# Patient Record
Sex: Male | Born: 1965
Health system: Southern US, Community
[De-identification: ages and names within clinical notes are randomized; demographics above are authoritative.]

## PROBLEM LIST (undated history)

## (undated) DIAGNOSIS — C8190 Hodgkin lymphoma, unspecified, unspecified site: Secondary | ICD-10-CM

## (undated) DIAGNOSIS — I2510 Atherosclerotic heart disease of native coronary artery without angina pectoris: Secondary | ICD-10-CM

## (undated) DIAGNOSIS — E785 Hyperlipidemia, unspecified: Secondary | ICD-10-CM

## (undated) DIAGNOSIS — I493 Ventricular premature depolarization: Secondary | ICD-10-CM

## (undated) DIAGNOSIS — H3530 Unspecified macular degeneration: Secondary | ICD-10-CM

## (undated) HISTORY — DX: Unspecified macular degeneration: H35.30

## (undated) HISTORY — DX: Ventricular premature depolarization: I49.3

## (undated) HISTORY — DX: Hyperlipidemia, unspecified: E78.5

## (undated) HISTORY — DX: Hodgkin lymphoma, unspecified, unspecified site: C81.90

## (undated) HISTORY — DX: Atherosclerotic heart disease of native coronary artery without angina pectoris: I25.10

---

## 1995-07-13 DIAGNOSIS — Z8571 Personal history of Hodgkin lymphoma: Secondary | ICD-10-CM | POA: Insufficient documentation

## 1995-07-13 DIAGNOSIS — C8190 Hodgkin lymphoma, unspecified, unspecified site: Secondary | ICD-10-CM

## 1995-07-13 HISTORY — DX: Hodgkin lymphoma, unspecified, unspecified site: C81.90

## 2005-01-06 ENCOUNTER — Ambulatory Visit: Payer: Self-pay | Admitting: Hematology & Oncology

## 2009-08-12 HISTORY — PX: CARDIAC CATHETERIZATION: SHX172

## 2012-09-06 ENCOUNTER — Encounter (INDEPENDENT_AMBULATORY_CARE_PROVIDER_SITE_OTHER): Payer: BC Managed Care – PPO | Admitting: Ophthalmology

## 2012-09-06 DIAGNOSIS — H32 Chorioretinal disorders in diseases classified elsewhere: Secondary | ICD-10-CM

## 2012-09-06 DIAGNOSIS — H43819 Vitreous degeneration, unspecified eye: Secondary | ICD-10-CM

## 2012-09-06 DIAGNOSIS — H353290 Exudative age-related macular degeneration, unspecified eye, stage unspecified: Secondary | ICD-10-CM

## 2012-09-06 DIAGNOSIS — H2510 Age-related nuclear cataract, unspecified eye: Secondary | ICD-10-CM

## 2012-09-06 DIAGNOSIS — H3530 Unspecified macular degeneration: Secondary | ICD-10-CM

## 2012-09-13 ENCOUNTER — Encounter (INDEPENDENT_AMBULATORY_CARE_PROVIDER_SITE_OTHER): Payer: BC Managed Care – PPO | Admitting: Ophthalmology

## 2012-09-13 DIAGNOSIS — H35059 Retinal neovascularization, unspecified, unspecified eye: Secondary | ICD-10-CM

## 2012-09-13 DIAGNOSIS — B399 Histoplasmosis, unspecified: Secondary | ICD-10-CM

## 2012-09-13 DIAGNOSIS — H3530 Unspecified macular degeneration: Secondary | ICD-10-CM

## 2012-10-02 ENCOUNTER — Encounter (INDEPENDENT_AMBULATORY_CARE_PROVIDER_SITE_OTHER): Payer: BC Managed Care – PPO | Admitting: Ophthalmology

## 2012-10-02 DIAGNOSIS — H353290 Exudative age-related macular degeneration, unspecified eye, stage unspecified: Secondary | ICD-10-CM

## 2012-10-02 DIAGNOSIS — H3530 Unspecified macular degeneration: Secondary | ICD-10-CM

## 2012-10-02 DIAGNOSIS — B399 Histoplasmosis, unspecified: Secondary | ICD-10-CM

## 2012-10-02 DIAGNOSIS — H43819 Vitreous degeneration, unspecified eye: Secondary | ICD-10-CM

## 2012-10-31 ENCOUNTER — Encounter (INDEPENDENT_AMBULATORY_CARE_PROVIDER_SITE_OTHER): Payer: BC Managed Care – PPO | Admitting: Ophthalmology

## 2012-10-31 DIAGNOSIS — H353290 Exudative age-related macular degeneration, unspecified eye, stage unspecified: Secondary | ICD-10-CM

## 2012-10-31 DIAGNOSIS — B399 Histoplasmosis, unspecified: Secondary | ICD-10-CM

## 2012-10-31 DIAGNOSIS — H43819 Vitreous degeneration, unspecified eye: Secondary | ICD-10-CM

## 2012-10-31 DIAGNOSIS — H3530 Unspecified macular degeneration: Secondary | ICD-10-CM

## 2012-11-28 ENCOUNTER — Encounter (INDEPENDENT_AMBULATORY_CARE_PROVIDER_SITE_OTHER): Payer: BC Managed Care – PPO | Admitting: Ophthalmology

## 2012-11-28 DIAGNOSIS — H43819 Vitreous degeneration, unspecified eye: Secondary | ICD-10-CM

## 2012-11-28 DIAGNOSIS — H3530 Unspecified macular degeneration: Secondary | ICD-10-CM

## 2012-11-28 DIAGNOSIS — B399 Histoplasmosis, unspecified: Secondary | ICD-10-CM

## 2012-11-28 DIAGNOSIS — H353290 Exudative age-related macular degeneration, unspecified eye, stage unspecified: Secondary | ICD-10-CM

## 2012-12-19 ENCOUNTER — Encounter (INDEPENDENT_AMBULATORY_CARE_PROVIDER_SITE_OTHER): Payer: BC Managed Care – PPO | Admitting: Ophthalmology

## 2012-12-19 DIAGNOSIS — H353290 Exudative age-related macular degeneration, unspecified eye, stage unspecified: Secondary | ICD-10-CM

## 2012-12-19 DIAGNOSIS — H32 Chorioretinal disorders in diseases classified elsewhere: Secondary | ICD-10-CM

## 2012-12-19 DIAGNOSIS — H35039 Hypertensive retinopathy, unspecified eye: Secondary | ICD-10-CM

## 2012-12-19 DIAGNOSIS — H3530 Unspecified macular degeneration: Secondary | ICD-10-CM

## 2012-12-19 DIAGNOSIS — I10 Essential (primary) hypertension: Secondary | ICD-10-CM

## 2013-01-11 ENCOUNTER — Encounter (INDEPENDENT_AMBULATORY_CARE_PROVIDER_SITE_OTHER): Payer: BC Managed Care – PPO | Admitting: Ophthalmology

## 2013-01-11 DIAGNOSIS — H3530 Unspecified macular degeneration: Secondary | ICD-10-CM

## 2013-01-11 DIAGNOSIS — H353290 Exudative age-related macular degeneration, unspecified eye, stage unspecified: Secondary | ICD-10-CM

## 2013-01-11 DIAGNOSIS — B399 Histoplasmosis, unspecified: Secondary | ICD-10-CM

## 2013-01-11 DIAGNOSIS — H43819 Vitreous degeneration, unspecified eye: Secondary | ICD-10-CM

## 2013-01-31 ENCOUNTER — Encounter (INDEPENDENT_AMBULATORY_CARE_PROVIDER_SITE_OTHER): Payer: BC Managed Care – PPO | Admitting: Ophthalmology

## 2013-01-31 DIAGNOSIS — H32 Chorioretinal disorders in diseases classified elsewhere: Secondary | ICD-10-CM

## 2013-01-31 DIAGNOSIS — H353290 Exudative age-related macular degeneration, unspecified eye, stage unspecified: Secondary | ICD-10-CM

## 2013-01-31 DIAGNOSIS — H43819 Vitreous degeneration, unspecified eye: Secondary | ICD-10-CM

## 2013-01-31 DIAGNOSIS — H3530 Unspecified macular degeneration: Secondary | ICD-10-CM

## 2013-02-28 ENCOUNTER — Encounter (INDEPENDENT_AMBULATORY_CARE_PROVIDER_SITE_OTHER): Payer: BC Managed Care – PPO | Admitting: Ophthalmology

## 2013-03-05 ENCOUNTER — Encounter (INDEPENDENT_AMBULATORY_CARE_PROVIDER_SITE_OTHER): Payer: BC Managed Care – PPO | Admitting: Ophthalmology

## 2013-03-05 DIAGNOSIS — H35039 Hypertensive retinopathy, unspecified eye: Secondary | ICD-10-CM

## 2013-03-05 DIAGNOSIS — H2510 Age-related nuclear cataract, unspecified eye: Secondary | ICD-10-CM

## 2013-03-05 DIAGNOSIS — H353290 Exudative age-related macular degeneration, unspecified eye, stage unspecified: Secondary | ICD-10-CM

## 2013-03-05 DIAGNOSIS — H3530 Unspecified macular degeneration: Secondary | ICD-10-CM

## 2013-03-05 DIAGNOSIS — H43819 Vitreous degeneration, unspecified eye: Secondary | ICD-10-CM

## 2013-03-05 DIAGNOSIS — I10 Essential (primary) hypertension: Secondary | ICD-10-CM

## 2013-04-02 ENCOUNTER — Encounter (INDEPENDENT_AMBULATORY_CARE_PROVIDER_SITE_OTHER): Payer: BC Managed Care – PPO | Admitting: Ophthalmology

## 2013-04-02 DIAGNOSIS — H43819 Vitreous degeneration, unspecified eye: Secondary | ICD-10-CM

## 2013-04-02 DIAGNOSIS — H3530 Unspecified macular degeneration: Secondary | ICD-10-CM

## 2013-04-02 DIAGNOSIS — B399 Histoplasmosis, unspecified: Secondary | ICD-10-CM

## 2013-04-02 DIAGNOSIS — I10 Essential (primary) hypertension: Secondary | ICD-10-CM

## 2013-04-02 DIAGNOSIS — H35039 Hypertensive retinopathy, unspecified eye: Secondary | ICD-10-CM

## 2013-04-02 DIAGNOSIS — H353290 Exudative age-related macular degeneration, unspecified eye, stage unspecified: Secondary | ICD-10-CM

## 2013-04-30 ENCOUNTER — Encounter (INDEPENDENT_AMBULATORY_CARE_PROVIDER_SITE_OTHER): Payer: BC Managed Care – PPO | Admitting: Ophthalmology

## 2013-04-30 DIAGNOSIS — I10 Essential (primary) hypertension: Secondary | ICD-10-CM

## 2013-04-30 DIAGNOSIS — H43819 Vitreous degeneration, unspecified eye: Secondary | ICD-10-CM

## 2013-04-30 DIAGNOSIS — H353290 Exudative age-related macular degeneration, unspecified eye, stage unspecified: Secondary | ICD-10-CM

## 2013-04-30 DIAGNOSIS — H2510 Age-related nuclear cataract, unspecified eye: Secondary | ICD-10-CM

## 2013-04-30 DIAGNOSIS — H35039 Hypertensive retinopathy, unspecified eye: Secondary | ICD-10-CM

## 2013-04-30 DIAGNOSIS — H3530 Unspecified macular degeneration: Secondary | ICD-10-CM

## 2013-04-30 DIAGNOSIS — B399 Histoplasmosis, unspecified: Secondary | ICD-10-CM

## 2013-05-28 ENCOUNTER — Encounter (INDEPENDENT_AMBULATORY_CARE_PROVIDER_SITE_OTHER): Payer: BC Managed Care – PPO | Admitting: Ophthalmology

## 2013-05-28 DIAGNOSIS — B399 Histoplasmosis, unspecified: Secondary | ICD-10-CM

## 2013-05-28 DIAGNOSIS — I10 Essential (primary) hypertension: Secondary | ICD-10-CM

## 2013-05-28 DIAGNOSIS — H3530 Unspecified macular degeneration: Secondary | ICD-10-CM

## 2013-05-28 DIAGNOSIS — H35039 Hypertensive retinopathy, unspecified eye: Secondary | ICD-10-CM

## 2013-05-28 DIAGNOSIS — H43819 Vitreous degeneration, unspecified eye: Secondary | ICD-10-CM

## 2013-05-28 DIAGNOSIS — H353290 Exudative age-related macular degeneration, unspecified eye, stage unspecified: Secondary | ICD-10-CM

## 2013-06-22 ENCOUNTER — Encounter (INDEPENDENT_AMBULATORY_CARE_PROVIDER_SITE_OTHER): Payer: BC Managed Care – PPO | Admitting: Ophthalmology

## 2013-06-22 DIAGNOSIS — B399 Histoplasmosis, unspecified: Secondary | ICD-10-CM

## 2013-06-22 DIAGNOSIS — H43819 Vitreous degeneration, unspecified eye: Secondary | ICD-10-CM

## 2013-06-22 DIAGNOSIS — H35039 Hypertensive retinopathy, unspecified eye: Secondary | ICD-10-CM

## 2013-06-22 DIAGNOSIS — H3530 Unspecified macular degeneration: Secondary | ICD-10-CM

## 2013-06-22 DIAGNOSIS — H353290 Exudative age-related macular degeneration, unspecified eye, stage unspecified: Secondary | ICD-10-CM

## 2013-06-22 DIAGNOSIS — I10 Essential (primary) hypertension: Secondary | ICD-10-CM

## 2013-07-20 ENCOUNTER — Encounter (INDEPENDENT_AMBULATORY_CARE_PROVIDER_SITE_OTHER): Payer: BC Managed Care – PPO | Admitting: Ophthalmology

## 2013-07-20 DIAGNOSIS — H3530 Unspecified macular degeneration: Secondary | ICD-10-CM

## 2013-07-20 DIAGNOSIS — H2510 Age-related nuclear cataract, unspecified eye: Secondary | ICD-10-CM

## 2013-07-20 DIAGNOSIS — B399 Histoplasmosis, unspecified: Secondary | ICD-10-CM

## 2013-07-20 DIAGNOSIS — H353190 Nonexudative age-related macular degeneration, unspecified eye, stage unspecified: Secondary | ICD-10-CM

## 2013-07-20 DIAGNOSIS — H43819 Vitreous degeneration, unspecified eye: Secondary | ICD-10-CM

## 2013-07-20 DIAGNOSIS — H32 Chorioretinal disorders in diseases classified elsewhere: Secondary | ICD-10-CM

## 2013-08-17 ENCOUNTER — Encounter (INDEPENDENT_AMBULATORY_CARE_PROVIDER_SITE_OTHER): Payer: BC Managed Care – PPO | Admitting: Ophthalmology

## 2013-08-17 DIAGNOSIS — H3530 Unspecified macular degeneration: Secondary | ICD-10-CM

## 2013-08-17 DIAGNOSIS — H2510 Age-related nuclear cataract, unspecified eye: Secondary | ICD-10-CM

## 2013-08-17 DIAGNOSIS — I10 Essential (primary) hypertension: Secondary | ICD-10-CM

## 2013-08-17 DIAGNOSIS — H43819 Vitreous degeneration, unspecified eye: Secondary | ICD-10-CM

## 2013-08-17 DIAGNOSIS — H353290 Exudative age-related macular degeneration, unspecified eye, stage unspecified: Secondary | ICD-10-CM

## 2013-08-17 DIAGNOSIS — H35039 Hypertensive retinopathy, unspecified eye: Secondary | ICD-10-CM

## 2013-09-21 ENCOUNTER — Encounter (INDEPENDENT_AMBULATORY_CARE_PROVIDER_SITE_OTHER): Payer: BC Managed Care – PPO | Admitting: Ophthalmology

## 2013-09-21 DIAGNOSIS — H32 Chorioretinal disorders in diseases classified elsewhere: Secondary | ICD-10-CM

## 2013-09-21 DIAGNOSIS — B399 Histoplasmosis, unspecified: Secondary | ICD-10-CM

## 2013-09-21 DIAGNOSIS — H3530 Unspecified macular degeneration: Secondary | ICD-10-CM

## 2013-09-21 DIAGNOSIS — H43819 Vitreous degeneration, unspecified eye: Secondary | ICD-10-CM

## 2013-09-21 DIAGNOSIS — H353290 Exudative age-related macular degeneration, unspecified eye, stage unspecified: Secondary | ICD-10-CM

## 2013-09-21 DIAGNOSIS — H2510 Age-related nuclear cataract, unspecified eye: Secondary | ICD-10-CM

## 2013-10-26 ENCOUNTER — Encounter (INDEPENDENT_AMBULATORY_CARE_PROVIDER_SITE_OTHER): Payer: BC Managed Care – PPO | Admitting: Ophthalmology

## 2013-10-26 DIAGNOSIS — H3530 Unspecified macular degeneration: Secondary | ICD-10-CM

## 2013-10-26 DIAGNOSIS — B399 Histoplasmosis, unspecified: Secondary | ICD-10-CM

## 2013-10-26 DIAGNOSIS — H43819 Vitreous degeneration, unspecified eye: Secondary | ICD-10-CM

## 2013-10-26 DIAGNOSIS — H32 Chorioretinal disorders in diseases classified elsewhere: Secondary | ICD-10-CM

## 2013-10-26 DIAGNOSIS — H353290 Exudative age-related macular degeneration, unspecified eye, stage unspecified: Secondary | ICD-10-CM

## 2013-10-26 DIAGNOSIS — H2510 Age-related nuclear cataract, unspecified eye: Secondary | ICD-10-CM

## 2013-11-29 ENCOUNTER — Encounter (INDEPENDENT_AMBULATORY_CARE_PROVIDER_SITE_OTHER): Payer: BC Managed Care – PPO | Admitting: Ophthalmology

## 2013-11-29 DIAGNOSIS — H32 Chorioretinal disorders in diseases classified elsewhere: Secondary | ICD-10-CM

## 2013-11-29 DIAGNOSIS — H35039 Hypertensive retinopathy, unspecified eye: Secondary | ICD-10-CM

## 2013-11-29 DIAGNOSIS — H43819 Vitreous degeneration, unspecified eye: Secondary | ICD-10-CM

## 2013-11-29 DIAGNOSIS — B399 Histoplasmosis, unspecified: Secondary | ICD-10-CM

## 2013-11-29 DIAGNOSIS — H353290 Exudative age-related macular degeneration, unspecified eye, stage unspecified: Secondary | ICD-10-CM

## 2013-11-29 DIAGNOSIS — I10 Essential (primary) hypertension: Secondary | ICD-10-CM

## 2013-11-29 DIAGNOSIS — H3530 Unspecified macular degeneration: Secondary | ICD-10-CM

## 2014-01-04 ENCOUNTER — Encounter (INDEPENDENT_AMBULATORY_CARE_PROVIDER_SITE_OTHER): Payer: BC Managed Care – PPO | Admitting: Ophthalmology

## 2014-01-04 DIAGNOSIS — H43819 Vitreous degeneration, unspecified eye: Secondary | ICD-10-CM

## 2014-01-04 DIAGNOSIS — H32 Chorioretinal disorders in diseases classified elsewhere: Secondary | ICD-10-CM

## 2014-01-04 DIAGNOSIS — H353290 Exudative age-related macular degeneration, unspecified eye, stage unspecified: Secondary | ICD-10-CM

## 2014-01-04 DIAGNOSIS — H3530 Unspecified macular degeneration: Secondary | ICD-10-CM

## 2014-01-04 DIAGNOSIS — B399 Histoplasmosis, unspecified: Secondary | ICD-10-CM

## 2014-02-01 ENCOUNTER — Encounter (INDEPENDENT_AMBULATORY_CARE_PROVIDER_SITE_OTHER): Payer: BC Managed Care – PPO | Admitting: Ophthalmology

## 2014-02-01 DIAGNOSIS — H353290 Exudative age-related macular degeneration, unspecified eye, stage unspecified: Secondary | ICD-10-CM

## 2014-02-01 DIAGNOSIS — B399 Histoplasmosis, unspecified: Secondary | ICD-10-CM

## 2014-02-01 DIAGNOSIS — H32 Chorioretinal disorders in diseases classified elsewhere: Secondary | ICD-10-CM

## 2014-02-01 DIAGNOSIS — H43819 Vitreous degeneration, unspecified eye: Secondary | ICD-10-CM

## 2014-02-01 DIAGNOSIS — H3530 Unspecified macular degeneration: Secondary | ICD-10-CM

## 2014-02-09 HISTORY — PX: CORONARY ARTERY BYPASS GRAFT: SHX141

## 2014-02-26 ENCOUNTER — Encounter (INDEPENDENT_AMBULATORY_CARE_PROVIDER_SITE_OTHER): Payer: BC Managed Care – PPO | Admitting: Ophthalmology

## 2014-02-26 DIAGNOSIS — H353290 Exudative age-related macular degeneration, unspecified eye, stage unspecified: Secondary | ICD-10-CM

## 2014-02-26 DIAGNOSIS — H32 Chorioretinal disorders in diseases classified elsewhere: Secondary | ICD-10-CM

## 2014-02-26 DIAGNOSIS — H35039 Hypertensive retinopathy, unspecified eye: Secondary | ICD-10-CM

## 2014-02-26 DIAGNOSIS — H3530 Unspecified macular degeneration: Secondary | ICD-10-CM

## 2014-02-26 DIAGNOSIS — I10 Essential (primary) hypertension: Secondary | ICD-10-CM

## 2014-02-26 DIAGNOSIS — B399 Histoplasmosis, unspecified: Secondary | ICD-10-CM

## 2014-02-28 ENCOUNTER — Encounter (INDEPENDENT_AMBULATORY_CARE_PROVIDER_SITE_OTHER): Payer: BC Managed Care – PPO | Admitting: Ophthalmology

## 2014-04-02 ENCOUNTER — Encounter (INDEPENDENT_AMBULATORY_CARE_PROVIDER_SITE_OTHER): Payer: BC Managed Care – PPO | Admitting: Ophthalmology

## 2014-04-02 DIAGNOSIS — I10 Essential (primary) hypertension: Secondary | ICD-10-CM

## 2014-04-02 DIAGNOSIS — H3530 Unspecified macular degeneration: Secondary | ICD-10-CM

## 2014-04-02 DIAGNOSIS — H35039 Hypertensive retinopathy, unspecified eye: Secondary | ICD-10-CM

## 2014-04-02 DIAGNOSIS — B399 Histoplasmosis, unspecified: Secondary | ICD-10-CM

## 2014-04-02 DIAGNOSIS — H32 Chorioretinal disorders in diseases classified elsewhere: Secondary | ICD-10-CM

## 2014-04-02 DIAGNOSIS — H43819 Vitreous degeneration, unspecified eye: Secondary | ICD-10-CM

## 2014-04-02 DIAGNOSIS — H353290 Exudative age-related macular degeneration, unspecified eye, stage unspecified: Secondary | ICD-10-CM

## 2014-05-07 ENCOUNTER — Encounter (INDEPENDENT_AMBULATORY_CARE_PROVIDER_SITE_OTHER): Payer: BC Managed Care – PPO | Admitting: Ophthalmology

## 2014-05-07 DIAGNOSIS — H3532 Exudative age-related macular degeneration: Secondary | ICD-10-CM

## 2014-05-07 DIAGNOSIS — H35033 Hypertensive retinopathy, bilateral: Secondary | ICD-10-CM

## 2014-05-07 DIAGNOSIS — H3531 Nonexudative age-related macular degeneration: Secondary | ICD-10-CM

## 2014-05-07 DIAGNOSIS — B399 Histoplasmosis, unspecified: Secondary | ICD-10-CM

## 2014-05-07 DIAGNOSIS — H43813 Vitreous degeneration, bilateral: Secondary | ICD-10-CM

## 2014-05-07 DIAGNOSIS — H32 Chorioretinal disorders in diseases classified elsewhere: Secondary | ICD-10-CM

## 2014-05-08 ENCOUNTER — Encounter (INDEPENDENT_AMBULATORY_CARE_PROVIDER_SITE_OTHER): Payer: BC Managed Care – PPO | Admitting: Ophthalmology

## 2014-05-23 ENCOUNTER — Telehealth: Payer: Self-pay | Admitting: Cardiovascular Disease

## 2014-05-23 NOTE — Telephone Encounter (Signed)
Received records from [facility] for appointment with Dr Gwenlyn Found on 05/24/14,  Records given to [facility] (medical records) for Dr Kennon Holter schedule on 05/24/14.  lp

## 2014-05-24 ENCOUNTER — Ambulatory Visit (INDEPENDENT_AMBULATORY_CARE_PROVIDER_SITE_OTHER): Payer: BC Managed Care – PPO | Admitting: Cardiovascular Disease

## 2014-05-24 ENCOUNTER — Encounter: Payer: Self-pay | Admitting: Cardiovascular Disease

## 2014-05-24 VITALS — BP 102/78 | HR 75 | Ht 68.0 in | Wt 165.0 lb

## 2014-05-24 DIAGNOSIS — E782 Mixed hyperlipidemia: Secondary | ICD-10-CM | POA: Insufficient documentation

## 2014-05-24 DIAGNOSIS — I25810 Atherosclerosis of coronary artery bypass graft(s) without angina pectoris: Secondary | ICD-10-CM | POA: Insufficient documentation

## 2014-05-24 DIAGNOSIS — I25700 Atherosclerosis of coronary artery bypass graft(s), unspecified, with unstable angina pectoris: Secondary | ICD-10-CM

## 2014-05-24 DIAGNOSIS — Z8249 Family history of ischemic heart disease and other diseases of the circulatory system: Secondary | ICD-10-CM

## 2014-05-24 DIAGNOSIS — E785 Hyperlipidemia, unspecified: Secondary | ICD-10-CM

## 2014-05-24 DIAGNOSIS — I2510 Atherosclerotic heart disease of native coronary artery without angina pectoris: Secondary | ICD-10-CM

## 2014-05-24 MED ORDER — CARVEDILOL 6.25 MG PO TABS: 6.2500 mg | ORAL_TABLET | Freq: Two times a day (BID) | ORAL | Status: DC

## 2014-05-24 NOTE — Progress Notes (Signed)
05/24/2014 BURK HOCTOR   Aug 19, 1965  [id number]  Primary Physician Enid Skeens., MD Primary Cardiologist: Lorretta Harp MD Renae Gloss   HPI:  Mr. Vanderveer is a 48 year old thin appearing divorced Caucasian male father of 2 children who is accompanied by his friend Murlean Hark.he works as an Sales promotion account executive. His primary care physician is Dr. Cecille Amsterdam. His cardiac risk factor profile is remarkable for hyperlipidemia and family history the father who had bypass surgery at age 82 and a brother who had bypass surgery as well. He suffered an anterior wall myocardial infarction 08/28/09 with a Xience DES stent placed in his proximal LAD. Because of recurrent symptoms he underwent catheterization in August and ultimately coronary bypass grafting 02/27/14 with a LIMA to his LAD, a vein to ramus branch, obtuse marginal branch and the RCA. His ejection fraction was 35%. He was reported nicely though did not participate in cardiac rehabilitation.   Current Outpatient Prescriptions  Medication Sig Dispense Refill  . aspirin 81 MG tablet Take 81 mg by mouth daily.    Marland Kitchen BESIVANCE 0.6 % SUSP as needed.   1  . carvedilol (COREG) 6.25 MG tablet Take 1 tablet (6.25 mg total) by mouth 2 (two) times daily. 60 tablet 6  . CRESTOR 20 MG tablet Take 10 mg by mouth daily.  0  . zolpidem (AMBIEN) 10 MG tablet Take 10 mg by mouth at bedtime as needed.   0   No current facility-administered medications for this visit.    No Known Allergies  History   Social History  . Marital Status: Single    Spouse Name: N/A    Number of Children: N/A  . Years of Education: N/A   Occupational History  . Not on file.   Social History Main Topics  . Smoking status: Never Smoker   . Smokeless tobacco: Not on file  . Alcohol Use: Not on file  . Drug Use: Not on file  . Sexual Activity: Not on file   Other Topics Concern  . Not on file   Social History Narrative  . No narrative  on file     Review of Systems: General: negative for chills, fever, night sweats or weight changes.  Cardiovascular: negative for chest pain, dyspnea on exertion, edema, orthopnea, palpitations, paroxysmal nocturnal dyspnea or shortness of breath Dermatological: negative for rash Respiratory: negative for cough or wheezing Urologic: negative for hematuria Abdominal: negative for nausea, vomiting, diarrhea, bright red blood per rectum, melena, or hematemesis Neurologic: negative for visual changes, syncope, or dizziness All other systems reviewed and are otherwise negative except as noted above.    Blood pressure 102/78, pulse 75, height 5\' 8"  (1.727 m), weight 165 lb (74.844 kg).  General appearance: alert and no distress Neck: no adenopathy, no carotid bruit, no JVD, supple, symmetrical, trachea midline and thyroid not enlarged, symmetric, no tenderness/mass/nodules Lungs: clear to auscultation bilaterally Heart: regular rate and rhythm, S1, S2 normal, no murmur, click, rub or gallop Extremities: extremities normal, atraumatic, no cyanosis or edema and 2+ pedal pulses  EKG sinus rhythm at 75 with septal Q waves. I personally reviewed the EKG  ASSESSMENT AND PLAN:   CAD (coronary artery disease) of artery bypass graft History of CAD status post acute anterior wall myocardial infarction 08/28/09 status post stenting with a drug-eluting stent (Xience) . His EF was 35% by 2-D echo. with otherwise no significant CAD. He recently underwent cardiac catheterization in August and was found to have  surgical anatomy and underwent coronary artery bypass grafting with 4 grafts, with a LIMA to the LAD, vein graft to a ramus branch, circumflex marginal and RCA. He did not participate with cardiac rehabilitation but has recovered well. I'm going to increase his carvedilol from 3.125 mg twice a day to 6.25 mg twice a day and get a 2-D echocardiogram for LV function.  Hyperlipidemia On Crestor 20 mg with  a recent LDL of 51. Continue current medication      Lorretta Harp MD [facility], [facility] 05/24/2014 3:40 PM

## 2014-05-24 NOTE — Assessment & Plan Note (Signed)
On Crestor 20 mg with a recent LDL of 51. Continue current medication

## 2014-05-24 NOTE — Patient Instructions (Addendum)
Please INCREASE Coreg to 6.25mg  twice daily.  Dr. Gwenlyn Found has ordered a  2D Echocardiogram    Echocardiogram. Echocardiography is a painless test that uses sound waves to create images of your heart. It provides your doctor with information about the size and shape of your heart and how well your heart's chambers and valves are working. This procedure takes approximately one hour. There are no restrictions for this procedure.   Your physician wants you to follow-up in 6 months with Dr. Gwenlyn Found.. You will receive a reminder letter in the mail 2 months in advance. If you do not receive a letter, please call our office to schedule the follow-up appointment.  Dr. Gwenlyn Found has referred you to Estelle Grumbles, a dietician. Her office will contact you for an appointment.

## 2014-05-24 NOTE — Assessment & Plan Note (Signed)
History of CAD status post acute anterior wall myocardial infarction 08/28/09 status post stenting with a drug-eluting stent (Xience) . His EF was 35% by 2-D echo. with otherwise no significant CAD. He recently underwent cardiac catheterization in August and was found to have surgical anatomy and underwent coronary artery bypass grafting with 4 grafts, with a LIMA to the LAD, vein graft to a ramus branch, circumflex marginal and RCA. He did not participate with cardiac rehabilitation but has recovered well. I'm going to increase his carvedilol from 3.125 mg twice a day to 6.25 mg twice a day and get a 2-D echocardiogram for LV function.

## 2014-05-29 ENCOUNTER — Ambulatory Visit (HOSPITAL_COMMUNITY)
Admission: RE | Admit: 2014-05-29 | Discharge: 2014-05-29 | Disposition: A | Discharge status: Home / Self Care | Payer: BC Managed Care – PPO | Source: Ambulatory Visit | Attending: Cardiovascular Disease | Admitting: Cardiovascular Disease

## 2014-05-29 DIAGNOSIS — I2510 Atherosclerotic heart disease of native coronary artery without angina pectoris: Secondary | ICD-10-CM | POA: Insufficient documentation

## 2014-05-29 DIAGNOSIS — I059 Rheumatic mitral valve disease, unspecified: Secondary | ICD-10-CM

## 2014-05-29 DIAGNOSIS — I25700 Atherosclerosis of coronary artery bypass graft(s), unspecified, with unstable angina pectoris: Secondary | ICD-10-CM

## 2014-05-29 NOTE — Progress Notes (Signed)
2D Echo Performed 05/29/2014    Marygrace Drought, RCS

## 2014-06-04 ENCOUNTER — Telehealth: Payer: Self-pay | Admitting: *Deleted

## 2014-06-04 DIAGNOSIS — I519 Heart disease, unspecified: Secondary | ICD-10-CM

## 2014-06-04 NOTE — Telephone Encounter (Signed)
Spoke to pt about recent 2D echo. Told him results and that the plan of care is to repeat the Echo in 3 months. At last office visit pts medication was increased as well. Order was placed for 2D Echo in 3 months. Pt voiced understanding of results and plan of care.

## 2014-06-11 ENCOUNTER — Encounter (INDEPENDENT_AMBULATORY_CARE_PROVIDER_SITE_OTHER): Payer: BC Managed Care – PPO | Admitting: Ophthalmology

## 2014-06-11 ENCOUNTER — Telehealth (HOSPITAL_COMMUNITY): Payer: Self-pay | Admitting: *Deleted

## 2014-06-11 DIAGNOSIS — H3531 Nonexudative age-related macular degeneration: Secondary | ICD-10-CM

## 2014-06-11 DIAGNOSIS — H35033 Hypertensive retinopathy, bilateral: Secondary | ICD-10-CM

## 2014-06-11 DIAGNOSIS — I10 Essential (primary) hypertension: Secondary | ICD-10-CM

## 2014-06-11 DIAGNOSIS — H43813 Vitreous degeneration, bilateral: Secondary | ICD-10-CM

## 2014-06-11 DIAGNOSIS — H3532 Exudative age-related macular degeneration: Secondary | ICD-10-CM

## 2014-06-11 NOTE — Telephone Encounter (Signed)
Pt would like his echo results faxed to him Please call

## 2014-07-03 NOTE — Telephone Encounter (Signed)
Notified her that the results would be mailed to pt.

## 2014-07-03 NOTE — Telephone Encounter (Signed)
Please fax echo results to [phone]

## 2014-07-23 ENCOUNTER — Encounter (INDEPENDENT_AMBULATORY_CARE_PROVIDER_SITE_OTHER): Payer: Medicare HMO | Admitting: Ophthalmology

## 2014-07-23 DIAGNOSIS — H43813 Vitreous degeneration, bilateral: Secondary | ICD-10-CM

## 2014-07-23 DIAGNOSIS — B399 Histoplasmosis, unspecified: Secondary | ICD-10-CM

## 2014-07-23 DIAGNOSIS — H3532 Exudative age-related macular degeneration: Secondary | ICD-10-CM

## 2014-07-23 DIAGNOSIS — H3531 Nonexudative age-related macular degeneration: Secondary | ICD-10-CM

## 2014-07-23 DIAGNOSIS — H2513 Age-related nuclear cataract, bilateral: Secondary | ICD-10-CM

## 2014-07-23 DIAGNOSIS — H32 Chorioretinal disorders in diseases classified elsewhere: Secondary | ICD-10-CM

## 2014-08-27 ENCOUNTER — Ambulatory Visit (HOSPITAL_COMMUNITY)
Admission: RE | Admit: 2014-08-27 | Discharge: 2014-08-27 | Disposition: A | Discharge status: Home / Self Care | Payer: 59 | Source: Ambulatory Visit | Attending: Internal Medicine | Admitting: Internal Medicine

## 2014-08-27 ENCOUNTER — Encounter (INDEPENDENT_AMBULATORY_CARE_PROVIDER_SITE_OTHER): Payer: Medicare HMO | Admitting: Ophthalmology

## 2014-08-27 DIAGNOSIS — I519 Heart disease, unspecified: Secondary | ICD-10-CM | POA: Diagnosis present

## 2014-08-27 DIAGNOSIS — E785 Hyperlipidemia, unspecified: Secondary | ICD-10-CM | POA: Insufficient documentation

## 2014-08-27 DIAGNOSIS — Z8249 Family history of ischemic heart disease and other diseases of the circulatory system: Secondary | ICD-10-CM | POA: Insufficient documentation

## 2014-08-27 DIAGNOSIS — H3532 Exudative age-related macular degeneration: Secondary | ICD-10-CM

## 2014-08-27 DIAGNOSIS — I10 Essential (primary) hypertension: Secondary | ICD-10-CM

## 2014-08-27 DIAGNOSIS — H43813 Vitreous degeneration, bilateral: Secondary | ICD-10-CM

## 2014-08-27 DIAGNOSIS — H2512 Age-related nuclear cataract, left eye: Secondary | ICD-10-CM

## 2014-08-27 DIAGNOSIS — I25700 Atherosclerosis of coronary artery bypass graft(s), unspecified, with unstable angina pectoris: Secondary | ICD-10-CM

## 2014-08-27 DIAGNOSIS — H3531 Nonexudative age-related macular degeneration: Secondary | ICD-10-CM

## 2014-08-27 DIAGNOSIS — H35033 Hypertensive retinopathy, bilateral: Secondary | ICD-10-CM

## 2014-08-27 NOTE — Progress Notes (Signed)
2D Limited Echocardiogram Complete for evaluation of Left Ventricular Dysfunction.  08/27/2014   Orene Abbasi [place], [place]

## 2014-09-11 ENCOUNTER — Ambulatory Visit (INDEPENDENT_AMBULATORY_CARE_PROVIDER_SITE_OTHER): Payer: 59 | Admitting: Cardiovascular Disease

## 2014-09-11 ENCOUNTER — Encounter: Payer: Self-pay | Admitting: Cardiovascular Disease

## 2014-09-11 VITALS — BP 90/72 | HR 62 | Ht 68.0 in | Wt 168.3 lb

## 2014-09-11 DIAGNOSIS — E785 Hyperlipidemia, unspecified: Secondary | ICD-10-CM

## 2014-09-11 DIAGNOSIS — Z79899 Other long term (current) drug therapy: Secondary | ICD-10-CM

## 2014-09-11 DIAGNOSIS — I493 Ventricular premature depolarization: Secondary | ICD-10-CM

## 2014-09-11 DIAGNOSIS — I25700 Atherosclerosis of coronary artery bypass graft(s), unspecified, with unstable angina pectoris: Secondary | ICD-10-CM

## 2014-09-11 MED ORDER — NITROGLYCERIN 0.4 MG SL SUBL: 0.4000 mg | SUBLINGUAL_TABLET | SUBLINGUAL | Status: DC | PRN

## 2014-09-11 NOTE — Assessment & Plan Note (Signed)
History of coronary artery disease status post and into wall myocardial infarction 08/28/09 she with a drug-eluting stent to the proximal LAD. Because of recurrent symptoms he underwent recatheterization in August of last year and ultimately coronary artery bypass grafting 02/27/14 the LIMA to his LAD, vein to ramus branch, obtuse marginal branch and the RCA. His ejection fraction that time was 35%. Follow-up 2-D echo performed in our office 08/27/14 revealed improvement in his ejection fraction up to 50-55% with hypokinesis of the anterolateral wall. He denies chest pain or shortness of breath.

## 2014-09-11 NOTE — Assessment & Plan Note (Signed)
PVCs were noted on his Toprol a left carotid gram and rhythm strip in a trigeminal pattern. He is asymptomatic from these and is already on a low-dose beta blocker

## 2014-09-11 NOTE — Assessment & Plan Note (Signed)
History of hyperlipidemia on Crestor 20 mg a day. We will recheck a lipid and liver profile 

## 2014-09-11 NOTE — Progress Notes (Signed)
09/11/2014 Shane [name]   1965/11/29  [id number]  Primary Physician Shane [name]., MD Primary Cardiologist: Shane Harp MD Shane [name]   HPI:  Shane [name] is a 49 year old thin appearing divorced Caucasian male father of 2 children who is accompanied by his friend Shane [name]. He works as an Sales promotion account executive. His primary care physician is Shane [name]. His cardiac risk factor profile is remarkable for hyperlipidemia and family history the father who had bypass surgery at age 56 and a brother who had bypass surgery as well. He suffered an anterior wall myocardial infarction 08/28/09 with a Xience DES stent placed in his proximal LAD. Because of recurrent symptoms he underwent catheterization in August and ultimately coronary bypass grafting 02/27/14 with a LIMA to his LAD, a vein to ramus branch, obtuse marginal branch and the RCA. His ejection fraction was 35%. He recuperated nicely although he did not participate in cardiac rehabilitation. A 2-D echocardiogram performed 08/27/14 revealed an improvement in his ejection fraction up to 50-55% with anterolateral wall motion abnormality. He is completely asymptomatic. He was noted to have PVCs on selective coronary gram today.   Current Outpatient Prescriptions  Medication Sig Dispense Refill  . aspirin 81 MG tablet Take 81 mg by mouth daily.    Marland Kitchen BESIVANCE 0.6 % SUSP as needed.   1  . carvedilol (COREG) 6.25 MG tablet Take 1 tablet (6.25 mg total) by mouth 2 (two) times daily. 60 tablet 6  . CRESTOR 20 MG tablet Take 10 mg by mouth daily.  0  . ibuprofen (ADVIL,MOTRIN) 200 MG tablet Take 200 mg by mouth every 6 (six) hours as needed.    . zolpidem (AMBIEN) 10 MG tablet Take 10 mg by mouth at bedtime as needed.   0  . nitroGLYCERIN (NITROSTAT) 0.4 MG SL tablet Place 1 tablet (0.4 mg total) under the tongue every 5 (five) minutes as needed for chest pain. 15 tablet 6   No current facility-administered medications  for this visit.    No Known Allergies  History   Social History  . Marital Status: Single    Spouse Name: N/A  . Number of Children: N/A  . Years of Education: N/A   Occupational History  . Not on file.   Social History Main Topics  . Smoking status: Never Smoker   . Smokeless tobacco: Not on file  . Alcohol Use: Not on file  . Drug Use: Not on file  . Sexual Activity: Not on file   Other Topics Concern  . Not on file   Social History Narrative     Review of Systems: General: negative for chills, fever, night sweats or weight changes.  Cardiovascular: negative for chest pain, dyspnea on exertion, edema, orthopnea, palpitations, paroxysmal nocturnal dyspnea or shortness of breath Dermatological: negative for rash Respiratory: negative for cough or wheezing Urologic: negative for hematuria Abdominal: negative for nausea, vomiting, diarrhea, bright red blood per rectum, melena, or hematemesis Neurologic: negative for visual changes, syncope, or dizziness All other systems reviewed and are otherwise negative except as noted above.    Blood pressure 90/72, pulse 62, height 5\' 8"  (1.727 m), weight 168 lb 4.8 oz (76.34 kg).  General appearance: alert and no distress Neck: no adenopathy, no carotid bruit, no JVD, supple, symmetrical, trachea midline and thyroid not enlarged, symmetric, no tenderness/mass/nodules Lungs: clear to auscultation bilaterally Heart: regular rate and rhythm, S1, S2 normal, no murmur, click, rub or gallop Extremities: extremities normal, atraumatic, no  cyanosis or edema  EKG normal sinus rhythm at 62 with septal Q waves and trigeminal PVCs. I personally reviewed this EKG  ASSESSMENT AND PLAN:   Hyperlipidemia History of hyperlipidemia on Crestor 20 mg a day. We will recheck a lipid and liver profile   CAD (coronary artery disease) of artery bypass graft History of coronary artery disease status post and into wall myocardial infarction 08/28/09  she with a drug-eluting stent to the proximal LAD. Because of recurrent symptoms he underwent recatheterization in August of last year and ultimately coronary artery bypass grafting 02/27/14 the LIMA to his LAD, vein to ramus branch, obtuse marginal branch and the RCA. His ejection fraction that time was 35%. Follow-up 2-D echo performed in our office 08/27/14 revealed improvement in his ejection fraction up to 50-55% with hypokinesis of the anterolateral wall. He denies chest pain or shortness of breath.   PVC's (premature ventricular contractions) PVCs were noted on his Toprol a left carotid gram and rhythm strip in a trigeminal pattern. He is asymptomatic from these and is already on a low-dose beta blocker         Shane Harp MD [place], [facility] 09/11/2014 11:23 AM

## 2014-09-11 NOTE — Patient Instructions (Signed)
Your physician wants you to follow-up in 1 year with Dr. Gwenlyn Found. You will receive a reminder letter in the mail 2 months in advance. If you do not receive a letter, please call our office to schedule the follow-up appointment.  Dr. Gwenlyn Found has ordered for you to have lab work done in the next few days and you must be FASTING.

## 2014-10-01 ENCOUNTER — Encounter (INDEPENDENT_AMBULATORY_CARE_PROVIDER_SITE_OTHER): Payer: Medicare HMO | Admitting: Ophthalmology

## 2014-10-01 DIAGNOSIS — H3532 Exudative age-related macular degeneration: Secondary | ICD-10-CM

## 2014-10-01 DIAGNOSIS — I10 Essential (primary) hypertension: Secondary | ICD-10-CM

## 2014-10-01 DIAGNOSIS — H43813 Vitreous degeneration, bilateral: Secondary | ICD-10-CM

## 2014-10-01 DIAGNOSIS — H35033 Hypertensive retinopathy, bilateral: Secondary | ICD-10-CM

## 2014-10-01 DIAGNOSIS — H3531 Nonexudative age-related macular degeneration: Secondary | ICD-10-CM | POA: Diagnosis not present

## 2014-11-05 ENCOUNTER — Encounter (INDEPENDENT_AMBULATORY_CARE_PROVIDER_SITE_OTHER): Payer: Medicare HMO | Admitting: Ophthalmology

## 2014-11-05 DIAGNOSIS — I10 Essential (primary) hypertension: Secondary | ICD-10-CM | POA: Diagnosis not present

## 2014-11-05 DIAGNOSIS — H3532 Exudative age-related macular degeneration: Secondary | ICD-10-CM

## 2014-11-05 DIAGNOSIS — H43813 Vitreous degeneration, bilateral: Secondary | ICD-10-CM | POA: Diagnosis not present

## 2014-11-05 DIAGNOSIS — H3531 Nonexudative age-related macular degeneration: Secondary | ICD-10-CM | POA: Diagnosis not present

## 2014-11-05 DIAGNOSIS — H35033 Hypertensive retinopathy, bilateral: Secondary | ICD-10-CM | POA: Diagnosis not present

## 2014-12-17 ENCOUNTER — Encounter (INDEPENDENT_AMBULATORY_CARE_PROVIDER_SITE_OTHER): Payer: Medicare HMO | Admitting: Ophthalmology

## 2014-12-18 ENCOUNTER — Telehealth: Payer: Self-pay | Admitting: Cardiovascular Disease

## 2014-12-18 ENCOUNTER — Encounter (INDEPENDENT_AMBULATORY_CARE_PROVIDER_SITE_OTHER): Payer: Medicare HMO | Admitting: Ophthalmology

## 2014-12-18 DIAGNOSIS — I10 Essential (primary) hypertension: Secondary | ICD-10-CM

## 2014-12-18 DIAGNOSIS — H35033 Hypertensive retinopathy, bilateral: Secondary | ICD-10-CM

## 2014-12-18 DIAGNOSIS — H3532 Exudative age-related macular degeneration: Secondary | ICD-10-CM

## 2014-12-18 DIAGNOSIS — H43813 Vitreous degeneration, bilateral: Secondary | ICD-10-CM | POA: Diagnosis not present

## 2014-12-18 DIAGNOSIS — H2512 Age-related nuclear cataract, left eye: Secondary | ICD-10-CM

## 2014-12-18 DIAGNOSIS — H3531 Nonexudative age-related macular degeneration: Secondary | ICD-10-CM

## 2014-12-18 MED ORDER — ROSUVASTATIN CALCIUM 20 MG PO TABS: 10.0000 mg | ORAL_TABLET | Freq: Every day | ORAL | Status: DC

## 2014-12-18 NOTE — Telephone Encounter (Signed)
Rx(s) sent to pharmacy electronically. Patient notified. 

## 2014-12-18 NOTE — Telephone Encounter (Signed)
°  1. Which medications need to be refilled? Cestor 2. Which pharmacy is medication to be sent to?[facility] 3. Do they need a 30 day or 90 day supply? 30 and refills  4. Would they like a call back once the medication has been sent to the pharmacy? yes

## 2015-01-28 ENCOUNTER — Encounter (INDEPENDENT_AMBULATORY_CARE_PROVIDER_SITE_OTHER): Payer: 59 | Admitting: Ophthalmology

## 2015-01-28 DIAGNOSIS — H35033 Hypertensive retinopathy, bilateral: Secondary | ICD-10-CM | POA: Diagnosis not present

## 2015-01-28 DIAGNOSIS — H43813 Vitreous degeneration, bilateral: Secondary | ICD-10-CM

## 2015-01-28 DIAGNOSIS — H3532 Exudative age-related macular degeneration: Secondary | ICD-10-CM | POA: Diagnosis not present

## 2015-01-28 DIAGNOSIS — H3531 Nonexudative age-related macular degeneration: Secondary | ICD-10-CM | POA: Diagnosis not present

## 2015-01-28 DIAGNOSIS — I10 Essential (primary) hypertension: Secondary | ICD-10-CM | POA: Diagnosis not present

## 2015-03-11 ENCOUNTER — Encounter (INDEPENDENT_AMBULATORY_CARE_PROVIDER_SITE_OTHER): Payer: 59 | Admitting: Ophthalmology

## 2015-03-11 DIAGNOSIS — H35033 Hypertensive retinopathy, bilateral: Secondary | ICD-10-CM | POA: Diagnosis not present

## 2015-03-11 DIAGNOSIS — H3532 Exudative age-related macular degeneration: Secondary | ICD-10-CM

## 2015-03-11 DIAGNOSIS — I10 Essential (primary) hypertension: Secondary | ICD-10-CM | POA: Diagnosis not present

## 2015-03-11 DIAGNOSIS — H3531 Nonexudative age-related macular degeneration: Secondary | ICD-10-CM | POA: Diagnosis not present

## 2015-03-11 DIAGNOSIS — H43813 Vitreous degeneration, bilateral: Secondary | ICD-10-CM | POA: Diagnosis not present

## 2015-04-22 ENCOUNTER — Encounter (INDEPENDENT_AMBULATORY_CARE_PROVIDER_SITE_OTHER): Payer: 59 | Admitting: Ophthalmology

## 2015-04-22 DIAGNOSIS — I10 Essential (primary) hypertension: Secondary | ICD-10-CM

## 2015-04-22 DIAGNOSIS — H35033 Hypertensive retinopathy, bilateral: Secondary | ICD-10-CM | POA: Diagnosis not present

## 2015-04-22 DIAGNOSIS — H353221 Exudative age-related macular degeneration, left eye, with active choroidal neovascularization: Secondary | ICD-10-CM

## 2015-04-22 DIAGNOSIS — H353111 Nonexudative age-related macular degeneration, right eye, early dry stage: Secondary | ICD-10-CM | POA: Diagnosis not present

## 2015-04-22 DIAGNOSIS — H43813 Vitreous degeneration, bilateral: Secondary | ICD-10-CM

## 2015-04-22 DIAGNOSIS — B399 Histoplasmosis, unspecified: Secondary | ICD-10-CM

## 2015-06-10 ENCOUNTER — Encounter (INDEPENDENT_AMBULATORY_CARE_PROVIDER_SITE_OTHER): Payer: 59 | Admitting: Ophthalmology

## 2015-06-11 ENCOUNTER — Encounter (INDEPENDENT_AMBULATORY_CARE_PROVIDER_SITE_OTHER): Payer: 59 | Admitting: Ophthalmology

## 2015-06-11 DIAGNOSIS — H353221 Exudative age-related macular degeneration, left eye, with active choroidal neovascularization: Secondary | ICD-10-CM

## 2015-06-11 DIAGNOSIS — H43813 Vitreous degeneration, bilateral: Secondary | ICD-10-CM | POA: Diagnosis not present

## 2015-06-11 DIAGNOSIS — I10 Essential (primary) hypertension: Secondary | ICD-10-CM | POA: Diagnosis not present

## 2015-06-11 DIAGNOSIS — H353111 Nonexudative age-related macular degeneration, right eye, early dry stage: Secondary | ICD-10-CM | POA: Diagnosis not present

## 2015-06-11 DIAGNOSIS — H35033 Hypertensive retinopathy, bilateral: Secondary | ICD-10-CM

## 2015-08-06 ENCOUNTER — Encounter (INDEPENDENT_AMBULATORY_CARE_PROVIDER_SITE_OTHER): Payer: 59 | Admitting: Ophthalmology

## 2015-08-06 DIAGNOSIS — I10 Essential (primary) hypertension: Secondary | ICD-10-CM

## 2015-08-06 DIAGNOSIS — H353111 Nonexudative age-related macular degeneration, right eye, early dry stage: Secondary | ICD-10-CM | POA: Diagnosis not present

## 2015-08-06 DIAGNOSIS — H35033 Hypertensive retinopathy, bilateral: Secondary | ICD-10-CM | POA: Diagnosis not present

## 2015-08-06 DIAGNOSIS — H353221 Exudative age-related macular degeneration, left eye, with active choroidal neovascularization: Secondary | ICD-10-CM

## 2015-08-06 DIAGNOSIS — H43813 Vitreous degeneration, bilateral: Secondary | ICD-10-CM | POA: Diagnosis not present

## 2015-09-10 ENCOUNTER — Encounter: Payer: Self-pay | Admitting: Gastroenterology

## 2015-10-08 ENCOUNTER — Encounter (INDEPENDENT_AMBULATORY_CARE_PROVIDER_SITE_OTHER): Payer: 59 | Admitting: Ophthalmology

## 2015-10-08 DIAGNOSIS — H35033 Hypertensive retinopathy, bilateral: Secondary | ICD-10-CM

## 2015-10-08 DIAGNOSIS — H353111 Nonexudative age-related macular degeneration, right eye, early dry stage: Secondary | ICD-10-CM

## 2015-10-08 DIAGNOSIS — H43813 Vitreous degeneration, bilateral: Secondary | ICD-10-CM | POA: Diagnosis not present

## 2015-10-08 DIAGNOSIS — H353221 Exudative age-related macular degeneration, left eye, with active choroidal neovascularization: Secondary | ICD-10-CM | POA: Diagnosis not present

## 2015-10-08 DIAGNOSIS — H32 Chorioretinal disorders in diseases classified elsewhere: Secondary | ICD-10-CM

## 2015-10-08 DIAGNOSIS — I10 Essential (primary) hypertension: Secondary | ICD-10-CM

## 2015-10-08 DIAGNOSIS — B399 Histoplasmosis, unspecified: Secondary | ICD-10-CM | POA: Diagnosis not present

## 2015-10-23 ENCOUNTER — Ambulatory Visit (AMBULATORY_SURGERY_CENTER): Payer: Self-pay

## 2015-10-23 VITALS — Ht 67.5 in | Wt 170.6 lb

## 2015-10-23 DIAGNOSIS — Z1211 Encounter for screening for malignant neoplasm of colon: Secondary | ICD-10-CM

## 2015-10-23 MED ORDER — SUPREP BOWEL PREP KIT 17.5-3.13-1.6 GM/177ML PO SOLN: 1.0000 | Freq: Once | ORAL | Status: DC

## 2015-10-23 NOTE — Progress Notes (Signed)
No allergies to eggs or soy No past problems with anesthesia No home oxygen No diet meds  Has email and internet; registered emmi

## 2015-10-30 ENCOUNTER — Other Ambulatory Visit: Payer: Self-pay | Admitting: Cardiovascular Disease

## 2015-10-30 NOTE — Telephone Encounter (Signed)
Rx refill sent to pharmacy. 

## 2015-11-05 ENCOUNTER — Ambulatory Visit (AMBULATORY_SURGERY_CENTER): Payer: 59 | Admitting: Gastroenterology

## 2015-11-05 ENCOUNTER — Encounter: Payer: Self-pay | Admitting: Gastroenterology

## 2015-11-05 VITALS — BP 112/79 | HR 60 | Temp 98.0°F | Resp 12 | Ht 67.0 in | Wt 170.0 lb

## 2015-11-05 DIAGNOSIS — Z1211 Encounter for screening for malignant neoplasm of colon: Secondary | ICD-10-CM | POA: Diagnosis present

## 2015-11-05 MED ORDER — SODIUM CHLORIDE 0.9 % IV SOLN: 500.0000 mL | INTRAVENOUS | Status: DC

## 2015-11-05 NOTE — Progress Notes (Signed)
To pacu vss patent aw report to rn 

## 2015-11-05 NOTE — Op Note (Signed)
[place] Patient Name: Shane [name] Procedure Date: 11/05/2015 1:28 PM MRN: [id number] Endoscopist: Remo Lipps P. Havery Moros , MD Age: 50 Date of Birth: April 03, 1966 Gender: Male Procedure:                Colonoscopy Indications:              Screening for colorectal malignant neoplasm, first                            time exam Medicines:                Monitored Anesthesia Care Procedure:                Pre-Anesthesia Assessment:                           - Prior to the procedure, a History and Physical                            was performed, and patient medications and                            allergies were reviewed. The patient's tolerance of                            previous anesthesia was also reviewed. The risks                            and benefits of the procedure and the sedation                            options and risks were discussed with the patient.                            All questions were answered, and informed consent                            was obtained. Prior Anticoagulants: The patient has                            taken aspirin, last dose was 1 day prior to                            procedure. ASA Grade Assessment: II - A patient                            with mild systemic disease. After reviewing the                            risks and benefits, the patient was deemed in                            satisfactory condition to undergo the procedure.  After obtaining informed consent, the colonoscope                            was passed under direct vision. Throughout the                            procedure, the patient's blood pressure, pulse, and                            oxygen saturations were monitored continuously. The                            Model CF-HQ190L [id number]) scope was introduced                            through the anus and advanced to the the cecum,   identified by appendiceal orifice and ileocecal                            valve. The colonoscopy was performed without                            difficulty. The patient tolerated the procedure                            well. The quality of the bowel preparation was                            good. The ileocecal valve, appendiceal orifice, and                            rectum were photographed. Scope In: 1:46:37 PM Scope Out: 2:00:32 PM Scope Withdrawal Time: 0 hours 11 minutes 35 seconds  Total Procedure Duration: 0 hours 13 minutes 55 seconds  Findings:                 The perianal exam findings include non-thrombosed                            external hemorrhoids.                           Non-bleeding internal hemorrhoids were found during                            retroflexion. The hemorrhoids were large.                           The exam was otherwise without abnormality. No                            inflammatory changes, polyps, or mass lesions noted. Complications:            No immediate complications. Estimated blood loss:  None. Estimated Blood Loss:     Estimated blood loss: none. Impression:               - Non-thrombosed external hemorrhoids found on                            perianal exam.                           - Non-bleeding internal hemorrhoids.                           - The examination was otherwise normal.                           - No specimens collected. Recommendation:           - Patient has a contact number available for                            emergencies. The signs and symptoms of potential                            delayed complications were discussed with the                            patient. Return to normal activities tomorrow.                            Written discharge instructions were provided to the                            patient.                           - Resume previous diet.                            - Continue present medications.                           - Repeat colonoscopy in 10 years for screening                            purposes.                           - Daily fiber supplement for treatment of                            hemorrhoids                           - Consider banding therapy in the clinic as needed                            if hemorrhoid symptoms persist [name] P. Havery Moros, MD 11/05/2015 [id number] PM This report has been signed electronically.

## 2015-11-05 NOTE — Patient Instructions (Signed)
Hemorrhoids seen today. Handouts given on hemorrhoids. Repeat colonoscopy in 10 years.  Use daily fiber supplement for treatment of hemorrhoids.   Continue current medications. Call us with any questions or concerns. Thank you!!   YOU HAD AN ENDOSCOPIC PROCEDURE TODAY AT [facility]:   Refer to the procedure report that was given to you for any specific questions about what was found during the examination.  If the procedure report does not answer your questions, please call your gastroenterologist to clarify.  If you requested that your care partner not be given the details of your procedure findings, then the procedure report has been included in a sealed envelope for you to review at your convenience later.  YOU SHOULD EXPECT: Some feelings of bloating in the abdomen. Passage of more gas than usual.  Walking can help get rid of the air that was put into your GI tract during the procedure and reduce the bloating. If you had a lower endoscopy (such as a colonoscopy or flexible sigmoidoscopy) you may notice spotting of blood in your stool or on the toilet paper. If you underwent a bowel prep for your procedure, you may not have a normal bowel movement for a few days.  Please Note:  You might notice some irritation and congestion in your nose or some drainage.  This is from the oxygen used during your procedure.  There is no need for concern and it should clear up in a day or so.  SYMPTOMS TO REPORT IMMEDIATELY:   Following lower endoscopy (colonoscopy or flexible sigmoidoscopy):  Excessive amounts of blood in the stool  Significant tenderness or worsening of abdominal pains  Swelling of the abdomen that is new, acute  Fever of 100F or higher   For urgent or emergent issues, a gastroenterologist can be reached at any hour by calling [phone].   DIET: Your first meal following the procedure should be a small meal and then it is ok to progress to your normal diet. Heavy  or fried foods are harder to digest and may make you feel nauseous or bloated.  Likewise, meals heavy in dairy and vegetables can increase bloating.  Drink plenty of fluids but you should avoid alcoholic beverages for 24 hours.  ACTIVITY:  You should plan to take it easy for the rest of today and you should NOT DRIVE or use heavy machinery until tomorrow (because of the sedation medicines used during the test).    FOLLOW UP: Our staff will call the number listed on your records the next business day following your procedure to check on you and address any questions or concerns that you may have regarding the information given to you following your procedure. If we do not reach you, we will leave a message.  However, if you are feeling well and you are not experiencing any problems, there is no need to return our call.  We will assume that you have returned to your regular daily activities without incident.  If any biopsies were taken you will be contacted by phone or by letter within the next 1-3 weeks.  Please call us at [phone] if you have not heard about the biopsies in 3 weeks.    SIGNATURES/CONFIDENTIALITY: You and/or your care partner have signed paperwork which will be entered into your electronic medical record.  These signatures attest to the fact that that the information above on your After Visit Summary has been reviewed and is understood.  Full responsibility of  the confidentiality of this discharge information lies with you and/or your care-partner. 

## 2015-11-06 ENCOUNTER — Telehealth: Payer: Self-pay

## 2015-11-06 NOTE — Telephone Encounter (Signed)
  Follow up Call-  Call back number 11/05/2015  Post procedure Call Back phone  # [phone]  Permission to leave phone message Yes     Patient questions:  Do you have a fever, pain , or abdominal swelling? No. Pain Score  0 *  Have you tolerated food without any problems? Yes.    Have you been able to return to your normal activities? Yes.    Do you have any questions about your discharge instructions: Diet   No. Medications  No. Follow up visit  No.  Do you have questions or concerns about your Care? No.  Actions: * If pain score is 4 or above: No action needed, pain <4.

## 2015-11-07 ENCOUNTER — Encounter: Payer: 59 | Admitting: Gastroenterology

## 2015-11-12 ENCOUNTER — Encounter: Payer: Self-pay | Admitting: Hematology & Oncology

## 2015-12-03 ENCOUNTER — Other Ambulatory Visit: Payer: Self-pay | Admitting: Cardiovascular Disease

## 2015-12-17 ENCOUNTER — Encounter (INDEPENDENT_AMBULATORY_CARE_PROVIDER_SITE_OTHER): Payer: 59 | Admitting: Ophthalmology

## 2015-12-17 ENCOUNTER — Encounter: Payer: Self-pay | Admitting: Cardiovascular Disease

## 2015-12-17 ENCOUNTER — Ambulatory Visit (INDEPENDENT_AMBULATORY_CARE_PROVIDER_SITE_OTHER): Payer: 59 | Admitting: Cardiovascular Disease

## 2015-12-17 VITALS — BP 102/74 | HR 62 | Ht 67.0 in | Wt 166.0 lb

## 2015-12-17 DIAGNOSIS — I25700 Atherosclerosis of coronary artery bypass graft(s), unspecified, with unstable angina pectoris: Secondary | ICD-10-CM | POA: Diagnosis not present

## 2015-12-17 DIAGNOSIS — H43813 Vitreous degeneration, bilateral: Secondary | ICD-10-CM | POA: Diagnosis not present

## 2015-12-17 DIAGNOSIS — Z0000 Encounter for general adult medical examination without abnormal findings: Secondary | ICD-10-CM | POA: Diagnosis not present

## 2015-12-17 DIAGNOSIS — H353221 Exudative age-related macular degeneration, left eye, with active choroidal neovascularization: Secondary | ICD-10-CM | POA: Diagnosis not present

## 2015-12-17 DIAGNOSIS — E785 Hyperlipidemia, unspecified: Secondary | ICD-10-CM

## 2015-12-17 DIAGNOSIS — Z79899 Other long term (current) drug therapy: Secondary | ICD-10-CM

## 2015-12-17 DIAGNOSIS — H353111 Nonexudative age-related macular degeneration, right eye, early dry stage: Secondary | ICD-10-CM

## 2015-12-17 NOTE — Assessment & Plan Note (Addendum)
History of CAD status post anterior wall myocardial infarction 2/17/11with drug-eluting stent placed in his proximal LAD. Because of recurrent symptoms, he was recatheterized in August and also underwent coronary artery bypass grafting 02/27/14 with a LIMA to his LAD, vein graft to ramus branch, obtuse marginal branch and RCA. EF was 35% initially which improved up to the 50-55% by 2-D echo 08/27/14 with an anterolateral wall motion abnormality. He is currently asymptomatic.

## 2015-12-17 NOTE — Patient Instructions (Signed)
Medication Instructions:  Your physician recommends that you continue on your current medications as directed. Please refer to the Current Medication list given to you today.   Labwork: Your physician recommends that you return for lab work AT [facility].   Testing/Procedures: none  Follow-Up: Your physician wants you to follow-up in: [place]. You will receive a reminder letter in the mail two months in advance. If you don't receive a letter, please call our office to schedule the follow-up appointment.   Any Other Special Instructions Will Be Listed Below (If Applicable).     If you need a refill on your cardiac medications before your next appointment, please call your pharmacy.

## 2015-12-17 NOTE — Assessment & Plan Note (Signed)
History of hyperlipidemia on statin therapy followed by his PCP. We will check a lipid and liver profile

## 2015-12-17 NOTE — Progress Notes (Signed)
12/17/2015 Shane [name]   [birth year]  [id number]  Primary Physician Enid Skeens., MD Primary Cardiologist: Lorretta Harp MD Renae Gloss   HPI:  [name] is a 50 year old thin appearing divorced Caucasian male father of 2 children who is accompanied by his friend Murlean Hark  He didn't care and are anticipating getting married in November of this year.Marland Kitchen He works as an Sales promotion account executive. His primary care physician is Dr. Cecille Amsterdam. His cardiac risk factor profile is remarkable for hyperlipidemia and family history the father who had bypass surgery at age 3 and a brother who had bypass surgery as well. He suffered an anterior wall myocardial infarction 08/28/09 with a Xience DES stent placed in his proximal LAD. Because of recurrent symptoms he underwent catheterization in August and ultimately coronary bypass grafting 02/27/14 with a LIMA to his LAD, a vein to ramus branch, obtuse marginal branch and the RCA. His ejection fraction was 35%. He recuperated nicely although he did not participate in cardiac rehabilitation. A 2-D echocardiogram performed 08/27/14 revealed an improvement in his ejection fraction up to 50-55% with anterolateral wall motion abnormality. He is completely asymptomatic. Current Outpatient Prescriptions  Medication Sig Dispense Refill  . aspirin 81 MG tablet Take 81 mg by mouth daily.    Marland Kitchen BESIVANCE 0.6 % SUSP as needed.   1  . carvedilol (COREG) 6.25 MG tablet Take 1 tablet (6.25 mg total) by mouth 2 (two) times daily. 60 tablet 6  . ibuprofen (ADVIL,MOTRIN) 200 MG tablet Take 200 mg by mouth every 6 (six) hours as needed.    . metFORMIN (GLUCOPHAGE) 500 MG tablet Take 500 mg by mouth daily with breakfast.    . nitroGLYCERIN (NITROSTAT) 0.4 MG SL tablet Place 1 tablet (0.4 mg total) under the tongue every 5 (five) minutes as needed for chest pain. 15 tablet 6  . NONFORMULARY OR COMPOUNDED ITEM Shots for macular degeneration left eye    .  rosuvastatin (CRESTOR) 20 MG tablet TAKE ONE-HALF TABLET BY MOUTH ONCE DAILY 15 tablet 0  . zolpidem (AMBIEN) 10 MG tablet Take 10 mg by mouth at bedtime as needed.   0   No current facility-administered medications for this visit.    No Known Allergies  Social History   Social History  . Marital Status: Single    Spouse Name: N/A  . Number of Children: N/A  . Years of Education: N/A   Occupational History  . Not on file.   Social History Main Topics  . Smoking status: Never Smoker   . Smokeless tobacco: Former Systems developer     Comment: a long time ago  . Alcohol Use: 7.2 oz/week    12 Cans of beer per week  . Drug Use: No  . Sexual Activity: Not on file   Other Topics Concern  . Not on file   Social History Narrative     Review of Systems: General: negative for chills, fever, night sweats or weight changes.  Cardiovascular: negative for chest pain, dyspnea on exertion, edema, orthopnea, palpitations, paroxysmal nocturnal dyspnea or shortness of breath Dermatological: negative for rash Respiratory: negative for cough or wheezing Urologic: negative for hematuria Abdominal: negative for nausea, vomiting, diarrhea, bright red blood per rectum, melena, or hematemesis Neurologic: negative for visual changes, syncope, or dizziness All other systems reviewed and are otherwise negative except as noted above.    Blood pressure 102/74, pulse 62, height 5\' 7"  (1.702 m), weight 166 lb (75.297 kg).  General  appearance: alert and no distress Neck: no adenopathy, no carotid bruit, no JVD, supple, symmetrical, trachea midline and thyroid not enlarged, symmetric, no tenderness/mass/nodules Lungs: clear to auscultation bilaterally Heart: regular rate and rhythm, S1, S2 normal, no murmur, click, rub or gallop Extremities: extremities normal, atraumatic, no cyanosis or edema  EKG normal sinus rhythm at 62with septal Q waves and left anterior fascicular block. I personally reviewed this  EKG  ASSESSMENT AND PLAN:   CAD (coronary artery disease) of artery bypass graft History of CAD status post anterior wall myocardial infarction 2/17/11with drug-eluting stent placed in his proximal LAD. Because of recurrent symptoms, he was recatheterized in August and also underwent coronary artery bypass grafting 02/27/14 with a LIMA to his LAD, vein graft to ramus branch, obtuse marginal branch and RCA. EF was 35% initially which improved up to the 50-55% by 2-D echo 08/27/14 with an anterolateral wall motion abnormality. He is currently asymptomatic.  Hyperlipidemia History of hyperlipidemia on statin therapy followed by his PCP. We will check a lipid and liver profile      Lorretta Harp MD [facility], [facility] 12/17/2015 11:33 AM

## 2015-12-24 ENCOUNTER — Ambulatory Visit: Payer: 59

## 2015-12-24 ENCOUNTER — Other Ambulatory Visit (HOSPITAL_BASED_OUTPATIENT_CLINIC_OR_DEPARTMENT_OTHER): Payer: 59

## 2015-12-24 ENCOUNTER — Other Ambulatory Visit: Payer: Self-pay | Admitting: Lab

## 2015-12-24 ENCOUNTER — Ambulatory Visit (HOSPITAL_BASED_OUTPATIENT_CLINIC_OR_DEPARTMENT_OTHER): Payer: 59 | Admitting: Hematology & Oncology

## 2015-12-24 ENCOUNTER — Encounter: Payer: Self-pay | Admitting: Hematology & Oncology

## 2015-12-24 VITALS — BP 126/86 | HR 59 | Temp 97.8°F | Resp 16 | Ht 67.0 in | Wt 162.0 lb

## 2015-12-24 DIAGNOSIS — C8102 Nodular lymphocyte predominant Hodgkin lymphoma, intrathoracic lymph nodes: Secondary | ICD-10-CM

## 2015-12-24 DIAGNOSIS — Z8572 Personal history of non-Hodgkin lymphomas: Secondary | ICD-10-CM | POA: Diagnosis not present

## 2015-12-24 DIAGNOSIS — Z9221 Personal history of antineoplastic chemotherapy: Secondary | ICD-10-CM | POA: Diagnosis not present

## 2015-12-24 DIAGNOSIS — Z125 Encounter for screening for malignant neoplasm of prostate: Secondary | ICD-10-CM

## 2015-12-24 DIAGNOSIS — I2510 Atherosclerotic heart disease of native coronary artery without angina pectoris: Secondary | ICD-10-CM | POA: Diagnosis not present

## 2015-12-24 DIAGNOSIS — E785 Hyperlipidemia, unspecified: Secondary | ICD-10-CM

## 2015-12-24 LAB — CBC WITH DIFFERENTIAL (CANCER CENTER ONLY)
BASO#: 0 10*3/uL (ref 0.0–0.2)
BASO%: 0.6 % (ref 0.0–2.0)
EOS%: 2 % (ref 0.0–7.0)
Eosinophils Absolute: 0.1 10*3/uL (ref 0.0–0.5)
HEMATOCRIT: 44.7 % (ref 38.7–49.9)
HEMOGLOBIN: 15.7 g/dL (ref 13.0–17.1)
LYMPH#: 1.3 10*3/uL (ref 0.9–3.3)
LYMPH%: 26.3 % (ref 14.0–48.0)
MCH: 32.6 pg (ref 28.0–33.4)
MCHC: 35.1 g/dL (ref 32.0–35.9)
MCV: 93 fL (ref 82–98)
MONO#: 0.5 10*3/uL (ref 0.1–0.9)
MONO%: 9.7 % (ref 0.0–13.0)
NEUT%: 61.4 % (ref 40.0–80.0)
NEUTROS ABS: 3.1 10*3/uL (ref 1.5–6.5)
Platelets: 263 10*3/uL (ref 145–400)
RBC: 4.82 10*6/uL (ref 4.20–5.70)
RDW: 12.9 % (ref 11.1–15.7)
WBC: 5.1 10*3/uL (ref 4.0–10.0)

## 2015-12-24 LAB — COMPREHENSIVE METABOLIC PANEL
ALBUMIN: 4.5 g/dL (ref 3.5–5.0)
ALK PHOS: 45 U/L (ref 40–150)
ALT: 34 U/L (ref 0–55)
ANION GAP: 8 meq/L (ref 3–11)
AST: 25 U/L (ref 5–34)
BILIRUBIN TOTAL: 0.99 mg/dL (ref 0.20–1.20)
BUN: 14.6 mg/dL (ref 7.0–26.0)
CALCIUM: 9.8 mg/dL (ref 8.4–10.4)
CO2: 28 mEq/L (ref 22–29)
Chloride: 105 mEq/L (ref 98–109)
Creatinine: 1.1 mg/dL (ref 0.7–1.3)
EGFR: 80 mL/min/{1.73_m2} — AB (ref >90)
GLUCOSE: 95 mg/dL (ref 70–140)
POTASSIUM: 4.6 meq/L (ref 3.5–5.1)
SODIUM: 141 meq/L (ref 136–145)
TOTAL PROTEIN: 7.6 g/dL (ref 6.4–8.3)

## 2015-12-24 LAB — LACTATE DEHYDROGENASE: LDH: 193 U/L (ref 125–245)

## 2015-12-24 LAB — CHCC SATELLITE - SMEAR

## 2015-12-24 NOTE — Progress Notes (Signed)
Referral MD  Reason for Referral: H/O Stage IIB Hodgkin's Disease  Chief Complaint  Patient presents with  . Other    New Patient  : I just need a follow-up.  HPI: Shane [name] is a nice 50 year old white male. I saw him probably close to 20 years ago. He presented with bulky stage IIb Hodgkin's disease. He had a mediastinal mass. He was treated with ABVD followed by radiation. He has been in remission and cured.  Physical problem is cardiac disease.  He has already had coronary artery stent placed. He has had CABG. He had 3 vessels bypassed. He has a strong family history of coronary artery disease. Both his brothers had bypass surgery the same year he did in 2015.  He also has macular degeneration of the left eye. He gets Avastin shots.  He is now engaged. He will be married in November down the [place].  He is working full-time. He really has had no issues from a standpoint of Hodgkin's disease.  He does have some urinary hesitancy. He's not had a PSA checked.  I think he has had a colonoscopy already.  He has had no fever. He has had no rashes. He has had no nausea or vomiting. He has had no cough or shortness of breath.  He still works out quite a bit.  Overall, his performance status is ECOG 0.   Past Medical History  Diagnosis Date  . Hodgkin disease ([place]) 1997  . CAD (coronary artery disease)     stent 08/2009 and CABG 2015 (both at [facility])  . Hyperlipidemia   . PVC's (premature ventricular contractions)   . Macular degeneration     left eye  . Diabetes mellitus ([place])     diet controlled  :  Past Surgical History  Procedure Laterality Date  . Coronary artery bypass graft  02/2014  . Cardiac catheterization  08/2009    PCI -LAD 2.88mmx18mm Xience  :   Current outpatient prescriptions:  .  aspirin 81 MG tablet, Take 81 mg by mouth daily., Disp: , Rfl:  .  BESIVANCE 0.6 % SUSP, as needed. , Disp: , Rfl: 1 .  carvedilol (COREG) 6.25 MG tablet,  Take 1 tablet (6.25 mg total) by mouth 2 (two) times daily., Disp: 60 tablet, Rfl: 6 .  ibuprofen (ADVIL,MOTRIN) 200 MG tablet, Take 200 mg by mouth every 6 (six) hours as needed., Disp: , Rfl:  .  metFORMIN (GLUCOPHAGE) 500 MG tablet, Take 500 mg by mouth daily with breakfast., Disp: , Rfl:  .  nitroGLYCERIN (NITROSTAT) 0.4 MG SL tablet, Place 1 tablet (0.4 mg total) under the tongue every 5 (five) minutes as needed for chest pain., Disp: 15 tablet, Rfl: 6 .  NONFORMULARY OR COMPOUNDED ITEM, Shots for macular degeneration left eye, Disp: , Rfl:  .  rosuvastatin (CRESTOR) 20 MG tablet, TAKE ONE-HALF TABLET BY MOUTH ONCE DAILY, Disp: 15 tablet, Rfl: 0 .  zolpidem (AMBIEN) 10 MG tablet, Take 10 mg by mouth at bedtime as needed. , Disp: , Rfl: 0:  :  No Known Allergies:  Family History  Problem Relation Age of Onset  . Stroke Mother   . Heart disease Father   . Heart Problems Brother     heart valve replaced at 70years old  . Heart Problems Brother     MI at age 64; CABG at age 12  . Colon cancer Neg Hx   :  Social History   Social History  .  Marital Status: Single    Spouse Name: N/A  . Number of Children: N/A  . Years of Education: N/A   Occupational History  . Not on file.   Social History Main Topics  . Smoking status: Never Smoker   . Smokeless tobacco: Former Systems developer     Comment: a long time ago  . Alcohol Use: 7.2 oz/week    12 Cans of beer per week  . Drug Use: No  . Sexual Activity: Not on file   Other Topics Concern  . Not on file   Social History Narrative  :  Pertinent items are noted in HPI.  Exam: @[employer] @ Well-developed well-nourished white male in no obvious distress. Vital signs show temperature of 97.8. Pulse 59. Blood pressure 126/86. Weight is 162 pounds.  Head and neck exam shows no ocular or oral lesions. He has no palpable cervical or supraclavicular lymph nodes. Lungs are clear bilaterally. Cardiac exam regular rate and rhythm with no murmurs,  rubs or bruits. Abdomen is soft. He has good bowel sounds. There is no fluid wave. There is no palpable liver or spleen tip. Back exam shows no tenderness over the spine, ribs or hips. Extremities shows no clubbing, cyanosis or edema. Neurological exam shows no focal neurological deficits.  Recent Labs  12/24/15 1050  WBC 5.1  HGB 15.7  HCT 44.7  PLT 263    Recent Labs  12/24/15 1051  NA 141  K 4.6  CO2 28  GLUCOSE 95  BUN 14.6  CREATININE 1.1  CALCIUM 9.8    Blood smear review:  none  Pathology: none    Assessment and Plan:  Shane [name] is a nice 50 year old white male with a past history of Hodgkin's disease. He was treated with chemotherapy and radiation therapy.  I think that the radiation therapy certainly had an impact on his coronary artery disease. Obviously, there is significant familial risk factors given his strong family history of coronary artery disease. However, I think radiation therapy probably "sped this" along.  I am not sure if there is any other risk factors for him. His blood counts look okay. I did look as blood on the microscope. I did not see anything that looked suspicious. Given that we used ABVD chemotherapy, I don't think he will run into problems with myelodysplasia.  I suppose he could be at risk for another kind of cancer. He did have a PSA done earlier this year. This looked okay.  He is at risk for thyroid dysfunction. Again, his TSH was checked and everything looked okay.  I just don't think that we really have to do anything else for him. He does not need any scans. He does not eat a PET scan . I spent about an hour with he and his fiance. I'm glad that he is getting married. I'm sure that this will make his life a lot better.  We will be were having to see him back if any other issues come up.

## 2015-12-25 ENCOUNTER — Telehealth: Payer: Self-pay | Admitting: Nurse Practitioner

## 2015-12-25 LAB — PSA: Prostate Specific Ag, Serum: 1.1 ng/mL (ref 0.0–4.0)

## 2015-12-25 NOTE — Telephone Encounter (Addendum)
Pt verbalized understanding ----- Message from Volanda Napoleon, MD sent at 12/25/2015  8:10 AM EDT ----- Call - PSA is normal!!!  pete

## 2016-01-11 ENCOUNTER — Other Ambulatory Visit: Payer: Self-pay | Admitting: Cardiovascular Disease

## 2016-02-25 LAB — LIPID PANEL
CHOL/HDL RATIO: 2.5 ratio (ref <=5.0)
Cholesterol: 158 mg/dL (ref 125–200)
HDL: 64 mg/dL (ref >=40)
LDL CALC: 60 mg/dL (ref <130)
Triglycerides: 168 mg/dL — ABNORMAL HIGH (ref <150)
VLDL: 34 mg/dL — AB (ref <30)

## 2016-02-25 LAB — HEPATIC FUNCTION PANEL
ALK PHOS: 46 U/L (ref 40–115)
ALT: 33 U/L (ref 9–46)
AST: 24 U/L (ref 10–35)
Albumin: 4.6 g/dL (ref 3.6–5.1)
BILIRUBIN DIRECT: 0.2 mg/dL (ref <=0.2)
BILIRUBIN INDIRECT: 0.6 mg/dL (ref 0.2–1.2)
BILIRUBIN TOTAL: 0.8 mg/dL (ref 0.2–1.2)
Total Protein: 7.1 g/dL (ref 6.1–8.1)

## 2016-03-03 ENCOUNTER — Encounter (INDEPENDENT_AMBULATORY_CARE_PROVIDER_SITE_OTHER): Payer: 59 | Admitting: Ophthalmology

## 2016-03-03 DIAGNOSIS — H43813 Vitreous degeneration, bilateral: Secondary | ICD-10-CM

## 2016-03-03 DIAGNOSIS — H353111 Nonexudative age-related macular degeneration, right eye, early dry stage: Secondary | ICD-10-CM | POA: Diagnosis not present

## 2016-03-03 DIAGNOSIS — H35033 Hypertensive retinopathy, bilateral: Secondary | ICD-10-CM | POA: Diagnosis not present

## 2016-03-03 DIAGNOSIS — I10 Essential (primary) hypertension: Secondary | ICD-10-CM | POA: Diagnosis not present

## 2016-03-03 DIAGNOSIS — H353221 Exudative age-related macular degeneration, left eye, with active choroidal neovascularization: Secondary | ICD-10-CM | POA: Diagnosis not present

## 2016-04-29 ENCOUNTER — Encounter (INDEPENDENT_AMBULATORY_CARE_PROVIDER_SITE_OTHER): Payer: 59 | Admitting: Ophthalmology

## 2016-04-29 DIAGNOSIS — H35033 Hypertensive retinopathy, bilateral: Secondary | ICD-10-CM

## 2016-04-29 DIAGNOSIS — B399 Histoplasmosis, unspecified: Secondary | ICD-10-CM | POA: Diagnosis not present

## 2016-04-29 DIAGNOSIS — I10 Essential (primary) hypertension: Secondary | ICD-10-CM | POA: Diagnosis not present

## 2016-04-29 DIAGNOSIS — H43813 Vitreous degeneration, bilateral: Secondary | ICD-10-CM | POA: Diagnosis not present

## 2016-04-29 DIAGNOSIS — H2512 Age-related nuclear cataract, left eye: Secondary | ICD-10-CM

## 2016-04-29 DIAGNOSIS — H353111 Nonexudative age-related macular degeneration, right eye, early dry stage: Secondary | ICD-10-CM

## 2016-04-29 DIAGNOSIS — H32 Chorioretinal disorders in diseases classified elsewhere: Secondary | ICD-10-CM

## 2016-04-29 DIAGNOSIS — H353221 Exudative age-related macular degeneration, left eye, with active choroidal neovascularization: Secondary | ICD-10-CM

## 2016-05-23 ENCOUNTER — Other Ambulatory Visit: Payer: Self-pay | Admitting: Cardiovascular Disease

## 2016-06-09 ENCOUNTER — Encounter: Payer: Self-pay | Admitting: Cardiovascular Disease

## 2016-06-09 ENCOUNTER — Ambulatory Visit (INDEPENDENT_AMBULATORY_CARE_PROVIDER_SITE_OTHER): Payer: 59 | Admitting: Cardiovascular Disease

## 2016-06-09 VITALS — BP 114/68 | HR 82 | Ht 67.0 in | Wt 166.0 lb

## 2016-06-09 DIAGNOSIS — I493 Ventricular premature depolarization: Secondary | ICD-10-CM | POA: Diagnosis not present

## 2016-06-09 NOTE — Assessment & Plan Note (Signed)
History of PVCs with 12-lead EKG today revealing ventricular trigeminy.

## 2016-06-09 NOTE — Patient Instructions (Signed)
Medication Instructions: Your physician recommends that you continue on your current medications as directed. Please refer to the Current Medication list given to you today.   Testing/Procedures: Your physician has requested that you have an echocardiogram. Echocardiography is a painless test that uses sound waves to create images of your heart. It provides your doctor with information about the size and shape of your heart and how well your heart's chambers and valves are working. This procedure takes approximately one hour. There are no restrictions for this procedure.  Your physician has requested that you have en exercise stress myoview. For further information please visit [email or web address]. Please follow instruction sheet, as given.   Follow-Up: Your physician wants you to follow-up in: 6 months with Dr. Gwenlyn Found unless abnormal test results. You will receive a reminder letter in the mail two months in advance. If you don't receive a letter, please call our office to schedule the follow-up appointment.  If you need a refill on your cardiac medications before your next appointment, please call your pharmacy.

## 2016-06-09 NOTE — Progress Notes (Signed)
06/09/2016 Shane [name]   Apr 13, 1966  [id number]  Primary Physician Enid Skeens., MD Primary Cardiologist: Lorretta Harp MD Renae Gloss  HPI:  Shane [name] is a 50 year old thin appearing divorced Caucasian male father of 2 children who is accompanied by [name]   I last saw him in the office 12/17/15  He works as an Sales promotion account executive. His primary care physician is Dr. Cecille Amsterdam. He recently was married earlier this month and he and [name] honeymoon in [place]. His cardiac risk factor profile is remarkable for hyperlipidemia and family history the father who had bypass surgery at age 10 and a brother who had bypass surgery as well. He suffered an anterior wall myocardial infarction 08/28/09 with a Xience DES stent placed in his proximal LAD. Because of recurrent symptoms he underwent catheterization in August and ultimately coronary bypass grafting 02/27/14 with a LIMA to his LAD, a vein to ramus branch, obtuse marginal branch and the RCA. His ejection fraction was 35%. He recuperated nicely although he did not participate in cardiac rehabilitation. A 2-D echocardiogram performed 08/27/14 revealed an improvement in his ejection fraction up to 50-55% with anterolateral wall motion abnormality. While honeymooning in [place] he noticed prolonged recovery from exercise activity as well as "just not feeling right". He denies chest pain.   Current Outpatient Prescriptions  Medication Sig Dispense Refill  . aspirin 81 MG tablet Take 81 mg by mouth daily.    Marland Kitchen BESIVANCE 0.6 % SUSP as needed.   1  . carvedilol (COREG) 6.25 MG tablet Take 1 tablet (6.25 mg total) by mouth 2 (two) times daily. 60 tablet 6  . ibuprofen (ADVIL,MOTRIN) 200 MG tablet Take 200 mg by mouth every 6 (six) hours as needed.    . metFORMIN (GLUCOPHAGE) 500 MG tablet Take 500 mg by mouth daily with breakfast.    . NITROSTAT 0.4 MG SL tablet DISSOLVE ONE TABLET UNDER THE TONGUE EVERY 5  MINUTES AS NEEDED FOR CHEST PAIN.  DO NOT EXCEED A TOTAL OF 3 DOSES IN 15 MINUTES 25 tablet 6  . NONFORMULARY OR COMPOUNDED ITEM Shots for macular degeneration left eye    . rosuvastatin (CRESTOR) 20 MG tablet TAKE ONE-HALF TABLET BY MOUTH ONCE DAILY 15 tablet 11  . zolpidem (AMBIEN) 10 MG tablet Take 10 mg by mouth at bedtime as needed.   0   No current facility-administered medications for this visit.     No Known Allergies  Social History   Social History  . Marital status: Single    Spouse name: N/A  . Number of children: N/A  . Years of education: N/A   Occupational History  . Not on file.   Social History Main Topics  . Smoking status: Never Smoker  . Smokeless tobacco: Former Systems developer     Comment: a long time ago  . Alcohol use 7.2 oz/week    12 Cans of beer per week  . Drug use: No  . Sexual activity: Not on file   Other Topics Concern  . Not on file   Social History Narrative  . No narrative on file     Review of Systems: General: negative for chills, fever, night sweats or weight changes.  Cardiovascular: negative for chest pain, dyspnea on exertion, edema, orthopnea, palpitations, paroxysmal nocturnal dyspnea or shortness of breath Dermatological: negative for rash Respiratory: negative for cough or wheezing Urologic: negative for hematuria Abdominal: negative for nausea, vomiting, diarrhea, bright  red blood per rectum, melena, or hematemesis Neurologic: negative for visual changes, syncope, or dizziness All other systems reviewed and are otherwise negative except as noted above.    Blood pressure 114/68, pulse 82, height 5\' 7"  (1.702 m), weight 166 lb (75.3 kg).  General appearance: alert and no distress Neck: no adenopathy, no carotid bruit, no JVD, supple, symmetrical, trachea midline and thyroid not enlarged, symmetric, no tenderness/mass/nodules Lungs: clear to auscultation bilaterally Heart: regular rate and rhythm, S1, S2 normal, no murmur, click,  rub or gallop Extremities: extremities normal, atraumatic, no cyanosis or edema  EKG sinus rhythm at 82 with septal Q waves and ventricular trigeminy. I personally reviewed this EKG  ASSESSMENT AND PLAN:   CAD (coronary artery disease) of artery bypass graft History of CAD status post anterior wall microinfarction 08/28/09 she was in XI[facility] DES stenting of his proximal LAD. Because of recurrent symptoms he underwent catheterization in August 2015 ultimately requiring coronary artery bypass grafting 02/27/14 with a LIMA to his LAD, vein to ramus branch, obtuse marginal branch and to the RCA. This EF initially was 35% which improved to 50-55% by subsequent to the echo 08/27/14. He recently noticed symptoms of prolonged recovery after exercise" just not feeling right". He denies chest pain. I am going to get a 2-D echocardiogram and exercise Myoview stress test to further evaluate and rule out an ischemic etiology.  PVC's (premature ventricular contractions) History of PVCs with 12-lead EKG today revealing ventricular trigeminy.      Lorretta Harp MD FACP,FACC,FAHA, [facility] 06/09/2016 9:05 AM

## 2016-06-09 NOTE — Assessment & Plan Note (Signed)
History of CAD status post anterior wall microinfarction 08/28/09 she was in XIENCE DES stenting of his proximal LAD. Because of recurrent symptoms he underwent catheterization in August 2015 ultimately requiring coronary artery bypass grafting 02/27/14 with a LIMA to his LAD, vein to ramus branch, obtuse marginal branch and to the RCA. This EF initially was 35% which improved to 50-55% by subsequent to the echo 08/27/14. He recently noticed symptoms of prolonged recovery after exercise" just not feeling right". He denies chest pain. I am going to get a 2-D echocardiogram and exercise Myoview stress test to further evaluate and rule out an ischemic etiology.

## 2016-06-16 ENCOUNTER — Telehealth (HOSPITAL_COMMUNITY): Payer: Self-pay

## 2016-06-16 NOTE — Telephone Encounter (Signed)
Encounter complete. 

## 2016-06-18 ENCOUNTER — Telehealth (HOSPITAL_COMMUNITY): Payer: Self-pay

## 2016-06-18 ENCOUNTER — Ambulatory Visit (HOSPITAL_COMMUNITY)
Admission: RE | Admit: 2016-06-18 | Discharge: 2016-06-18 | Disposition: A | Discharge status: Home / Self Care | Payer: 59 | Source: Ambulatory Visit | Attending: Cardiology | Admitting: Cardiology

## 2016-06-18 DIAGNOSIS — I493 Ventricular premature depolarization: Secondary | ICD-10-CM | POA: Insufficient documentation

## 2016-06-18 DIAGNOSIS — R9439 Abnormal result of other cardiovascular function study: Secondary | ICD-10-CM | POA: Diagnosis not present

## 2016-06-18 LAB — MYOCARDIAL PERFUSION IMAGING
CHL CUP NUCLEAR SDS: 0
CHL CUP NUCLEAR SRS: 10
CHL CUP NUCLEAR SSS: 10
CSEPEW: 13.4 METS
CSEPPHR: 155 {beats}/min
Exercise duration (min): 11 min
Exercise duration (sec): 31 s
LVDIAVOL: 125 mL (ref 62–150)
LVSYSVOL: 64 mL
MPHR: 169 {beats}/min
Percent HR: 91 %
RPE: 16
Rest HR: 62 {beats}/min
TID: 0.94

## 2016-06-18 MED ORDER — TECHNETIUM TC 99M TETROFOSMIN IV KIT
10.9000 | PACK | Freq: Once | INTRAVENOUS | Status: AC | PRN
  Administered 2016-06-18: 10.9 via INTRAVENOUS
  Filled 2016-06-18: qty 11

## 2016-06-18 MED ORDER — TECHNETIUM TC 99M TETROFOSMIN IV KIT
30.1000 | PACK | Freq: Once | INTRAVENOUS | Status: AC | PRN
  Administered 2016-06-18: 30.1 via INTRAVENOUS
  Filled 2016-06-18: qty 31

## 2016-06-18 NOTE — Telephone Encounter (Signed)
Encounter complete. 

## 2016-07-01 ENCOUNTER — Encounter (INDEPENDENT_AMBULATORY_CARE_PROVIDER_SITE_OTHER): Payer: 59 | Admitting: Ophthalmology

## 2016-07-01 DIAGNOSIS — B399 Histoplasmosis, unspecified: Secondary | ICD-10-CM

## 2016-07-01 DIAGNOSIS — H35033 Hypertensive retinopathy, bilateral: Secondary | ICD-10-CM

## 2016-07-01 DIAGNOSIS — H43813 Vitreous degeneration, bilateral: Secondary | ICD-10-CM

## 2016-07-01 DIAGNOSIS — I10 Essential (primary) hypertension: Secondary | ICD-10-CM

## 2016-07-01 DIAGNOSIS — H353111 Nonexudative age-related macular degeneration, right eye, early dry stage: Secondary | ICD-10-CM | POA: Diagnosis not present

## 2016-07-01 DIAGNOSIS — H32 Chorioretinal disorders in diseases classified elsewhere: Secondary | ICD-10-CM | POA: Diagnosis not present

## 2016-07-01 DIAGNOSIS — H353221 Exudative age-related macular degeneration, left eye, with active choroidal neovascularization: Secondary | ICD-10-CM

## 2016-07-02 ENCOUNTER — Other Ambulatory Visit: Payer: Self-pay

## 2016-07-02 ENCOUNTER — Ambulatory Visit (HOSPITAL_COMMUNITY): Payer: 59 | Attending: Cardiology

## 2016-07-02 DIAGNOSIS — I081 Rheumatic disorders of both mitral and tricuspid valves: Secondary | ICD-10-CM | POA: Diagnosis not present

## 2016-07-02 DIAGNOSIS — I493 Ventricular premature depolarization: Secondary | ICD-10-CM | POA: Insufficient documentation

## 2016-08-23 DIAGNOSIS — H0015 Chalazion left lower eyelid: Secondary | ICD-10-CM | POA: Diagnosis not present

## 2016-08-26 ENCOUNTER — Encounter (INDEPENDENT_AMBULATORY_CARE_PROVIDER_SITE_OTHER): Payer: 59 | Admitting: Ophthalmology

## 2016-09-09 ENCOUNTER — Encounter (INDEPENDENT_AMBULATORY_CARE_PROVIDER_SITE_OTHER): Payer: BLUE CROSS/BLUE SHIELD | Admitting: Ophthalmology

## 2016-09-09 DIAGNOSIS — H35033 Hypertensive retinopathy, bilateral: Secondary | ICD-10-CM | POA: Diagnosis not present

## 2016-09-09 DIAGNOSIS — H43813 Vitreous degeneration, bilateral: Secondary | ICD-10-CM | POA: Diagnosis not present

## 2016-09-09 DIAGNOSIS — I10 Essential (primary) hypertension: Secondary | ICD-10-CM | POA: Diagnosis not present

## 2016-09-09 DIAGNOSIS — H353221 Exudative age-related macular degeneration, left eye, with active choroidal neovascularization: Secondary | ICD-10-CM

## 2016-09-09 DIAGNOSIS — H353111 Nonexudative age-related macular degeneration, right eye, early dry stage: Secondary | ICD-10-CM

## 2016-11-18 ENCOUNTER — Encounter (INDEPENDENT_AMBULATORY_CARE_PROVIDER_SITE_OTHER): Payer: BLUE CROSS/BLUE SHIELD | Admitting: Ophthalmology

## 2016-11-18 DIAGNOSIS — H353111 Nonexudative age-related macular degeneration, right eye, early dry stage: Secondary | ICD-10-CM

## 2016-11-18 DIAGNOSIS — H35033 Hypertensive retinopathy, bilateral: Secondary | ICD-10-CM | POA: Diagnosis not present

## 2016-11-18 DIAGNOSIS — I10 Essential (primary) hypertension: Secondary | ICD-10-CM

## 2016-11-18 DIAGNOSIS — H43813 Vitreous degeneration, bilateral: Secondary | ICD-10-CM | POA: Diagnosis not present

## 2016-11-18 DIAGNOSIS — H353221 Exudative age-related macular degeneration, left eye, with active choroidal neovascularization: Secondary | ICD-10-CM

## 2016-11-24 ENCOUNTER — Telehealth: Payer: Self-pay | Admitting: Cardiovascular Disease

## 2016-11-24 NOTE — Telephone Encounter (Signed)
New message    Angelica from [facility] is calling to follow up on refill request they sent over.    *STAT* If patient is at the pharmacy, call can be transferred to refill team.   1. Which medications need to be refilled? (please list name of each medication and dose if known) carvedilol 6.25 mg  2. Which pharmacy/location (including street and city if local pharmacy) is medication to be sent to? [facility]  3. Do they need a 30 day or 90 day supply? 30 day

## 2016-11-24 NOTE — Telephone Encounter (Signed)
Spoke with [place], needed Rx for Carvedilol 6.25 mg #180 with 2 refills called

## 2017-01-18 ENCOUNTER — Ambulatory Visit (INDEPENDENT_AMBULATORY_CARE_PROVIDER_SITE_OTHER): Payer: BLUE CROSS/BLUE SHIELD | Admitting: Cardiovascular Disease

## 2017-01-18 ENCOUNTER — Encounter: Payer: Self-pay | Admitting: Cardiovascular Disease

## 2017-01-18 VITALS — BP 134/70 | HR 68 | Ht 67.5 in | Wt 163.0 lb

## 2017-01-18 DIAGNOSIS — I25700 Atherosclerosis of coronary artery bypass graft(s), unspecified, with unstable angina pectoris: Secondary | ICD-10-CM | POA: Diagnosis not present

## 2017-01-18 DIAGNOSIS — E785 Hyperlipidemia, unspecified: Secondary | ICD-10-CM

## 2017-01-18 MED ORDER — NITROGLYCERIN 0.4 MG SL SUBL: 0.4000 mg | SUBLINGUAL_TABLET | SUBLINGUAL | 6 refills | Status: DC | PRN

## 2017-01-18 NOTE — Addendum Note (Signed)
Addended by: Therisa Doyne on: 01/18/2017 03:38 PM   Modules accepted: Orders

## 2017-01-18 NOTE — Assessment & Plan Note (Addendum)
History of CAD status post anterior wall myocardial infarction 08/28/09 with a Xience drug-eluting stent is proximal LAD. Because of recurrent symptoms and with cardiac catheterization in August of line requiring coronary artery bypass grafting 02/27/14 with a LIMA to his LAD, vein to ramus branch, obtuse marginal branch and RCA. His EF at that time was 35%. Recent echo revealed his EF to be in the 50th 55% without segmental wall motion and modalities and a Myoview stress test performed 06/18/16 was nonischemic. He denies chest pain or shortness of breath.

## 2017-01-18 NOTE — Patient Instructions (Signed)

## 2017-01-18 NOTE — Progress Notes (Signed)
01/18/2017 Shane [name]   08/06/1965  [id number]  Primary Physician Wendie Agreste Marshall Cork., MD Primary Cardiologist: Lorretta Harp MD Renae Gloss  HPI:  Shane [name] is a 51 year old thin appearing divorced Caucasian male father of 2 children who is accompanied by [name]  I last saw him in the office 06/09/16  He works as an Sales promotion account executive. His primary care physician is Dr. Cecille Amsterdam. He recently was married earlier this month and he and [name] honeymoon in [place]. His cardiac risk factor profile is remarkable for hyperlipidemia and family history the father who had bypass surgery at age 66 and a brother who had bypass surgery as well. He suffered an anterior wall myocardial infarction 08/28/09 with a Xience DES stent placed in his proximal LAD. Because of recurrent symptoms he underwent catheterization in August and ultimately coronary bypass grafting 02/27/14 with a LIMA to his LAD, a vein to ramus branch, obtuse marginal branch and the RCA. His ejection fraction was 35%. He recuperated nicely although he did not participate in cardiac rehabilitation. A 2-D echocardiogram performed 08/27/14 revealed an improvement in his ejection fraction up to 50-55% with anterolateral wall motion abnormality. While honeymooning in [place] he noticed prolonged recovery from exercise activity as well as "just not feeling right". He denies chest pain. I performed a Myoview stress test on him after that on 06/28/16 which was low risk and nonischemic and 2-D echo that revealed preserved LV function with EF of 50-55%. Those symptoms have since subsided.   Current Outpatient Prescriptions  Medication Sig Dispense Refill  . aspirin 81 MG tablet Take 81 mg by mouth daily.    Marland Kitchen BESIVANCE 0.6 % SUSP as needed.   1  . carvedilol (COREG) 6.25 MG tablet Take 1 tablet (6.25 mg total) by mouth 2 (two) times daily. 60 tablet 6  . ibuprofen (ADVIL,MOTRIN) 200 MG tablet Take 200 mg  by mouth every 6 (six) hours as needed.    . metFORMIN (GLUCOPHAGE) 500 MG tablet Take 500 mg by mouth daily with breakfast.    . NITROSTAT 0.4 MG SL tablet DISSOLVE ONE TABLET UNDER THE TONGUE EVERY 5 MINUTES AS NEEDED FOR CHEST PAIN.  DO NOT EXCEED A TOTAL OF 3 DOSES IN 15 MINUTES 25 tablet 6  . NONFORMULARY OR COMPOUNDED ITEM Shots for macular degeneration left eye    . rosuvastatin (CRESTOR) 20 MG tablet TAKE ONE-HALF TABLET BY MOUTH ONCE DAILY 15 tablet 11  . zolpidem (AMBIEN) 10 MG tablet Take 10 mg by mouth at bedtime as needed.   0   No current facility-administered medications for this visit.     No Known Allergies  Social History   Social History  . Marital status: Single    Spouse name: N/A  . Number of children: N/A  . Years of education: N/A   Occupational History  . Not on file.   Social History Main Topics  . Smoking status: Never Smoker  . Smokeless tobacco: Former Systems developer     Comment: a long time ago  . Alcohol use 7.2 oz/week    12 Cans of beer per week  . Drug use: No  . Sexual activity: Not on file   Other Topics Concern  . Not on file   Social History Narrative  . No narrative on file     Review of Systems: General: negative for chills, fever, night sweats or weight changes.  Cardiovascular: negative for chest  pain, dyspnea on exertion, edema, orthopnea, palpitations, paroxysmal nocturnal dyspnea or shortness of breath Dermatological: negative for rash Respiratory: negative for cough or wheezing Urologic: negative for hematuria Abdominal: negative for nausea, vomiting, diarrhea, bright red blood per rectum, melena, or hematemesis Neurologic: negative for visual changes, syncope, or dizziness All other systems reviewed and are otherwise negative except as noted above.    Blood pressure 134/70, pulse 68, height 5' 7.5" (1.715 m), weight 163 lb (73.9 kg).  General appearance: alert and no distress Neck: no adenopathy, no carotid bruit, no JVD,  supple, symmetrical, trachea midline and thyroid not enlarged, symmetric, no tenderness/mass/nodules Lungs: clear to auscultation bilaterally Heart: regular rate and rhythm, S1, S2 normal, no murmur, click, rub or gallop Extremities: extremities normal, atraumatic, no cyanosis or edema  EKG sinus rhythm at 68 with septal Q waves, left anterior fascicular block and occasional PVCs. I personally reviewed this EKG.  ASSESSMENT AND PLAN:   CAD (coronary artery disease) of artery bypass graft History of CAD status post anterior wall myocardial infarction 08/28/09 with a Xience drug-eluting stent is proximal LAD. Because of recurrent symptoms and with cardiac catheterization in August of line requiring coronary artery bypass grafting 02/27/14 with a LIMA to his LAD, vein to ramus branch, obtuse marginal branch and RCA. His EF at that time was 35%. Recent echo revealed his EF to be in the 50th 55% without segmental wall motion and modalities and a Myoview stress test performed 06/18/16 was nonischemic. He denies chest pain or shortness of breath.  Hyperlipidemia History of hyperlipidemia on statin therapy followed by his PCP. We will check a fasting lipid and liver profile.      Lorretta Harp MD FACP,FACC,FAHA, [facility] 01/18/2017 3:33 PM

## 2017-01-18 NOTE — Assessment & Plan Note (Signed)
History of hyperlipidemia on statin therapy followed by his PCP. We will check a fasting lipid and liver profile.

## 2017-02-04 ENCOUNTER — Encounter (INDEPENDENT_AMBULATORY_CARE_PROVIDER_SITE_OTHER): Payer: BLUE CROSS/BLUE SHIELD | Admitting: Ophthalmology

## 2017-02-04 DIAGNOSIS — H35033 Hypertensive retinopathy, bilateral: Secondary | ICD-10-CM | POA: Diagnosis not present

## 2017-02-04 DIAGNOSIS — I10 Essential (primary) hypertension: Secondary | ICD-10-CM | POA: Diagnosis not present

## 2017-02-04 DIAGNOSIS — H43813 Vitreous degeneration, bilateral: Secondary | ICD-10-CM | POA: Diagnosis not present

## 2017-02-04 DIAGNOSIS — H353221 Exudative age-related macular degeneration, left eye, with active choroidal neovascularization: Secondary | ICD-10-CM | POA: Diagnosis not present

## 2017-04-29 ENCOUNTER — Encounter (INDEPENDENT_AMBULATORY_CARE_PROVIDER_SITE_OTHER): Payer: BLUE CROSS/BLUE SHIELD | Admitting: Ophthalmology

## 2017-04-29 DIAGNOSIS — H2513 Age-related nuclear cataract, bilateral: Secondary | ICD-10-CM | POA: Diagnosis not present

## 2017-04-29 DIAGNOSIS — H32 Chorioretinal disorders in diseases classified elsewhere: Secondary | ICD-10-CM

## 2017-04-29 DIAGNOSIS — H43813 Vitreous degeneration, bilateral: Secondary | ICD-10-CM

## 2017-04-29 DIAGNOSIS — B399 Histoplasmosis, unspecified: Secondary | ICD-10-CM | POA: Diagnosis not present

## 2017-04-29 DIAGNOSIS — H353111 Nonexudative age-related macular degeneration, right eye, early dry stage: Secondary | ICD-10-CM

## 2017-04-29 DIAGNOSIS — H353221 Exudative age-related macular degeneration, left eye, with active choroidal neovascularization: Secondary | ICD-10-CM

## 2017-07-22 ENCOUNTER — Encounter (INDEPENDENT_AMBULATORY_CARE_PROVIDER_SITE_OTHER): Payer: BLUE CROSS/BLUE SHIELD | Admitting: Ophthalmology

## 2017-07-22 DIAGNOSIS — I10 Essential (primary) hypertension: Secondary | ICD-10-CM

## 2017-07-22 DIAGNOSIS — H43813 Vitreous degeneration, bilateral: Secondary | ICD-10-CM | POA: Diagnosis not present

## 2017-07-22 DIAGNOSIS — H353111 Nonexudative age-related macular degeneration, right eye, early dry stage: Secondary | ICD-10-CM | POA: Diagnosis not present

## 2017-07-22 DIAGNOSIS — H35033 Hypertensive retinopathy, bilateral: Secondary | ICD-10-CM | POA: Diagnosis not present

## 2017-07-22 DIAGNOSIS — H353221 Exudative age-related macular degeneration, left eye, with active choroidal neovascularization: Secondary | ICD-10-CM | POA: Diagnosis not present

## 2017-08-19 ENCOUNTER — Encounter (INDEPENDENT_AMBULATORY_CARE_PROVIDER_SITE_OTHER): Payer: BLUE CROSS/BLUE SHIELD | Admitting: Ophthalmology

## 2017-08-19 DIAGNOSIS — H35033 Hypertensive retinopathy, bilateral: Secondary | ICD-10-CM

## 2017-08-19 DIAGNOSIS — H43813 Vitreous degeneration, bilateral: Secondary | ICD-10-CM | POA: Diagnosis not present

## 2017-08-19 DIAGNOSIS — H353111 Nonexudative age-related macular degeneration, right eye, early dry stage: Secondary | ICD-10-CM | POA: Diagnosis not present

## 2017-08-19 DIAGNOSIS — H32 Chorioretinal disorders in diseases classified elsewhere: Secondary | ICD-10-CM

## 2017-08-19 DIAGNOSIS — B399 Histoplasmosis, unspecified: Secondary | ICD-10-CM

## 2017-08-19 DIAGNOSIS — I10 Essential (primary) hypertension: Secondary | ICD-10-CM | POA: Diagnosis not present

## 2017-08-19 DIAGNOSIS — H353221 Exudative age-related macular degeneration, left eye, with active choroidal neovascularization: Secondary | ICD-10-CM

## 2017-08-23 ENCOUNTER — Other Ambulatory Visit: Payer: Self-pay | Admitting: Cardiovascular Disease

## 2017-09-16 ENCOUNTER — Encounter (INDEPENDENT_AMBULATORY_CARE_PROVIDER_SITE_OTHER): Payer: BLUE CROSS/BLUE SHIELD | Admitting: Ophthalmology

## 2017-09-16 DIAGNOSIS — H353221 Exudative age-related macular degeneration, left eye, with active choroidal neovascularization: Secondary | ICD-10-CM | POA: Diagnosis not present

## 2017-09-16 DIAGNOSIS — H43813 Vitreous degeneration, bilateral: Secondary | ICD-10-CM | POA: Diagnosis not present

## 2017-09-16 DIAGNOSIS — B399 Histoplasmosis, unspecified: Secondary | ICD-10-CM

## 2017-09-16 DIAGNOSIS — H2512 Age-related nuclear cataract, left eye: Secondary | ICD-10-CM | POA: Diagnosis not present

## 2017-09-16 DIAGNOSIS — I10 Essential (primary) hypertension: Secondary | ICD-10-CM | POA: Diagnosis not present

## 2017-09-16 DIAGNOSIS — H32 Chorioretinal disorders in diseases classified elsewhere: Secondary | ICD-10-CM | POA: Diagnosis not present

## 2017-09-16 DIAGNOSIS — H353111 Nonexudative age-related macular degeneration, right eye, early dry stage: Secondary | ICD-10-CM | POA: Diagnosis not present

## 2017-09-16 DIAGNOSIS — H35033 Hypertensive retinopathy, bilateral: Secondary | ICD-10-CM

## 2017-10-14 ENCOUNTER — Encounter (INDEPENDENT_AMBULATORY_CARE_PROVIDER_SITE_OTHER): Payer: BLUE CROSS/BLUE SHIELD | Admitting: Ophthalmology

## 2017-10-14 DIAGNOSIS — H353111 Nonexudative age-related macular degeneration, right eye, early dry stage: Secondary | ICD-10-CM | POA: Diagnosis not present

## 2017-10-14 DIAGNOSIS — H353221 Exudative age-related macular degeneration, left eye, with active choroidal neovascularization: Secondary | ICD-10-CM

## 2017-10-14 DIAGNOSIS — I10 Essential (primary) hypertension: Secondary | ICD-10-CM

## 2017-10-14 DIAGNOSIS — B399 Histoplasmosis, unspecified: Secondary | ICD-10-CM

## 2017-10-14 DIAGNOSIS — H43813 Vitreous degeneration, bilateral: Secondary | ICD-10-CM

## 2017-10-14 DIAGNOSIS — H32 Chorioretinal disorders in diseases classified elsewhere: Secondary | ICD-10-CM

## 2017-10-14 DIAGNOSIS — H35033 Hypertensive retinopathy, bilateral: Secondary | ICD-10-CM

## 2017-11-16 ENCOUNTER — Encounter: Payer: Self-pay | Admitting: Family Medicine

## 2017-11-16 ENCOUNTER — Ambulatory Visit: Payer: BLUE CROSS/BLUE SHIELD | Admitting: Family Medicine

## 2017-11-16 VITALS — BP 130/68 | HR 62 | Temp 98.8°F | Ht 67.5 in | Wt 164.0 lb

## 2017-11-16 DIAGNOSIS — E039 Hypothyroidism, unspecified: Secondary | ICD-10-CM | POA: Insufficient documentation

## 2017-11-16 DIAGNOSIS — Z23 Encounter for immunization: Secondary | ICD-10-CM

## 2017-11-16 DIAGNOSIS — Z1159 Encounter for screening for other viral diseases: Secondary | ICD-10-CM | POA: Diagnosis not present

## 2017-11-16 DIAGNOSIS — R5381 Other malaise: Secondary | ICD-10-CM

## 2017-11-16 DIAGNOSIS — Z114 Encounter for screening for human immunodeficiency virus [HIV]: Secondary | ICD-10-CM | POA: Diagnosis not present

## 2017-11-16 DIAGNOSIS — Z9189 Other specified personal risk factors, not elsewhere classified: Secondary | ICD-10-CM | POA: Diagnosis not present

## 2017-11-16 DIAGNOSIS — E038 Other specified hypothyroidism: Secondary | ICD-10-CM

## 2017-11-16 DIAGNOSIS — E785 Hyperlipidemia, unspecified: Secondary | ICD-10-CM | POA: Diagnosis not present

## 2017-11-16 DIAGNOSIS — Z951 Presence of aortocoronary bypass graft: Secondary | ICD-10-CM

## 2017-11-16 DIAGNOSIS — R5383 Other fatigue: Secondary | ICD-10-CM | POA: Diagnosis not present

## 2017-11-16 DIAGNOSIS — E8881 Metabolic syndrome: Secondary | ICD-10-CM | POA: Diagnosis not present

## 2017-11-16 DIAGNOSIS — E88819 Insulin resistance, unspecified: Secondary | ICD-10-CM

## 2017-11-16 DIAGNOSIS — H3530 Unspecified macular degeneration: Secondary | ICD-10-CM | POA: Diagnosis not present

## 2017-11-16 MED ORDER — TETANUS-DIPHTH-ACELL PERTUSSIS 5-2.5-18.5 LF-MCG/0.5 IM SUSP
0.5000 mL | Freq: Once | INTRAMUSCULAR | Status: AC
  Administered 2017-11-16: 0.5 mL via INTRAMUSCULAR

## 2017-11-16 MED ORDER — METFORMIN HCL 500 MG PO TABS: 500.0000 mg | ORAL_TABLET | Freq: Every day | ORAL | 1 refills | Status: DC

## 2017-11-16 NOTE — Progress Notes (Signed)
Shane [name] is a 52 y.o. male is here TO ESTABLISH CARE.  History of Present Illness:   [name], CMA acting as scribe for Dr. Briscoe Deutscher.   [id number] here to establish care. He is followed by cardiology and had strong cardiac history. He gets injections in eye every five weeks for macular degeneration. Has history of hodgkin's. Last seen oncology in 2017.  His questioner for depression did show some symptoms. He was given contact information for [name] for therapy if he feels like he would like to talk to someone.  Wife is in office with him and is concerned that his activity level has decreased tremendously in the last few years. He is agreed that his level of activity has been decreased but he is working on increasing activity.  About a week ago he did notice a spot around where the tumor for his original Hodgkin's was located that is uncomfortable. He has noticed some skin changes in that area that felt like a scab. That is to left of scar.   Health Maintenance Due  Topic Date Due  . URINE MICROALBUMIN  11/28/1974  . HIV Screening  11/28/1979  . TETANUS/TDAP  11/28/1983   Depression screen PHQ 2/9 11/16/2017  Decreased Interest 0  Down, Depressed, Hopeless 1  PHQ - 2 Score 1  Altered sleeping 1  Tired, decreased energy 1  Change in appetite 0  Feeling bad or failure about yourself  0  Trouble concentrating 1  Moving slowly or fidgety/restless 3  Suicidal thoughts 0  PHQ-9 Score 7  Difficult doing work/chores Somewhat difficult     PMHx, SurgHx, SocialHx, FamHx, Medications, and Allergies were reviewed in the Visit Navigator and updated as appropriate.   Patient Active Problem List   Diagnosis Date Noted  . S/P CABG (coronary artery bypass graft) 11/16/2017  . Macular degeneration   . Diabetes mellitus ([place])   . PVC's (premature ventricular contractions) 09/11/2014  . CAD (coronary artery disease) of artery bypass graft 05/24/2014  . Hyperlipidemia  05/24/2014  . Hodgkin disease ([place]) 07/13/1995   Social History   Tobacco Use  . Smoking status: Never Smoker  . Smokeless tobacco: Former Systems developer  . Tobacco comment: a long time ago  Substance Use Topics  . Alcohol use: Yes    Alcohol/week: 7.2 oz    Types: 12 Cans of beer per week  . Drug use: No   Current Medications and Allergies:    .  aspirin 81 MG tablet, Take 81 mg by mouth daily., Disp: , Rfl:  .  BESIVANCE 0.6 % SUSP, as needed. , Disp: , Rfl: 1 .  carvedilol (COREG) 6.25 MG tablet, Take 1 tablet by mouth twice daily., Disp: 180 tablet, Rfl: 2 .  ibuprofen (ADVIL,MOTRIN) 200 MG tablet, Take 200 mg by mouth every 6 (six) hours as needed., Disp: , Rfl:  .  metFORMIN (GLUCOPHAGE) 500 MG tablet, Take 500 mg by mouth daily with breakfast., Disp: , Rfl:  .  nitroGLYCERIN (NITROSTAT) 0.4 MG SL tablet, Place 1 tablet (0.4 mg total) under the tongue every 5 (five) minutes as needed for chest pain., Disp: 25 tablet, Rfl: 6 .  NONFORMULARY OR COMPOUNDED ITEM, Shots for macular degeneration left eye, Disp: , Rfl:  .  rosuvastatin (CRESTOR) 20 MG tablet, TAKE ONE-HALF TABLET BY MOUTH ONCE DAILY, Disp: 15 tablet, Rfl: 11 .  zolpidem (AMBIEN) 10 MG tablet, Take 10 mg by mouth at bedtime as needed. , Disp: , Rfl: 0  No Known Allergies   Review of Systems   Pertinent items are noted in the HPI. Otherwise, ROS is negative.  Vitals:   Vitals:   11/16/17 1404  BP: 130/68  Pulse: 62  Temp: 98.8 F (37.1 C)  TempSrc: Oral  SpO2: 97%  Weight: 164 lb (74.4 kg)  Height: 5' 7.5" (1.715 m)     Body mass index is 25.31 kg/m.   Physical Exam:   Physical Exam  Constitutional: He is oriented to person, place, and time. He appears well-developed and well-nourished. No distress.  HENT:  Head: Normocephalic and atraumatic.  Right Ear: External ear normal.  Left Ear: External ear normal.  Nose: Nose normal.  Mouth/Throat: Oropharynx is clear and moist.  Eyes: Pupils are equal, round,  and reactive to light. Conjunctivae and EOM are normal.  Neck: Normal range of motion. Neck supple.  Cardiovascular: Normal rate, regular rhythm, normal heart sounds and intact distal pulses.  Pulmonary/Chest: Effort normal and breath sounds normal.  Abdominal: Soft. Bowel sounds are normal.  Musculoskeletal: Normal range of motion.  Neurological: He is alert and oriented to person, place, and time.  Skin: Skin is warm and dry.  Psychiatric: He has a normal mood and affect. His behavior is normal. Judgment and thought content normal.  Nursing note and vitals reviewed.   Assessment and Plan:   Arvil was seen today for establish care.  Diagnoses and all orders for this visit:  Insulin resistance -     metFORMIN (GLUCOPHAGE) 500 MG tablet; Take 1 tablet (500 mg total) by mouth daily with breakfast. -     Hemoglobin A1c -     CBC with Differential/Platelet  S/P CABG (coronary artery bypass graft)  Hyperlipidemia, unspecified hyperlipidemia type -     Lipid panel -     Cancel: Hepatic function panel -     Comprehensive metabolic panel  Encounter for screening for HIV -     HIV antibody  Need for Tdap vaccination -     Tdap (BOOSTRIX) injection 0.5 mL  Malaise and fatigue -     Tdap (BOOSTRIX) injection 0.5 mL -     HIV antibody -     Lipid panel -     Hemoglobin A1c -     TSH -     T4, free -     Thyroid peroxidase antibody -     Sedimentation rate -     C-reactive protein  Subclinical hypothyroidism -     TSH -     T4, free -     Thyroid peroxidase antibody  Encounter for hepatitis C virus screening test for high risk patient    . Reviewed expectations re: course of current medical issues. . Discussed self-management of symptoms. . Outlined signs and symptoms indicating need for more acute intervention. . Patient verbalized understanding and all questions were answered. Marland Kitchen Health Maintenance issues including appropriate healthy diet, exercise, and smoking  avoidance were discussed with patient. . See orders for this visit as documented in the electronic medical record. . Patient received an After Visit Summary.  Briscoe Deutscher, DO [place], [facility] 11/16/2017  Future Appointments  Date Time Provider [place]  11/18/2017  8:45 AM Hayden Pedro, MD [facility] None   CMA served as scribe during this visit. History, Physical, and Plan performed by medical provider. The above documentation has been reviewed and is accurate and complete. Briscoe Deutscher, D.O.  Records requested if needed. Time spent with the  patient: 45 minutes, of which >50% was spent in obtaining information about his symptoms, reviewing his previous labs, evaluations, and treatments, counseling him about his condition (please see the discussed topics above), and developing a plan to further investigate it; he had a number of questions which I addressed.

## 2017-11-17 LAB — COMPREHENSIVE METABOLIC PANEL
ALT: 36 U/L (ref 0–53)
AST: 25 U/L (ref 0–37)
Albumin: 4.7 g/dL (ref 3.5–5.2)
Alkaline Phosphatase: 41 U/L (ref 39–117)
BUN: 15 mg/dL (ref 6–23)
CO2: 26 mEq/L (ref 19–32)
Calcium: 9.8 mg/dL (ref 8.4–10.5)
Chloride: 102 mEq/L (ref 96–112)
Creatinine, Ser: 0.94 mg/dL (ref 0.40–1.50)
GFR: 89.24 mL/min (ref >60.00)
Glucose, Bld: 84 mg/dL (ref 70–99)
Potassium: 4.3 mEq/L (ref 3.5–5.1)
Sodium: 138 mEq/L (ref 135–145)
Total Bilirubin: 1.1 mg/dL (ref 0.2–1.2)
Total Protein: 7.2 g/dL (ref 6.0–8.3)

## 2017-11-17 LAB — LIPID PANEL
Cholesterol: 149 mg/dL (ref 0–200)
HDL: 55.8 mg/dL (ref >39.00)
LDL Cholesterol: 69 mg/dL (ref 0–99)
NonHDL: 92.76
Total CHOL/HDL Ratio: 3
Triglycerides: 120 mg/dL (ref 0.0–149.0)
VLDL: 24 mg/dL (ref 0.0–40.0)

## 2017-11-17 LAB — CBC WITH DIFFERENTIAL/PLATELET
Basophils Absolute: 0.1 10*3/uL (ref 0.0–0.1)
Basophils Relative: 0.9 % (ref 0.0–3.0)
Eosinophils Absolute: 0.1 10*3/uL (ref 0.0–0.7)
Eosinophils Relative: 1.6 % (ref 0.0–5.0)
HCT: 43.6 % (ref 39.0–52.0)
Hemoglobin: 15 g/dL (ref 13.0–17.0)
Lymphocytes Relative: 24.7 % (ref 12.0–46.0)
Lymphs Abs: 1.5 10*3/uL (ref 0.7–4.0)
MCHC: 34.5 g/dL (ref 30.0–36.0)
MCV: 95.3 fl (ref 78.0–100.0)
Monocytes Absolute: 0.5 10*3/uL (ref 0.1–1.0)
Monocytes Relative: 7.5 % (ref 3.0–12.0)
Neutro Abs: 4 10*3/uL (ref 1.4–7.7)
Neutrophils Relative %: 65.3 % (ref 43.0–77.0)
Platelets: 288 10*3/uL (ref 150.0–400.0)
RBC: 4.58 Mil/uL (ref 4.22–5.81)
RDW: 13 % (ref 11.5–15.5)
WBC: 6 10*3/uL (ref 4.0–10.5)

## 2017-11-17 LAB — THYROID PEROXIDASE ANTIBODY: Thyroperoxidase Ab SerPl-aCnc: <1 IU/mL (ref <9)

## 2017-11-17 LAB — SEDIMENTATION RATE: Sed Rate: 12 mm/hr (ref 0–20)

## 2017-11-17 LAB — HIV ANTIBODY (ROUTINE TESTING W REFLEX): HIV 1&2 Ab, 4th Generation: NONREACTIVE

## 2017-11-17 LAB — HEMOGLOBIN A1C: Hgb A1c MFr Bld: 5.7 % (ref 4.6–6.5)

## 2017-11-17 LAB — C-REACTIVE PROTEIN: CRP: 0.4 mg/dL — ABNORMAL LOW (ref 0.5–20.0)

## 2017-11-17 LAB — T4, FREE: Free T4: 0.76 ng/dL (ref 0.60–1.60)

## 2017-11-17 LAB — TSH: TSH: 5.64 u[IU]/mL — ABNORMAL HIGH (ref 0.35–4.50)

## 2017-11-18 ENCOUNTER — Encounter (INDEPENDENT_AMBULATORY_CARE_PROVIDER_SITE_OTHER): Payer: BLUE CROSS/BLUE SHIELD | Admitting: Ophthalmology

## 2017-11-18 DIAGNOSIS — H35033 Hypertensive retinopathy, bilateral: Secondary | ICD-10-CM | POA: Diagnosis not present

## 2017-11-18 DIAGNOSIS — H2512 Age-related nuclear cataract, left eye: Secondary | ICD-10-CM

## 2017-11-18 DIAGNOSIS — I10 Essential (primary) hypertension: Secondary | ICD-10-CM | POA: Diagnosis not present

## 2017-11-18 DIAGNOSIS — H43813 Vitreous degeneration, bilateral: Secondary | ICD-10-CM

## 2017-11-18 DIAGNOSIS — H353111 Nonexudative age-related macular degeneration, right eye, early dry stage: Secondary | ICD-10-CM

## 2017-11-18 DIAGNOSIS — H353221 Exudative age-related macular degeneration, left eye, with active choroidal neovascularization: Secondary | ICD-10-CM

## 2017-12-23 ENCOUNTER — Encounter (INDEPENDENT_AMBULATORY_CARE_PROVIDER_SITE_OTHER): Payer: BLUE CROSS/BLUE SHIELD | Admitting: Ophthalmology

## 2017-12-29 ENCOUNTER — Encounter (INDEPENDENT_AMBULATORY_CARE_PROVIDER_SITE_OTHER): Payer: BLUE CROSS/BLUE SHIELD | Admitting: Ophthalmology

## 2017-12-29 DIAGNOSIS — H43813 Vitreous degeneration, bilateral: Secondary | ICD-10-CM

## 2017-12-29 DIAGNOSIS — H353111 Nonexudative age-related macular degeneration, right eye, early dry stage: Secondary | ICD-10-CM

## 2017-12-29 DIAGNOSIS — H35033 Hypertensive retinopathy, bilateral: Secondary | ICD-10-CM

## 2017-12-29 DIAGNOSIS — H2513 Age-related nuclear cataract, bilateral: Secondary | ICD-10-CM | POA: Diagnosis not present

## 2017-12-29 DIAGNOSIS — H32 Chorioretinal disorders in diseases classified elsewhere: Secondary | ICD-10-CM | POA: Diagnosis not present

## 2017-12-29 DIAGNOSIS — I10 Essential (primary) hypertension: Secondary | ICD-10-CM

## 2017-12-29 DIAGNOSIS — B399 Histoplasmosis, unspecified: Secondary | ICD-10-CM | POA: Diagnosis not present

## 2017-12-29 DIAGNOSIS — H353221 Exudative age-related macular degeneration, left eye, with active choroidal neovascularization: Secondary | ICD-10-CM | POA: Diagnosis not present

## 2018-02-03 ENCOUNTER — Encounter (INDEPENDENT_AMBULATORY_CARE_PROVIDER_SITE_OTHER): Payer: BLUE CROSS/BLUE SHIELD | Admitting: Ophthalmology

## 2018-02-03 DIAGNOSIS — H35033 Hypertensive retinopathy, bilateral: Secondary | ICD-10-CM

## 2018-02-03 DIAGNOSIS — H353221 Exudative age-related macular degeneration, left eye, with active choroidal neovascularization: Secondary | ICD-10-CM | POA: Diagnosis not present

## 2018-02-03 DIAGNOSIS — I10 Essential (primary) hypertension: Secondary | ICD-10-CM | POA: Diagnosis not present

## 2018-02-03 DIAGNOSIS — H353111 Nonexudative age-related macular degeneration, right eye, early dry stage: Secondary | ICD-10-CM

## 2018-02-03 DIAGNOSIS — H43813 Vitreous degeneration, bilateral: Secondary | ICD-10-CM

## 2018-03-09 ENCOUNTER — Encounter (INDEPENDENT_AMBULATORY_CARE_PROVIDER_SITE_OTHER): Payer: BLUE CROSS/BLUE SHIELD | Admitting: Ophthalmology

## 2018-03-09 DIAGNOSIS — H353221 Exudative age-related macular degeneration, left eye, with active choroidal neovascularization: Secondary | ICD-10-CM | POA: Diagnosis not present

## 2018-03-09 DIAGNOSIS — H43813 Vitreous degeneration, bilateral: Secondary | ICD-10-CM

## 2018-03-09 DIAGNOSIS — H353111 Nonexudative age-related macular degeneration, right eye, early dry stage: Secondary | ICD-10-CM

## 2018-03-09 DIAGNOSIS — H2512 Age-related nuclear cataract, left eye: Secondary | ICD-10-CM

## 2018-03-09 DIAGNOSIS — H35033 Hypertensive retinopathy, bilateral: Secondary | ICD-10-CM

## 2018-03-09 DIAGNOSIS — I10 Essential (primary) hypertension: Secondary | ICD-10-CM | POA: Diagnosis not present

## 2018-03-28 ENCOUNTER — Encounter: Payer: Self-pay | Admitting: Family Medicine

## 2018-04-03 ENCOUNTER — Encounter: Payer: Self-pay | Admitting: Family Medicine

## 2018-04-03 ENCOUNTER — Ambulatory Visit: Payer: BLUE CROSS/BLUE SHIELD | Admitting: Family Medicine

## 2018-04-03 VITALS — BP 130/64 | HR 86 | Temp 98.3°F | Ht 67.5 in | Wt 166.4 lb

## 2018-04-03 DIAGNOSIS — F410 Panic disorder [episodic paroxysmal anxiety] without agoraphobia: Secondary | ICD-10-CM | POA: Diagnosis not present

## 2018-04-03 DIAGNOSIS — Z23 Encounter for immunization: Secondary | ICD-10-CM | POA: Diagnosis not present

## 2018-04-03 DIAGNOSIS — F321 Major depressive disorder, single episode, moderate: Secondary | ICD-10-CM

## 2018-04-03 MED ORDER — LORAZEPAM 0.5 MG PO TABS: 0.5000 mg | ORAL_TABLET | Freq: Two times a day (BID) | ORAL | 1 refills | Status: DC | PRN

## 2018-04-03 MED ORDER — ESCITALOPRAM OXALATE 10 MG PO TABS: 10.0000 mg | ORAL_TABLET | Freq: Every day | ORAL | 1 refills | Status: DC

## 2018-04-03 NOTE — Progress Notes (Signed)
Shane [name] is a 52 y.o. male is here for follow up.  History of Present Illness:   Shane [name], CMA acting as scribe for Shane. Briscoe Deutscher.   Shane [name] in office for evaluation on depression that is increasing anxiety. He was on Zoloft in the past but has been about 12 years ago. It helped a lot. He has noticed changes in last few months. He has been given information for [facility]. Issues stem from wife with concussions and bipolar. Difficult to focus on self care and his mental health. No SI.  MyChart Message from patient:   Shane [name] my depression has gotten to unhealthy levels. I cannot control my emotions and anger. What are some options for me?    Shane [name] and I also need couples therapy or counseling to help [place] together. Any suggestions or referrals are much appreciated.   Depression screen Shane [name] 2/9 04/03/2018 11/16/2017  Decreased Interest 1 0  Down, Depressed, Hopeless 3 1  PHQ - 2 Score 4 1  Altered sleeping 0 1  Tired, decreased energy 1 1  Change in appetite 1 0  Feeling bad or failure about yourself  0 0  Trouble concentrating 0 1  Moving slowly or fidgety/restless 0 3  Suicidal thoughts 0 0  PHQ-9 Score 6 7  Difficult doing work/chores Extremely dIfficult Somewhat difficult   GAD 7 : Generalized Anxiety Score 04/03/2018  Nervous, Anxious, on Edge 2  Control/stop worrying 1  Worry too much - different things 1  Trouble relaxing 1  Restless 1  Easily annoyed or irritable 3  Afraid - awful might happen 0  Total GAD 7 Score 9  Anxiety Difficulty Very difficult   Health Maintenance Due  Topic Date Due  . INFLUENZA VACCINE  02/09/2018   Depression screen [name] 2/9 04/03/2018 11/16/2017  Decreased Interest 1 0  Down, Depressed, Hopeless 3 1  PHQ - 2 Score 4 1  Altered sleeping 0 1  Tired, decreased energy 1 1  Change in appetite 1 0  Feeling bad or failure about yourself  0 0  Trouble concentrating 0 1  Moving slowly or fidgety/restless 0 3  Suicidal  thoughts 0 0  PHQ-9 Score 6 7  Difficult doing work/chores Extremely dIfficult Somewhat difficult   PMHx, SurgHx, SocialHx, FamHx, Medications, and Allergies were reviewed in the Visit Navigator and updated as appropriate.   Patient Active Problem List   Diagnosis Date Noted  . S/P CABG (coronary artery bypass graft) 11/16/2017  . Insulin resistance 11/16/2017  . Subclinical hypothyroidism 11/16/2017  . Macular degeneration   . PVC's (premature ventricular contractions) 09/11/2014  . CAD (coronary artery disease) of artery bypass graft 05/24/2014  . Hyperlipidemia 05/24/2014  . Hodgkin disease ([place]) 07/13/1995   Social History   Tobacco Use  . Smoking status: Never Smoker  . Smokeless tobacco: Former Systems developer  . Tobacco comment: a long time ago  Substance Use Topics  . Alcohol use: Yes    Alcohol/week: 12.0 standard drinks    Types: 12 Cans of beer per week  . Drug use: No   Current Medications and Allergies:   .  aspirin 81 MG tablet, Take 81 mg by mouth daily., Disp: , Rfl:  .  BESIVANCE 0.6 % SUSP, as needed. , Disp: , Rfl: 1 .  carvedilol (COREG) 6.25 MG tablet, Take 1 tablet by mouth twice daily., Disp: 180 tablet, Rfl: 2 .  ibuprofen (ADVIL,MOTRIN) 200 MG tablet, Take 200 mg by mouth every 6 (  six) hours as needed., Disp: , Rfl:  .  metFORMIN (GLUCOPHAGE) 500 MG tablet, Take 1 tablet (500 mg total) by mouth daily with breakfast., Disp: 90 tablet, Rfl: 1 .  nitroGLYCERIN (NITROSTAT) 0.4 MG SL tablet, Place 1 tablet (0.4 mg total) under the tongue every 5 (five) minutes as needed for chest pain., Disp: 25 tablet, Rfl: 6 .  NONFORMULARY OR COMPOUNDED ITEM, Shots for macular degeneration left eye, Disp: , Rfl:  .  rosuvastatin (CRESTOR) 20 MG tablet, Take 20 mg by mouth daily., Disp: , Rfl:   No Known Allergies   Review of Systems   Pertinent items are noted in the HPI. Otherwise, ROS is negative.  Vitals:   Vitals:   04/03/18 0824  BP: 130/64  Pulse: 86  Temp: 98.3  F (36.8 C)  TempSrc: Oral  SpO2: 98%  Weight: 166 lb 6.4 oz (75.5 kg)  Height: 5' 7.5" (1.715 m)     Body mass index is 25.68 kg/m.  Physical Exam:   Physical Exam  Constitutional: He is oriented to person, place, and time. He appears well-developed and well-nourished. No distress.  HENT:  Head: Normocephalic and atraumatic.  Right Ear: External ear normal.  Left Ear: External ear normal.  Nose: Nose normal.  Mouth/Throat: Oropharynx is clear and moist.  Eyes: Pupils are equal, round, and reactive to light. Conjunctivae and EOM are normal.  Neck: Normal range of motion. Neck supple.  Cardiovascular: Normal rate, regular rhythm, normal heart sounds and intact distal pulses.  Pulmonary/Chest: Effort normal and breath sounds normal.  Abdominal: Soft. Bowel sounds are normal.  Musculoskeletal: Normal range of motion.  Neurological: He is alert and oriented to person, place, and time.  Skin: Skin is warm and dry.  Psychiatric: He has a normal mood and affect. His behavior is normal. Judgment and thought content normal.  Nursing note and vitals reviewed.  Assessment and Plan:   Shane [name] was seen today for anxiety.  Diagnoses and all orders for this visit:  Depression, major, single episode, moderate (HCC) -     escitalopram (LEXAPRO) 10 MG tablet; Take 1 tablet (10 mg total) by mouth daily. -     LORazepam (ATIVAN) 0.5 MG tablet; Take 1 tablet (0.5 mg total) by mouth 2 (two) times daily as needed for anxiety. - To Shane [name] for counseling.  Anxiety attack -     escitalopram (LEXAPRO) 10 MG tablet; Take 1 tablet (10 mg total) by mouth daily. -     LORazepam (ATIVAN) 0.5 MG tablet; Take 1 tablet (0.5 mg total) by mouth 2 (two) times daily as needed for anxiety.  Need for immunization against influenza -     Flu Vaccine QUAD 36+ mos IM  . Reviewed expectations re: course of current medical issues. . Discussed self-management of symptoms. . Outlined signs and symptoms indicating  need for more acute intervention. . Patient verbalized understanding and all questions were answered. Marland Kitchen Health Maintenance issues including appropriate healthy diet, exercise, and smoking avoidance were discussed with patient. . See orders for this visit as documented in the electronic medical record. . Patient received an After Visit Summary.  CMA served as Education administrator during this visit. History, Physical, and Plan performed by medical provider. The above documentation has been reviewed and is accurate and complete. Briscoe Deutscher, D.O.  Briscoe Deutscher, DO [place], [name] 04/03/2018

## 2018-04-07 DIAGNOSIS — F331 Major depressive disorder, recurrent, moderate: Secondary | ICD-10-CM | POA: Diagnosis not present

## 2018-04-14 ENCOUNTER — Encounter (INDEPENDENT_AMBULATORY_CARE_PROVIDER_SITE_OTHER): Payer: BLUE CROSS/BLUE SHIELD | Admitting: Ophthalmology

## 2018-04-14 DIAGNOSIS — H35033 Hypertensive retinopathy, bilateral: Secondary | ICD-10-CM | POA: Diagnosis not present

## 2018-04-14 DIAGNOSIS — B399 Histoplasmosis, unspecified: Secondary | ICD-10-CM

## 2018-04-14 DIAGNOSIS — H32 Chorioretinal disorders in diseases classified elsewhere: Secondary | ICD-10-CM

## 2018-04-14 DIAGNOSIS — H353221 Exudative age-related macular degeneration, left eye, with active choroidal neovascularization: Secondary | ICD-10-CM

## 2018-04-14 DIAGNOSIS — H2512 Age-related nuclear cataract, left eye: Secondary | ICD-10-CM

## 2018-04-14 DIAGNOSIS — H43813 Vitreous degeneration, bilateral: Secondary | ICD-10-CM

## 2018-04-14 DIAGNOSIS — I10 Essential (primary) hypertension: Secondary | ICD-10-CM | POA: Diagnosis not present

## 2018-04-14 DIAGNOSIS — H353111 Nonexudative age-related macular degeneration, right eye, early dry stage: Secondary | ICD-10-CM

## 2018-04-21 ENCOUNTER — Encounter: Payer: Self-pay | Admitting: Cardiovascular Disease

## 2018-04-21 ENCOUNTER — Ambulatory Visit: Payer: BLUE CROSS/BLUE SHIELD | Admitting: Cardiovascular Disease

## 2018-04-21 VITALS — BP 122/70 | HR 67 | Ht 67.5 in | Wt 164.8 lb

## 2018-04-21 DIAGNOSIS — E7800 Pure hypercholesterolemia, unspecified: Secondary | ICD-10-CM | POA: Diagnosis not present

## 2018-04-21 DIAGNOSIS — I25700 Atherosclerosis of coronary artery bypass graft(s), unspecified, with unstable angina pectoris: Secondary | ICD-10-CM | POA: Diagnosis not present

## 2018-04-21 NOTE — Assessment & Plan Note (Signed)
History of CAD status post anterior wall microinfarction 08/28/2009 treated with PCI and drug-eluting stenting using a Xience DES stent in the proximal LAD.  Mycotic, because of recurrent symptoms he underwent cardiac catheterization in August 2015 and underwent bypass grafting 02/27/2014 with a LIMA to his LAD, vein to ramus branch, obtuse marginal branch and RCA.  His most initial EF was 35% which improved over time by recent 2D echo 06/28/2016 revealing normal LV function.  Since I saw him a year ago he is remained asymptomatic.

## 2018-04-21 NOTE — Patient Instructions (Signed)
Medication Instructions:  NO CHANGE If you need a refill on your cardiac medications before your next appointment, please call your pharmacy.   Lab work: If you have labs (blood work) drawn today and your tests are completely normal, you will receive your results only by: . MyChart Message (if you have MyChart) OR . A paper copy in the mail If you have any lab test that is abnormal or we need to change your treatment, we will call you to review the results.  Follow-Up: At [facility], you and your health needs are our priority.  As part of our continuing mission to provide you with exceptional heart care, we have created designated Provider Care Teams.  These Care Teams include your primary Cardiologist (physician) and Advanced Practice Providers (APPs -  Physician Assistants and Nurse Practitioners) who all work together to provide you with the care you need, when you need it. You will need a follow up appointment in 12 months.  Please call our office 2 months in advance to schedule this appointment.  You may see JONATHAN BERRY MD or one of the following Advanced Practice Providers on your designated Care Team:   Luke Kilroy, PA-C Krista Kroeger, PA-C . Callie Goodrich, PA-C     

## 2018-04-21 NOTE — Assessment & Plan Note (Signed)
History of hyperlipidemia on statin therapy with lipid profile performed 11/16/2017 revealing total cholesterol 149, LDL 69 HDL 55.

## 2018-04-21 NOTE — Progress Notes (Signed)
04/21/2018 Shane [name]   1965-12-23  [id number]  Primary Physician Shane Deutscher, DO Primary Cardiologist: Shane Harp MD Shane [name], [place]  HPI:  Shane [name] is a 52 y.o.  thin appearing divorced Caucasian male father of 2 children [name] is also a patient of mine unfortunately recently lost her father several days ago. I last saw him in the office  01/18/2017 He works as an Sales promotion account executive. His primary care physician is Dr. Cecille [name]. He recently was married earlier this month and he and [name] honeymoon in [place]. His cardiac risk factor profile is remarkable for hyperlipidemia and family history the father who had bypass surgery at age 23 and a brother who had bypass surgery as well. He suffered an anterior wall myocardial infarction 08/28/09 with a Xience DES stent placed in his proximal LAD. Because of recurrent symptoms he underwent catheterization in August and ultimately coronary bypass grafting 02/27/14 with a LIMA to his LAD, a vein to ramus branch, obtuse marginal branch and the RCA. His ejection fraction was 35%. He recuperated nicely although he did not participate in cardiac rehabilitation. A 2-D echocardiogram performed 08/27/14 revealed an improvement in his ejection fraction up to 50-55% with anterolateral wall motion abnormality. While honeymooning in [place] he noticed prolonged recovery from exercise activity as well as "just not feeling right". He denies chest pain. I performed a Myoview stress test on him after that on 06/28/16 which was low risk and nonischemic and 2-D echo that revealed preserved LV function with EF of 50-55%. Those symptoms have since subsided. Since I saw him a year ago he is remained asymptomatic.  Specifically denies chest pain or shortness of breath.  Current Meds  Medication Sig  . aspirin 81 MG tablet Take 81 mg by mouth daily.  Marland Kitchen BESIVANCE 0.6 % SUSP as needed.   . carvedilol (COREG) 6.25 MG tablet  Take 1 tablet by mouth twice daily.  Marland Kitchen escitalopram (LEXAPRO) 10 MG tablet Take 1 tablet (10 mg total) by mouth daily.  Marland Kitchen ibuprofen (ADVIL,MOTRIN) 200 MG tablet Take 200 mg by mouth every 6 (six) hours as needed.  Marland Kitchen LORazepam (ATIVAN) 0.5 MG tablet Take 1 tablet (0.5 mg total) by mouth 2 (two) times daily as needed for anxiety.  . metFORMIN (GLUCOPHAGE) 500 MG tablet Take 1 tablet (500 mg total) by mouth daily with breakfast.  . nitroGLYCERIN (NITROSTAT) 0.4 MG SL tablet Place 1 tablet (0.4 mg total) under the tongue every 5 (five) minutes as needed for chest pain.  . NONFORMULARY OR COMPOUNDED ITEM Shots for macular degeneration left eye  . rosuvastatin (CRESTOR) 20 MG tablet Take 20 mg by mouth daily.     No Known Allergies  Social History   Socioeconomic History  . Marital status: Married    Spouse name: Not on file  . Number of children: Not on file  . Years of education: Not on file  . Highest education level: Not on file  Occupational History  . Not on file  Social Needs  . Financial resource strain: Not on file  . Food insecurity:    Worry: Not on file    Inability: Not on file  . Transportation needs:    Medical: Not on file    Non-medical: Not on file  Tobacco Use  . Smoking status: Never Smoker  . Smokeless tobacco: Former Systems developer  . Tobacco comment: a long time ago  Substance and Sexual Activity  .  Alcohol use: Yes    Alcohol/week: 12.0 standard drinks    Types: 12 Cans of beer per week  . Drug use: No  . Sexual activity: Not on file  Lifestyle  . Physical activity:    Days per week: Not on file    Minutes per session: Not on file  . Stress: Not on file  Relationships  . Social connections:    Talks on phone: Not on file    Gets together: Not on file    Attends religious service: Not on file    Active member of club or organization: Not on file    Attends meetings of clubs or organizations: Not on file    Relationship status: Not on file  . Intimate  partner violence:    Fear of current or ex partner: Not on file    Emotionally abused: Not on file    Physically abused: Not on file    Forced sexual activity: Not on file  Other Topics Concern  . Not on file  Social History Narrative  . Not on file     Review of Systems: General: negative for chills, fever, night sweats or weight changes.  Cardiovascular: negative for chest pain, dyspnea on exertion, edema, orthopnea, palpitations, paroxysmal nocturnal dyspnea or shortness of breath Dermatological: negative for rash Respiratory: negative for cough or wheezing Urologic: negative for hematuria Abdominal: negative for nausea, vomiting, diarrhea, bright red blood per rectum, melena, or hematemesis Neurologic: negative for visual changes, syncope, or dizziness All other systems reviewed and are otherwise negative except as noted above.    Blood pressure 122/70, pulse 67, height 5' 7.5" (1.715 m), weight 164 lb 12.8 oz (74.8 kg).  General appearance: alert and no distress Neck: no adenopathy, no carotid bruit, no JVD, supple, symmetrical, trachea midline and thyroid not enlarged, symmetric, no tenderness/mass/nodules Lungs: clear to auscultation bilaterally Heart: regular rate and rhythm, S1, S2 normal, no murmur, click, rub or gallop Extremities: extremities normal, atraumatic, no cyanosis or edema Pulses: 2+ and symmetric Skin: Skin color, texture, turgor normal. No rashes or lesions Neurologic: Alert and oriented X 3, normal strength and tone. Normal symmetric reflexes. Normal coordination and gait  EKG sinus rhythm at 67 with left anterior fascicular block and septal Q waves with first-degree AV block.  I personally reviewed this EKG.  ASSESSMENT AND PLAN:   CAD (coronary artery disease) of artery bypass graft History of CAD status post anterior wall microinfarction 08/28/2009 treated with PCI and drug-eluting stenting using a Xience DES stent in the proximal LAD.  Mycotic,  because of recurrent symptoms he underwent cardiac catheterization in August 2015 and underwent bypass grafting 02/27/2014 with a LIMA to his LAD, vein to ramus branch, obtuse marginal branch and RCA.  His most initial EF was 35% which improved over time by recent 2D echo 06/28/2016 revealing normal LV function.  Since I saw him a year ago he is remained asymptomatic.  Hyperlipidemia History of hyperlipidemia on statin therapy with lipid profile performed 11/16/2017 revealing total cholesterol 149, LDL 69 HDL 55.      Shane Harp MD [facility], [facility] 04/21/2018 5:00 PM

## 2018-05-07 DIAGNOSIS — F331 Major depressive disorder, recurrent, moderate: Secondary | ICD-10-CM | POA: Insufficient documentation

## 2018-05-15 ENCOUNTER — Ambulatory Visit: Payer: BLUE CROSS/BLUE SHIELD | Admitting: Family Medicine

## 2018-05-18 ENCOUNTER — Other Ambulatory Visit: Payer: Self-pay | Admitting: Cardiovascular Disease

## 2018-05-18 ENCOUNTER — Ambulatory Visit (INDEPENDENT_AMBULATORY_CARE_PROVIDER_SITE_OTHER): Payer: BLUE CROSS/BLUE SHIELD | Admitting: Psychiatry

## 2018-05-18 DIAGNOSIS — F331 Major depressive disorder, recurrent, moderate: Secondary | ICD-10-CM | POA: Diagnosis not present

## 2018-05-18 NOTE — Telephone Encounter (Signed)
Rx request sent to pharmacy.  

## 2018-05-18 NOTE — Progress Notes (Signed)
      [facility] Counselor/Therapist Progress Note   Patient ID: Shane [name], MRN: [id number]  Date: 05/18/2018  Timespent: 60  minutes   Treatment Type: Individual   Reported Symptoms: anxiety, stress of coping with wife's illness, anger   Mental Status Exam:    Appearance:   Casual     Behavior:  Appropriate and Sharing  Motor:  Normal  Speech/Language:   Normal Rate  Affect:  frustrated  Mood:  frustrated  Thought process:  normal  Thought content:    WNL  Sensory/Perceptual disturbances:    WNL  Orientation:  oriented to person, place, time/date, situation, day of week, month of year and year  Attention:  Good  Concentration:  Good  Memory:  WNL  Fund of knowledge:   Good  Insight:    Good  Judgment:   Good  Impulse Control:  Good     Risk Assessment: Danger to Self:  No Self-injurious Behavior: No Danger to Others: No Duty to Warn:no Physical Aggression / Violence:No  Access to Firearms a concern: No  Gang Involvement:No    Subjective: Patient in today with symptoms of frustration, anxiety, stress of helping wife who struggles with bipolar.  Wife's father died recently which has added stress along with feelings of grief.  Still having some anger but feels it has decreased by 50% since last session. Difficulty "responding to wife when she is having symptoms of her bipolar illness."  Feels that nothing he says or does seems to help the situation.  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 Discussed some communication strategies for patient to use that may be more helpful in supporting his wife---especially really listening to her actively in order to really hear her rather than just to respond.  Reports that his medication seems to be helping him a lot.  Goal review with progress noted, especially the decrease in anger.       Interventions: Solution-Oriented/Positive Psychology and  Ego-Supportive   Diagnosis:   ICD-10-CM   1. Major depressive disorder, recurrent episode, moderate ([place]) F33.1      Plan:  Plan to see patient in 2-3 weeks to continue goal-directed treatment.   [name], LCSW

## 2018-05-18 NOTE — Progress Notes (Deleted)
      [facility] Counselor/Therapist Progress Note   Patient ID: Shane [name], MRN: [id number]  Date: 05/18/2018  Timespent: ***   Treatment Type: {CHL AMB THERAPY TYPES:[id number]}   Reported Symptoms: {PSY:[id number]}   Mental Status Exam:    Appearance:   {PSY:22683}     Behavior:  {PSY:21022743}  Motor:  {PSY:22302}  Speech/Language:   {PSY:22685}  Affect:  {PSY:22687}  Mood:  {PSY:31886}  Thought process:  {PSY:31888}  Thought content:    {[phone]}  Sensory/Perceptual disturbances:    {[phone]}  Orientation:  {PSY:30297}  Attention:  {PSY:22877}  Concentration:  {PSY:930 323 7160}  Memory:  {[phone]}  Fund of knowledge:   {PSY:930 323 7160}  Insight:    {PSY:930 323 7160}  Judgment:   {PSY:930 323 7160}  Impulse Control:  {PSY:930 323 7160}     Risk Assessment: Danger to Self:  {PSY:22692} Self-injurious Behavior: {PSY:22692} Danger to Others: {PSY:22692} Duty to Warn:{PSY:311194} Physical Aggression / Violence:{PSY:21197} Access to Firearms a concern: {PSY:21197} Gang Involvement:{PSY:21197}   Subjective: ***    Interventions: {PSY:[phone]}   Diagnosis:   ICD-10-CM   1. Major depressive disorder, recurrent episode, moderate (HCC) F33.1      Plan: ***   [name], LCSW

## 2018-05-22 NOTE — Progress Notes (Signed)
Shane [name] is a 52 y.o. male is here for follow up.  History of Present Illness:   HPI:   1. Depression, major, single episode, moderate ([place]).  Patient has noticed big improvement after starting lexapro. Has only needed Ativan twice, once when he went to his father-in-laws funeral. No side effects. Following with therapist now.    Depression screen [facility] 2/9 05/24/2018 04/03/2018 11/16/2017  Decreased Interest 0 1 0  Down, Depressed, Hopeless 0 3 1  PHQ - 2 Score 0 4 1  Altered sleeping 0 0 1  Tired, decreased energy 0 1 1  Change in appetite 1 1 0  Feeling bad or failure about yourself  0 0 0  Trouble concentrating 0 0 1  Moving slowly or fidgety/restless 0 0 3  Suicidal thoughts 0 0 0  PHQ-9 Score 1 6 7   Difficult doing work/chores Not difficult at all Extremely dIfficult Somewhat difficult   PMHx, SurgHx, SocialHx, FamHx, Medications, and Allergies were reviewed in the Visit Navigator and updated as appropriate.   Patient Active Problem List   Diagnosis Date Noted  . Anxiety attack 05/25/2018  . Depression, major, single episode, in partial remission ([place]) 05/25/2018  . Major depressive disorder, recurrent episode, moderate ([place]) 05/07/2018  . S/P CABG (coronary artery bypass graft) 11/16/2017  . Insulin resistance 11/16/2017  . Subclinical hypothyroidism 11/16/2017  . Macular degeneration   . PVC's (premature ventricular contractions) 09/11/2014  . CAD (coronary artery disease) of artery bypass graft 05/24/2014  . Hyperlipidemia 05/24/2014  . Hodgkin disease ([place]) 07/13/1995   Social History   Tobacco Use  . Smoking status: Never Smoker  . Smokeless tobacco: Former Systems developer  . Tobacco comment: a long time ago  Substance Use Topics  . Alcohol use: Yes    Alcohol/week: 12.0 standard drinks    Types: 12 Cans of beer per week  . Drug use: No   Current Medications and Allergies:   .  aspirin 81 MG tablet, Take 81 mg by mouth daily., Disp: , Rfl:  .  BESIVANCE  0.6 % SUSP, as needed. , Disp: , Rfl: 1 .  carvedilol (COREG) 6.25 MG tablet, Take 1 tablet by mouth twice daily., Disp: 180 tablet, Rfl: 1 .  escitalopram (LEXAPRO) 10 MG tablet, Take 1 tablet (10 mg total) by mouth daily., Disp: 30 tablet, Rfl: 1 .  ibuprofen (ADVIL,MOTRIN) 200 MG tablet, Take 200 mg by mouth every 6 (six) hours as needed., Disp: , Rfl:  .  LORazepam (ATIVAN) 0.5 MG tablet, Take 1 tablet (0.5 mg total) by mouth 2 (two) times daily as needed for anxiety., Disp: 15 tablet, Rfl: 1 .  metFORMIN (GLUCOPHAGE) 500 MG tablet, Take 1 tablet (500 mg total) by mouth daily with breakfast., Disp: 90 tablet, Rfl: 1 .  nitroGLYCERIN (NITROSTAT) 0.4 MG SL tablet, Place 1 tablet (0.4 mg total) under the tongue every 5 (five) minutes as needed for chest pain., Disp: 25 tablet, Rfl: 6 .  NONFORMULARY OR COMPOUNDED ITEM, Shots for macular degeneration left eye .  rosuvastatin (CRESTOR) 20 MG tablet, Take 20 mg by mouth daily., Disp: , Rfl:   No Known Allergies   Review of Systems   Pertinent items are noted in the HPI. Otherwise, ROS is negative.  Vitals:   Vitals:   05/24/18 0913  BP: 120/82  Pulse: 80  Temp: 98.2 F (36.8 C)  TempSrc: Oral  SpO2: 95%  Weight: 163 lb (73.9 kg)  Height: 5' 7.5" (1.715 m)  Body mass index is 25.15 kg/m.  Physical Exam:   Physical Exam  Constitutional: He is oriented to person, place, and time. He appears well-developed and well-nourished. No distress.  HENT:  Head: Normocephalic and atraumatic.  Right Ear: External ear normal.  Left Ear: External ear normal.  Nose: Nose normal.  Mouth/Throat: Oropharynx is clear and moist.  Eyes: Pupils are equal, round, and reactive to light. Conjunctivae and EOM are normal.  Neck: Normal range of motion. Neck supple.  Cardiovascular: Normal rate, regular rhythm, normal heart sounds and intact distal pulses.  Pulmonary/Chest: Effort normal and breath sounds normal.  Abdominal: Soft. Bowel sounds are  normal.  Musculoskeletal: Normal range of motion.  Neurological: He is alert and oriented to person, place, and time.  Skin: Skin is warm and dry.  Psychiatric: He has a normal mood and affect. His behavior is normal. Judgment and thought content normal.  Nursing note and vitals reviewed.  Assessment and Plan:   Anxiety attack Doing well. No side effects. Continue to use Ativan sparingly.   Depression, major, single episode, in partial remission ([place]) Symptoms have been nearly resolved. Patient denies depressed mood, difficulty concentrating, feelings of worthlessness/guilt, hopelessness, hypersomnia, impaired memory and recurrent thoughts of death.  Assessment: Depression, rapidly improving.    Plan: 1. Medications: continue Lexapro. 2. Instructed patient to contact office or on-call physician promptly should condition worsen or any new symptoms appear. IF THE PATIENT HAS ANY SUICIDAL OR HOMICIDAL IDEATIONS, CALL THE OFFICE, DISCUSS WITH A SUPPORT MEMBER, OR GO TO THE ER IMMEDIATELY. Patient was agreeable with this plan.  Subclinical hypothyroidism Current symptoms: none. Patient denies diarrhea, heat / cold intolerance, nervousness, palpitations and weight changes. Plan: Recheck thyroid function tests.  Orders Placed This Encounter  Procedures  . Comprehensive metabolic panel  . TSH  . T4, free   Meds ordered this encounter  Medications  . escitalopram (LEXAPRO) 10 MG tablet    Sig: Take 1 tablet (10 mg total) by mouth daily.    Dispense:  90 tablet    Refill:  1   . Reviewed expectations re: course of current medical issues. . Discussed self-management of symptoms. . Outlined signs and symptoms indicating need for more acute intervention. . Patient verbalized understanding and all questions were answered. Marland Kitchen Health Maintenance issues including appropriate healthy diet, exercise, and smoking avoidance were discussed with patient. . See orders for this visit as documented in  the electronic medical record. . Patient received an After Visit Summary.  Briscoe Deutscher, DO [place], [place] 05/25/2018  CMA served as Education administrator during this visit. History, Physical, and Plan performed by medical provider. The above documentation has been reviewed and is accurate and complete. Briscoe Deutscher, D.O.

## 2018-05-24 ENCOUNTER — Ambulatory Visit: Payer: BLUE CROSS/BLUE SHIELD | Admitting: Family Medicine

## 2018-05-24 ENCOUNTER — Encounter: Payer: Self-pay | Admitting: Family Medicine

## 2018-05-24 VITALS — BP 120/82 | HR 80 | Temp 98.2°F | Ht 67.5 in | Wt 163.0 lb

## 2018-05-24 DIAGNOSIS — E039 Hypothyroidism, unspecified: Secondary | ICD-10-CM | POA: Diagnosis not present

## 2018-05-24 DIAGNOSIS — F410 Panic disorder [episodic paroxysmal anxiety] without agoraphobia: Secondary | ICD-10-CM

## 2018-05-24 DIAGNOSIS — F321 Major depressive disorder, single episode, moderate: Secondary | ICD-10-CM

## 2018-05-24 DIAGNOSIS — F324 Major depressive disorder, single episode, in partial remission: Secondary | ICD-10-CM | POA: Diagnosis not present

## 2018-05-24 DIAGNOSIS — E038 Other specified hypothyroidism: Secondary | ICD-10-CM

## 2018-05-24 LAB — T4, FREE: Free T4: 0.75 ng/dL (ref 0.60–1.60)

## 2018-05-24 LAB — COMPREHENSIVE METABOLIC PANEL
ALT: 51 U/L (ref 0–53)
AST: 31 U/L (ref 0–37)
Albumin: 4.7 g/dL (ref 3.5–5.2)
Alkaline Phosphatase: 47 U/L (ref 39–117)
BUN: 16 mg/dL (ref 6–23)
CO2: 27 mEq/L (ref 19–32)
Calcium: 9.5 mg/dL (ref 8.4–10.5)
Chloride: 103 mEq/L (ref 96–112)
Creatinine, Ser: 1.03 mg/dL (ref 0.40–1.50)
GFR: 80.14 mL/min (ref >60.00)
Glucose, Bld: 108 mg/dL — ABNORMAL HIGH (ref 70–99)
Potassium: 4.8 mEq/L (ref 3.5–5.1)
Sodium: 137 mEq/L (ref 135–145)
Total Bilirubin: 1.1 mg/dL (ref 0.2–1.2)
Total Protein: 7.1 g/dL (ref 6.0–8.3)

## 2018-05-24 LAB — TSH: TSH: 4.58 u[IU]/mL — ABNORMAL HIGH (ref 0.35–4.50)

## 2018-05-24 MED ORDER — ESCITALOPRAM OXALATE 10 MG PO TABS: 10.0000 mg | ORAL_TABLET | Freq: Every day | ORAL | 1 refills | Status: DC

## 2018-05-25 DIAGNOSIS — F339 Major depressive disorder, recurrent, unspecified: Secondary | ICD-10-CM | POA: Insufficient documentation

## 2018-05-25 DIAGNOSIS — F324 Major depressive disorder, single episode, in partial remission: Secondary | ICD-10-CM | POA: Insufficient documentation

## 2018-05-25 DIAGNOSIS — F410 Panic disorder [episodic paroxysmal anxiety] without agoraphobia: Secondary | ICD-10-CM | POA: Insufficient documentation

## 2018-05-25 DIAGNOSIS — F411 Generalized anxiety disorder: Secondary | ICD-10-CM | POA: Insufficient documentation

## 2018-05-25 NOTE — Assessment & Plan Note (Signed)
Doing well. No side effects. Continue to use Ativan sparingly.

## 2018-05-25 NOTE — Assessment & Plan Note (Signed)
Symptoms have been nearly resolved. Patient denies depressed mood, difficulty concentrating, feelings of worthlessness/guilt, hopelessness, hypersomnia, impaired memory and recurrent thoughts of death.  Assessment: Depression, rapidly improving.    Plan: 1. Medications: continue Lexapro. 2. Instructed patient to contact office or on-call physician promptly should condition worsen or any new symptoms appear. IF THE PATIENT HAS ANY SUICIDAL OR HOMICIDAL IDEATIONS, CALL THE OFFICE, DISCUSS WITH A SUPPORT MEMBER, OR GO TO THE ER IMMEDIATELY. Patient was agreeable with this plan.

## 2018-05-25 NOTE — Assessment & Plan Note (Signed)
Current symptoms: none. Patient denies diarrhea, heat / cold intolerance, nervousness, palpitations and weight changes. Plan: Recheck thyroid function tests.

## 2018-05-26 ENCOUNTER — Encounter (INDEPENDENT_AMBULATORY_CARE_PROVIDER_SITE_OTHER): Payer: BLUE CROSS/BLUE SHIELD | Admitting: Ophthalmology

## 2018-06-02 ENCOUNTER — Encounter (INDEPENDENT_AMBULATORY_CARE_PROVIDER_SITE_OTHER): Payer: BLUE CROSS/BLUE SHIELD | Admitting: Ophthalmology

## 2018-06-02 DIAGNOSIS — H353111 Nonexudative age-related macular degeneration, right eye, early dry stage: Secondary | ICD-10-CM

## 2018-06-02 DIAGNOSIS — H43813 Vitreous degeneration, bilateral: Secondary | ICD-10-CM

## 2018-06-02 DIAGNOSIS — H35033 Hypertensive retinopathy, bilateral: Secondary | ICD-10-CM | POA: Diagnosis not present

## 2018-06-02 DIAGNOSIS — H2512 Age-related nuclear cataract, left eye: Secondary | ICD-10-CM

## 2018-06-02 DIAGNOSIS — I10 Essential (primary) hypertension: Secondary | ICD-10-CM | POA: Diagnosis not present

## 2018-06-02 DIAGNOSIS — B399 Histoplasmosis, unspecified: Secondary | ICD-10-CM

## 2018-06-02 DIAGNOSIS — H353221 Exudative age-related macular degeneration, left eye, with active choroidal neovascularization: Secondary | ICD-10-CM | POA: Diagnosis not present

## 2018-06-02 DIAGNOSIS — H32 Chorioretinal disorders in diseases classified elsewhere: Secondary | ICD-10-CM

## 2018-06-27 ENCOUNTER — Encounter

## 2018-06-27 ENCOUNTER — Other Ambulatory Visit: Payer: Self-pay | Admitting: *Deleted

## 2018-06-27 ENCOUNTER — Ambulatory Visit (INDEPENDENT_AMBULATORY_CARE_PROVIDER_SITE_OTHER): Payer: BLUE CROSS/BLUE SHIELD | Admitting: Psychiatry

## 2018-06-27 DIAGNOSIS — F331 Major depressive disorder, recurrent, moderate: Secondary | ICD-10-CM | POA: Diagnosis not present

## 2018-06-27 DIAGNOSIS — E785 Hyperlipidemia, unspecified: Secondary | ICD-10-CM

## 2018-06-27 MED ORDER — ROSUVASTATIN CALCIUM 20 MG PO TABS: 20.0000 mg | ORAL_TABLET | Freq: Every day | ORAL | 3 refills | Status: DC

## 2018-06-27 NOTE — Progress Notes (Signed)
      [facility] Counselor/Therapist Progress Note  Patient ID: Shane [name], MRN: [id number],    Date: 06/27/2018  Time Spent: 60 minutes  Treatment Type: Individual Therapy  Reported Symptoms: anger, stressed, anxious  Mental Status Exam:  Appearance:   Casual     Behavior:  Appropriate and Sharing  Motor:  Normal  Speech/Language:   Normal Rate  Affect:  Congruent  Mood:  anxious  Thought process:  normal  Thought content:    WNL  Sensory/Perceptual disturbances:    WNL  Orientation:  oriented to person, place, time/date, situation, day of week, month of year and year  Attention:  Good  Concentration:  Good  Memory:  WNL  Fund of knowledge:   Good  Insight:    Good  Judgment:   Good  Impulse Control:  Good   Risk Assessment: Danger to Self:  No Self-injurious Behavior: No Danger to Others: No Duty to Warn:no Physical Aggression / Violence:No  Access to Firearms a concern: No  Gang Involvement:No   Subjective:  Patient reports wife's issues remain about the same and they seem to be good at working together on that.  Today, he discussed at length his continued anger / resentment and needs to let go.  Talked about strategies to help him in letting go, including taking first step to make that choice.  Responds well to this and seems more motivated to take responsibility.  Understands how the unresolved anger is very much affecting his current living situation and relationships.  Wants more peace and seems to understand better his role in all of this and that he needs to make a different choice in order to let go and move forward.  Interventions: Solution-Oriented/Positive Psychology and Ego-Supportive  Diagnosis:   ICD-10-CM   1. Major depressive disorder, recurrent episode, moderate ([place]) F33.1     Plan: Patient to follow up on longer term anger issues, realizing he needs to make a choice to let go---will work towards that, as discussed in session and noted  above.  Will discuss further next session to note progress and any difficulties in making necessary changes.  Shane Ace, LCSW

## 2018-07-14 ENCOUNTER — Encounter (INDEPENDENT_AMBULATORY_CARE_PROVIDER_SITE_OTHER): Payer: BLUE CROSS/BLUE SHIELD | Admitting: Ophthalmology

## 2018-07-14 DIAGNOSIS — H2513 Age-related nuclear cataract, bilateral: Secondary | ICD-10-CM

## 2018-07-14 DIAGNOSIS — B399 Histoplasmosis, unspecified: Secondary | ICD-10-CM

## 2018-07-14 DIAGNOSIS — H43813 Vitreous degeneration, bilateral: Secondary | ICD-10-CM

## 2018-07-14 DIAGNOSIS — H353111 Nonexudative age-related macular degeneration, right eye, early dry stage: Secondary | ICD-10-CM

## 2018-07-14 DIAGNOSIS — H35033 Hypertensive retinopathy, bilateral: Secondary | ICD-10-CM | POA: Diagnosis not present

## 2018-07-14 DIAGNOSIS — I10 Essential (primary) hypertension: Secondary | ICD-10-CM | POA: Diagnosis not present

## 2018-07-14 DIAGNOSIS — H353221 Exudative age-related macular degeneration, left eye, with active choroidal neovascularization: Secondary | ICD-10-CM | POA: Diagnosis not present

## 2018-07-26 ENCOUNTER — Encounter: Payer: Self-pay | Admitting: Psychiatry

## 2018-07-26 ENCOUNTER — Ambulatory Visit (INDEPENDENT_AMBULATORY_CARE_PROVIDER_SITE_OTHER): Payer: BLUE CROSS/BLUE SHIELD | Admitting: Psychiatry

## 2018-07-26 DIAGNOSIS — F331 Major depressive disorder, recurrent, moderate: Secondary | ICD-10-CM | POA: Diagnosis not present

## 2018-07-26 NOTE — Progress Notes (Signed)
      [facility] Counselor/Therapist Progress Note  Patient ID: Shane [name], MRN: [id number],    Date: 07/26/2018  Time Spent: 58 minutes  Treatment Type: Individual Therapy  Reported Symptoms:  Stressors in dealing with my wife with bipolar; anxiety  Mental Status Exam:  Appearance:   Casual     Behavior:  Appropriate and Sharing  Motor:  Normal  Speech/Language:   Normal Rate  Affect:  Congruent  Mood:  anxious  Thought process:  normal  Thought content:    WNL  Sensory/Perceptual disturbances:    WNL  Orientation:  oriented to person, place, time/date, situation, day of week, month of year and year  Attention:  Good  Concentration:  Good  Memory:  WNL  Fund of knowledge:   Good  Insight:    Good  Judgment:   Good  Impulse Control:  Good   Risk Assessment: Danger to Self:  No Self-injurious Behavior: No Danger to Others: No Duty to Warn:no Physical Aggression / Violence:No  Access to Firearms a concern: No  Gang Involvement:No   Subjective:  Patient reports making progress in managing ex-wife issues and has been able to handle things with less "angst" and conflict.  Also reports doing better with his wife and her bipolar issues.  A big focus area for them is improving their communications, especially in trying to listen better.  Trying to recognize the disconnects in their listening. Also how tone is so important in verbal communication--trying to recognize that how we say things carries as much weight as what we say.  Discussed strategies to help accomplish these communication goals.  Feeling encouraged at their progress and motivated to continue.    Interventions: Solution-Oriented/Positive Psychology and Ego-Supportive  Diagnosis:   ICD-10-CM   1. Major depressive disorder, recurrent episode, moderate ([place]) F33.1     Plan:   Patient to follow through in working with wife further on their communication skills to change unhealthy patterns and to really hear  and work to understand each other. To return in about 3 wks.     [name], LCSW

## 2018-08-16 ENCOUNTER — Ambulatory Visit (INDEPENDENT_AMBULATORY_CARE_PROVIDER_SITE_OTHER): Payer: BLUE CROSS/BLUE SHIELD | Admitting: Psychiatry

## 2018-08-16 DIAGNOSIS — F331 Major depressive disorder, recurrent, moderate: Secondary | ICD-10-CM

## 2018-08-16 NOTE — Progress Notes (Signed)
      [facility] Counselor/Therapist Progress Note  Patient ID: Shane [name], MRN: [id number],    Date: 08/16/2018  Time Spent: 58 minutes  Treatment Type: Individual Therapy  Reported Symptoms:  Anger,frustration, anxiety  Mental Status Exam:  Appearance:   Casual     Behavior:  Appropriate and Sharing  Motor:  Normal  Speech/Language:   Normal Rate  Affect:  Congruent  Mood:  anxious  Thought process:  normal  Thought content:    WNL  Sensory/Perceptual disturbances:    WNL  Orientation:  oriented to person, place, time/date, situation, day of week, month of year and year  Attention:  Good  Concentration:  Good  Memory:  WNL  Fund of knowledge:   Good  Insight:    Good  Judgment:   Good  Impulse Control:  Good   Risk Assessment: Danger to Self:  No Self-injurious Behavior: No Danger to Others: No Duty to Warn:no Physical Aggression / Violence:No  Access to Firearms a concern: No  Gang Involvement:No   Subjective:  Patient today reports making progress in managing ex-wife issues and has been able to handle things with less conflict.  Also reports doing better with his wife and her bipolar issues.  Communication issues continue to be a focal point, especially in trying to listen better.  Trying to recognize the disconnects more quickly in their listening.  Review how tone is so important in verbal communication--trying to recognize that how we say things carries as much weight as what we say.  Discussed strategies to help maintain progress made in communication goals.  Patient feeling encouraged at his progress and motivated to continue.  Did discuss some recent work stressors before leaving "just to vent", but was able to see some communication changes that needs to occur in his work environment as well and plans to follow through on that.  Interventions: Solution-Oriented/Positive Psychology and Ego-Supportive  Diagnosis:   ICD-10-CM   1. Major depressive  disorder, recurrent episode, moderate ([place]) F33.1     Plan:   Patient to follow through in working with wife , and in his job environment,further his communication skills to change unhealthy patterns and to really hear and work to understand others. To return in about 3 wks.     [name], LCSW

## 2018-08-21 ENCOUNTER — Telehealth: Payer: Self-pay

## 2018-08-23 NOTE — Telephone Encounter (Signed)
Error

## 2018-08-25 ENCOUNTER — Encounter (INDEPENDENT_AMBULATORY_CARE_PROVIDER_SITE_OTHER): Payer: BLUE CROSS/BLUE SHIELD | Admitting: Ophthalmology

## 2018-08-25 DIAGNOSIS — H35033 Hypertensive retinopathy, bilateral: Secondary | ICD-10-CM | POA: Diagnosis not present

## 2018-08-25 DIAGNOSIS — H353111 Nonexudative age-related macular degeneration, right eye, early dry stage: Secondary | ICD-10-CM | POA: Diagnosis not present

## 2018-08-25 DIAGNOSIS — H353221 Exudative age-related macular degeneration, left eye, with active choroidal neovascularization: Secondary | ICD-10-CM

## 2018-08-25 DIAGNOSIS — I10 Essential (primary) hypertension: Secondary | ICD-10-CM | POA: Diagnosis not present

## 2018-08-25 DIAGNOSIS — B399 Histoplasmosis, unspecified: Secondary | ICD-10-CM

## 2018-08-25 DIAGNOSIS — H43813 Vitreous degeneration, bilateral: Secondary | ICD-10-CM

## 2018-09-05 ENCOUNTER — Encounter: Payer: Self-pay | Admitting: Family Medicine

## 2018-09-05 ENCOUNTER — Ambulatory Visit: Payer: BLUE CROSS/BLUE SHIELD | Admitting: Family Medicine

## 2018-09-05 VITALS — BP 124/82 | HR 77 | Temp 97.4°F | Ht 68.0 in | Wt 173.0 lb

## 2018-09-05 DIAGNOSIS — E039 Hypothyroidism, unspecified: Secondary | ICD-10-CM

## 2018-09-05 DIAGNOSIS — Z951 Presence of aortocoronary bypass graft: Secondary | ICD-10-CM

## 2018-09-05 DIAGNOSIS — I25700 Atherosclerosis of coronary artery bypass graft(s), unspecified, with unstable angina pectoris: Secondary | ICD-10-CM

## 2018-09-05 DIAGNOSIS — G4730 Sleep apnea, unspecified: Secondary | ICD-10-CM

## 2018-09-05 DIAGNOSIS — E038 Other specified hypothyroidism: Secondary | ICD-10-CM

## 2018-09-05 DIAGNOSIS — R0683 Snoring: Secondary | ICD-10-CM | POA: Diagnosis not present

## 2018-09-05 DIAGNOSIS — F324 Major depressive disorder, single episode, in partial remission: Secondary | ICD-10-CM

## 2018-09-05 DIAGNOSIS — E8881 Metabolic syndrome: Secondary | ICD-10-CM | POA: Diagnosis not present

## 2018-09-05 LAB — CBC WITH DIFFERENTIAL/PLATELET
Basophils Absolute: 0 10*3/uL (ref 0.0–0.1)
Basophils Relative: 0.6 % (ref 0.0–3.0)
Eosinophils Absolute: 0.1 10*3/uL (ref 0.0–0.7)
Eosinophils Relative: 2 % (ref 0.0–5.0)
HCT: 41.4 % (ref 39.0–52.0)
Hemoglobin: 14.3 g/dL (ref 13.0–17.0)
Lymphocytes Relative: 23.3 % (ref 12.0–46.0)
Lymphs Abs: 1.5 10*3/uL (ref 0.7–4.0)
MCHC: 34.6 g/dL (ref 30.0–36.0)
MCV: 94.8 fl (ref 78.0–100.0)
Monocytes Absolute: 0.6 10*3/uL (ref 0.1–1.0)
Monocytes Relative: 8.6 % (ref 3.0–12.0)
Neutro Abs: 4.2 10*3/uL (ref 1.4–7.7)
Neutrophils Relative %: 65.5 % (ref 43.0–77.0)
Platelets: 252 10*3/uL (ref 150.0–400.0)
RBC: 4.37 Mil/uL (ref 4.22–5.81)
RDW: 12.9 % (ref 11.5–15.5)
WBC: 6.4 10*3/uL (ref 4.0–10.5)

## 2018-09-05 LAB — LIPID PANEL
Cholesterol: 146 mg/dL (ref 0–200)
HDL: 55.7 mg/dL (ref >39.00)
LDL Cholesterol: 61 mg/dL (ref 0–99)
NonHDL: 89.81
Total CHOL/HDL Ratio: 3
Triglycerides: 146 mg/dL (ref 0.0–149.0)
VLDL: 29.2 mg/dL (ref 0.0–40.0)

## 2018-09-05 LAB — COMPREHENSIVE METABOLIC PANEL
ALT: 82 U/L — ABNORMAL HIGH (ref 0–53)
AST: 40 U/L — ABNORMAL HIGH (ref 0–37)
Albumin: 4.6 g/dL (ref 3.5–5.2)
Alkaline Phosphatase: 64 U/L (ref 39–117)
BUN: 19 mg/dL (ref 6–23)
CO2: 25 mEq/L (ref 19–32)
Calcium: 9.5 mg/dL (ref 8.4–10.5)
Chloride: 103 mEq/L (ref 96–112)
Creatinine, Ser: 0.96 mg/dL (ref 0.40–1.50)
GFR: 81.7 mL/min (ref >60.00)
Glucose, Bld: 97 mg/dL (ref 70–99)
Potassium: 4.6 mEq/L (ref 3.5–5.1)
Sodium: 136 mEq/L (ref 135–145)
Total Bilirubin: 0.9 mg/dL (ref 0.2–1.2)
Total Protein: 7 g/dL (ref 6.0–8.3)

## 2018-09-05 LAB — T4, FREE: Free T4: 0.75 ng/dL (ref 0.60–1.60)

## 2018-09-05 LAB — TSH: TSH: 4.47 u[IU]/mL (ref 0.35–4.50)

## 2018-09-05 LAB — HEMOGLOBIN A1C: Hgb A1c MFr Bld: 5.7 % (ref 4.6–6.5)

## 2018-09-05 MED ORDER — METFORMIN HCL ER 500 MG PO TB24: 500.0000 mg | ORAL_TABLET | Freq: Every day | ORAL | 0 refills | Status: DC

## 2018-09-05 MED ORDER — METFORMIN HCL 500 MG PO TABS: 500.0000 mg | ORAL_TABLET | Freq: Every day | ORAL | 1 refills | Status: DC

## 2018-09-05 MED ORDER — METFORMIN HCL ER 500 MG PO TB24: 500.0000 mg | ORAL_TABLET | Freq: Every day | ORAL | 3 refills | Status: DC

## 2018-09-05 NOTE — Progress Notes (Signed)
Shane [name] is a 53 y.o. male is here for follow up.  History of Present Illness:   HPI: See Assessment and Plan section for Problem Based Charting of issues discussed today.   There are no preventive care reminders to display for this patient. Depression screen [facility] 2/9 09/05/2018 05/24/2018 04/03/2018  Decreased Interest 0 0 1  Down, Depressed, Hopeless 0 0 3  PHQ - 2 Score 0 0 4  Altered sleeping 1 0 0  Tired, decreased energy 0 0 1  Change in appetite 0 1 1  Feeling bad or failure about yourself  0 0 0  Trouble concentrating 0 0 0  Moving slowly or fidgety/restless 1 0 0  Suicidal thoughts 0 0 0  PHQ-9 Score 2 1 6   Difficult doing work/chores Not difficult at all Not difficult at all Extremely dIfficult   PMHx, SurgHx, SocialHx, FamHx, Medications, and Allergies were reviewed in the Visit Navigator and updated as appropriate.   Patient Active Problem List   Diagnosis Date Noted  . Sleep-disordered breathing 09/07/2018  . Anxiety attack 05/25/2018  . Depression, major, single episode, in partial remission ([place]) 05/25/2018  . S/P CABG (coronary artery bypass graft) 11/16/2017  . Insulin resistance 11/16/2017  . Subclinical hypothyroidism 11/16/2017  . Macular degeneration   . PVC's (premature ventricular contractions) 09/11/2014  . CAD (coronary artery disease) of artery bypass graft 05/24/2014  . Hyperlipidemia 05/24/2014  . Hodgkin disease ([place]) 07/13/1995   Social History   Tobacco Use  . Smoking status: Never Smoker  . Smokeless tobacco: Former Systems developer  . Tobacco comment: a long time ago  Substance Use Topics  . Alcohol use: Yes    Alcohol/week: 3.0 standard drinks    Types: 3 Cans of beer per week    Comment: sometimes 3 beers daily  . Drug use: No   Current Medications and Allergies:   Current Outpatient Medications:  .  aspirin 81 MG tablet, Take 81 mg by mouth daily., Disp: , Rfl:  .  BESIVANCE 0.6 % SUSP, as needed. , Disp: , Rfl: 1 .  carvedilol  (COREG) 6.25 MG tablet, Take 1 tablet by mouth twice daily., Disp: 180 tablet, Rfl: 1 .  escitalopram (LEXAPRO) 10 MG tablet, Take 1 tablet (10 mg total) by mouth daily., Disp: 90 tablet, Rfl: 1 .  ibuprofen (ADVIL,MOTRIN) 200 MG tablet, Take 200 mg by mouth every 6 (six) hours as needed., Disp: , Rfl:  .  LORazepam (ATIVAN) 0.5 MG tablet, Take 1 tablet (0.5 mg total) by mouth 2 (two) times daily as needed for anxiety., Disp: 15 tablet, Rfl: 1 .  nitroGLYCERIN (NITROSTAT) 0.4 MG SL tablet, Place 1 tablet (0.4 mg total) under the tongue every 5 (five) minutes as needed for chest pain., Disp: 25 tablet, Rfl: 6 .  NONFORMULARY OR COMPOUNDED ITEM, Shots for macular degeneration left eye, Disp: , Rfl:  .  rosuvastatin (CRESTOR) 20 MG tablet, Take 1 tablet (20 mg total) by mouth daily., Disp: 90 tablet, Rfl: 3 .  metFORMIN (GLUCOPHAGE-XR) 500 MG 24 hr tablet, Take 1 tablet (500 mg total) by mouth at bedtime., Disp: 30 tablet, Rfl: 0  No Known Allergies   Review of Systems   Pertinent items are noted in the HPI. Otherwise, ROS is negative.  Vitals:   Vitals:   09/05/18 1039  BP: 124/82  Pulse: 77  Temp: (!) 97.4 F (36.3 C)  TempSrc: Oral  SpO2: 96%  Weight: 173 lb (78.5 kg)  Height: 5'  8" (1.727 m)     Body mass index is 26.3 kg/m.  Physical Exam:   Physical Exam Vitals signs and nursing note reviewed.  Constitutional:      General: He is not in acute distress.    Appearance: He is well-developed.  HENT:     Head: Normocephalic and atraumatic.     Right Ear: External ear normal.     Left Ear: External ear normal.     Nose: Nose normal.  Eyes:     Conjunctiva/sclera: Conjunctivae normal.     Pupils: Pupils are equal, round, and reactive to light.  Neck:     Musculoskeletal: Neck supple.  Cardiovascular:     Rate and Rhythm: Normal rate and regular rhythm.  Pulmonary:     Effort: Pulmonary effort is normal.  Abdominal:     General: Bowel sounds are normal.      Palpations: Abdomen is soft.  Musculoskeletal: Normal range of motion.  Skin:    General: Skin is warm.  Neurological:     Mental Status: He is alert.  Psychiatric:        Behavior: Behavior normal.     Assessment and Plan:   Subclinical hypothyroidism Appears euthyroid currently.  We will continue to monitor.  S/P CABG (coronary artery bypass graft) Denies chest pain, shortness of breath, edema.  Compliant with medications without side effects.  Not exercising regularly but is hoping to start soon.  Continue current treatment.  Insulin resistance Working on improving diet.  Will start exercise.  Depression, major, single episode, in partial remission ([place]) Waxing and waning.  Patient has a high stress job and was frustrated a few weeks ago due to the job, but has been able to work things out and is feeling better.  His wife has mental illness and paranoia that he is monitoring her.  Patient is willing to work with the marriage counselor to see how this can be better.  He continues to take his medication without any side effects.  He does feel that it is helping.  Sleep-disordered breathing Wife states that he snores loudly and has apneic episodes.  He is okay with referral to sleep study.  Orders Placed This Encounter  Procedures  . CBC with Differential/Platelet  . Hemoglobin A1c  . Comprehensive metabolic panel  . Lipid panel  . T4, free  . TSH  . Ambulatory referral to Sleep Studies   Meds ordered this encounter  Medications  . DISCONTD: metFORMIN (GLUCOPHAGE) 500 MG tablet    Sig: Take 1 tablet (500 mg total) by mouth daily with breakfast.    Dispense:  90 tablet    Refill:  1  . DISCONTD: metFORMIN (GLUCOPHAGE-XR) 500 MG 24 hr tablet    Sig: Take 1 tablet (500 mg total) by mouth at bedtime.    Dispense:  90 tablet    Refill:  3  . metFORMIN (GLUCOPHAGE-XR) 500 MG 24 hr tablet    Sig: Take 1 tablet (500 mg total) by mouth at bedtime.    Dispense:  30 tablet     Refill:  0  . metFORMIN (GLUCOPHAGE XR) 750 MG 24 hr tablet    Sig: Take 1 tablet (750 mg total) by mouth daily with breakfast.    Dispense:  90 tablet    Refill:  3    . Reviewed expectations re: course of current medical issues. . Discussed self-management of symptoms. . Outlined signs and symptoms indicating need for more acute intervention. . Patient  verbalized understanding and all questions were answered. Marland Kitchen Health Maintenance issues including appropriate healthy diet, exercise, and smoking avoidance were discussed with patient. . See orders for this visit as documented in the electronic medical record. . Patient received an After Visit Summary.  Briscoe Deutscher, DO [place] 09/07/2018

## 2018-09-06 MED ORDER — METFORMIN HCL ER 750 MG PO TB24: 750.0000 mg | ORAL_TABLET | Freq: Every day | ORAL | 3 refills | Status: DC

## 2018-09-07 ENCOUNTER — Ambulatory Visit: Payer: BLUE CROSS/BLUE SHIELD | Admitting: Psychiatry

## 2018-09-07 DIAGNOSIS — F331 Major depressive disorder, recurrent, moderate: Secondary | ICD-10-CM

## 2018-09-07 DIAGNOSIS — G4730 Sleep apnea, unspecified: Secondary | ICD-10-CM | POA: Insufficient documentation

## 2018-09-07 DIAGNOSIS — G4733 Obstructive sleep apnea (adult) (pediatric): Secondary | ICD-10-CM | POA: Insufficient documentation

## 2018-09-07 NOTE — Assessment & Plan Note (Signed)
Working on improving diet.  Will start exercise.

## 2018-09-07 NOTE — Assessment & Plan Note (Signed)
Denies chest pain, shortness of breath, edema.  Compliant with medications without side effects.  Not exercising regularly but is hoping to start soon.  Continue current treatment.

## 2018-09-07 NOTE — Progress Notes (Signed)
      [facility] Counselor/Therapist Progress Note  Patient ID: Shane [name], MRN: [id number],    Date: 09/07/2018  Time Spent: 60 minutes  Treatment Type: Individual Therapy  Reported Symptoms:  Frustration, mild depressed mood at times, lowered energy, issues at work  Mental Status Exam:  Appearance:   Casual     Behavior:  Appropriate and Sharing  Motor:  Normal  Speech/Language:   Normal Rate  Affect:  Congruent  Mood:  anxious  Thought process:  normal  Thought content:    WNL  Sensory/Perceptual disturbances:    WNL  Orientation:  oriented to person, place, time/date, situation, day of week, month of year and year  Attention:  Good  Concentration:  Good  Memory:  WNL  Fund of knowledge:   Good  Insight:    Good  Judgment:   Good  Impulse Control:  Good   Risk Assessment: Danger to Self:  No Self-injurious Behavior: No Danger to Others: No Duty to Warn:no Physical Aggression / Violence:No  Access to Firearms a concern: No  Gang Involvement:No   Subjective:      Patient in today with milder depression and continued concerns re: issues that arise at work and at home with spouse (bipolar).  Communication issues in both situations as well as trust issue.  Talked about these at length and looked at strategies that may be helpful in approaching his work issues and difficulties at home.  The  complications from ex-wife seem to have stopped and that is feeling better for patient.  Communication issues continue to be a focal point, especially in trying to listen better.  Trying to recognize the disconnects more quickly in his listening as well as feeling at times he is not heard well by others.  Reviewed how tone is so important in verbal communication--trying to recognize that how we say things can carry  as much weight as what we say.  Reviewed strategies to help maintain progress made in communication goals.  Patient feeling encouraged.   Interventions:  Solution-Oriented/Positive Psychology and Ego-Supportive  Diagnosis:   ICD-10-CM   1. Major depressive disorder, recurrent episode, moderate ([place]) F33.1     Plan:   Patient to follow through in working with wife , and in his job environment,further his communication skills to change unhealthy patterns and to really hear and work to understand others.  Review of coping skills. To return in about 3 wks.     [name], LCSW

## 2018-09-07 NOTE — Assessment & Plan Note (Signed)
Waxing and waning.  Patient has a high stress job and was frustrated a few weeks ago due to the job, but has been able to work things out and is feeling better.  His wife has mental illness and paranoia that he is monitoring her.  Patient is willing to work with the marriage counselor to see how this can be better.  He continues to take his medication without any side effects.  He does feel that it is helping.

## 2018-09-07 NOTE — Assessment & Plan Note (Signed)
Appears euthyroid currently.  We will continue to monitor.

## 2018-09-07 NOTE — Assessment & Plan Note (Signed)
Wife states that he snores loudly and has apneic episodes.  He is okay with referral to sleep study.

## 2018-09-14 ENCOUNTER — Telehealth: Payer: Self-pay | Admitting: Family Medicine

## 2018-09-14 NOTE — Telephone Encounter (Signed)
Copied from [place] [id number]. Topic: General - Other >> Sep 14, 2018 12:27 PM Leward Quan A wrote: Reason for CRM: Lawerance Sabal with pill pack pharmacy called to clarify RX for metformin. Rx was sent to the Pharmacy on 09/05/2018(  metFORMIN (GLUCOPHAGE-XR) 500 MG 24 hr tablet ) and 09/06/2018 ( metFORMIN (GLUCOPHAGE XR) 750 MG 24 hr tablet ) Please clarify if patient will be taking both or just one and if so which one. Ph# [phone]

## 2018-09-15 ENCOUNTER — Other Ambulatory Visit: Payer: Self-pay

## 2018-09-15 NOTE — Telephone Encounter (Signed)
Notified [name] with pill pack to discontinue Metformin 500 MG,patient taking 750 MG at bedtime.

## 2018-09-28 ENCOUNTER — Ambulatory Visit (INDEPENDENT_AMBULATORY_CARE_PROVIDER_SITE_OTHER): Payer: BLUE CROSS/BLUE SHIELD | Admitting: Psychiatry

## 2018-09-28 ENCOUNTER — Other Ambulatory Visit: Payer: Self-pay

## 2018-09-28 DIAGNOSIS — F331 Major depressive disorder, recurrent, moderate: Secondary | ICD-10-CM | POA: Diagnosis not present

## 2018-09-28 NOTE — Progress Notes (Signed)
      [facility] Counselor/Therapist Progress Note  Patient ID: Shane [name], MRN: [id number],    Date: 09/28/2018  Time Spent: 60 minutes  Treatment Type: Individual Therapy  Reported Symptoms:  Anxiety, Frustration, mild depressed mood at times,  issues at work, dealing with                                         uncertainty  Mental Status Exam:  Appearance:   Casual     Behavior:  Appropriate and Sharing  Motor:  Normal  Speech/Language:   Normal Rate  Affect:  Anxious  Mood:  anxious  Thought process:  normal  Thought content:    WNL  Sensory/Perceptual disturbances:    WNL  Orientation:  oriented to person, place, time/date, situation, day of week, month of year and year  Attention:  Good  Concentration:  Good  Memory:  WNL  Fund of knowledge:   Good  Insight:    Good  Judgment:   Good  Impulse Control:  Good   Risk Assessment: Danger to Self:  No Self-injurious Behavior: No Danger to Others: No Duty to Warn:no Physical Aggression / Violence:No  Access to Firearms a concern: No  Gang Involvement:No   Subjective:    Patient in today with symptoms noted above.  Due to current coronavirus situation, he is working from home and "trying to cope as well as possible."   "Concerned with wife having bipolar diagnosis especially during this time" of the coronavirus situation.  Discussed strategies, especially CBT, to help patient with his own anxieties regarding the current health crisis in the world, his work-related issues which have decreased some with him working from home temporarily, and also strategies to help support his wife during this time of uncertainties.  With he and wife, communication issues involving trust, continue.   Trying to listen better and recognizing the disconnects more quickly in his listening as well as feeling at times he is not heard well by others.  Reviewed how tone is so important in verbal communication--trying to recognize that how we  say things can carry  as much weight as what we say.  Reviewed strategies to help maintain progress made in communication goals as well as other goals and areas of focus.  Patient feeling encouraged.   Interventions: Solution-Oriented/Positive Psychology and Ego-Supportive  Diagnosis:   ICD-10-CM   1. Major depressive disorder, recurrent episode, moderate ([place]) F33.1     Plan:   Patient to follow through on strategies noted above in working with wife , and in his job environment,further his communication skills to change unhealthy patterns and to really hear and work to understand others.  Review of coping skills. To return in about 3 wks.     [name], LCSW

## 2018-10-06 ENCOUNTER — Other Ambulatory Visit: Payer: Self-pay

## 2018-10-06 ENCOUNTER — Encounter (INDEPENDENT_AMBULATORY_CARE_PROVIDER_SITE_OTHER): Payer: BLUE CROSS/BLUE SHIELD | Admitting: Ophthalmology

## 2018-10-06 DIAGNOSIS — H353221 Exudative age-related macular degeneration, left eye, with active choroidal neovascularization: Secondary | ICD-10-CM

## 2018-10-06 DIAGNOSIS — H35033 Hypertensive retinopathy, bilateral: Secondary | ICD-10-CM | POA: Diagnosis not present

## 2018-10-06 DIAGNOSIS — I10 Essential (primary) hypertension: Secondary | ICD-10-CM

## 2018-10-06 DIAGNOSIS — H43813 Vitreous degeneration, bilateral: Secondary | ICD-10-CM

## 2018-10-06 DIAGNOSIS — B399 Histoplasmosis, unspecified: Secondary | ICD-10-CM

## 2018-10-06 DIAGNOSIS — H353111 Nonexudative age-related macular degeneration, right eye, early dry stage: Secondary | ICD-10-CM

## 2018-10-06 DIAGNOSIS — H2512 Age-related nuclear cataract, left eye: Secondary | ICD-10-CM

## 2018-10-06 DIAGNOSIS — H32 Chorioretinal disorders in diseases classified elsewhere: Secondary | ICD-10-CM

## 2018-10-09 ENCOUNTER — Institutional Professional Consult (permissible substitution): Payer: Self-pay | Admitting: Neurology

## 2018-10-17 ENCOUNTER — Telehealth: Payer: Self-pay | Admitting: Neurology

## 2018-10-17 NOTE — Telephone Encounter (Signed)
Due to current COVID 19 pandemic, our office is severely reducing in office visits for at least the next 2 weeks, in order to minimize the risk to our patients and healthcare providers. Our staff will contact you for next steps.  Pt understands that although there may be some limitations with this type of visit, we will take all precautions to reduce any security or privacy concerns.  Pt understands that this will be treated like an in office visit and we will file with pt's insurance, and there may be a patient responsible charge related to this service. Pt's email is [email or web address]. Pt understands that the [employer] webex software must be downloaded and operational on the device pt plans to use for the visit. Pt understands that the nurse will be calling to go over pt's chart.

## 2018-10-19 ENCOUNTER — Ambulatory Visit: Payer: BLUE CROSS/BLUE SHIELD | Admitting: Psychiatry

## 2018-11-01 ENCOUNTER — Other Ambulatory Visit: Payer: Self-pay | Admitting: Neurology

## 2018-11-01 ENCOUNTER — Encounter: Payer: Self-pay | Admitting: Neurology

## 2018-11-01 NOTE — Telephone Encounter (Signed)
Called the patient and there was no answer. LVM informing the patient was calling to make sure that he got his email for upcoming visit and able to download app. Also wanted to update the chart and make sure that was good. Advised I will mychart message in case he would rather reply that way.

## 2018-11-07 ENCOUNTER — Encounter: Payer: Self-pay | Admitting: Neurology

## 2018-11-07 ENCOUNTER — Other Ambulatory Visit: Payer: Self-pay

## 2018-11-07 ENCOUNTER — Ambulatory Visit (INDEPENDENT_AMBULATORY_CARE_PROVIDER_SITE_OTHER): Payer: BLUE CROSS/BLUE SHIELD | Admitting: Neurology

## 2018-11-07 DIAGNOSIS — G4730 Sleep apnea, unspecified: Secondary | ICD-10-CM

## 2018-11-07 DIAGNOSIS — E8881 Metabolic syndrome: Secondary | ICD-10-CM

## 2018-11-07 DIAGNOSIS — I25700 Atherosclerosis of coronary artery bypass graft(s), unspecified, with unstable angina pectoris: Secondary | ICD-10-CM | POA: Diagnosis not present

## 2018-11-07 DIAGNOSIS — I493 Ventricular premature depolarization: Secondary | ICD-10-CM | POA: Diagnosis not present

## 2018-11-07 DIAGNOSIS — E039 Hypothyroidism, unspecified: Secondary | ICD-10-CM

## 2018-11-07 DIAGNOSIS — E038 Other specified hypothyroidism: Secondary | ICD-10-CM

## 2018-11-07 NOTE — Progress Notes (Signed)
Virtual Visit via Video Note  I connected with Shane [name] on 11/07/18 at 11:00 AM EDT by a video enabled telemedicine application and verified that I am speaking with the correct person using two identifiers.   I discussed the limitations of evaluation and management by telemedicine and the availability of in person appointments. The patient expressed understanding and agreed to proceed.    SLEEP MEDICINE CLINIC   Provider:  Larey Seat, M D  Primary Care Physician:  Briscoe Deutscher, DO   Referring Provider: Briscoe Deutscher, DO     HPI:  Shane [name] is a 53 y.o. male , seen here as in a referral from Dr. Juleen [name] for a sleep apnea evaluation.   Chief complaint according to patient : Shane [name] felt states that his wife complains about his loud snoring gasping, and choking have been concerning.  She also suffers from insomnia and feels that this contributes to her inability to find restful sleep.  The patient himself is under the impression that he sleeps rather calmly and has no problems with sleep.    Sleep/ medical history : His divorce failed has a history of anxiety attacks, a remote history of major depression but recently has not scored high on his PHQ 9's.  Insulin resistance hyperglycemia, hypothyroidism, macular degeneration the right forearm affecting the right eye for which she is treated at the retina center of his injections.  Premature ventricular contractions, coronary artery disease with an artery bypass at a very young age.  He is also a survivor of Hodgkin's lymphoma diagnosed in 1997 and he does have problems with hyperlipidemia which is medically controlled.  I reviewed with parents patient's medications and his last physical exam from 05 September 2018 with his primary care physician.  At the time he had a BMI of 26.3 and he reports that his weight has not changed.  Family sleep history: The patient's mother had COPD was a longtime heavy smoker,  suffered a stroke and also counselor is listed as her medical history.  Both patients  were heavy smokers, his father also developed COPD, had heart disease and had a heart valve replacement at age 63.  The patient did not endorse obstructive sleep apnea to be diagnosed in any family member, no sleep problem.   Social history: Shane [name] spelled works in Librarian, academic and he has a regular workday.  He smoked for a brief time while in college over 30 years ago he drinks 3 beers or less per week, he drinks 1 caffeinated coffee in the morning otherwise drinks soda but without caffeine.  He is married for 5 years to a Network engineer.  The couple has cats that do sleep in the same bedroom.  Sleep habits are as follows: Dinnertime for the couple is around 7 PM bedtime is between 1030 and 11 PM and the patient is usually prompt sleep which raises the N/V of his wife.  His preferred sleep position is supine and he uses one contour pillow, sleeping on a flat bed.  Around 330 to 4 AM he may have a bathroom break but not every night.  He does not recall dreaming and he usually sleeps again until 6:30 AM when his cats wake him.  He does not feel however fully restored and refreshed and he feels often as if he could use another hour of sleep.  Rarely has the an opportunity to nap in daytime but if so it is usually on the weekends.  These  naps may take 45 to 60 minutes.  His wife has stated that his loud snoring and irregular breathing is of great concern to her.  I would like to add that the couple usually goes to bed with the TV on in the background but it is set on a timer for about 30 minutes after that.  The bedroom is cool, quiet and dark.  The patient is asleep his wife is awake for many hours.     Review of Systems: Out of a complete 14 system review, the patient complains of only the following symptoms, and all other reviewed systems are negative.   Epworth score: The patient did not have his  Epworth Sleepiness Scale ready he stated that it was about 2 out of 24 points.  He discussed that he has no restless leg syndrome only snoring and apnea as reported by his wife.,   Social History   Socioeconomic History  . Marital status: Married    Spouse name: Not on file  . Number of children: Not on file  . Years of education: Not on file  . Highest education level: Not on file  Occupational History  . Not on file  Social Needs  . Financial resource strain: Not on file  . Food insecurity:    Worry: Not on file    Inability: Not on file  . Transportation needs:    Medical: Not on file    Non-medical: Not on file  Tobacco Use  . Smoking status: Never Smoker  . Smokeless tobacco: Former Systems developer  . Tobacco comment: a long time ago  Substance and Sexual Activity  . Alcohol use: Yes    Alcohol/week: 3.0 standard drinks    Types: 3 Cans of beer per week    Comment: sometimes 3 beers daily  . Drug use: No  . Sexual activity: Not on file    Comment: Not reported  Lifestyle  . Physical activity:    Days per week: Not on file    Minutes per session: Not on file  . Stress: Not on file  Relationships  . Social connections:    Talks on phone: Not on file    Gets together: Not on file    Attends religious service: Not on file    Active member of club or organization: Not on file    Attends meetings of clubs or organizations: Not on file    Relationship status: Not on file  . Intimate partner violence:    Fear of current or ex partner: Not on file    Emotionally abused: Not on file    Physically abused: Not on file    Forced sexual activity: Not on file  Other Topics Concern  . Not on file  Social History Narrative  . Not on file    Family History  Problem Relation Age of Onset  . Stroke Mother   . Cancer Mother   . COPD Mother   . Heart disease Father   . COPD Father   . Heart attack Father   . Heart Problems Brother        heart valve replaced at 44 years old  .  Heart Problems Brother        MI at age 29; CABG at age 77  . Cancer Maternal Grandmother   . Colon cancer Neg Hx     Past Medical History:  Diagnosis Date  . CAD (coronary artery disease), s/p stent 2011 and CABG 2015 [facility])   . Hodgkin disease ([place]) 1997  . Hyperlipidemia   . Macular degeneration   . PVC's (premature ventricular contractions)     Past Surgical History:  Procedure Laterality Date  . CARDIAC CATHETERIZATION  08/2009   PCI -LAD 2.80mmx18mm Xience  . CORONARY ARTERY BYPASS GRAFT  02/2014    Current Outpatient Medications  Medication Sig Dispense Refill  . aspirin 81 MG tablet Take 81 mg by mouth daily.    Marland Kitchen BESIVANCE 0.6 % SUSP as needed.   1  . carvedilol (COREG) 6.25 MG tablet Take 1 tablet by mouth twice daily. 180 tablet 1  . escitalopram (LEXAPRO) 10 MG tablet Take 1 tablet (10 mg total) by mouth daily. 90 tablet 1  . ibuprofen (ADVIL,MOTRIN) 200 MG tablet Take 200 mg by mouth every 6 (six) hours as needed.    Marland Kitchen LORazepam (ATIVAN) 0.5 MG tablet Take 1 tablet (0.5 mg total) by mouth 2 (two) times daily as needed for anxiety. 15 tablet 1  . metFORMIN (GLUCOPHAGE XR) 750 MG 24 hr tablet Take 1 tablet (750 mg total) by mouth daily with breakfast. 90 tablet 3  . nitroGLYCERIN (NITROSTAT) 0.4 MG SL tablet Place 1 tablet (0.4 mg total) under the tongue every 5 (five) minutes as needed for chest pain. 25 tablet 6  . NONFORMULARY OR COMPOUNDED ITEM Shots for macular degeneration left eye    . rosuvastatin (CRESTOR) 20 MG tablet Take 1 tablet (20 mg total) by mouth daily. 90 tablet 3   No current facility-administered medications for this visit.     Allergies as of 11/07/2018  . (No Known Allergies)    Vitals: There were no vitals taken for this visit. Last Weight:  Wt Readings from Last 1 Encounters:  09/05/18 173 lb (78.5 kg)   WUX:LKGMW is no height or weight on file to calculate BMI.     Last Height:   Ht Readings from Last 1 Encounters:   09/05/18 5\' 8"  (1.727 m)    OBSERVATION  General: The patient is awake, alert and appears not in acute distress. The patient is well groomed. Head: Normocephalic, atraumatic. Neck is supple. Mallampati 2.  neck circumference: 16" Nasal airflow PATENT ,  Cardiovascular: Respiratory: Skin:  Deferred Trunk: BMI is 28. The patient's posture is ERECTNeurologic exam : The patient is awake and alert, oriented to place and time.    Attention span & concentration ability appears normal.  Speech is fluent,  without  dysarthria, dysphonia or aphasia.  Mood and affect are appropriate.  Cranial nerves: intact smell and taste sensation.  Pupils are equal-. Extraocular movements  in vertical and horizontal planes intact -  Facial moFunduscopic exam deferred- faCIAL strength is symmetric and tongue and uvula move midline.  The patient has a peaked palate with lateral pillars encroaching.  He has full range of motion for the neck.  Shoulder shrug was symmetrical.   Motor exam:  Normal tone, muscle bulk and symmetric strength in all extremities.  Coordination: Movements in the fingers/hands was normal without evidence of ataxia, dysmetria or tremor.  Gait and station: Patient walks without assistive device   Assessment and Plan: Given Shane [name] Felts spouses observation, there is no doubt that he has sleep apnea.  He may be a thunder a snorer depending on sleep position but the report of irregular breathing and pausing and gasping describes obstructive sleep apnea.  I explained that due to the closure of our sleep lab during the coronavirus pandemic we  have the option of a home sleep test.  A home sleep test may be enough in his case to confirm the presence of apnea, evaluate for associated medical risk factors such as hypoxemia, bradycardia and tachycardia.  He agreed with following up with a home sleep test and a face-to-face visit should be possible within 3 months after this test has been completed.   He is also able to drive up and takes a dental device up here at the office.  Follow Up Instructions: HST explained, interventions such as CPAP or dental device were explained.     I discussed the assessment and treatment plan with the patient. The patient was provided an opportunity to ask questions and all were answered. The patient agreed with the plan and demonstrated an understanding of the instructions.   The patient was advised to call back or seek an in-person evaluation if the symptoms worsen or if the condition fails to improve as anticipated.  I provided 23 minutes of non-face-to-face time during this encounter.   Larey Seat, MD    Larey Seat, MD 9/44/9675, 91:63 AM  Certified in Neurology by ABPN Certified in [place] by [facility] [address] [place], [place] [address]             History of Present Illness:    Observations/Objective:

## 2018-11-07 NOTE — Patient Instructions (Signed)

## 2018-11-09 ENCOUNTER — Other Ambulatory Visit: Payer: Self-pay

## 2018-11-09 ENCOUNTER — Ambulatory Visit (INDEPENDENT_AMBULATORY_CARE_PROVIDER_SITE_OTHER): Payer: BLUE CROSS/BLUE SHIELD | Admitting: Psychiatry

## 2018-11-09 DIAGNOSIS — F331 Major depressive disorder, recurrent, moderate: Secondary | ICD-10-CM

## 2018-11-09 NOTE — Progress Notes (Signed)
[facility] Counselor/Therapist Progress Note  Patient ID: Shane [name], MRN: [id number],    Date: 11/09/2018  Time Spent: 60 minutes    8:00am to 9:00am  Virtual Visit via Telephone Note Connected with patient by a video enabled telemedicine/telehealth application or telephone, with their informed consent, and verified patient privacy and that I am speaking with the correct person using two identifiers. I am at [place] and patient is at home. I discussed the limitations, risks, security and privacy concerns of performing psychotherapy and management service by telephone and the availability of in person appointments. I also discussed with the patient that there may be a patient responsible charge related to this service. The patient expressed understanding and agreed to proceed. I discussed the treatment planning with the patient. The patient was provided an opportunity to ask questions and all were answered. The patient agreed with the plan and demonstrated an understanding of the instructions. The patient was advised to call  our office if  symptoms worsen or feel they are in a crisis state and need immediate contact.  Therapist Location: [facility] Patient Location: home     Treatment Type: Individual Therapy  Reported Symptoms:  Anxiety, depressed mood at times,  issues at work, dealing with uncertainty  Mental Status Exam:  Appearance:   Casual     Behavior:  Appropriate and Sharing  Motor:  Normal  Speech/Language:   Normal Rate  Affect:  Anxious  Mood:  anxious  Thought process:  normal  Thought content:    WNL  Sensory/Perceptual disturbances:    WNL  Orientation:  oriented to person, place, time/date, situation, day of week, month of year and year  Attention:  Good  Concentration:  Good  Memory:  WNL  Fund of knowledge:   Good  Insight:    Good  Judgment:   Good  Impulse Control:  Good   Risk Assessment: Danger to Self:   No Self-injurious Behavior: No Danger to Others: No Duty to Warn:no Physical Aggression / Violence:No  Access to Firearms a concern: No  Gang Involvement:No   Subjective:     Patient today reports symptoms noted above.  Symptoms are manageable and not really worse even during this time of pandemic, but feels he is managing them better with strategies that we reviewed again today (CBT).  Due to current coronavirus situation, he is working from home and "trying to cope as well as possible."   "Concerned with wife having bipolar diagnosis especially during this time" of the coronavirus situation.  Sleep is still problematic and his snoring is really loud.  Recently complete the home-version of sleep study and is to follow up with Dr on that, as he's not sure of what next steps are. Work-related issues discussed however they  have decreased some with him working from home temporarily.  With he and wife, communication issues involving trust, continue.  Encouraged a couples therapist for them and they are following through with appt when offices are able to see patients again in person.  In the meantime, he focuses on listening better and recognizing the disconnects more quickly in his communication with wife.  Reviewed how tone is so important in verbal communication--trying to recognize that how we say things can carry  as much weight as what we say.  Patient receptive.  Interventions: Solution-Oriented/Positive Psychology and Ego-Supportive  Diagnosis:   ICD-10-CM   1. Major depressive disorder, recurrent episode, moderate (HCC) F33.1  Plan:   Patient to follow through on strategies noted above in working with wife, further his communication skills to change unhealthy patterns and to really hear and work to understand others.  Review of coping skills. Next appt in about 2 wks.     [name], LCSW

## 2018-11-15 ENCOUNTER — Institutional Professional Consult (permissible substitution): Payer: Self-pay | Admitting: Neurology

## 2018-11-16 ENCOUNTER — Other Ambulatory Visit: Payer: Self-pay | Admitting: Cardiovascular Disease

## 2018-11-16 NOTE — Telephone Encounter (Signed)
Carvedilol 6.25 mg refilled. 

## 2018-11-20 ENCOUNTER — Other Ambulatory Visit: Payer: Self-pay | Admitting: Family Medicine

## 2018-11-20 DIAGNOSIS — F410 Panic disorder [episodic paroxysmal anxiety] without agoraphobia: Secondary | ICD-10-CM

## 2018-11-20 DIAGNOSIS — F321 Major depressive disorder, single episode, moderate: Secondary | ICD-10-CM

## 2018-11-20 NOTE — Telephone Encounter (Signed)
Last OV 09/05/18 Last refill 05/24/18 #90/1 Next OV 11/29/18

## 2018-11-23 ENCOUNTER — Other Ambulatory Visit: Payer: Self-pay

## 2018-11-23 ENCOUNTER — Ambulatory Visit (INDEPENDENT_AMBULATORY_CARE_PROVIDER_SITE_OTHER): Payer: BLUE CROSS/BLUE SHIELD | Admitting: Psychiatry

## 2018-11-23 DIAGNOSIS — F330 Major depressive disorder, recurrent, mild: Secondary | ICD-10-CM | POA: Diagnosis not present

## 2018-11-23 NOTE — Progress Notes (Addendum)
[facility] Counselor/Therapist Progress Note  Patient ID: Shane [name], MRN: [id number],    Date: 11/23/2018  Time Spent: 60 minutes    8:00am to 9:00am  Virtual Visit Note Connected with patient by a video enabled telemedicine/telehealth application or telephone, with their informed consent, and verified patient privacy and that I am speaking with the correct person using two identifiers. I am at [place] and patient is at home. I discussed the limitations, risks, security and privacy concerns of performing psychotherapy and management service by telephone and the availability of in person appointments. I also discussed with the patient that there may be a patient responsible charge related to this service. The patient expressed understanding and agreed to proceed. I discussed the treatment planning with the patient. The patient was provided an opportunity to ask questions and all were answered. The patient agreed with the plan and demonstrated an understanding of the instructions. The patient was advised to call  our office if  symptoms worsen or feel they are in a crisis state and need immediate contact.  Therapist Location: [facility] Patient Location: home    Treatment Type: Individual Therapy  Reported Symptoms:  Anxiety, depressed mood at times,  issues at work, dealing with uncertainty, some difficulty in communicating with wife  Mental Status Exam:  Appearance:   Casual     Behavior:  Appropriate and Sharing  Motor:  Normal  Speech/Language:   Normal Rate  Affect:  Anxious  Mood:  anxious  Thought process:  normal  Thought content:    WNL  Sensory/Perceptual disturbances:    WNL  Orientation:  oriented to person, place, time/date, situation, day of week, month of year and year  Attention:  Good  Concentration:  Good  Memory:  WNL  Fund of knowledge:   Good  Insight:    Good  Judgment:   Good  Impulse Control:  Good   Risk  Assessment: Danger to Self:  No Self-injurious Behavior: No Danger to Others: No Duty to Warn:no Physical Aggression / Violence:No  Access to Firearms a concern: No  Gang Involvement:No   Subjective:     Patient today reports symptoms noted above and haven't worsened. Symptoms are manageable even during this time of pandemic, but feels he is managing them better with strategies that we reviewed again today (CBT).   "Concerned with wife having bipolar diagnosis especially during this time" of the coronavirus situation.  Still waiting to totally complete sleep study process as patient "is waiting on a piece of equipment for him to use."  Work-related issues discussed however they  have decreased some with him working from home temporarily and any issues that have arisen, have been manageable.   With he and wife, communication issues involving trust, continue.  Encouraged a couples therapist for them and they are following through with appt when offices are able to see patients again in person.  In the meantime, he focuses on paying more attention to wife as she needs a lot of attention and reassurances, listening better, and recognizing the disconnects more quickly in his communication with wife.  "But I too need support and she doesn't always think of that."  They are doing the "check-ins" that were recommended last session and that helps some. He is taking breaks when he feels his anger rising.  Wife sometimes misunderstands and feels he is rejecting her in those times.   Interventions: Solution-Oriented/Positive Psychology and Ego-Supportive   Change in diagnosis:  Change from major depression moderate to major depression mild (see below).  Diagnosis:   ICD-10-CM   1. Depression, major, recurrent, mild ([place]) F33.0     Plan:   Patient to follow through on strategies noted above in working with wife, further his communication skills to change unhealthy patterns and to really hear and work to  understand others.  Review of coping skills. Next appt in 2 wks.     [name], LCSW

## 2018-11-24 ENCOUNTER — Encounter (INDEPENDENT_AMBULATORY_CARE_PROVIDER_SITE_OTHER): Payer: BLUE CROSS/BLUE SHIELD | Admitting: Ophthalmology

## 2018-11-24 ENCOUNTER — Encounter: Payer: BLUE CROSS/BLUE SHIELD | Admitting: Family Medicine

## 2018-11-24 ENCOUNTER — Other Ambulatory Visit: Payer: Self-pay

## 2018-11-24 DIAGNOSIS — H353221 Exudative age-related macular degeneration, left eye, with active choroidal neovascularization: Secondary | ICD-10-CM

## 2018-11-24 DIAGNOSIS — H43813 Vitreous degeneration, bilateral: Secondary | ICD-10-CM | POA: Diagnosis not present

## 2018-11-24 DIAGNOSIS — I10 Essential (primary) hypertension: Secondary | ICD-10-CM

## 2018-11-24 DIAGNOSIS — H35033 Hypertensive retinopathy, bilateral: Secondary | ICD-10-CM

## 2018-11-29 ENCOUNTER — Encounter: Payer: Self-pay | Admitting: Family Medicine

## 2018-11-29 ENCOUNTER — Other Ambulatory Visit: Payer: Self-pay

## 2018-11-29 ENCOUNTER — Ambulatory Visit (INDEPENDENT_AMBULATORY_CARE_PROVIDER_SITE_OTHER): Payer: BLUE CROSS/BLUE SHIELD | Admitting: Family Medicine

## 2018-11-29 VITALS — BP 124/62 | HR 78 | Temp 98.6°F | Ht 67.5 in | Wt 172.0 lb

## 2018-11-29 DIAGNOSIS — F321 Major depressive disorder, single episode, moderate: Secondary | ICD-10-CM

## 2018-11-29 DIAGNOSIS — Z0000 Encounter for general adult medical examination without abnormal findings: Secondary | ICD-10-CM | POA: Diagnosis not present

## 2018-11-29 DIAGNOSIS — F410 Panic disorder [episodic paroxysmal anxiety] without agoraphobia: Secondary | ICD-10-CM

## 2018-11-29 DIAGNOSIS — R945 Abnormal results of liver function studies: Secondary | ICD-10-CM

## 2018-11-29 DIAGNOSIS — F1010 Alcohol abuse, uncomplicated: Secondary | ICD-10-CM

## 2018-11-29 DIAGNOSIS — R7989 Other specified abnormal findings of blood chemistry: Secondary | ICD-10-CM

## 2018-11-29 LAB — COMPREHENSIVE METABOLIC PANEL
ALT: 59 U/L — ABNORMAL HIGH (ref 0–53)
AST: 42 U/L — ABNORMAL HIGH (ref 0–37)
Albumin: 4.4 g/dL (ref 3.5–5.2)
Alkaline Phosphatase: 70 U/L (ref 39–117)
BUN: 20 mg/dL (ref 6–23)
CO2: 24 mEq/L (ref 19–32)
Calcium: 9.1 mg/dL (ref 8.4–10.5)
Chloride: 105 mEq/L (ref 96–112)
Creatinine, Ser: 1.2 mg/dL (ref 0.40–1.50)
GFR: 63.09 mL/min (ref >60.00)
Glucose, Bld: 107 mg/dL — ABNORMAL HIGH (ref 70–99)
Potassium: 4.5 mEq/L (ref 3.5–5.1)
Sodium: 139 mEq/L (ref 135–145)
Total Bilirubin: 0.5 mg/dL (ref 0.2–1.2)
Total Protein: 6.8 g/dL (ref 6.0–8.3)

## 2018-11-29 LAB — LIPASE: Lipase: 125 U/L — ABNORMAL HIGH (ref 11.0–59.0)

## 2018-11-29 MED ORDER — ESCITALOPRAM OXALATE 10 MG PO TABS: 10.0000 mg | ORAL_TABLET | Freq: Every day | ORAL | 0 refills | Status: DC

## 2018-11-29 NOTE — Progress Notes (Signed)
Subjective:    Shane [name] is a 53 y.o. male who presents today for his Complete Annual Exam.    Current Outpatient Medications:  .  aspirin 81 MG tablet, Take 81 mg by mouth daily., Disp: , Rfl:  .  BESIVANCE 0.6 % SUSP, as needed. , Disp: , Rfl: 1 .  carvedilol (COREG) 6.25 MG tablet, Take 1 tablet by mouth twice daily., Disp: 180 tablet, Rfl: 0 .  escitalopram (LEXAPRO) 10 MG tablet, Take 1 tablet (10 mg total) by mouth daily., Disp: 90 tablet, Rfl: 0 .  ibuprofen (ADVIL,MOTRIN) 200 MG tablet, Take 200 mg by mouth every 6 (six) hours as needed., Disp: , Rfl:  .  LORazepam (ATIVAN) 0.5 MG tablet, Take 1 tablet (0.5 mg total) by mouth 2 (two) times daily as needed for anxiety., Disp: 15 tablet, Rfl: 1 .  metFORMIN (GLUCOPHAGE XR) 750 MG 24 hr tablet, Take 1 tablet (750 mg total) by mouth daily with breakfast., Disp: 90 tablet, Rfl: 3 .  nitroGLYCERIN (NITROSTAT) 0.4 MG SL tablet, Place 1 tablet (0.4 mg total) under the tongue every 5 (five) minutes as needed for chest pain., Disp: 25 tablet, Rfl: 6 .  NONFORMULARY OR COMPOUNDED ITEM, Shots for macular degeneration left eye, Disp: , Rfl:  .  rosuvastatin (CRESTOR) 20 MG tablet, Take 1 tablet (20 mg total) by mouth daily., Disp: 90 tablet, Rfl: 3  There are no preventive care reminders to display for this patient.  PMHx, SurgHx, SocialHx, Medications, and Allergies were reviewed in the Visit Navigator and updated as appropriate.   Past Medical History:  Diagnosis Date  . CAD (coronary artery disease), s/p stent 2011 and CABG 2015 [facility])   . Hodgkin disease ([place]) 1997  . Hyperlipidemia   . Macular degeneration   . PVC's (premature ventricular contractions)      Past Surgical History:  Procedure Laterality Date  . CARDIAC CATHETERIZATION  08/2009   PCI -LAD 2.73mmx18mm Xience  . CORONARY ARTERY BYPASS GRAFT  02/2014     Family History  Problem Relation Age of Onset  . Stroke Mother   . Cancer Mother   .  COPD Mother   . Heart disease Father   . COPD Father   . Heart attack Father   . Heart Problems Brother        heart valve replaced at 42 years old  . Heart Problems Brother        MI at age 27; CABG at age 31  . Cancer Maternal Grandmother   . Colon cancer Neg Hx     Social History   Tobacco Use  . Smoking status: Never Smoker  . Smokeless tobacco: Former Systems developer  . Tobacco comment: a long time ago  Substance Use Topics  . Alcohol use: Yes    Alcohol/week: 3.0 standard drinks    Types: 3 Cans of beer per week    Comment: sometimes 3 beers daily  . Drug use: No    Review of Systems:   Pertinent items are noted in the HPI. Otherwise, ROS is negative.  Objective:   Vitals:   11/29/18 0902  BP: 124/62  Pulse: 78  Temp: 98.6 F (37 C)  SpO2: 95%   Body mass index is 26.54 kg/m.  General Appearance:  Alert, cooperative, no distress, appears stated age  Head:  Normocephalic, without obvious abnormality, atraumatic  Eyes:  PERRL, conjunctiva/corneas clear, EOM's intact, fundi benign, both eyes  Ears:  Normal TM's and external ear canals, both ears  Nose: Nares normal, septum midline, mucosa normal, no drainage    or sinus tenderness  Throat: Lips, mucosa, and tongue normal; teeth and gums normal  Neck: Supple, symmetrical, trachea midline, no adenopathy; thyroid:  No enlargement/tenderness/nodules; no carotit bruit or JVD  Back:   Symmetric, no curvature, ROM normal, no CVA tenderness  Lungs:   Clear to auscultation bilaterally, respirations unlabored  Chest wall:  No tenderness or deformity  Heart:  Regular rate and rhythm, S1 and S2 normal, no murmur, rub   or gallop  Abdomen:   Soft, non-tender, bowel sounds active all four quadrants, no masses, no organomegaly  Extremities: Extremities normal, atraumatic, no cyanosis or edema  Prostate: Not done.   Skin: Skin color, texture, turgor normal, no rashes or lesions  Lymph nodes: Cervical, supraclavicular, and  axillary nodes normal  Neurologic: CNII-XII grossly intact. Normal strength, sensation and reflexes throughout   Assessment/Plan:   Shane [name] was seen today for annual exam.  Diagnoses and all orders for this visit:  Routine physical examination  Elevated LFTs Comments: Recheck today.  Orders: -     Comprehensive metabolic panel -     Lipase  Depression, major, single episode, moderate (HCC) Comments: Marriage issues. Wife with mood disorder. Orders: -     escitalopram (LEXAPRO) 10 MG tablet; Take 1 tablet (10 mg total) by mouth daily.  Anxiety attack -     escitalopram (LEXAPRO) 10 MG tablet; Take 1 tablet (10 mg total) by mouth daily.  Alcohol use disorder, mild, abuse Comments: 6 beer throughout the day. Bored. Anxiety.    Patient Counseling: [x]   Nutrition: Stressed importance of moderation in sodium/caffeine intake, saturated fat and cholesterol, caloric balance, sufficient intake of fresh fruits, vegetables, and fiber.  [x]   Stressed the importance of regular exercise.   []   Substance Abuse: Discussed cessation/primary prevention of tobacco, alcohol, or other drug use; driving or other dangerous activities under the influence; availability of treatment for abuse.   [x]   Injury prevention: Discussed safety belts, safety helmets, smoke detector, smoking near bedding or upholstery.   []   Sexuality: Discussed sexually transmitted diseases, partner selection, use of condoms, avoidance of unintended pregnancy and contraceptive alternatives.   [x]   Dental health: Discussed importance of regular tooth brushing, flossing, and dental visits.  [x]   Health maintenance and immunizations reviewed. Please refer to Health maintenance section.    Briscoe Deutscher, DO [place]

## 2018-12-11 ENCOUNTER — Other Ambulatory Visit: Payer: Self-pay

## 2018-12-11 ENCOUNTER — Ambulatory Visit (INDEPENDENT_AMBULATORY_CARE_PROVIDER_SITE_OTHER): Payer: BLUE CROSS/BLUE SHIELD | Admitting: Psychiatry

## 2018-12-11 DIAGNOSIS — F330 Major depressive disorder, recurrent, mild: Secondary | ICD-10-CM

## 2018-12-11 NOTE — Progress Notes (Signed)
[facility] Counselor/Therapist Progress Note  Patient ID: Shane [name], MRN: [id number],    Date: 12/11/2018  Time Spent: 60 minutes    11:00am to 12:00noon  Virtual Visit Note Connected with patient by a video enabled telemedicine/telehealth application or telephone, with their informed consent, and verified patient privacy and that I am speaking with the correct person using two identifiers. I am at [place] and patient is at home. I discussed the limitations, risks, security and privacy concerns of performing psychotherapy and management service by telephone and the availability of in person appointments. I also discussed with the patient that there may be a patient responsible charge related to this service. The patient expressed understanding and agreed to proceed. I discussed the treatment planning with the patient. The patient was provided an opportunity to ask questions and all were answered. The patient agreed with the plan and demonstrated an understanding of the instructions. The patient was advised to call  our office if  symptoms worsen or feel they are in a crisis state and need immediate contact.  Therapist Location: [facility] Patient Location: home  Treatment Type: Individual Therapy  Reported Symptoms:  Anxiety, depressed mood at times,  issues at work, dealing with uncertainty, some difficulty in communicating with wife, anger, frustration  Mental Status Exam:  Appearance:   N/A    Telehealth  Behavior:  Appropriate and Sharing  Motor:   N/A    Telehealth  Speech/Language:   Normal Rate  Affect:  N/A    Telehealth  Mood:  anxious, anger, frustrated  Thought process:  normal  Thought content:    WNL  Sensory/Perceptual disturbances:    WNL  Orientation:  oriented to person, place, time/date, situation, day of week, month of year and year  Attention:  Good  Concentration:  Good  Memory:  WNL  Fund of knowledge:   Good   Insight:    Good  Judgment:   Good  Impulse Control:  Good   Risk Assessment: Danger to Self:  No Self-injurious Behavior: No Danger to Others: No Duty to Warn:no Physical Aggression / Violence:No  Access to Firearms a concern: No  Gang Involvement:No   Subjective:    Patient frustrated, angry, and having increasingly difficult time communicating with his wife as she continues to be distrustful of him and rejects whatever he says to her.  Vented at length about his frustrations and anger. Some occasional better times but recently.  States he is emotionally tired, due to marital communication and trust issues, and being more "cooped up".  Has found it helpful some to go outside and walk or just to get fresh air. Reports his anxiety is some worse and depression about the same, after lessening some. Shared that "I've gotten lazy and not moving around as much."  Thought of things that have helped and he reports the getting out of house, walking, going for a drive.  It is noticeable that after patient vented freely, he does not seem as angry and tense. Sleep and appetite are ok. Is hoping that appt with marital therapist is set up soon as they had to delay it due to COVID-19.  Reviewed goals and progress/need areas noted.  Also reviewed strategies to help with his symptoms noted above as well as self-care activities that he may find helpful.  Is to be scheduling appt with marital therapist today and hoping to be seen soon.    Interventions: Solution-Oriented/Positive Psychology and Ego-Supportive  Diagnosis:   ICD-10-CM   1. Depression, major, recurrent, mild ([place]) F33.0     Plan:   Patient to follow through on strategies noted above in working with wife, further his communication skills to change unhealthy patterns and to really hear and work to understand others.  Review of coping skills. To follow through on getting appt with maritil therapist. Next appt in 2 wks.     [name],  LCSW

## 2018-12-18 ENCOUNTER — Ambulatory Visit (INDEPENDENT_AMBULATORY_CARE_PROVIDER_SITE_OTHER): Payer: BLUE CROSS/BLUE SHIELD | Admitting: Neurology

## 2018-12-18 ENCOUNTER — Other Ambulatory Visit: Payer: Self-pay

## 2018-12-18 DIAGNOSIS — G4733 Obstructive sleep apnea (adult) (pediatric): Secondary | ICD-10-CM | POA: Diagnosis not present

## 2018-12-18 DIAGNOSIS — E039 Hypothyroidism, unspecified: Secondary | ICD-10-CM

## 2018-12-18 DIAGNOSIS — E8881 Metabolic syndrome: Secondary | ICD-10-CM

## 2018-12-18 DIAGNOSIS — I493 Ventricular premature depolarization: Secondary | ICD-10-CM

## 2018-12-18 DIAGNOSIS — E038 Other specified hypothyroidism: Secondary | ICD-10-CM

## 2018-12-18 DIAGNOSIS — I25700 Atherosclerosis of coronary artery bypass graft(s), unspecified, with unstable angina pectoris: Secondary | ICD-10-CM

## 2018-12-18 DIAGNOSIS — G4730 Sleep apnea, unspecified: Secondary | ICD-10-CM

## 2018-12-25 NOTE — Procedures (Signed)
Patient Information     First Name: Shane Last Name: [name] ID: [id number]  Birth Date: [birth year] Age: 53 Gender: Male  Referring Provider: Briscoe Deutscher, DO BMI: 26.4 (W=174 lb, H=5' 8'')  Neck Circ.:  16 '' Epworth:  2/24   Sleep Study Information    Study Date: Dec 18, 2018 S/H/A Version: 001.001.001.001 / 4.1.1528 / 54   History: Mr. Gindlesperger states that his wife complains about his loud snoring, gasping, and choking which she is concerned about.  She also suffers from insomnia and feels that this contributes to her inability to find restful sleep.  The patient himself is under the impression that he sleeps rather calmly and has no problems with sleep.  Has diagnosis including: Insulin resistance hyperglycemia, hypothyroidism, macular degeneration the right forearm affecting the right eye for which she is treated at the retina center of his injections.  Premature ventricular contractions, coronary artery disease with an artery bypass at a very young age.  He is also a survivor of Hodgkin's lymphoma diagnosed in 1997 and he does have problems with hyperlipidemia which is medically controlled.      Summary & Diagnosis:     The test recorded a moderate- severe degree of Sleep Apnea, with an AHI of 33.7h and NREM sleep accentuation. He had less apnea in REM sleep (AHI -REM 16/h) , which is unusual. Frequent brief episodes of oxygen desaturation were noted, but no clinical significant, sustained hypoxia.   Recommendations:     Given the comorbidities and the not mild form of apnea, I strongly recommend use of CPAP therapy.  The patient will be prescribed an autotitration capable CPAP device with heated humidity and mask of his choice and comfort. Settings will be 6-16 cm water pressure with 3 cm water EPR.   Electronically Signed: Larey Seat , MD   12-25-2018            Sleep Summary  Oxygen Saturation Statistics    Start Study Time: End Study Time: Total Recording  Time:  11:12:02 PM   7:53:19 AM   8 h, 41 min  Total Sleep Time % REM of Sleep Time:  7 h, 42 min  16.0    Mean: 96 Minimum: 87 Maximum: 100  Mean of Desaturations Nadirs (%):   93  Oxygen Desaturation %:   4-9 10-20 >20 Total  Events Number Total   157  1 99.4 0.6  0 0.0  158 100.0  Oxygen Saturation: <90 <=88 <85 <80 <70  Duration (minutes): Sleep % 0.3 0.1  0.1 0.0  0.0 0.0 0.0 0.0 0.0 0.0     Respiratory Indices      Total Events REM NREM All Night  pRDI:  260  pAHI:  256 ODI:  158  pAHIc:  40  % CSR: 0.0 18.8 16.4 10.6 3.3 37.2 37.1 22.8 5.7 34.3 33.7 20.8 5.3       Pulse Rate Statistics during Sleep (BPM)      Mean:  65 Minimum: N/A Maximum: 81    Indices are calculated using technically valid sleep time of  7 hrs, 35 min. pRDI/pAHI are calculated using oxi desaturations ? 3%  Body Position Statistics  Position Supine Prone Right Left Non-Supine  Sleep (min) 161.5 0.0 219.0 81.5 300.5  Sleep % 35.0 0.0 47.4 17.6 65.0  pRDI 77.6 N/A 11.0 12.1 11.3  pAHI 77.6 N/A 10.2 11.4 10.5  ODI 54.4 N/A 3.6 1.5 3.0     Snoring Statistics Snoring  Level (dB) >40 >50 >60 >70 >80 >Threshold (45)  Sleep (min) 35.0 3.8 1.5 0.1 0.0 5.8  Sleep % 7.6 0.8 0.3 0.0 0.0 1.3    Mean: 40 dB Sleep Stages Chart                                                   pAHI=33.7                                                               Mild              Moderate                    Severe                                                 5              15                    30

## 2018-12-25 NOTE — Addendum Note (Signed)
Addended by: Larey Seat on: 12/25/2018 05:13 PM   Modules accepted: Orders

## 2018-12-26 ENCOUNTER — Telehealth: Payer: Self-pay | Admitting: Neurology

## 2018-12-26 NOTE — Telephone Encounter (Signed)
-----   Message from Larey Seat, MD sent at 12/25/2018  5:13 PM EDT ----- The test recorded a moderate- severe degree of Sleep Apnea, with an AHI of 33.7h and NREM sleep accentuation. He had less apnea in REM sleep (AHI -REM 16/h) , which is unusual. Frequent brief episodes of oxygen desaturation were noted, but no clinical significant and sustained hypoxia.   Recommendations:    Given the comorbidities and the not mild form of apnea, I strongly recommend use of CPAP therapy.  The patient will be prescribed an autotitration capable CPAP device with heated humidity and mask of his choice and comfort. Settings will be 6-16 cm water pressure with 3 cm water EPR.   Electronically Signed: Larey Seat , MD   12-25-2018

## 2018-12-26 NOTE — Telephone Encounter (Signed)
I called pt. I advised pt that Dr. Brett Fairy reviewed their sleep study results and found that pt moderate to severe sleep apnea. Dr. Brett Fairy recommends that pt starts auto CPAP. I reviewed PAP compliance expectations with the pt. Pt is agreeable to starting a CPAP. I advised pt that an order will be sent to a DME, [facility], and [facility] will call the pt within about one week after they file with the pt's insurance. [facility] will show the pt how to use the machine, fit for masks, and troubleshoot the CPAP if needed. A follow up appt was made for insurance purposes with Ward Givens on Aug 20,2020 at 10:30 am. Pt verbalized understanding to arrive 15 minutes early and bring their CPAP. A letter with all of this information in it will be mailed to the pt as a reminder. I verified with the pt that the address we have on file is correct. Pt verbalized understanding of results. Pt had no questions at this time but was encouraged to call back if questions arise. I have sent the order to [facility] and have received confirmation that they have received the order.

## 2019-01-03 ENCOUNTER — Other Ambulatory Visit: Payer: Self-pay

## 2019-01-03 ENCOUNTER — Ambulatory Visit (INDEPENDENT_AMBULATORY_CARE_PROVIDER_SITE_OTHER): Payer: BC Managed Care – PPO | Admitting: Psychiatry

## 2019-01-03 DIAGNOSIS — F330 Major depressive disorder, recurrent, mild: Secondary | ICD-10-CM | POA: Diagnosis not present

## 2019-01-03 NOTE — Addendum Note (Signed)
Addended byShanon Ace on: 01/03/2019 12:04 PM   Modules accepted: Level of Service

## 2019-01-03 NOTE — Progress Notes (Signed)
      [facility] Counselor/Therapist Progress Note  Patient ID: Shane [name], MRN: [id number],    Date: 01/03/2019  Time Spent: 60 minutes    9:00am to 10:00am   Treatment Type: Individual Therapy  Reported Symptoms:  Anxiety, depressed mood at times, dealing with uncertainty, some difficulty in communicating with wife, anger, frustration  Mental Status Exam:  Appearance:    n/a   (teletherapy)  Behavior:  Appropriate and Sharing  Motor:   n/a  (teletherapy)  Speech/Language:   Normal Rate  Affect:   n/a   (teletherapy)  Mood:  anxious, anger, frustrated  Thought process:  normal  Thought content:    WNL  Sensory/Perceptual disturbances:    WNL  Orientation:  oriented to person, place, time/date, situation, day of week, month of year and year  Attention:  Good  Concentration:  Good  Memory:  WNL  Fund of knowledge:   Good  Insight:    Good  Judgment:   Good  Impulse Control:  Good   Risk Assessment: Danger to Self:  No Self-injurious Behavior: No Danger to Others: No Duty to Warn:no Physical Aggression / Violence:No  Access to Firearms a concern: No  Gang Involvement:No   Subjective:  Patient still struggling with the above named symptoms however is feeling some relief as  he and wife were able to have their first meeting with a marital therapist (virtual video visit).  Feels it went fine and allowed them to get started working together on relationship.  Patient is feeling some more hope now that marital therapy has started.  Some less anger and frustration, but communication remains in need of improvement.  Vented some frustration and anger about another recent episode of wife making accusations.  This seemed to help him to have safe outlet to vent.  Looking forward to their next marital session tomorrow. Also has found it helpful some to go outside and walk or just to get fresh air. going for a drive.  Sleep and appetite are ok. Reviewed goals and progress/need  areas noted.  Also reviewed strategies to help with his symptoms noted above as well as self-care activities that he may find helpful.   Interventions: Solution-Oriented/Positive Psychology and Ego-Supportive    Diagnosis:   ICD-10-CM   1. Depression, major, recurrent, mild ([place])  F33.0     Plan:   Patient to follow through on self-care and strategies noted above in working with wife, further his communication skills to change unhealthy patterns and to really hear and work to understand others.  Review of coping skills. To follow through on getting appt with maritil therapist. Next appt in 2 wks.     [name], LCSW

## 2019-01-11 ENCOUNTER — Encounter (INDEPENDENT_AMBULATORY_CARE_PROVIDER_SITE_OTHER): Payer: BLUE CROSS/BLUE SHIELD | Admitting: Ophthalmology

## 2019-01-11 ENCOUNTER — Other Ambulatory Visit: Payer: Self-pay

## 2019-01-11 DIAGNOSIS — H353221 Exudative age-related macular degeneration, left eye, with active choroidal neovascularization: Secondary | ICD-10-CM

## 2019-01-11 DIAGNOSIS — H2513 Age-related nuclear cataract, bilateral: Secondary | ICD-10-CM

## 2019-01-11 DIAGNOSIS — H35033 Hypertensive retinopathy, bilateral: Secondary | ICD-10-CM

## 2019-01-11 DIAGNOSIS — I10 Essential (primary) hypertension: Secondary | ICD-10-CM

## 2019-01-11 DIAGNOSIS — G4733 Obstructive sleep apnea (adult) (pediatric): Secondary | ICD-10-CM | POA: Diagnosis not present

## 2019-01-11 DIAGNOSIS — H43813 Vitreous degeneration, bilateral: Secondary | ICD-10-CM

## 2019-01-17 ENCOUNTER — Other Ambulatory Visit: Payer: Self-pay

## 2019-01-17 ENCOUNTER — Ambulatory Visit (INDEPENDENT_AMBULATORY_CARE_PROVIDER_SITE_OTHER): Payer: BC Managed Care – PPO | Admitting: Psychiatry

## 2019-01-17 DIAGNOSIS — F330 Major depressive disorder, recurrent, mild: Secondary | ICD-10-CM

## 2019-01-17 NOTE — Progress Notes (Signed)
[facility] Counselor/Therapist Progress Note  Patient ID: Shane [name], MRN: [id number],    Date: 01/17/2019  Time Spent: 60 minutes    9:00am to 10:00am  Virtual Visit Note Connected with patient by a video enabled telemedicine/telehealth application or telephone, with their informed consent, and verified patient privacy and that I am speaking with the correct person using two identifiers. I discussed the limitations, risks, security and privacy concerns of performing psychotherapy and management service by telephone and the availability of in person appointments. I also discussed with the patient that there may be a patient responsible charge related to this service. The patient expressed understanding and agreed to proceed. I discussed the treatment planning with the patient. The patient was provided an opportunity to ask questions and all were answered. The patient agreed with the plan and demonstrated an understanding of the instructions. The patient was advised to call  our office if  symptoms worsen or feel they are in a crisis state and need immediate contact.   Therapist Location: [facility] Patient Location: home   Treatment Type: Individual Therapy  Reported Symptoms:  Anxiety, depressed mood at times, dealing with uncertainty, some difficulty in communicating with wife, anger, frustration  Mental Status Exam:  Appearance:    n/a   (teletherapy)  Behavior:  Appropriate and Sharing  Motor:   n/a  (teletherapy)  Speech/Language:   Normal Rate  Affect:   n/a   (teletherapy)  Mood:  anxious, anger, frustrated  Thought process:  normal  Thought content:    WNL  Sensory/Perceptual disturbances:    WNL  Orientation:  oriented to person, place, time/date, situation, day of week, month of year and year  Attention:  Good  Concentration:  Good  Memory:  WNL  Fund of knowledge:   Good  Insight:    Good  Judgment:   Good  Impulse Control:  Good   Risk  Assessment: Danger to Self:  No Self-injurious Behavior: No Danger to Others: No Duty to Warn:no Physical Aggression / Violence:No  Access to Firearms a concern: No  Gang Involvement:No   Subjective:   Patient shares the symptoms listed above.  Today hs is very angry with wife as they just recently started marital therapy and he feels wife has sabotaged the couple sessions they've had so far. It does sound as though things didn't go well in those marital sessions and reportedly wife talked over him and the therapist involved.  Very frustrating for patient as he was hoping for some positive marital changes through the marital therapy efforts.  He vented his frustration, disappointment, and anger, but was later able to calm himself and re-focus on the fact they haven't had but 3 session so far and 1 was an initial intake, so they do have opportunity to re-group and still get marital help. He spoke about how he could ask the therapist up front about them agreeing on some "house rules" re: sharing the talk-time, listening without interrupting, and having mutual respect.   When his stress escalates he is able to practice strategies discussed in sessions.  Also finds some relief in going outside to walk or just get fresh air.  Sleep and appetite are ok.  Denies any SI or HI. Reviewed goals and progress/need areas noted in addition to healthy self-care habits.     Interventions: Solution-Oriented/Positive Psychology and Ego-Supportive    Diagnosis:   ICD-10-CM   1. Depression, major, recurrent, mild ([place])  F33.0  Plan:   Patient to follow through on self-care and strategies noted above in working with wife, further his communication skills to change unhealthy patterns and to really hear and understand, while trying to refrain from responding in ways that tends to lead to wife's emotional escalation.  Next appt in 2 wks.     [name], LCSW

## 2019-01-31 ENCOUNTER — Ambulatory Visit (INDEPENDENT_AMBULATORY_CARE_PROVIDER_SITE_OTHER): Payer: BC Managed Care – PPO | Admitting: Psychiatry

## 2019-01-31 ENCOUNTER — Other Ambulatory Visit: Payer: Self-pay

## 2019-01-31 DIAGNOSIS — F331 Major depressive disorder, recurrent, moderate: Secondary | ICD-10-CM

## 2019-01-31 NOTE — Progress Notes (Signed)
[facility] Counselor/Therapist Progress Note  Patient ID: Shane [name], MRN: [id number],    Date: 01/31/2019  Time Spent: 60 minutes    9:00am to 10:00am  Virtual Visit Note Connected with patient by a video enabled telemedicine/telehealth application or telephone, with their informed consent, and verified patient privacy and that I am speaking with the correct person using two identifiers. I discussed the limitations, risks, security and privacy concerns of performing psychotherapy and management service by telephone and the availability of in person appointments. I also discussed with the patient that there may be a patient responsible charge related to this service. The patient expressed understanding and agreed to proceed. I discussed the treatment planning with the patient. The patient was provided an opportunity to ask questions and all were answered. The patient agreed with the plan and demonstrated an understanding of the instructions. The patient was advised to call  our office if  symptoms worsen or feel they are in a crisis state and need immediate contact.   Therapist Location: [facility] Patient Location: home   Treatment Type: Individual Therapy  Reported Symptoms:  Anxiety, depressed mood at times, dealing with uncertainty, some difficulty in communicating with wife, anger, frustration  Mental Status Exam:  Appearance:    n/a   (teletherapy)  Behavior:  Appropriate and Sharing  Motor:   n/a  (teletherapy)  Speech/Language:   Normal Rate  Affect:   n/a   (teletherapy)  Mood:  anxious, anger, frustrated; some depressed mood  Thought process:  normal  Thought content:    WNL  Sensory/Perceptual disturbances:    WNL  Orientation:  oriented to person, place, time/date, situation, day of week, month of year and year  Attention:  Good  Concentration:  Good  Memory:  WNL  Fund of knowledge:   Good  Insight:    Good  Judgment:   Good  Impulse  Control:  Good   Risk Assessment: Danger to Self:  No Self-injurious Behavior: No Danger to Others: No Duty to Warn:no Physical Aggression / Violence:No  Access to Firearms a concern: No  Gang Involvement:No   Subjective:   Patient shares the symptoms listed above remain accurate.  Again, is very frustrated with his marital situation and how wife seems to "control and sabotage" them having a better life together. He does acknowledge that he is not always patient with her and will sometimes verbally vent or either walk outside to "take a break".  Wife prefers to stay in bed most of day even though her doctor has encouraged her to be up and more active. (Not all info in note due to patient and wife privacy needs.)  Discussed coping skills and strategies with husband and reviewed some we have discussed in prior sessions.  He is in a difficult situation and trying to be more patient and understanding.     He is very disappointed and angry with wife as they just recently started marital therapy and he feels wife has sabotaged the few sessions they've had so far. It does sound as though things didn't go well in those sessions and reportedly wife talked over him and the therapist involved.  Very frustrating for patient as he was hoping for some positive marital changes through the marital therapy efforts.  He vented his frustration, disappointment, and anger, but was later able to calm himself and re-focus on the fact they've only had about 3-4 session, so they do have opportunity to re-group and still  make some gains. He spoke about how he plans to ask the therapist up front about them agreeing on some "house rules" re: sharing the talk-time, listening without interrupting, and having mutual respect.   When his stress escalates he is able to practice strategies discussed in sessions.  Also finds some relief in going outside to walk or just get fresh air, and wants to start back exercising more however his  wife wants him to stay home, even though there is not a need for him to do so.  Denies any SI or HI. Reviewed goals and progress/need areas noted in addition to healthy self-care habits.     Interventions: Solution-Oriented/Positive Psychology and Ego-Supportive    Diagnosis:   ICD-10-CM   1. Major depressive disorder, recurrent episode, moderate ([place])  F33.1     Plan:   Patient to follow through on self-care and strategies noted above in working with wife, further his communication skills to change unhealthy patterns and to really hear and understand, while trying to refrain from responding in ways that tends to lead to wife's emotional escalation.  Next appt in 2 wks.     [name], LCSW

## 2019-02-09 ENCOUNTER — Other Ambulatory Visit: Payer: Self-pay | Admitting: Cardiovascular Disease

## 2019-02-11 DIAGNOSIS — G4733 Obstructive sleep apnea (adult) (pediatric): Secondary | ICD-10-CM | POA: Diagnosis not present

## 2019-02-14 ENCOUNTER — Ambulatory Visit (INDEPENDENT_AMBULATORY_CARE_PROVIDER_SITE_OTHER): Payer: BC Managed Care – PPO | Admitting: Psychiatry

## 2019-02-14 ENCOUNTER — Other Ambulatory Visit: Payer: Self-pay

## 2019-02-14 DIAGNOSIS — F331 Major depressive disorder, recurrent, moderate: Secondary | ICD-10-CM

## 2019-02-14 NOTE — Progress Notes (Signed)
[facility] Counselor/Therapist Progress Note  Patient ID: Shane [name], MRN: [id number],    Date: 02/14/2019  Time Spent: 60 minutes    10:00am to 11:00am  Virtual Visit Note Connected with patient by a video enabled telemedicine/telehealth application or telephone, with their informed consent, and verified patient privacy and that I am speaking with the correct person using two identifiers. I discussed the limitations, risks, security and privacy concerns of performing psychotherapy and management service by telephone and the availability of in person appointments. I also discussed with the patient that there may be a patient responsible charge related to this service. The patient expressed understanding and agreed to proceed. I discussed the treatment planning with the patient. The patient was provided an opportunity to ask questions and all were answered. The patient agreed with the plan and demonstrated an understanding of the instructions. The patient was advised to call  our office if  symptoms worsen or feel they are in a crisis state and need immediate contact.   Therapist Location: [facility] Patient Location: home   Treatment Type: Individual Therapy  Reported Symptoms:  Anger, Anxiety, depressed mood at times, dealing with uncertainty, some difficulty in communicating with wife,  frustration  Mental Status Exam:  Appearance:    n/a   (teletherapy)  Behavior:  Appropriate and Sharing  Motor:   n/a  (teletherapy)  Speech/Language:   Normal Rate  Affect:   n/a   (teletherapy)  Mood:  anxious, anger, frustrated; some depressed mood  Thought process:  normal  Thought content:    WNL  Sensory/Perceptual disturbances:    WNL  Orientation:  oriented to person, place, time/date, situation, day of week, month of year and year  Attention:  Good  Concentration:  Good  Memory:  WNL  Fund of knowledge:   Good  Insight:    Good  Judgment:   Good  Impulse  Control:  Good   Risk Assessment: Danger to Self:  No Self-injurious Behavior: No Danger to Others: No Duty to Warn:no Physical Aggression / Violence:No  Access to Firearms a concern: No  Gang Involvement:No   Subjective:   Patient shares the symptoms listed above remain accurate, however some improvement in his anger and in communication especially with wife.  The improvement in his anger has more to do with his awareness of it versus doing something different to help manage it.  We worked primarily on his anger today, looking at strategies he can use to better manage it.  Focus first on his "I can't" statements and changing them to "I can and I am" statements.  From there talked about ways he can interrupt his typical ways of quickly reacting to things with anger, sometimes with greater intensity. Intentionally spent more time focusing on his areas of need rather that as much of what is going on with them as a couple.  They have had another marital session within past 2 weeks.  It seems patient's individual issues are heavily contributing to the couple's distress.  Strategies discussed today can help him respond in calmer and healthier ways to his wife and also to people in work situations where his anger was an issue.   Denies any SI or HI. Reviewed goals and progress/need areas noted in addition to healthy self-care habits.     Interventions: Solution-Oriented/Positive Psychology and Ego-Supportive    Diagnosis:   ICD-10-CM   1. Major depressive disorder, recurrent episode, moderate ([facility])  F33.1  Plan:   Patient to follow through on self-care and strategies noted above in working on his anger, using techniques we spoke about today. Working to not have an "instant negative reaction".  Looked at self control and how to strengthen that and be able to modulate his emotions some.  This will also help his communication with wife and others, and help him to be better understood and heard.   Next appt in 2 wks.  [name], LCSW

## 2019-02-20 DIAGNOSIS — G4733 Obstructive sleep apnea (adult) (pediatric): Secondary | ICD-10-CM | POA: Diagnosis not present

## 2019-02-27 ENCOUNTER — Encounter: Payer: Self-pay | Admitting: Family Medicine

## 2019-02-27 ENCOUNTER — Ambulatory Visit (INDEPENDENT_AMBULATORY_CARE_PROVIDER_SITE_OTHER): Payer: BLUE CROSS/BLUE SHIELD | Admitting: Family Medicine

## 2019-02-27 VITALS — Ht 67.5 in | Wt 172.0 lb

## 2019-02-27 DIAGNOSIS — F1010 Alcohol abuse, uncomplicated: Secondary | ICD-10-CM

## 2019-02-27 DIAGNOSIS — R7989 Other specified abnormal findings of blood chemistry: Secondary | ICD-10-CM

## 2019-02-27 DIAGNOSIS — F321 Major depressive disorder, single episode, moderate: Secondary | ICD-10-CM

## 2019-02-27 DIAGNOSIS — F410 Panic disorder [episodic paroxysmal anxiety] without agoraphobia: Secondary | ICD-10-CM

## 2019-02-27 DIAGNOSIS — R945 Abnormal results of liver function studies: Secondary | ICD-10-CM

## 2019-02-27 NOTE — Progress Notes (Signed)
Virtual Visit via Video   Due to the COVID-19 pandemic, this visit was completed with telemedicine (audio/video) technology to reduce patient and provider exposure as well as to preserve personal protective equipment.   I connected with Shane [name] by a video enabled telemedicine application and verified that I am speaking with the correct person using two identifiers. Location patient: Home Location provider: [facility], Office Persons participating in the virtual visit: Shane [name], Ruf[place], DO   I discussed the limitations of evaluation and management by telemedicine and the availability of in person appointments. The patient expressed understanding and agreed to proceed.  Care Team   Patient Care Team: Briscoe Deutscher, DO as PCP - General (Family Medicine) Lorretta Harp, MD as Consulting Physician (Cardiology) Shane Pedro, MD as Consulting Physician (Ophthalmology) Shane Olp Rudell Cobb, MD as Consulting Physician (Oncology)  Subjective:   HPI:   Patient Active Problem List   Diagnosis Date Noted  . Sleep-disordered breathing 09/07/2018  . Anxiety attack 05/25/2018  . Depression, major, single episode, in partial remission ([place]) 05/25/2018  . S/P CABG (coronary artery bypass graft) 11/16/2017  . Insulin resistance 11/16/2017  . Subclinical hypothyroidism 11/16/2017  . Macular degeneration   . PVC's (premature ventricular contractions) 09/11/2014  . CAD (coronary artery disease) of artery bypass graft 05/24/2014  . Hyperlipidemia 05/24/2014  . Hodgkin disease ([place]) 07/13/1995    Social History   Tobacco Use  . Smoking status: Never Smoker  . Smokeless tobacco: Former Systems developer  . Tobacco comment: a long time ago  Substance Use Topics  . Alcohol use: Yes    Alcohol/week: 3.0 standard drinks    Types: 3 Cans of beer per week    Comment: sometimes 3 beers daily   Current Outpatient Medications:  .  aspirin 81 MG tablet, Take 81 mg by mouth  daily., Disp: , Rfl:  .  BESIVANCE 0.6 % SUSP, as needed. , Disp: , Rfl: 1 .  carvedilol (COREG) 6.25 MG tablet, Take 1 tablet by mouth twice daily., Disp: 180 tablet, Rfl: 0 .  escitalopram (LEXAPRO) 10 MG tablet, Take 1 tablet (10 mg total) by mouth daily., Disp: 90 tablet, Rfl: 0 .  ibuprofen (ADVIL,MOTRIN) 200 MG tablet, Take 200 mg by mouth every 6 (six) hours as needed., Disp: , Rfl:  .  LORazepam (ATIVAN) 0.5 MG tablet, Take 1 tablet (0.5 mg total) by mouth 2 (two) times daily as needed for anxiety., Disp: 15 tablet, Rfl: 1 .  metFORMIN (GLUCOPHAGE XR) 750 MG 24 hr tablet, Take 1 tablet (750 mg total) by mouth daily with breakfast., Disp: 90 tablet, Rfl: 3 .  nitroGLYCERIN (NITROSTAT) 0.4 MG SL tablet, Place 1 tablet (0.4 mg total) under the tongue every 5 (five) minutes as needed for chest pain., Disp: 25 tablet, Rfl: 6 .  NONFORMULARY OR COMPOUNDED ITEM, Shots for macular degeneration left eye, Disp: , Rfl:  .  rosuvastatin (CRESTOR) 20 MG tablet, Take 1 tablet (20 mg total) by mouth daily., Disp: 90 tablet, Rfl: 3  No Known Allergies  Objective:   VITALS: Per patient if applicable, see vitals. GENERAL: Alert, appears well and in no acute distress. HEENT: Atraumatic, conjunctiva clear, no obvious abnormalities on inspection of external nose and ears. NECK: Normal movements of the head and neck. CARDIOPULMONARY: No increased WOB. Speaking in clear sentences. I:E ratio WNL.  MS: Moves all visible extremities without noticeable abnormality. PSYCH: Pleasant and cooperative, well-groomed. Speech normal rate and rhythm. Affect is appropriate.  Insight and judgement are appropriate. Attention is focused, linear, and appropriate.  NEURO: CN grossly intact. Oriented as arrived to appointment on time with no prompting. Moves both UE equally.  SKIN: No obvious lesions, wounds, erythema, or cyanosis noted on face or hands.  Depression screen [facility] 2/9 02/27/2019 11/29/2018 09/05/2018  Decreased  Interest 1 0 0  Down, Depressed, Hopeless 1 0 0  PHQ - 2 Score 2 0 0  Altered sleeping 0 0 1  Tired, decreased energy 0 0 0  Change in appetite 0 0 0  Feeling bad or failure about yourself  1 0 0  Trouble concentrating 0 0 0  Moving slowly or fidgety/restless 1 0 1  Suicidal thoughts 0 0 0  PHQ-9 Score 4 0 2  Difficult doing work/chores Somewhat difficult Not difficult at all Not difficult at all   Assessment and Plan:   Shane [name] was seen today for follow-up.  Diagnoses and all orders for this visit:  Elevated LFTs Comments: Reviewed labs. Encouraged patient to decrease ETOH by half. He will work on this for the next month.  Depression, major, single episode, moderate ([place]) Comments: In therapy. Still struggling with anger, anxiety. Increase Lexapro to 20 mg daily.   Anxiety attack  Alcohol use disorder, mild, abuse    . COVID-19 Education: The signs and symptoms of COVID-19 were discussed with the patient and how to seek care for testing if needed. The importance of social distancing was discussed today. . Reviewed expectations re: course of current medical issues. . Discussed self-management of symptoms. . Outlined signs and symptoms indicating need for more acute intervention. . Patient verbalized understanding and all questions were answered. Marland Kitchen Health Maintenance issues including appropriate healthy diet, exercise, and smoking avoidance were discussed with patient. . See orders for this visit as documented in the electronic medical record.  Briscoe Deutscher, DO  Records requested if needed. Time spent: 25 minutes, of which >50% was spent in obtaining information about his symptoms, reviewing his previous labs, evaluations, and treatments, counseling him about his condition (please see the discussed topics above), and developing a plan to further investigate it; he had a number of questions which I addressed.

## 2019-02-28 ENCOUNTER — Other Ambulatory Visit: Payer: Self-pay

## 2019-02-28 ENCOUNTER — Ambulatory Visit (INDEPENDENT_AMBULATORY_CARE_PROVIDER_SITE_OTHER): Payer: BC Managed Care – PPO | Admitting: Psychiatry

## 2019-02-28 ENCOUNTER — Ambulatory Visit: Payer: BC Managed Care – PPO | Admitting: Psychiatry

## 2019-02-28 ENCOUNTER — Encounter: Payer: Self-pay | Admitting: Family Medicine

## 2019-02-28 DIAGNOSIS — F331 Major depressive disorder, recurrent, moderate: Secondary | ICD-10-CM | POA: Diagnosis not present

## 2019-02-28 NOTE — Progress Notes (Signed)
[facility] Counselor/Therapist Progress Note  Patient ID: Shane [name], MRN: [id number],    Date: 02/28/2019  Time Spent: 60 minutes    10:00am to 11:00am  Virtual Visit Note Connected with patient by a video enabled telemedicine/telehealth application or telephone, with their informed consent, and verified patient privacy and that I am speaking with the correct person using two identifiers. I discussed the limitations, risks, security and privacy concerns of performing psychotherapy and management service by telephone and the availability of in person appointments. I also discussed with the patient that there may be a patient responsible charge related to this service. The patient expressed understanding and agreed to proceed. I discussed the treatment planning with the patient. The patient was provided an opportunity to ask questions and all were answered. The patient agreed with the plan and demonstrated an understanding of the instructions. The patient was advised to call  our office if  symptoms worsen or feel they are in a crisis state and need immediate contact.   Therapist Location: [facility] Patient Location: home   Treatment Type: Individual Therapy  Reported Symptoms:  Anger, Anxiety, depressed mood at times, dealing with uncertainty, some difficulty in communicating with wife,  frustration  Mental Status Exam:  Appearance:    n/a   (teletherapy)  Behavior:  Appropriate and Sharing  Motor:   n/a  (teletherapy)  Speech/Language:   Normal Rate  Affect:   n/a   (teletherapy)  Mood:  anxious, anger, frustrated; some depressed mood  Thought process:  normal  Thought content:    WNL  Sensory/Perceptual disturbances:    WNL  Orientation:  oriented to person, place, time/date, situation, day of week, month of year and year  Attention:  Good  Concentration:  Good  Memory:  WNL  Fund of knowledge:   Good  Insight:    Good  Judgment:   Good  Impulse  Control:  Good   Risk Assessment: Danger to Self:  No Self-injurious Behavior: No Danger to Others: No Duty to Warn:no Physical Aggression / Violence:No  Access to Firearms a concern: No  Gang Involvement:No   Subjective:  Patient shares the symptoms listed above remain accurate, and is noticing more improvement in his anger and in communication especially with wife.  Becoming more aware of the issues affecting him and his wife and they've begun to work together better at some changes and re-arranges.  The improvement in his anger has more to do with his awareness of it versus doing something different to help manage it.    We worked primarily on his anger and communication today, looking at strategies he can use to better manage it.  Focus first on his "I can't" statements and changing them to "I can and I am" statements.  From there talked about ways he can interrupt his typical ways of quickly reacting to things with anger, sometimes with greater intensity.  Discussed better listening strategies and gave examples in session.     They have another marital session coming up within the week.   It seems patient and wife's individual issues are heavily contributing to the couple's distress and this has been shared in their marital therapy.  Strategies discussed today can help him respond in calmer and healthier ways to his wife and also to people in work situations where his anger is an issue.   Denies any SI or HI. Reviewed goals and progress/need areas noted in addition to healthy self-care habits.  Interventions: Solution-Oriented/Positive Psychology and Ego-Supportive    Diagnosis:   ICD-10-CM   1. Major depressive disorder, recurrent episode, moderate ([place])  F33.1     Plan:   Patient to follow through on self-care and strategies noted above in working on his anger, using techniques we spoke about today. Working to not have an "instant negative reaction".  Looked at self control and  how to strengthen that and be able to modulate his emotions some.  This will also help his communication with wife and others, and help him to be better understood and heard.  Next appt in 2 wks.  [name], LCSW

## 2019-03-01 ENCOUNTER — Other Ambulatory Visit: Payer: Self-pay

## 2019-03-01 ENCOUNTER — Ambulatory Visit (INDEPENDENT_AMBULATORY_CARE_PROVIDER_SITE_OTHER): Payer: BC Managed Care – PPO | Admitting: Adult Health

## 2019-03-01 ENCOUNTER — Encounter: Payer: Self-pay | Admitting: Adult Health

## 2019-03-01 VITALS — BP 124/86 | HR 68 | Temp 96.6°F | Ht 67.5 in | Wt 177.2 lb

## 2019-03-01 DIAGNOSIS — G4733 Obstructive sleep apnea (adult) (pediatric): Secondary | ICD-10-CM

## 2019-03-01 DIAGNOSIS — Z9989 Dependence on other enabling machines and devices: Secondary | ICD-10-CM

## 2019-03-01 DIAGNOSIS — F321 Major depressive disorder, single episode, moderate: Secondary | ICD-10-CM

## 2019-03-01 DIAGNOSIS — F410 Panic disorder [episodic paroxysmal anxiety] without agoraphobia: Secondary | ICD-10-CM

## 2019-03-01 MED ORDER — LORAZEPAM 0.5 MG PO TABS: 0.5000 mg | ORAL_TABLET | Freq: Two times a day (BID) | ORAL | 1 refills | Status: DC | PRN

## 2019-03-01 MED ORDER — ESCITALOPRAM OXALATE 20 MG PO TABS: 10.0000 mg | ORAL_TABLET | Freq: Every day | ORAL | 2 refills | Status: DC

## 2019-03-01 NOTE — Progress Notes (Signed)
Shane [name]: Shane Shane [name] DOB: [birth year]  REASON FOR VISIT: follow up HISTORY FROM: Shane [name]  HISTORY OF PRESENT ILLNESS: Today 03/01/19 :  Shane Shane [name] is a 53 year old male with a history of obstructive sleep apnea on CPAP.  He returns today for follow-up.  His download indicates that he uses machine 26 out of 30 days for compliance of 87%.  He uses machine greater than 4 hours each night.  On average he uses his machine 7 hours and 15 minutes.  His residual AHI is 5.4 on 6 to 16 cm of water with EPR 3.  His leak in Shane 95th percentile is 29.5 L/min.  Overall he feels that Shane CPAP has been beneficial.  He denies any new issues.  He returns today for an evaluation.   HISTORY (Copied from Dr.Dohmeier's note) 11/07/18: Shane Shane [name] is a 53 y.o. male , seen here as in a referral from Dr. Juleen China for a sleep apnea evaluation.   Chief complaint according to Shane [name] : Shane Shane [name] felt states that his wife complains about his loud snoring gasping, and choking have been concerning.  She also suffers from insomnia and feels that this contributes to her inability to find restful sleep.  Shane Shane [name] himself is under Shane impression that he sleeps rather calmly and has no problems with sleep.    Sleep/ medical history : His divorce failed has a history of anxiety attacks, a remote history of major depression but recently has not scored high on his PHQ 9's.  Insulin resistance hyperglycemia, hypothyroidism, macular degeneration Shane right forearm affecting Shane right eye for which she is treated at [facility] of his injections.  Premature ventricular contractions, coronary artery disease with an artery bypass at a very young age.  He is also a survivor of Hodgkin's lymphoma diagnosed in 1997 and he does have problems with hyperlipidemia which is medically controlled.  I reviewed with parents Shane [name]'s medications and his last physical exam from 05 September 2018 with his primary care  physician.  At Shane time he had a BMI of 26.3 and he reports that his weight has not changed.  Family sleep history: Shane Shane [name]'s mother had COPD was a longtime heavy smoker, suffered a stroke and also counselor is listed as her medical history.  Both patients  were heavy smokers, his father also developed COPD, had heart disease and had a heart valve replacement at age 10.  Shane Shane [name] did not endorse obstructive sleep apnea to be diagnosed in any family member, no sleep problem.   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  He smoked for a brief time while in college over 30 years ago he drinks 3 beers or less per week, he drinks 1 caffeinated coffee in Shane morning otherwise drinks soda but without caffeine.  He is married for 5 years to a Network engineer.  Shane couple has cats that do sleep in Shane same bedroom.  Sleep habits are as follows: Dinnertime for Shane couple is around 7 PM bedtime is between 1030 and 11 PM and Shane Shane [name] is usually prompt sleep which raises Shane N/V of his wife.  His preferred sleep position is supine and he uses one contour pillow, sleeping on a flat bed.  Around 330 to 4 AM he may have a bathroom break but not every night.  He does not recall dreaming and he usually sleeps again until 6:30 AM when his cats wake him.  He does not feel however fully restored and refreshed and he feels often as if he could use another hour of sleep.  Rarely has Shane an opportunity to nap in daytime but if so it is usually on Shane weekends.  These naps may take 45 to 60 minutes.  His wife has stated that his loud snoring and irregular breathing is of great concern to her.  I would like to add that Shane couple usually goes to bed with Shane TV on in Shane background but it is set on a timer for about 30 minutes after that.  Shane bedroom is cool, quiet and dark.  Shane Shane [name] is asleep his wife is awake for many hours.   REVIEW OF SYSTEMS: Out of a  complete 14 system review of symptoms, Shane Shane [name] complains only of Shane following symptoms, and all other reviewed systems are negative.  See HPI  ALLERGIES: No Known Allergies  HOME MEDICATIONS: Outpatient Medications Prior to Visit  Medication Sig Dispense Refill   aspirin 81 MG tablet Take 81 mg by mouth daily.     BESIVANCE 0.6 % SUSP as needed.   1   carvedilol (COREG) 6.25 MG tablet Take 1 tablet by mouth twice daily. 180 tablet 0   escitalopram (LEXAPRO) 10 MG tablet Take 1 tablet (10 mg total) by mouth daily. 90 tablet 0   ibuprofen (ADVIL,MOTRIN) 200 MG tablet Take 200 mg by mouth every 6 (six) hours as needed.     LORazepam (ATIVAN) 0.5 MG tablet Take 1 tablet (0.5 mg total) by mouth 2 (two) times daily as needed for anxiety. 15 tablet 1   metFORMIN (GLUCOPHAGE XR) 750 MG 24 hr tablet Take 1 tablet (750 mg total) by mouth daily with breakfast. 90 tablet 3   nitroGLYCERIN (NITROSTAT) 0.4 MG SL tablet Place 1 tablet (0.4 mg total) under Shane tongue every 5 (five) minutes as needed for chest pain. 25 tablet 6   NONFORMULARY OR COMPOUNDED ITEM Shots for macular degeneration left eye     rosuvastatin (CRESTOR) 20 MG tablet Take 1 tablet (20 mg total) by mouth daily. 90 tablet 3   No facility-administered medications prior to visit.     PAST MEDICAL HISTORY: Past Medical History:  Diagnosis Date   CAD (coronary artery disease), s/p stent 2011 and CABG 2015 [facility])    Hodgkin disease [facility]) 1997   Hyperlipidemia    Macular degeneration    PVC's (premature ventricular contractions)     PAST SURGICAL HISTORY: Past Surgical History:  Procedure Laterality Date   CARDIAC CATHETERIZATION  08/2009   PCI -LAD 2.9mmx18mm Xience   CORONARY ARTERY BYPASS GRAFT  02/2014    FAMILY HISTORY: Family History  Problem Relation Age of Onset   Stroke Mother    Cancer Mother    COPD Mother    Heart disease Father    COPD Father    Heart attack Father     Heart Problems Brother        heart valve replaced at 58 years old   Heart Problems Brother        MI at age 39; CABG at age 3   Cancer Maternal Grandmother    Colon cancer Neg Hx     SOCIAL HISTORY: Social History   Socioeconomic History   Marital status: Married    Spouse name: Not on file   Number of children: Not on file   Years of education: Not on file   Highest education level:  Not on file  Occupational History   Not on file  Social Needs   Financial resource strain: Not on file   Food insecurity    Worry: Not on file    Inability: Not on file   Transportation needs    Medical: Not on file    Non-medical: Not on file  Tobacco Use   Smoking status: Never Smoker   Smokeless tobacco: Former Systems developer   Tobacco comment: a long time ago  Substance and Sexual Activity   Alcohol use: Yes    Alcohol/week: 3.0 standard drinks    Types: 3 Cans of beer per week    Comment: sometimes 3 beers daily   Drug use: No   Sexual activity: Not on file    Comment: Not reported  Lifestyle   Physical activity    Days per week: Not on file    Minutes per session: Not on file   Stress: Not on file  Relationships   Social connections    Talks on phone: Not on file    Gets together: Not on file    Attends religious service: Not on file    Active member of club or organization: Not on file    Attends meetings of clubs or organizations: Not on file    Relationship status: Not on file   Intimate partner violence    Fear of current or ex partner: Not on file    Emotionally abused: Not on file    Physically abused: Not on file    Forced sexual activity: Not on file  Other Topics Concern   Not on file  Social History Narrative   Not on file      PHYSICAL EXAM  Vitals:   03/01/19 1012  BP: 124/86  Pulse: 68  Temp: (!) 96.6 F (35.9 C)  TempSrc: Oral  Weight: 177 lb 3.2 oz (80.4 kg)  Height: 5' 7.5" (1.715 m)   Body mass index is 27.34  kg/m.  Generalized: Well developed, in no acute distress  Chest: Lungs clear to auscultation bilaterally  Neurological examination  Mentation: Alert oriented to time, place, history taking. Follows all commands speech and language fluent Cranial nerve II-XII: Pupils were equal round reactive to light. Extraocular movements were full, visual field were full on confrontational test. Head turning and shoulder shrug  were normal and symmetric. Motor: Shane motor testing reveals 5 over 5 strength of all 4 extremities. Good symmetric motor tone is noted throughout.  Sensory: Sensory testing is intact to soft touch on all 4 extremities. No evidence of extinction is noted.  Gait and station: Gait is normal. .     DIAGNOSTIC DATA (LABS, IMAGING, TESTING) - I reviewed Shane [name] records, labs, notes, testing and imaging myself where available.  Lab Results  Component Value Date   WBC 6.4 09/05/2018   HGB 14.3 09/05/2018   HCT 41.4 09/05/2018   MCV 94.8 09/05/2018   PLT 252.0 09/05/2018      Component Value Date/Time   NA 139 11/29/2018 0937   NA 141 12/24/2015 1051   K 4.5 11/29/2018 0937   K 4.6 12/24/2015 1051   CL 105 11/29/2018 0937   CO2 24 11/29/2018 0937   CO2 28 12/24/2015 1051   GLUCOSE 107 (H) 11/29/2018 0937   GLUCOSE 95 12/24/2015 1051   BUN 20 11/29/2018 0937   BUN 14.6 12/24/2015 1051   CREATININE 1.20 11/29/2018 0937   CREATININE 1.1 12/24/2015 1051   CALCIUM 9.1 11/29/2018 0937  CALCIUM 9.8 12/24/2015 1051   PROT 6.8 11/29/2018 0937   PROT 7.6 12/24/2015 1051   ALBUMIN 4.4 11/29/2018 0937   ALBUMIN 4.5 12/24/2015 1051   AST 42 (H) 11/29/2018 0937   AST 25 12/24/2015 1051   ALT 59 (H) 11/29/2018 0937   ALT 34 12/24/2015 1051   ALKPHOS 70 11/29/2018 0937   ALKPHOS 45 12/24/2015 1051   BILITOT 0.5 11/29/2018 0937   BILITOT 0.99 12/24/2015 1051   Lab Results  Component Value Date   CHOL 146 09/05/2018   HDL 55.70 09/05/2018   LDLCALC 61 09/05/2018   TRIG  146.0 09/05/2018   CHOLHDL 3 09/05/2018   Lab Results  Component Value Date   HGBA1C 5.7 09/05/2018   No results found for: VITAMINB12 Lab Results  Component Value Date   TSH 4.47 09/05/2018      ASSESSMENT AND PLAN 53 y.o. year old male  has a past medical history of CAD (coronary artery disease), s/p stent 2011 and CABG 2015 [facility]), Hodgkin disease [facility]) (1997), Hyperlipidemia, Macular degeneration, and PVC's (premature ventricular contractions). here with:  1.  Obstructive sleep apnea on CPAP  Shane Shane [name]'s CPAP download shows excellent compliance and good treatment of his apnea.  He is encouraged to continue using CPAP nightly and greater than 4 hours each night.  He is advised that if his symptoms worsen or he develops new symptoms he should let us know.  He will follow-up in 6 months or sooner if needed    I spent 15 minutes with Shane Shane [name]. 50% of this time was spent reviewing CPAP download   Ward Givens, MSN, NP-C 03/01/2019, 10:28 AM [facility] [address], [place], [place] [address] [phone]

## 2019-03-01 NOTE — Patient Instructions (Signed)
Continue using CPAP nightly and greater than 4 hours each night °If your symptoms worsen or you develop new symptoms please let us know.  ° °

## 2019-03-02 ENCOUNTER — Encounter (INDEPENDENT_AMBULATORY_CARE_PROVIDER_SITE_OTHER): Payer: BC Managed Care – PPO | Admitting: Ophthalmology

## 2019-03-02 ENCOUNTER — Other Ambulatory Visit: Payer: Self-pay

## 2019-03-02 DIAGNOSIS — H353111 Nonexudative age-related macular degeneration, right eye, early dry stage: Secondary | ICD-10-CM | POA: Diagnosis not present

## 2019-03-02 DIAGNOSIS — H43813 Vitreous degeneration, bilateral: Secondary | ICD-10-CM

## 2019-03-02 DIAGNOSIS — H353221 Exudative age-related macular degeneration, left eye, with active choroidal neovascularization: Secondary | ICD-10-CM

## 2019-03-02 DIAGNOSIS — I10 Essential (primary) hypertension: Secondary | ICD-10-CM

## 2019-03-02 DIAGNOSIS — H35033 Hypertensive retinopathy, bilateral: Secondary | ICD-10-CM | POA: Diagnosis not present

## 2019-03-03 ENCOUNTER — Encounter: Payer: Self-pay | Admitting: Family Medicine

## 2019-03-14 ENCOUNTER — Other Ambulatory Visit: Payer: Self-pay

## 2019-03-14 ENCOUNTER — Ambulatory Visit (INDEPENDENT_AMBULATORY_CARE_PROVIDER_SITE_OTHER): Payer: BC Managed Care – PPO | Admitting: Psychiatry

## 2019-03-14 DIAGNOSIS — F330 Major depressive disorder, recurrent, mild: Secondary | ICD-10-CM | POA: Diagnosis not present

## 2019-03-14 DIAGNOSIS — G4733 Obstructive sleep apnea (adult) (pediatric): Secondary | ICD-10-CM | POA: Diagnosis not present

## 2019-03-14 NOTE — Progress Notes (Signed)
[facility] Counselor/Therapist Progress Note  Patient ID: Shane [name], MRN: [id number],    Date: 03/14/2019  Time Spent: 60 minutes   10:00am to 11:00am      Virtual Visit Note Connected with patient by a video enabled telemedicine/telehealth application or telephone, with their informed consent, and verified patient privacy and that I am speaking with the correct person using two identifiers. I discussed the limitations, risks, security and privacy concerns of performing psychotherapy and management service by telephone and the availability of in person appointments. I also discussed with the patient that there may be a patient responsible charge related to this service. The patient expressed understanding and agreed to proceed. I discussed the treatment planning with the patient. The patient was provided an opportunity to ask questions and all were answered. The patient agreed with the plan and demonstrated an understanding of the instructions. The patient was advised to call  our office if  symptoms worsen or feel they are in a crisis state and need immediate contact.   Therapist Location: [facility] Patient Location: home   Treatment Type: Individual Therapy  Reported Symptoms: Anxiety, depression (mild), added work stressors, dealing with uncertainty, decreased alcohol intake, frustrations, some communication issues with wife (is making progress)    Mental Status Exam:  Appearance:    n/a   (teletherapy)  Behavior:  Appropriate and Sharing  Motor:   n/a  (teletherapy)  Speech/Language:   Normal Rate  Affect:   n/a   (teletherapy)  Mood:  anxious, anger, frustrated; some depressed mood  Thought process:  normal  Thought content:    WNL  Sensory/Perceptual disturbances:    WNL  Orientation:  oriented to person, place, time/date, situation, day of week, month of year and year  Attention:  Good  Concentration:  Good  Memory:  WNL  Fund of knowledge:   Good   Insight:    Good  Judgment:   Good  Impulse Control:  Good   Risk Assessment: Danger to Self:  No Self-injurious Behavior: No Danger to Others: No Duty to Warn:no Physical Aggression / Violence:No  Access to Firearms a concern: No  Gang Involvement:No   Subjective:   Patient continues to experience anxiety (heightened some), frustrations, depression (mild), difficulty dealing with uncertainties, and some communication issues with wife although lessened.  He and wife are in marital counseling also, outside of this office, and are making progress. They are spending more time together, communicating better, eating healthier, and are gradually finding a better rhythm together in their relationship. Patient do better in managing his anger more effectively and has cut back on use of alcohol which he notices has really helped.   Interventions: Solution-Oriented/Positive Psychology and Ego-Supportive    Diagnosis:   ICD-10-CM   1. Major depressive disorder, recurrent, mild (HCC)  F33.0     Plan:   Treatment Goal progression: Long Term Goal: 1.Enhance the ability to handle effectively his depression and the full variety of life's anxieties. Strategy: Patient will work to recognize and interrupt triggers to his depression and anxieties so as to better manage them in moving forward in life. (Progressing)  Short Term Goal: 2. Identify an anxiety coping mechanism that has been successful in the past and increase its use. Increase understanding of beliefs and messages that produce anxiety. Strategy: Patient will think of a coping skill that has been successful in the past with anxiety and use it in coping with current anxieties. He will also focus on and be  able to explain in session his understanding of beliefs and messages that produce anxiety. (Progressing)    Next appt in approx 2 weeks.   [name], LCSW

## 2019-03-15 ENCOUNTER — Encounter: Payer: Self-pay | Admitting: Family Medicine

## 2019-03-15 DIAGNOSIS — F410 Panic disorder [episodic paroxysmal anxiety] without agoraphobia: Secondary | ICD-10-CM

## 2019-03-15 DIAGNOSIS — F321 Major depressive disorder, single episode, moderate: Secondary | ICD-10-CM

## 2019-03-15 MED ORDER — ESCITALOPRAM OXALATE 20 MG PO TABS: 20.0000 mg | ORAL_TABLET | Freq: Every day | ORAL | 2 refills | Status: DC

## 2019-03-20 ENCOUNTER — Encounter: Payer: Self-pay | Admitting: Family Medicine

## 2019-03-22 ENCOUNTER — Encounter: Payer: Self-pay | Admitting: Family Medicine

## 2019-03-22 NOTE — Patient Instructions (Signed)
Health Maintenance Due  Topic Date Due  . INFLUENZA VACCINE  02/10/2019    Depression screen [facility] 2/9 02/27/2019 11/29/2018 09/05/2018  Decreased Interest 1 0 0  Down, Depressed, Hopeless 1 0 0  PHQ - 2 Score 2 0 0  Altered sleeping 0 0 1  Tired, decreased energy 0 0 0  Change in appetite 0 0 0  Feeling bad or failure about yourself  1 0 0  Trouble concentrating 0 0 0  Moving slowly or fidgety/restless 1 0 1  Suicidal thoughts 0 0 0  PHQ-9 Score 4 0 2  Difficult doing work/chores Somewhat difficult Not difficult at all Not difficult at all

## 2019-03-22 NOTE — Progress Notes (Signed)
Virtual Visit via Video   Due to the COVID-19 pandemic, this visit was completed with telemedicine (audio/video) technology to reduce patient and provider exposure as well as to preserve personal protective equipment.   I connected with Shane [name] by a video enabled telemedicine application and verified that I am speaking with the correct person using two identifiers. Location patient: Home Location provider: [facility], Office Persons participating in the virtual visit: Shane [name], Shane Deutscher, DO Shane [name], CMA acting as scribe for Dr. Briscoe [name].   I discussed the limitations of evaluation and management by telemedicine and the availability of in person appointments. The patient expressed understanding and agreed to proceed.  Care Team   Patient Care Team: Shane Deutscher, DO as PCP - General (Family Medicine) Lorretta Harp, MD as Consulting Physician (Cardiology) Hayden Pedro, MD as Consulting Physician (Ophthalmology) Marin Olp Rudell Cobb, MD as Consulting Physician (Oncology)  Subjective:   HPI: Struggling with depression, anxiety. Drinking too much. Having a difficult time communicating with his wife. Feels frustrated that she is in major depression episode with lack of motivation. After a therapy appointment last week, he drank heavily, got into an argument with his wife, and left. He drove to [place] to stay with a friend for a few days. He is very upset while telling this to me, states that he really messed up by yelling, leaving, and driving after drinking. He feels that his wife deserves better. No SI. Still doing weekly marriage counseling. He wonders if he has bipolar DO. Feels that medication and increase has been overall helpful and has not caused hypomanic symptoms.   Review of Systems  Constitutional: Negative for chills and fever.  HENT: Negative for hearing loss and tinnitus.   Eyes: Negative for blurred vision and double vision.   Respiratory: Negative for cough and hemoptysis.   Cardiovascular: Negative for chest pain, palpitations and leg swelling.  Gastrointestinal: Negative for heartburn and nausea.  Genitourinary: Negative for dysuria and urgency.  Neurological: Negative for dizziness and headaches.    Patient Active Problem List   Diagnosis Date Noted  . Alcohol use disorder, mild, abuse 03/25/2019  . Sleep-disordered breathing 09/07/2018  . Anxiety attack 05/25/2018  . Depression, major, single episode, in partial remission ([place]) 05/25/2018  . S/P CABG (coronary artery bypass graft) 11/16/2017  . Insulin resistance 11/16/2017  . Subclinical hypothyroidism 11/16/2017  . Macular degeneration   . PVC's (premature ventricular contractions) 09/11/2014  . CAD (coronary artery disease) of artery bypass graft 05/24/2014  . Hyperlipidemia 05/24/2014  . Hodgkin disease ([place]) 07/13/1995    Social History   Tobacco Use  . Smoking status: Never Smoker  . Smokeless tobacco: Former Systems developer  . Tobacco comment: a long time ago  Substance Use Topics  . Alcohol use: Yes    Alcohol/week: 3.0 standard drinks    Types: 3 Cans of beer per week    Comment: sometimes 3 beers daily    Current Outpatient Medications:  .  aspirin 81 MG tablet, Take 81 mg by mouth daily., Disp: , Rfl:  .  BESIVANCE 0.6 % SUSP, as needed. , Disp: , Rfl: 1 .  carvedilol (COREG) 6.25 MG tablet, Take 1 tablet by mouth twice daily., Disp: 180 tablet, Rfl: 0 .  escitalopram (LEXAPRO) 20 MG tablet, Take 1 tablet (20 mg total) by mouth daily., Disp: 30 tablet, Rfl: 2 .  ibuprofen (ADVIL,MOTRIN) 200 MG tablet, Take 200 mg by mouth every 6 (six)  hours as needed., Disp: , Rfl:  .  LORazepam (ATIVAN) 0.5 MG tablet, Take 1 tablet (0.5 mg total) by mouth every 8 (eight) hours as needed for anxiety., Disp: 60 tablet, Rfl: 1 .  metFORMIN (GLUCOPHAGE XR) 750 MG 24 hr tablet, Take 1 tablet (750 mg total) by mouth daily with breakfast., Disp: 90 tablet, Rfl:  3 .  nitroGLYCERIN (NITROSTAT) 0.4 MG SL tablet, Place 1 tablet (0.4 mg total) under the tongue every 5 (five) minutes as needed for chest pain., Disp: 25 tablet, Rfl: 6 .  NONFORMULARY OR COMPOUNDED ITEM, Shots for macular degeneration left eye, Disp: , Rfl:  .  rosuvastatin (CRESTOR) 20 MG tablet, Take 1 tablet (20 mg total) by mouth daily., Disp: 90 tablet, Rfl: 3  No Known Allergies  Objective:   VITALS: Per patient if applicable, see vitals. GENERAL: Alert, appears well and in no acute distress. HEENT: Atraumatic, conjunctiva clear, no obvious abnormalities on inspection of external nose and ears. NECK: Normal movements of the head and neck. CARDIOPULMONARY: No increased WOB. Speaking in clear sentences. I:E ratio WNL.  MS: Moves all visible extremities without noticeable abnormality. PSYCH: Pleasant and cooperative, well-groomed. Speech normal rate and rhythm. Affect is appropriate. Insight and judgement are appropriate. Attention is focused, linear, and appropriate.  NEURO: CN grossly intact. Oriented as arrived to appointment on time with no prompting. Moves both UE equally.  SKIN: No obvious lesions, wounds, erythema, or cyanosis noted on face or hands.  Depression screen [facility] 2/9 02/27/2019 11/29/2018 09/05/2018  Decreased Interest 1 0 0  Down, Depressed, Hopeless 1 0 0  PHQ - 2 Score 2 0 0  Altered sleeping 0 0 1  Tired, decreased energy 0 0 0  Change in appetite 0 0 0  Feeling bad or failure about yourself  1 0 0  Trouble concentrating 0 0 0  Moving slowly or fidgety/restless 1 0 1  Suicidal thoughts 0 0 0  PHQ-9 Score 4 0 2  Difficult doing work/chores Somewhat difficult Not difficult at all Not difficult at all    Assessment and Plan:   [name] was seen today for follow-up.  Diagnoses and all orders for this visit:  Depression, major, single episode, moderate ([place]) Comments: Worsening. Will ask Psychiatry to evaluate. Already in therapy. Orders: -     LORazepam  (ATIVAN) 0.5 MG tablet; Take 1 tablet (0.5 mg total) by mouth every 8 (eight) hours as needed for anxiety. -     Ambulatory referral to Psychiatry  Anxiety attack Comments: Ativan low dose, sparingly.  Orders: -     LORazepam (ATIVAN) 0.5 MG tablet; Take 1 tablet (0.5 mg total) by mouth every 8 (eight) hours as needed for anxiety. -     Ambulatory referral to Psychiatry  Difficulty controlling anger Comments: Questioning SSRI in this patient. He will STOP all alcohol. Okay to take Ativan more reg if needed temporarily. Encouraged communication.   Alcohol use disorder, mild, abuse Comments: Advised to STOP all alcohol.    Marland Kitchen COVID-19 Education: The signs and symptoms of COVID-19 were discussed with the patient and how to seek care for testing if needed. The importance of social distancing was discussed today. . Reviewed expectations re: course of current medical issues. . Discussed self-management of symptoms. . Outlined signs and symptoms indicating need for more acute intervention. . Patient verbalized understanding and all questions were answered. Marland Kitchen Health Maintenance issues including appropriate healthy diet, exercise, and smoking avoidance were discussed with patient. . See orders  for this visit as documented in the electronic medical record.  Shane Deutscher, DO  Records requested if needed. Time spent: 25 minutes, of which >50% was spent in obtaining information about his symptoms, reviewing his previous labs, evaluations, and treatments, counseling him about his condition (please see the discussed topics above), and developing a plan to further investigate it; he had a number of questions which I addressed.

## 2019-03-23 ENCOUNTER — Encounter: Payer: Self-pay | Admitting: Family Medicine

## 2019-03-23 ENCOUNTER — Ambulatory Visit (INDEPENDENT_AMBULATORY_CARE_PROVIDER_SITE_OTHER): Payer: BC Managed Care – PPO | Admitting: Family Medicine

## 2019-03-23 VITALS — Temp 97.8°F | Ht 67.5 in | Wt 170.0 lb

## 2019-03-23 DIAGNOSIS — F1010 Alcohol abuse, uncomplicated: Secondary | ICD-10-CM | POA: Diagnosis not present

## 2019-03-23 DIAGNOSIS — R454 Irritability and anger: Secondary | ICD-10-CM | POA: Diagnosis not present

## 2019-03-23 DIAGNOSIS — F410 Panic disorder [episodic paroxysmal anxiety] without agoraphobia: Secondary | ICD-10-CM | POA: Diagnosis not present

## 2019-03-23 DIAGNOSIS — F321 Major depressive disorder, single episode, moderate: Secondary | ICD-10-CM | POA: Diagnosis not present

## 2019-03-25 ENCOUNTER — Encounter: Payer: Self-pay | Admitting: Family Medicine

## 2019-03-25 DIAGNOSIS — F1010 Alcohol abuse, uncomplicated: Secondary | ICD-10-CM | POA: Insufficient documentation

## 2019-03-25 MED ORDER — LORAZEPAM 0.5 MG PO TABS: 0.5000 mg | ORAL_TABLET | Freq: Three times a day (TID) | ORAL | 1 refills | Status: DC | PRN

## 2019-03-28 ENCOUNTER — Ambulatory Visit (INDEPENDENT_AMBULATORY_CARE_PROVIDER_SITE_OTHER): Payer: BC Managed Care – PPO | Admitting: Psychiatry

## 2019-03-28 ENCOUNTER — Other Ambulatory Visit: Payer: Self-pay

## 2019-03-28 DIAGNOSIS — F330 Major depressive disorder, recurrent, mild: Secondary | ICD-10-CM

## 2019-03-28 NOTE — Progress Notes (Signed)
[facility] Counselor/Therapist Progress Note  Patient ID: Shane [name], MRN: [id number],    Date: 03/28/2019  Time Spent:  60 minutes     10:00am to 11:00am  Virtual Visit  Note Connected with patient by a video enabled telemedicine/telehealth application or telephone, with their informed consent, and verified patient privacy and that I am speaking with the correct person using two identifiers. I discussed the limitations, risks, security and privacy concerns of performing psychotherapy and management service by telephone and the availability of in person appointments. I also discussed with the patient that there may be a patient responsible charge related to this service. The patient expressed understanding and agreed to proceed. I discussed the treatment planning with the patient. The patient was provided an opportunity to ask questions and all were answered. The patient agreed with the plan and demonstrated an understanding of the instructions. The patient was advised to call  our office if  symptoms worsen or feel they are in a crisis state and need immediate contact.   Therapist Location: [facility] Patient Location: home   Treatment Type: Individual Therapy  Reported Symptoms:  "nervous breakdown", anxiety, some depression"  Mental Status Exam:  Appearance:   n/a   telehealth     Behavior:  Sharing  Motor:  n/a   telehealth  Speech/Language:   Clear and Coherent  Affect:  n/a   telehealth  Mood:  some anxiety and depression  Thought process:  normal  Thought content:    WNL  Sensory/Perceptual disturbances:    WNL  Orientation:  oriented to person, place, time/date, situation, day of week, month of year and year  Attention:  Good  Concentration:  Good  Memory:  WNL  Fund of knowledge:   Good  Insight:    Good  Judgment:   Judgment normal Good; was poor recently in an incident that began at home and escalated  Impulse Control:  normally good, but  had incident recently where impulse control was poor   Risk Assessment: Danger to Self:  No Self-injurious Behavior: No Danger to Others: No Duty to Warn:no Physical Aggression / Violence:No  Access to Firearms a concern: No  Gang Involvement:No   Subjective:  Patient reports past couple weeks it has has been difficult between him and wife.  He reports getting very angry and upset due to comment from wife and "just about had a nervous breakdown", ended up leaving home and drove by himself to [place] and returned home a few days later. Is now quarantining at home.  Was quite impulsive, drinking more, driving at accelerated speeds and got 2 speeding tickets within just a few day while traveling to and from [place] where he has a friend. Sounds much more calm today and says that he and wife are being respectful right now an not talking about their issues as a couple nor patient's impulsively driving to [place].  They are waiting to discuss it at their next session with marriage counselor. States things at home right now are calm and respectful. States that he feels calm and denies feeling any excess stress nor has any intention to hurt himself nor hurt nor endanger anyone else.   Interventions: Cognitive Behavioral Therapy and Solution-Oriented/Positive Psychology  Diagnosis:   ICD-10-CM   1. Major depressive disorder, recurrent, mild ([place])  F33.0     Plan:  Patient not signing tx updates on computer screen due to COVID  Treatment Goals:  Long Term Goal: 1.Enhance the  ability to handle effectively his depression and the full variety of life's anxieties.  Strategy: Patient will work to recognize and interrupt triggers to his depression and anxieties so as to better manage them in moving forward in life.  (Progressing) Patient had a stressful week and was not as successful on the goal stated above.  Starting again today, he did share that he working harder on recognizing triggers to his  depression and also to some anger.  Is showing better self-control today than in past few days as documented in progress note.  Short Term Goal: 2. Identify an anxiety coping mechanism that has been successful in the past and increase its use. Increase understanding of beliefs and messages that produce anxiety.  Strategy: Patient will think of a coping skill that has been successful in the past with anxiety and use it in coping with current anxieties. He will also focus on and be able to explain in session his understanding of beliefs and messages that produce anxiety.  (Progressing)  Patient has not made much progress on his short term goal yet. This week was much more stressful than usual.  He will be working to make this happen.  Today we did discuss some about messages and self-talk that lead to anxiety.  May also be adding an additional goal re: managing anger in healthy ways. Will re-assess next session.  Next appt within  2 weeks  [name], LCSW

## 2019-04-03 ENCOUNTER — Ambulatory Visit: Payer: BC Managed Care – PPO | Admitting: Family Medicine

## 2019-04-03 ENCOUNTER — Other Ambulatory Visit: Payer: Self-pay

## 2019-04-03 ENCOUNTER — Encounter: Payer: Self-pay | Admitting: Family Medicine

## 2019-04-03 VITALS — BP 118/64 | HR 63 | Temp 97.2°F | Ht 65.75 in | Wt 172.2 lb

## 2019-04-03 DIAGNOSIS — Z23 Encounter for immunization: Secondary | ICD-10-CM | POA: Diagnosis not present

## 2019-04-03 DIAGNOSIS — F321 Major depressive disorder, single episode, moderate: Secondary | ICD-10-CM | POA: Diagnosis not present

## 2019-04-03 NOTE — Progress Notes (Signed)
Shane [name] is a 53 y.o. male is here for follow up.  History of Present Illness:   Shane [name], CMA acting as scribe for Dr. Briscoe Deutscher.   HPI: Patient in for follow up on increased lexapro. He has had a very bad episode after changing medications. He does not feel any improvement after the increase.  No ETOH. Has appointment with Psychiatry tomorrow. Working with therapist.   Health Maintenance Due  Topic Date Due  . INFLUENZA VACCINE  02/10/2019   Depression screen [facility] 2/9 04/03/2019 02/27/2019 11/29/2018  Decreased Interest 0 1 0  Down, Depressed, Hopeless 1 1 0  PHQ - 2 Score 1 2 0  Altered sleeping 0 0 0  Tired, decreased energy 1 0 0  Change in appetite 1 0 0  Feeling bad or failure about yourself  1 1 0  Trouble concentrating 0 0 0  Moving slowly or fidgety/restless 0 1 0  Suicidal thoughts 0 0 0  PHQ-9 Score 4 4 0  Difficult doing work/chores Not difficult at all Somewhat difficult Not difficult at all   PMHx, SurgHx, SocialHx, FamHx, Medications, and Allergies were reviewed in the Visit Navigator and updated as appropriate.   Patient Active Problem List   Diagnosis Date Noted  . Alcohol use disorder, mild, abuse 03/25/2019  . Sleep-disordered breathing 09/07/2018  . Anxiety attack 05/25/2018  . Depression, major, single episode, in partial remission ([place]) 05/25/2018  . S/P CABG (coronary artery bypass graft) 11/16/2017  . Insulin resistance 11/16/2017  . Subclinical hypothyroidism 11/16/2017  . Macular degeneration   . PVC's (premature ventricular contractions) 09/11/2014  . CAD (coronary artery disease) of artery bypass graft 05/24/2014  . Hyperlipidemia 05/24/2014  . Hodgkin disease ([place]) 07/13/1995   Social History   Tobacco Use  . Smoking status: Never Smoker  . Smokeless tobacco: Former Systems developer  . Tobacco comment: a long time ago  Substance Use Topics  . Alcohol use: Yes    Alcohol/week: 3.0 standard drinks    Types: 3 Cans of beer per  week    Comment: sometimes 3 beers daily  . Drug use: No   Current Medications and Allergies   Current Outpatient Medications:  .  aspirin 81 MG tablet, Take 81 mg by mouth daily., Disp: , Rfl:  .  BESIVANCE 0.6 % SUSP, as needed. , Disp: , Rfl: 1 .  carvedilol (COREG) 6.25 MG tablet, Take 1 tablet by mouth twice daily., Disp: 180 tablet, Rfl: 0 .  escitalopram (LEXAPRO) 20 MG tablet, Take 1 tablet (20 mg total) by mouth daily., Disp: 30 tablet, Rfl: 2 .  ibuprofen (ADVIL,MOTRIN) 200 MG tablet, Take 200 mg by mouth every 6 (six) hours as needed., Disp: , Rfl:  .  LORazepam (ATIVAN) 0.5 MG tablet, Take 1 tablet (0.5 mg total) by mouth every 8 (eight) hours as needed for anxiety., Disp: 60 tablet, Rfl: 1 .  metFORMIN (GLUCOPHAGE XR) 750 MG 24 hr tablet, Take 1 tablet (750 mg total) by mouth daily with breakfast., Disp: 90 tablet, Rfl: 3 .  nitroGLYCERIN (NITROSTAT) 0.4 MG SL tablet, Place 1 tablet (0.4 mg total) under the tongue every 5 (five) minutes as needed for chest pain., Disp: 25 tablet, Rfl: 6 .  NONFORMULARY OR COMPOUNDED ITEM, Shots for macular degeneration left eye, Disp: , Rfl:  .  rosuvastatin (CRESTOR) 20 MG tablet, Take 1 tablet (20 mg total) by mouth daily., Disp: 90 tablet, Rfl: 3  No Known Allergies Review of Systems  Pertinent items are noted in the HPI. Otherwise, a complete ROS is negative.  Vitals   Vitals:   04/03/19 0840  BP: 118/64  Pulse: 63  Temp: (!) 97.2 F (36.2 C)  TempSrc: Temporal  SpO2: 99%  Weight: 172 lb 3.2 oz (78.1 kg)  Height: 5' 5.75" (1.67 m)     Body mass index is 28.01 kg/m.  Physical Exam   Physical Exam Constitutional:      General: He is not in acute distress.    Appearance: He is well-developed. He is not diaphoretic.  HENT:     Head: Normocephalic and atraumatic.     Right Ear: External ear normal.     Left Ear: External ear normal.     Nose: Nose normal.  Eyes:     Conjunctiva/sclera: Conjunctivae normal.  Neck:      Musculoskeletal: Normal range of motion.  Cardiovascular:     Rate and Rhythm: Normal rate and regular rhythm.  Pulmonary:     Effort: Pulmonary effort is normal. No respiratory distress.  Abdominal:     General: Bowel sounds are normal.     Palpations: Abdomen is soft.     Tenderness: There is no abdominal tenderness.  Musculoskeletal: Normal range of motion.        General: No deformity.  Skin:    General: Skin is warm.  Neurological:     Mental Status: He is alert and oriented to person, place, and time.  Psychiatric:        Behavior: Behavior normal.    Assessment and Plan   [name] was seen today for follow-up.  Diagnoses and all orders for this visit:  Depression, major, single episode, moderate ([place]) Comments: Congratulated on stopping ETOH. Working with therapist. Will see Psych tomorrow.   Need for immunization against influenza -     Flu Vaccine QUAD 36+ mos IM   . Orders and follow up as documented in [place], reviewed diet, exercise and weight control, cardiovascular risk and specific lipid/LDL goals reviewed, reviewed medications and side effects in detail.  . Reviewed expectations re: course of current medical issues. . Outlined signs and symptoms indicating need for more acute intervention. . Patient verbalized understanding and all questions were answered. . Patient received an After Visit Summary.  CMA served as Education administrator during this visit. History, Physical, and Plan performed by medical provider. The above documentation has been reviewed and is accurate and complete. Briscoe Deutscher, D.O.  Briscoe Deutscher, DO [place], [facility] 04/03/2019

## 2019-04-03 NOTE — Patient Instructions (Signed)

## 2019-04-04 ENCOUNTER — Ambulatory Visit (INDEPENDENT_AMBULATORY_CARE_PROVIDER_SITE_OTHER): Payer: BC Managed Care – PPO | Admitting: Adult Health

## 2019-04-04 ENCOUNTER — Encounter: Payer: Self-pay | Admitting: Adult Health

## 2019-04-04 VITALS — BP 128/88 | HR 69 | Ht 67.0 in | Wt 168.0 lb

## 2019-04-04 DIAGNOSIS — F39 Unspecified mood [affective] disorder: Secondary | ICD-10-CM

## 2019-04-04 DIAGNOSIS — F411 Generalized anxiety disorder: Secondary | ICD-10-CM | POA: Diagnosis not present

## 2019-04-04 DIAGNOSIS — F331 Major depressive disorder, recurrent, moderate: Secondary | ICD-10-CM

## 2019-04-04 MED ORDER — ARIPIPRAZOLE 5 MG PO TABS: ORAL_TABLET | ORAL | 2 refills | Status: DC

## 2019-04-04 NOTE — Progress Notes (Signed)
[facility] MD/PA/NP Initial Note  04/04/2019 10:58 AM Shane [name]  MRN:  [id number]  Chief Complaint:  Chief Complaint    Anxiety; Depression      HPI:  Describes mood today as "ok". Pleasant. Mood symptoms - reports depression, anxiety, and irritability. Married 3 years. Marital issues - "pressures with wife increasing" - wife has bipolar. Wife with 3 head traumas - concussions. Finds himself "flying off the handle" more than he wants too. Feels like she is "jekyl and hyde". Notes she is "negative". Feels like he has tried to "make progress with the situation". Has been "acting in ways he normally woud not". Has stopped drinking recently - 2 weeks ago - had been drinking 4 to 6 alcohol drinks daily for several years. Stating "I have to stop and get my shit together". Got drunk over labor day wekend and drove to [place] for the weekend. Didn't feel like he was "getting any help or support". Stating "I just snapped". Went to see best friend - daughter's wedding. Got 2 speeding tickets. Was driving reckless. Stating "I should have been dead". Wife concerned he may have Bipolar disorder. Anger levels are "way too high". Stating "I don't know how to manage it". Sees individual and couples therapist. Stable interest and motivation. Taking medications as prescribed.  Energy levels stable. Active, does not have a regular exercise routine. Works full-time in Engineer, technical sales. Enjoys some usual interests and activities. Married. Lives with wife of 3 years. Has 2 sons that live in [place]. Other family in [place].  Appetite adequate. Weight loss 5 to 10 pounds. Sleeps well most nights. Averages 8 hours.Using CPAP machine x 3 months. Focus and concentration stable. Completing tasks. Managing aspects of household.  Denies SI or HI. Denies AH or VH.  Visit Diagnosis:    ICD-10-CM   1. Generalized anxiety disorder  F41.1 ARIPiprazole (ABILIFY) 5 MG tablet  2. Major depressive disorder, recurrent episode, moderate ([facility])   F33.1 ARIPiprazole (ABILIFY) 5 MG tablet  3. Episodic mood disorder ([facility])  F39 ARIPiprazole (ABILIFY) 5 MG tablet    Past Psychiatric History: Denies  Past Medical History:  Past Medical History:  Diagnosis Date  . CAD (coronary artery disease), s/p stent 2011 and CABG 2015 [facility])   . Hodgkin disease ([place]) 1997  . Hyperlipidemia   . Macular degeneration   . PVC's (premature ventricular contractions)     Past Surgical History:  Procedure Laterality Date  . CARDIAC CATHETERIZATION  08/2009   PCI -LAD 2.22mmx18mm Xience  . CORONARY ARTERY BYPASS GRAFT  02/2014    Family Psychiatric History: Denies  Family History:  Family History  Problem Relation Age of Onset  . Stroke Mother   . Cancer Mother   . COPD Mother   . Heart disease Father   . COPD Father   . Heart attack Father   . Heart Problems Brother        heart valve replaced at 83 years old  . Heart Problems Brother        MI at age 3; CABG at age 10  . Cancer Maternal Grandmother   . Colon cancer Neg Hx     Social History:  Social History   Socioeconomic History  . Marital status: Married    Spouse name: Not on file  . Number of children: Not on file  . Years of education: Not on file  . Highest education level: Not on file  Occupational History  . Not on file  Social Needs  . Financial resource strain: Not on file  . Food insecurity    Worry: Not on file    Inability: Not on file  . Transportation needs    Medical: Not on file    Non-medical: Not on file  Tobacco Use  . Smoking status: Never Smoker  . Smokeless tobacco: Former Systems developer  . Tobacco comment: a long time ago  Substance and Sexual Activity  . Alcohol use: Yes    Alcohol/week: 3.0 standard drinks    Types: 3 Cans of beer per week    Comment: sometimes 3 beers daily  . Drug use: No  . Sexual activity: Not on file    Comment: Not reported  Lifestyle  . Physical activity    Days per week: Not on file    Minutes per  session: Not on file  . Stress: Not on file  Relationships  . Social Herbalist on phone: Not on file    Gets together: Not on file    Attends religious service: Not on file    Active member of club or organization: Not on file    Attends meetings of clubs or organizations: Not on file    Relationship status: Not on file  Other Topics Concern  . Not on file  Social History Narrative  . Not on file    Allergies: No Known Allergies  Metabolic Disorder Labs: Lab Results  Component Value Date   HGBA1C 5.7 09/05/2018   No results found for: PROLACTIN Lab Results  Component Value Date   CHOL 146 09/05/2018   TRIG 146.0 09/05/2018   HDL 55.70 09/05/2018   CHOLHDL 3 09/05/2018   VLDL 29.2 09/05/2018   LDLCALC 61 09/05/2018   LDLCALC 69 11/16/2017   Lab Results  Component Value Date   TSH 4.47 09/05/2018   TSH 4.58 (H) 05/24/2018    Therapeutic Level Labs: No results found for: LITHIUM No results found for: VALPROATE No components found for:  CBMZ  Current Medications: Current Outpatient Medications  Medication Sig Dispense Refill  . ARIPiprazole (ABILIFY) 5 MG tablet Take 1/2 tablet daily x 7 days, then one tablet daily. 30 tablet 2  . aspirin 81 MG tablet Take 81 mg by mouth daily.    Marland Kitchen BESIVANCE 0.6 % SUSP as needed.   1  . carvedilol (COREG) 6.25 MG tablet Take 1 tablet by mouth twice daily. 180 tablet 0  . escitalopram (LEXAPRO) 20 MG tablet Take 1 tablet (20 mg total) by mouth daily. 30 tablet 2  . ibuprofen (ADVIL,MOTRIN) 200 MG tablet Take 200 mg by mouth every 6 (six) hours as needed.    Marland Kitchen LORazepam (ATIVAN) 0.5 MG tablet Take 1 tablet (0.5 mg total) by mouth every 8 (eight) hours as needed for anxiety. 60 tablet 1  . metFORMIN (GLUCOPHAGE XR) 750 MG 24 hr tablet Take 1 tablet (750 mg total) by mouth daily with breakfast. 90 tablet 3  . nitroGLYCERIN (NITROSTAT) 0.4 MG SL tablet Place 1 tablet (0.4 mg total) under the tongue every 5 (five) minutes as  needed for chest pain. 25 tablet 6  . NONFORMULARY OR COMPOUNDED ITEM Shots for macular degeneration left eye    . rosuvastatin (CRESTOR) 20 MG tablet Take 1 tablet (20 mg total) by mouth daily. 90 tablet 3   No current facility-administered medications for this visit.     Medication Side Effects: none  Orders placed this visit:  No orders of the defined  types were placed in this encounter.   Psychiatric Specialty Exam:  Review of Systems  Psychiatric/Behavioral: Positive for depression and substance abuse. The patient is nervous/anxious.   All other systems reviewed and are negative.   Blood pressure 128/88, pulse 69, height 5\' 7"  (1.702 m), weight 168 lb (76.2 kg).Body mass index is 26.31 kg/m.  General Appearance: Neat and Well Groomed  Eye Contact:  Good  Speech:  Normal Rate  Volume:  Normal  Mood:  Anxious, Depressed and Irritable  Affect:  Appropriate  Thought Process:  Coherent  Orientation:  Full (Time, Place, and Person)  Thought Content: Logical   Suicidal Thoughts:  No  Homicidal Thoughts:  No  Memory:  WNL  Judgement:  Good  Insight:  Good  Psychomotor Activity:  Normal  Concentration:  Concentration: Good  Recall:  NA  Fund of Knowledge: Good  Language: Good  Assets:  Communication Skills Desire for Improvement Financial Resources/Insurance Housing Intimacy Leisure Time Physical Health Resilience Social Support Talents/Skills Transportation Vocational/Educational  ADL's:  Intact  Cognition: WNL  Prognosis:  Good   Screenings:  GAD-7     Office Visit from 05/24/2018 in [place] Visit from 04/03/2018 in [place]  Total GAD-7 Score  2  9    PHQ2-9     Office Visit from 04/03/2019 in [place] from 02/27/2019 in [facility] from 11/29/2018 in [place] from 09/05/2018 in [place] from 05/24/2018 in [place]  PHQ-2 Total Score  1  2  0  0  0  PHQ-9 Total Score  4  4  0  2  1     Receiving Psychotherapy: Yes Rinaldo Cloud  Treatment Plan/Recommendations:   Plan:  1. Continue Lexapro 20mg  daily 2. Continue Ativan 0.5mg  BID 3. Add Abilify 5mg  - take one tablet x 7 days, then one tablet daily.  Continue therapy with Rinaldo Cloud.   RTC 4 weeks  Patient advised to contact office with any questions, adverse effects, or acute worsening in signs and symptoms.  Discussed potential benefits, risk, and side effects of benzodiazepines to include potential risk of tolerance and dependence, as well as possible drowsiness.  Advised patient not to drive if experiencing drowsiness and to take lowest possible effective dose to minimize risk of dependence and tolerance.   Discussed potential metabolic side effects associated with atypical antipsychotics, as well as potential risk for movement side effects. Advised pt to contact office if movement side effects occur.   Aloha Gell, NP

## 2019-04-11 ENCOUNTER — Ambulatory Visit (INDEPENDENT_AMBULATORY_CARE_PROVIDER_SITE_OTHER): Payer: BC Managed Care – PPO | Admitting: Psychiatry

## 2019-04-11 ENCOUNTER — Other Ambulatory Visit: Payer: Self-pay

## 2019-04-11 DIAGNOSIS — F411 Generalized anxiety disorder: Secondary | ICD-10-CM | POA: Diagnosis not present

## 2019-04-11 NOTE — Progress Notes (Signed)
[facility] Counselor/Therapist Progress Note  Patient ID: Shane [name], MRN: [id number],    Date: 04/11/2019  Time Spent: 60 minutes    10:00am to 11:00am  Treatment Type: Individual Therapy  Virtual Visit Video Note Connected with patient by a video enabled telemedicine/telehealth application or telephone, with their informed consent, and verified patient privacy and that I am speaking with the correct person using two identifiers. I discussed the limitations, risks, security and privacy concerns of performing psychotherapy and management service by telephone and the availability of in person appointments. I also discussed with the patient that there may be a patient responsible charge related to this service. The patient expressed understanding and agreed to proceed. I discussed the treatment planning with the patient. The patient was provided an opportunity to ask questions and all were answered. The patient agreed with the plan and demonstrated an understanding of the instructions. The patient was advised to call  our office if  symptoms worsen or feel they are in a crisis state and need immediate contact.   Therapist Location: [facility] Patient Location: home  Reported Symptoms: Anxiety, depression, anger (some better)    Mental Status Exam:  Appearance:   casual  Behavior:  Appropriate and Sharing  Motor:  Normal  Speech/Language:   Normal Rate  Affect:  Depressed and anxious  Mood:  anxious and depressed  Thought process:  normal  Thought content:    WNL  Sensory/Perceptual disturbances:    WNL  Orientation:  oriented to person, place, time/date, situation, day of week, month of year and year  Attention:  Good  Concentration:  Good  Memory:  WNL  Fund of knowledge:   Good  Insight:    Good  Judgment:   Good  Impulse Control:  Good   Risk Assessment: Danger to Self:  No Self-injurious Behavior: No Danger to Others: No Duty to  Warn:no Physical Aggression / Violence:No  Access to Firearms a concern: No  Gang Involvement:No   Subjective:  Patient reports some improvement.  Anger has decreased some and anger management has improved some.  Communication between he and wife is some better, and are recognizing when they "are off track".  Trying to have more and better couple time together with wife.  Getting outside more.  Wife seems to be trying to trust him more.     Interventions: Cognitive Behavioral Therapy and Solution-Oriented/Positive Psychology  Diagnosis:   ICD-10-CM   1. Generalized anxiety disorder  F41.1      Plan:  Patient not signing tx updates on computer screen due to COVID  Treatment Goals: Goals remain on tx plan while patient works on strategies to meet his goals.  Long Term Goal: 1.Enhance the ability to handle effectively his depression and the full variety of life's anxieties.  Strategy: Patient will work to recognize and interrupt triggers to his depression and anxieties so as to better manage them in moving forward in life.  (Progressing) Patient progressing in working on better managing his anxiety especially.  Had med eval and is now taking Abilify.  Has stopped using alcohol at all and that has helped.  Outlook is some better.  Recognizing triggers to anxiety or depression sometimes and when this happens, he "gets up and takes some deep breaths or goes outside to walk around and think."  He feels he has not made more gains in his self control overall, but also feels he has held onto prior progress in that area  and still working on it.  Short Term Goal: 2.Identify an anxiety coping mechanism that has been successful in the past and increase its use. Increase understanding of beliefs and messages that produce anxiety.  Strategy: Patient will think of a coping skill that has been successful in the past with anxiety and use it in coping with current anxieties. He will also focus  on and be able to explainin sessionhis understanding of beliefs and messages that produce anxiety.  (Progressing)  Patient is showing some progress on this goal as he is working to increase his understanding of the impact that anxiety-provoking thoughts and messages have on him.  Working, as noted above, on the recognition of triggers and trying to replace thoughts that support anxiety with thoughts that do not.  He is taking the steps and reports he feels this has been successful twice for him.  Also showing some decrease in impulsive giving in to certain thoughts that lead him to anxious behaviors. Will re-assess next session. To remain on med as prescribed.  Goal review and progress noted with patient.   Return within 2 wks for next appt.   [name], LCSW

## 2019-04-13 DIAGNOSIS — G4733 Obstructive sleep apnea (adult) (pediatric): Secondary | ICD-10-CM | POA: Diagnosis not present

## 2019-04-20 ENCOUNTER — Other Ambulatory Visit: Payer: Self-pay

## 2019-04-20 ENCOUNTER — Encounter (INDEPENDENT_AMBULATORY_CARE_PROVIDER_SITE_OTHER): Payer: BC Managed Care – PPO | Admitting: Ophthalmology

## 2019-04-20 DIAGNOSIS — H353111 Nonexudative age-related macular degeneration, right eye, early dry stage: Secondary | ICD-10-CM

## 2019-04-20 DIAGNOSIS — H43813 Vitreous degeneration, bilateral: Secondary | ICD-10-CM

## 2019-04-20 DIAGNOSIS — H35033 Hypertensive retinopathy, bilateral: Secondary | ICD-10-CM

## 2019-04-20 DIAGNOSIS — I10 Essential (primary) hypertension: Secondary | ICD-10-CM | POA: Diagnosis not present

## 2019-04-20 DIAGNOSIS — H353221 Exudative age-related macular degeneration, left eye, with active choroidal neovascularization: Secondary | ICD-10-CM

## 2019-04-25 ENCOUNTER — Ambulatory Visit (INDEPENDENT_AMBULATORY_CARE_PROVIDER_SITE_OTHER): Payer: BC Managed Care – PPO | Admitting: Psychiatry

## 2019-04-25 ENCOUNTER — Other Ambulatory Visit: Payer: Self-pay

## 2019-04-25 DIAGNOSIS — F411 Generalized anxiety disorder: Secondary | ICD-10-CM | POA: Diagnosis not present

## 2019-04-25 NOTE — Progress Notes (Signed)
[facility] Counselor/Therapist Progress Note  Patient ID: Shane [name], MRN: [id number],    Date: 04/25/2019  Time Spent: 60 minutes   10:00am to 11:00am  Virtual Visit with Video Note Connected with patient by a video enabled telemedicine/telehealth application or telephone, with their informed consent, and verified patient privacy and that I am speaking with the correct person using two identifiers. I discussed the limitations, risks, security and privacy concerns of performing psychotherapy and management service by telephone and the availability of in person appointments. I also discussed with the patient that there may be a patient responsible charge related to this service. The patient expressed understanding and agreed to proceed. I discussed the treatment planning with the patient. The patient was provided an opportunity to ask questions and all were answered. The patient agreed with the plan and demonstrated an understanding of the instructions. The patient was advised to call  our office if  symptoms worsen or feel they are in a crisis state and need immediate contact.   Therapist Location:  [facility] Patient Location: home   Treatment Type: Individual Therapy  Reported Symptoms:  Anxiety, depression (better), anger (lessened)  Mental Status Exam:  Appearance:   Casual     Behavior:  Appropriate and Sharing  Motor:  Normal  Speech/Language:   Normal Rate  Affect:  anxious  Mood:  anxious  Thought process:  normal, goal-directed  Thought content:    WNL  Sensory/Perceptual disturbances:    WNL  Orientation:  oriented to person, place, time/date, situation, day of week, month of year and year  Attention:  Good  Concentration:  Good  Memory:  WNL  Fund of knowledge:   Good  Insight:    Good  Judgment:   Good  Impulse Control:  Good   Risk Assessment: Danger to Self:  No Self-injurious Behavior: No Danger to Others: No Duty to  Warn:no Physical Aggression / Violence:No  Access to Firearms a concern: No  Gang Involvement:No   Subjective: Patient reports "anger management is some better and the Abilify seems to be helping".  Not having to use the Xanax quite as much.  Working on Mudlogger of anxious thoughts and making progress.   Interventions: Cognitive Behavioral Therapy and Ego-Supportive  Diagnosis:   ICD-10-CM   1. Generalized anxiety disorder  F41.1      Plan: Patient not signing tx updates on computer screen due to COVID  Treatment Goals: Goals remain on tx plan while patient works on strategies to meet his goals. Progress is documented each session in the "Progress" section on Plan.  Long Term Goal: Enhance the ability to handle effectively his depression and the full variety of life's anxieties.  Strategy:  Patient will work to recognize and interrupt triggers to his anxious or depressive thoughts  so as to interrupt them and replace with positive, more reality-based thoughts, which lead to more positive actions/behaviors.  (PROGRESS) Patient working on his anxious and depressive thoughts (depression has decreased).  Has remained alcohol-free. Feels the Abilify is helping. Self-awareness improving as he works on his goal, actually acknowledged how his drinking used to be more significant in his struggles than he had thought it was. Outlook and self-control improving . Sometimes catching triggers to his anxious thoughts and interrupting them before over-reacting (sometimes will take a break and go for a walk or practice deep breathing exercises). Worked on the triggers more today as well as maintaining his goal-related gains, which was a concern  earlier due to ups and downs in the past.  Anger management is better as patient states he still gets angry some "but not to a boiling point", which is really good for him.  Short Term Goal: Identify an anxiety coping mechanism that has been successful  in the past and increase its use. Increase understanding of beliefs and messages that produce anxiety.  Strategy: Patient will think of a coping skill that has been successful in the past with anxiety and use it in coping with current anxieties. He will also focus on and be able to explainin sessionhis understanding of beliefs and messages that produce anxiety.  (Progress) Patient demonstrating some progress on his short term goal as part of his work on his long term goal. With his depression decreased, he focused more on his anxiety. He has had some success in stopping/interrupting some of his "old" negative and "unproductive" thought patterns, so he is continuing to repeat those strategies to better manage some of his anxious thoughts and work on self-calming. Goal review and progress noted with patient.   Next session is within 2 weeks.   [name], LCSW

## 2019-05-02 ENCOUNTER — Other Ambulatory Visit: Payer: Self-pay

## 2019-05-02 ENCOUNTER — Ambulatory Visit (INDEPENDENT_AMBULATORY_CARE_PROVIDER_SITE_OTHER): Payer: BC Managed Care – PPO | Admitting: Adult Health

## 2019-05-02 ENCOUNTER — Encounter: Payer: Self-pay | Admitting: Adult Health

## 2019-05-02 DIAGNOSIS — F330 Major depressive disorder, recurrent, mild: Secondary | ICD-10-CM | POA: Diagnosis not present

## 2019-05-02 DIAGNOSIS — F39 Unspecified mood [affective] disorder: Secondary | ICD-10-CM | POA: Diagnosis not present

## 2019-05-02 DIAGNOSIS — F411 Generalized anxiety disorder: Secondary | ICD-10-CM

## 2019-05-02 NOTE — Progress Notes (Signed)
[facility] MD/PA/NP Medication Check  05/02/2019 10:16 AM Shane [name]  MRN:  [id number]  Chief Complaint:    HPI:  Describes mood today as "ok". Pleasant. Mood symptoms - decreased depression, anxiety, and irritability - "still having all the symptoms at times". Denies "flying off the handle" - "it's gotten better" - "not really tested yet". Feels like he and wife are getting along better. Stating "I still need to be more supportive of my wife and her situation". Stating "I need to learn to be helpful and keep my cool". Feels more "even-keeled" - not having as many "ups and downs". Has been busy at work, but able to "handle" things better. Denies any alcohol use since Labor day. Sees individual and couples therapist. Stable interest and motivation. Taking medications as prescribed and feels addition of Abilify has been "helpful".  Energy levels stable. Active, does not have a regular exercise routine. Works full-time in Engineer, technical sales. Enjoys some usual interests and activities. Married. Lives with wife of 3 years. Has 2 sons that live in [place]. Family in [place].  Appetite adequate. Weight stable.  Sleeps well most nights - "excellent". Averages 8 hours.Using CPAP machine x 3 months. Focus and concentration stable. Completing tasks. Managing aspects of household. Work going well. Denies SI or HI. Denies AH or VH.   Visit Diagnosis:    ICD-10-CM   1. Depression, major, recurrent, mild (HCC)  F33.0   2. Episodic mood disorder ([place])  F39   3. Generalized anxiety disorder  F41.1     Past Psychiatric History: Denies  Past Medical History:  Past Medical History:  Diagnosis Date  . CAD (coronary artery disease), s/p stent 2011 and CABG 2015 [facility])   . Hodgkin disease ([place]) 1997  . Hyperlipidemia   . Macular degeneration   . PVC's (premature ventricular contractions)     Past Surgical History:  Procedure Laterality Date  . CARDIAC CATHETERIZATION  08/2009   PCI -LAD  2.39mmx18mm Xience  . CORONARY ARTERY BYPASS GRAFT  02/2014    Family Psychiatric History: Denies  Family History:  Family History  Problem Relation Age of Onset  . Stroke Mother   . Cancer Mother   . COPD Mother   . Heart disease Father   . COPD Father   . Heart attack Father   . Heart Problems Brother        heart valve replaced at 37 years old  . Heart Problems Brother        MI at age 33; CABG at age 42  . Cancer Maternal Grandmother   . Colon cancer Neg Hx     Social History:  Social History   Socioeconomic History  . Marital status: Married    Spouse name: Not on file  . Number of children: Not on file  . Years of education: Not on file  . Highest education level: Not on file  Occupational History  . Not on file  Social Needs  . Financial resource strain: Not on file  . Food insecurity    Worry: Not on file    Inability: Not on file  . Transportation needs    Medical: Not on file    Non-medical: Not on file  Tobacco Use  . Smoking status: Never Smoker  . Smokeless tobacco: Former Systems developer  . Tobacco comment: a long time ago  Substance and Sexual Activity  . Alcohol use: Yes    Alcohol/week: 3.0 standard drinks    Types: 3 Cans  of beer per week    Comment: sometimes 3 beers daily  . Drug use: No  . Sexual activity: Not on file    Comment: Not reported  Lifestyle  . Physical activity    Days per week: Not on file    Minutes per session: Not on file  . Stress: Not on file  Relationships  . Social Herbalist on phone: Not on file    Gets together: Not on file    Attends religious service: Not on file    Active member of club or organization: Not on file    Attends meetings of clubs or organizations: Not on file    Relationship status: Not on file  Other Topics Concern  . Not on file  Social History Narrative  . Not on file    Allergies: No Known Allergies  Metabolic Disorder Labs: Lab Results  Component Value Date   HGBA1C 5.7  09/05/2018   No results found for: PROLACTIN Lab Results  Component Value Date   CHOL 146 09/05/2018   TRIG 146.0 09/05/2018   HDL 55.70 09/05/2018   CHOLHDL 3 09/05/2018   VLDL 29.2 09/05/2018   LDLCALC 61 09/05/2018   LDLCALC 69 11/16/2017   Lab Results  Component Value Date   TSH 4.47 09/05/2018   TSH 4.58 (H) 05/24/2018    Therapeutic Level Labs: No results found for: LITHIUM No results found for: VALPROATE No components found for:  CBMZ  Current Medications: Current Outpatient Medications  Medication Sig Dispense Refill  . ARIPiprazole (ABILIFY) 5 MG tablet Take 1/2 tablet daily x 7 days, then one tablet daily. 30 tablet 2  . aspirin 81 MG tablet Take 81 mg by mouth daily.    Marland Kitchen BESIVANCE 0.6 % SUSP as needed.   1  . carvedilol (COREG) 6.25 MG tablet Take 1 tablet by mouth twice daily. 180 tablet 0  . escitalopram (LEXAPRO) 20 MG tablet Take 1 tablet (20 mg total) by mouth daily. 30 tablet 2  . ibuprofen (ADVIL,MOTRIN) 200 MG tablet Take 200 mg by mouth every 6 (six) hours as needed.    Marland Kitchen LORazepam (ATIVAN) 0.5 MG tablet Take 1 tablet (0.5 mg total) by mouth every 8 (eight) hours as needed for anxiety. 60 tablet 1  . metFORMIN (GLUCOPHAGE XR) 750 MG 24 hr tablet Take 1 tablet (750 mg total) by mouth daily with breakfast. 90 tablet 3  . nitroGLYCERIN (NITROSTAT) 0.4 MG SL tablet Place 1 tablet (0.4 mg total) under the tongue every 5 (five) minutes as needed for chest pain. 25 tablet 6  . NONFORMULARY OR COMPOUNDED ITEM Shots for macular degeneration left eye    . rosuvastatin (CRESTOR) 20 MG tablet Take 1 tablet (20 mg total) by mouth daily. 90 tablet 3   No current facility-administered medications for this visit.     Medication Side Effects: none  Orders placed this visit:  No orders of the defined types were placed in this encounter.   Psychiatric Specialty Exam:  Review of Systems  Psychiatric/Behavioral: Negative for depression and substance abuse. The  patient is not nervous/anxious.   All other systems reviewed and are negative.   There were no vitals taken for this visit.There is no height or weight on file to calculate BMI.  General Appearance: Neat and Well Groomed  Eye Contact:  Good  Speech:  Normal Rate  Volume:  Normal  Mood:  Anxious, Depressed and Irritable  Affect:  Appropriate  Thought Process:  Coherent  Orientation:  Full (Time, Place, and Person)  Thought Content: Logical   Suicidal Thoughts:  No  Homicidal Thoughts:  No  Memory:  WNL  Judgement:  Good  Insight:  Good  Psychomotor Activity:  Normal  Concentration:  Concentration: Good  Recall:  NA  Fund of Knowledge: Good  Language: Good  Assets:  Communication Skills Desire for Improvement Financial Resources/Insurance Housing Intimacy Leisure Time Physical Health Resilience Social Support Talents/Skills Transportation Vocational/Educational  ADL's:  Intact  Cognition: WNL  Prognosis:  Good   Screenings:  GAD-7     Office Visit from 05/24/2018 in [facility] Visit from 04/03/2018 in [facility]  Total GAD-7 Score  2  9    PHQ2-9     Office Visit from 04/03/2019 in [facility] Visit from 02/27/2019 in [facility] from 11/29/2018 in [facility] from 09/05/2018 in [facility] Visit from 05/24/2018 in [facility]  PHQ-2 Total Score  1  2  0  0  0  PHQ-9 Total Score  4  4  0  2  1     Receiving Psychotherapy: Yes Rinaldo Cloud  Treatment Plan/Recommendations:   Plan:  1. Continue Lexapro 20mg  daily 2. Continue Ativan 0.5mg  BID 3. Continue Abilify 5mg  daily.  Continue therapy with Rinaldo Cloud.   RTC 4 weeks  Patient advised to contact office with any questions, adverse effects, or acute worsening in signs and symptoms.  Discussed  potential benefits, risk, and side effects of benzodiazepines to include potential risk of tolerance and dependence, as well as possible drowsiness.  Advised patient not to drive if experiencing drowsiness and to take lowest possible effective dose to minimize risk of dependence and tolerance.   Discussed potential metabolic side effects associated with atypical antipsychotics, as well as potential risk for movement side effects. Advised pt to contact office if movement side effects occur.   Aloha Gell, NP

## 2019-05-03 ENCOUNTER — Encounter: Payer: Self-pay | Admitting: Family Medicine

## 2019-05-08 DIAGNOSIS — F3289 Other specified depressive episodes: Secondary | ICD-10-CM | POA: Diagnosis not present

## 2019-05-09 ENCOUNTER — Other Ambulatory Visit: Payer: Self-pay

## 2019-05-09 ENCOUNTER — Ambulatory Visit (INDEPENDENT_AMBULATORY_CARE_PROVIDER_SITE_OTHER): Payer: BC Managed Care – PPO | Admitting: Psychiatry

## 2019-05-09 DIAGNOSIS — F411 Generalized anxiety disorder: Secondary | ICD-10-CM | POA: Diagnosis not present

## 2019-05-09 NOTE — Progress Notes (Signed)
[facility] Counselor/Therapist Progress Note  Patient ID: Shane [name], MRN: [id number],    Date: 05/09/2019  Time Spent: 60 minutes   10:00am to 11:00am  Virtual Visit with Video Note Connected with patient by a video enabled telemedicine/telehealth application or telephone, with their informed consent, and verified patient privacy and that I am speaking with the correct person using two identifiers. I discussed the limitations, risks, security and privacy concerns of performing psychotherapy and management service by telephone and the availability of in person appointments. I also discussed with the patient that there may be a patient responsible charge related to this service. The patient expressed understanding and agreed to proceed. I discussed the treatment planning with the patient. The patient was provided an opportunity to ask questions and all were answered. The patient agreed with the plan and demonstrated an understanding of the instructions. The patient was advised to call  our office if  symptoms worsen or feel they are in a crisis state and need immediate contact.   Therapist Location: [facility] Patient Location: home   Treatment Type: Individual Therapy  Reported Symptoms: anxiety, some depression (decreased some), anger (some better controlled)  Mental Status Exam:  Appearance:   Casual     Behavior:  Appropriate and Sharing  Motor:  Normal  Speech/Language:   Normal Rate  Affect:  anxious, some depresseion, anger (improved some)  Mood:  anxious and depressed  Thought process:  goal directed  Thought content:    WNL  Sensory/Perceptual disturbances:    WNL  Orientation:  oriented to person, place, time/date, situation, day of week, month of year and year  Attention:  Good  Concentration:  Good  Memory:  WNL  Fund of knowledge:   Good  Insight:    Good  Judgment:   Good  Impulse Control:  Good   Risk Assessment: Danger to Self:   No Self-injurious Behavior: No Danger to Others: No Duty to Warn:no Physical Aggression / Violence:No  Access to Firearms a concern: No  Gang Involvement:No   Subjective: Patient today reporting some improvement in depression and anger.  Anxiety remains both personally and within the world with all the election and things going on. Did have an initial marital session with new marital therapist recently. Reports he still has not been drinking at all.   Interventions: Cognitive Behavioral Therapy and Solution-Oriented/Positive Psychology  Diagnosis:   ICD-10-CM   1. Generalized anxiety disorder  F41.1      Plan: *Goals changed some today to better meet patient's needs. Patient not signing tx updates on computer screen due to COVID  Treatment Goals: Goals remain on tx planwhile patient works on strategies to meet his goals. Progress is documented each session in the "Progress" section on Plan.  Long Term Goal: Enhance the ability to handle effectively his depression and the full variety of life's anxieties.  Strategy:  Patient will work to recognize and interrupt triggers to his anxious or depressive thoughts  so as to interrupt them and replace with positive, more reality-based thoughts, which lead to more positive actions/behaviors. Short Term Goal: Identify an anxiety coping mechanism that has been successful in the past and increase its use. Increase understanding of beliefs and messages that produce anxiety.  Strategy: Patient will think of a coping skill that has been successful in the past with anxiety and use it in coping with current anxieties. He will also focus on and be able to explainin sessionhis understanding of beliefs  and messages that produce anxiety.  (PROGRESS): Patient has remained alcohol-free.  Also feels his medication is helping him better manage his anger and frustration.  Need to continue to work on my patience, have better listening skill, and  don't be quick to anger.  Talked about strategies in session that can help with this.  Paying more attention to his depressive/negative/anxious thoughts in order to interrupt and replace them with more positive/realistic/self-affirming thoughts. Outlook continues to improve. To continue his goal-directed work and will pick up next session.  Goals reviewed and progress noted.    Next appt within 2 weeks.   [name], LCSW

## 2019-05-10 ENCOUNTER — Other Ambulatory Visit: Payer: Self-pay | Admitting: Cardiovascular Disease

## 2019-05-10 DIAGNOSIS — E785 Hyperlipidemia, unspecified: Secondary | ICD-10-CM

## 2019-05-14 DIAGNOSIS — G4733 Obstructive sleep apnea (adult) (pediatric): Secondary | ICD-10-CM | POA: Diagnosis not present

## 2019-05-23 ENCOUNTER — Ambulatory Visit (INDEPENDENT_AMBULATORY_CARE_PROVIDER_SITE_OTHER): Payer: BC Managed Care – PPO | Admitting: Psychiatry

## 2019-05-23 ENCOUNTER — Other Ambulatory Visit: Payer: Self-pay

## 2019-05-23 DIAGNOSIS — F411 Generalized anxiety disorder: Secondary | ICD-10-CM

## 2019-05-23 NOTE — Progress Notes (Signed)
[facility] Counselor/Therapist Progress Note  Patient ID: Shane [name], MRN: [id number],    Date: 05/23/2019  Time Spent:  60 minutes   10:00am to 11:00am  Virtual Visit  Note Connected with patient by a video enabled telemedicine/telehealth application or telephone, with their informed consent, and verified patient privacy and that I am speaking with the correct person using two identifiers. I discussed the limitations, risks, security and privacy concerns of performing psychotherapy and management service by telephone and the availability of in person appointments. I also discussed with the patient that there may be a patient responsible charge related to this service. The patient expressed understanding and agreed to proceed. I discussed the treatment planning with the patient. The patient was provided an opportunity to ask questions and all were answered. The patient agreed with the plan and demonstrated an understanding of the instructions. The patient was advised to call  our office if  symptoms worsen or feel they are in a crisis state and need immediate contact.   Therapist Location: [facility] Patient Location: home  Treatment Type: Individual Therapy  Reported Symptoms:  anxiety, some depression, has remained alcohol-free since the end of Labor Day weekend  Mental Status Exam:  Appearance:   Casual     Behavior:  Appropriate and Sharing  Motor:  Normal  Speech/Language:   Normal  Affect:  anxious  Mood:  anxious and some depression  Thought process:  normal  Thought content:    WNL  Sensory/Perceptual disturbances:    WNL  Orientation:  oriented to person, place, time/date, situation, day of week, month of year and year  Attention:  Good  Concentration:  Good  Memory:  WNL  Fund of knowledge:   Good  Insight:    Good  Judgment:   Good  Impulse Control:  Fair   Risk Assessment: Danger to Self:  No Self-injurious Behavior: No Danger to  Others: No Duty to Warn:no Physical Aggression / Violence:No  Access to Firearms a concern: No  Gang Involvement:No   Subjective: Patient today reports anxiety, some depression.   Interventions: Cognitive Behavioral Therapy and Solution-Oriented/Positive Psychology  Diagnosis:   ICD-10-CM   1. Generalized anxiety disorder  F41.1     Plan:  Patient not signing tx updates on computer screen due to COVID  Treatment Goals: Goals remain on tx planwhile patient works on strategies to meet his goals. Progress is documented each session in the "Progress" section on Plan.  Long Term Goal: Enhance the ability to handle effectively his depression and the full variety of life's anxieties.  Strategy:  Patient will work to recognize and interrupt triggers to hisanxious or depressive thoughtsso as to interrupt them and replace with positive, more reality-based thoughts, which lead to more positive actions/behaviors.  Short Term Goal: Identify an anxiety coping mechanism that has been successful in the past and increase its use. Increase understanding of beliefs and messages that produce anxiety.  Strategy: Patient will think of a coping skill that has been successful in the past with anxiety and use it in coping with current anxieties. He will also focus on and be able to explainin sessionhis understanding of beliefs and messages that produce anxiety.  (PROGRESS): Continues to be alcohol-free which is helping, along with his meds which are also helping.  Working in therapy today on some of his anxious and negative thoughts, intercepting them and replacing them with more positive, reality-based thoughts that do not support anxiety nor negativity.  Easier for him to do when we do this in sessions versus he catching his anxious/negative thoughts outside of sessions.  But he is having some success with this.  Is continuing work on his anger which is significantly better and also trying  to exercise more patience and pausing sometimes before reacting/responding.  Goal review and progress/strengths noted with patient.   Next visit within 2-3 weeks.   Shane Ace, LCSW

## 2019-05-24 DIAGNOSIS — G4733 Obstructive sleep apnea (adult) (pediatric): Secondary | ICD-10-CM | POA: Diagnosis not present

## 2019-05-31 ENCOUNTER — Other Ambulatory Visit: Payer: Self-pay

## 2019-05-31 ENCOUNTER — Encounter (INDEPENDENT_AMBULATORY_CARE_PROVIDER_SITE_OTHER): Payer: BC Managed Care – PPO | Admitting: Ophthalmology

## 2019-05-31 DIAGNOSIS — H353111 Nonexudative age-related macular degeneration, right eye, early dry stage: Secondary | ICD-10-CM

## 2019-05-31 DIAGNOSIS — B399 Histoplasmosis, unspecified: Secondary | ICD-10-CM

## 2019-05-31 DIAGNOSIS — H43813 Vitreous degeneration, bilateral: Secondary | ICD-10-CM

## 2019-05-31 DIAGNOSIS — I10 Essential (primary) hypertension: Secondary | ICD-10-CM

## 2019-05-31 DIAGNOSIS — H348322 Tributary (branch) retinal vein occlusion, left eye, stable: Secondary | ICD-10-CM

## 2019-05-31 DIAGNOSIS — H35033 Hypertensive retinopathy, bilateral: Secondary | ICD-10-CM

## 2019-05-31 DIAGNOSIS — H353221 Exudative age-related macular degeneration, left eye, with active choroidal neovascularization: Secondary | ICD-10-CM

## 2019-05-31 DIAGNOSIS — H2513 Age-related nuclear cataract, bilateral: Secondary | ICD-10-CM

## 2019-06-06 ENCOUNTER — Encounter (INDEPENDENT_AMBULATORY_CARE_PROVIDER_SITE_OTHER): Payer: BC Managed Care – PPO | Admitting: Ophthalmology

## 2019-06-12 ENCOUNTER — Other Ambulatory Visit: Payer: Self-pay

## 2019-06-12 ENCOUNTER — Ambulatory Visit (INDEPENDENT_AMBULATORY_CARE_PROVIDER_SITE_OTHER): Payer: BC Managed Care – PPO | Admitting: Psychiatry

## 2019-06-12 DIAGNOSIS — F411 Generalized anxiety disorder: Secondary | ICD-10-CM | POA: Diagnosis not present

## 2019-06-12 NOTE — Progress Notes (Signed)
[facility] Counselor/Therapist Progress Note  Patient ID: Shane [name], MRN: [id number],    Date: 06/12/2019  Time Spent:  60 minutes 9:00am to 10:00am  Virtual Visit  Note Connected with patient by a video enabled telemedicine/telehealth application or telephone, with their informed consent, and verified patient privacy and that I am speaking with the correct person using two identifiers. I discussed the limitations, risks, security and privacy concerns of performing psychotherapy and management service by telephone and the availability of in person appointments. I also discussed with the patient that there may be a patient responsible charge related to this service. The patient expressed understanding and agreed to proceed. I discussed the treatment planning with the patient. The patient was provided an opportunity to ask questions and all were answered. The patient agreed with the plan and demonstrated an understanding of the instructions. The patient was advised to call  our office if  symptoms worsen or feel they are in a crisis state and need immediate contact.   Therapist Location: [facility]  Patient Location: home   Treatment Type: Individual Therapy  Reported Symptoms:  Anxiety, some marital communication concerns, some anger "but more mild than it used to be"  Mental Status Exam:  Appearance:   n/a  telehealth     Behavior:  Sharing  Motor:  Normal  Speech/Language:   Clear and Coherent  Affect:  n/a  telehealth  Mood:  anxious  Thought process:  goal directed  Thought content:    WNL  Sensory/Perceptual disturbances:    WNL  Orientation:  oriented to person, place, time/date, situation, day of week, month of year and year  Attention:  Good  Concentration:  Good  Memory:  WNL  Fund of knowledge:   Good  Insight:    Good  Judgment:   Good  Impulse Control:  Good   Risk Assessment: Danger to Self:  No Self-injurious Behavior: No Danger to  Others: No Duty to Warn:no Physical Aggression / Violence:No  Access to Firearms a concern: No  Gang Involvement:No   Subjective: Patient today reporting anxiety "but am some better.  Impulse control and anger management both are improving.   Interventions: Solution-Oriented/Positive Psychology  Diagnosis:   ICD-10-CM   1. Generalized anxiety disorder  F41.1      Plan: Patient not signing tx updates on computer screen due to COVID  Treatment Goals: Goals remain on tx planwhile patient works on strategies to meet his goals. Progress is documented each session in the "Progress" section on Plan.  Long Term Goal: Enhance the ability to handle effectively his depression and the full variety of life's anxieties.  Strategy:  Patient will work to recognize and interrupt triggers to hisanxious or depressive thoughtsso as to interrupt them and replace with positive, more reality-based thoughts, which lead to more positive actions/behaviors.  Short Term Goal: (Goal updated today) * Verbalize an understanding of how thoughts, physical feelings, and behavioral actions contribute to anxiety and anger and its treatment.  Strategy: Patient will think of a coping skill that has been successful in the past with anxiety and use it in coping with current anxieties. He will also focus on and be able to explainin sessionhis understanding of beliefs and messages that produce anxiety.  (PROGRESS): Patient continues to progress on his goals, especially in managing his anxiety and some anger better.  That improved anger management is positively influencing his marital relationship.  Remains alcohol free except allowed 1 beer a day  while they were on vacation recently and has limited drinking only to those times, and since returning home he has continued to not drink.  Patient acknowledges he still needs "to work on my anger and how I respond to those feelings."  Talked about his responses  and how he can manage some things better including grounding exercises, mindfulness, and "catching my anger early and not letting it escalate."  Marital issues are some better and they are looking to choose a different marital therapist as wife did not connect well with previous one.  Discussed his goals and we both agreed that the short term goal was not as relevant for him at this point as there have been changes in his situation.  Looked at different needs to be addressed for patient and decided upon an updated short term goal, noted above.  Goal review and progress noted with patient.      Next appt within 2-3 weeks.   [name], LCSW

## 2019-06-21 ENCOUNTER — Ambulatory Visit (INDEPENDENT_AMBULATORY_CARE_PROVIDER_SITE_OTHER): Payer: BC Managed Care – PPO | Admitting: Adult Health

## 2019-06-21 ENCOUNTER — Encounter: Payer: Self-pay | Admitting: Adult Health

## 2019-06-21 ENCOUNTER — Other Ambulatory Visit: Payer: Self-pay

## 2019-06-21 DIAGNOSIS — F331 Major depressive disorder, recurrent, moderate: Secondary | ICD-10-CM

## 2019-06-21 DIAGNOSIS — F321 Major depressive disorder, single episode, moderate: Secondary | ICD-10-CM

## 2019-06-21 DIAGNOSIS — F411 Generalized anxiety disorder: Secondary | ICD-10-CM | POA: Diagnosis not present

## 2019-06-21 DIAGNOSIS — F39 Unspecified mood [affective] disorder: Secondary | ICD-10-CM | POA: Diagnosis not present

## 2019-06-21 DIAGNOSIS — F410 Panic disorder [episodic paroxysmal anxiety] without agoraphobia: Secondary | ICD-10-CM

## 2019-06-21 MED ORDER — ESCITALOPRAM OXALATE 20 MG PO TABS: 20.0000 mg | ORAL_TABLET | Freq: Every day | ORAL | 1 refills | Status: DC

## 2019-06-21 MED ORDER — ARIPIPRAZOLE 5 MG PO TABS: ORAL_TABLET | ORAL | 1 refills | Status: DC

## 2019-06-21 MED ORDER — LORAZEPAM 0.5 MG PO TABS: 0.5000 mg | ORAL_TABLET | Freq: Two times a day (BID) | ORAL | 2 refills | Status: DC

## 2019-06-21 NOTE — Progress Notes (Signed)
Shane [name] [id number] 11-14-1965 53 y.o.  Virtual Visit via Telephone Note  I connected with pt on 06/21/19 at 11:40 AM EST by telephone and verified that I am speaking with the correct person using two identifiers.   I discussed the limitations, risks, security and privacy concerns of performing an evaluation and management service by telephone and the availability of in person appointments. I also discussed with the patient that there may be a patient responsible charge related to this service. The patient expressed understanding and agreed to proceed.   I discussed the assessment and treatment plan with the patient. The patient was provided an opportunity to ask questions and all were answered. The patient agreed with the plan and demonstrated an understanding of the instructions.   The patient was advised to call back or seek an in-person evaluation if the symptoms worsen or if the condition fails to improve as anticipated.  I provided 30 minutes of non-face-to-face time during this encounter.  The patient was located at home.  The provider was located at [place].   Aloha Gell, NP   Subjective:   Patient ID:  Shane [name] is a 53 y.o. (DOB 27-Dec-1965) male.  Chief Complaint: No chief complaint on file.   HPI Shane [name] presents for follow-up of GAd, MDD, insomnia, mood disorder, panic attacks.   Describes mood today as "ok". Pleasant. Mood symptoms - denies  depression, anxiety, and irritability. Stating "everything is much better than it was before". Feels like everything is "manageable". Mood is "level" -  "not rising up". He and wife doing well. Stable interest and motivation. Taking medications as prescribed.  Energy levels stable. Active, does not have a regular exercise routine. Works full-time in Engineer, technical sales. Enjoys some usual interests and activities. Married. Lives with wife of 3 years. Has 1 sons 91 that lives in [place] and 48 18 son in  [place]. Family in [place].  Appetite adequate. Weight stable.  Sleeps well most nights. Averages 8 hours.Using CPAP machine. Focus and concentration stable. Completing tasks. Managing aspects of household. Work going well. Denies SI or HI. Denies AH or VH.  Review of Systems:  Review of Systems  Musculoskeletal: Negative for gait problem.  Neurological: Negative for tremors.  Psychiatric/Behavioral:       Please refer to HPI    Medications: I have reviewed the patient's current medications.  Current Outpatient Medications  Medication Sig Dispense Refill  . ARIPiprazole (ABILIFY) 5 MG tablet Take one tablet daily. 90 tablet 1  . aspirin 81 MG tablet Take 81 mg by mouth daily.    Marland Kitchen BESIVANCE 0.6 % SUSP as needed.   1  . carvedilol (COREG) 6.25 MG tablet Take 1 tablet by mouth twice daily. 180 tablet 0  . escitalopram (LEXAPRO) 20 MG tablet Take 1 tablet (20 mg total) by mouth daily. 90 tablet 1  . ibuprofen (ADVIL,MOTRIN) 200 MG tablet Take 200 mg by mouth every 6 (six) hours as needed.    Marland Kitchen LORazepam (ATIVAN) 0.5 MG tablet Take 1 tablet (0.5 mg total) by mouth 2 (two) times daily. 60 tablet 2  . metFORMIN (GLUCOPHAGE XR) 750 MG 24 hr tablet Take 1 tablet (750 mg total) by mouth daily with breakfast. 90 tablet 3  . nitroGLYCERIN (NITROSTAT) 0.4 MG SL tablet Place 1 tablet (0.4 mg total) under the tongue every 5 (five) minutes as needed for chest pain. 25 tablet 6  . NONFORMULARY OR COMPOUNDED ITEM Shots for macular degeneration left eye    .  rosuvastatin (CRESTOR) 20 MG tablet Take 1 tablet by mouth daily. 90 tablet 0   No current facility-administered medications for this visit.    Medication Side Effects: None  Allergies: No Known Allergies  Past Medical History:  Diagnosis Date  . CAD (coronary artery disease), s/p stent 2011 and CABG 2015 [facility])   . Hodgkin disease ([place]) 1997  . Hyperlipidemia   . Macular degeneration   . PVC's (premature ventricular  contractions)     Family History  Problem Relation Age of Onset  . Stroke Mother   . Cancer Mother   . COPD Mother   . Heart disease Father   . COPD Father   . Heart attack Father   . Heart Problems Brother        heart valve replaced at 35 years old  . Heart Problems Brother        MI at age 67; CABG at age 30  . Cancer Maternal Grandmother   . Colon cancer Neg Hx     Social History   Socioeconomic History  . Marital status: Married    Spouse name: Not on file  . Number of children: Not on file  . Years of education: Not on file  . Highest education level: Not on file  Occupational History  . Not on file  Tobacco Use  . Smoking status: Never Smoker  . Smokeless tobacco: Former Systems developer  . Tobacco comment: a long time ago  Substance and Sexual Activity  . Alcohol use: Yes    Alcohol/week: 3.0 standard drinks    Types: 3 Cans of beer per week    Comment: sometimes 3 beers daily  . Drug use: No  . Sexual activity: Not on file    Comment: Not reported  Other Topics Concern  . Not on file  Social History Narrative  . Not on file   Social Determinants of Health   Financial Resource Strain:   . Difficulty of Paying Living Expenses: Not on file  Food Insecurity:   . Worried About Charity fundraiser in the Last Year: Not on file  . Ran Out of Food in the Last Year: Not on file  Transportation Needs:   . Lack of Transportation (Medical): Not on file  . Lack of Transportation (Non-Medical): Not on file  Physical Activity:   . Days of Exercise per Week: Not on file  . Minutes of Exercise per Session: Not on file  Stress:   . Feeling of Stress : Not on file  Social Connections:   . Frequency of Communication with Friends and Family: Not on file  . Frequency of Social Gatherings with Friends and Family: Not on file  . Attends Religious Services: Not on file  . Active Member of Clubs or Organizations: Not on file  . Attends Archivist Meetings: Not on file   . Marital Status: Not on file  Intimate Partner Violence:   . Fear of Current or Ex-Partner: Not on file  . Emotionally Abused: Not on file  . Physically Abused: Not on file  . Sexually Abused: Not on file    Past Medical History, Surgical history, Social history, and Family history were reviewed and updated as appropriate.   Please see review of systems for further details on the patient's review from today.   Objective:   Physical Exam:  There were no vitals taken for this visit.  Physical Exam Constitutional:      General: He  is not in acute distress.    Appearance: He is well-developed.  Musculoskeletal:        General: No deformity.  Neurological:     Mental Status: He is alert and oriented to person, place, and time.     Coordination: Coordination normal.  Psychiatric:        Attention and Perception: Attention and perception normal. He does not perceive auditory or visual hallucinations.        Mood and Affect: Mood normal. Mood is not anxious or depressed. Affect is not labile, blunt, angry or inappropriate.        Speech: Speech normal.        Behavior: Behavior normal.        Thought Content: Thought content normal. Thought content is not paranoid or delusional. Thought content does not include homicidal or suicidal ideation. Thought content does not include homicidal or suicidal plan.        Cognition and Memory: Cognition and memory normal.        Judgment: Judgment normal.     Comments: Insight intact     Lab Review:     Component Value Date/Time   NA 139 11/29/2018 0937   NA 141 12/24/2015 1051   K 4.5 11/29/2018 0937   K 4.6 12/24/2015 1051   CL 105 11/29/2018 0937   CO2 24 11/29/2018 0937   CO2 28 12/24/2015 1051   GLUCOSE 107 (H) 11/29/2018 0937   GLUCOSE 95 12/24/2015 1051   BUN 20 11/29/2018 0937   BUN 14.6 12/24/2015 1051   CREATININE 1.20 11/29/2018 0937   CREATININE 1.1 12/24/2015 1051   CALCIUM 9.1 11/29/2018 0937   CALCIUM 9.8  12/24/2015 1051   PROT 6.8 11/29/2018 0937   PROT 7.6 12/24/2015 1051   ALBUMIN 4.4 11/29/2018 0937   ALBUMIN 4.5 12/24/2015 1051   AST 42 (H) 11/29/2018 0937   AST 25 12/24/2015 1051   ALT 59 (H) 11/29/2018 0937   ALT 34 12/24/2015 1051   ALKPHOS 70 11/29/2018 0937   ALKPHOS 45 12/24/2015 1051   BILITOT 0.5 11/29/2018 0937   BILITOT 0.99 12/24/2015 1051       Component Value Date/Time   WBC 6.4 09/05/2018 1119   RBC 4.37 09/05/2018 1119   HGB 14.3 09/05/2018 1119   HGB 15.7 12/24/2015 1050   HCT 41.4 09/05/2018 1119   HCT 44.7 12/24/2015 1050   PLT 252.0 09/05/2018 1119   PLT 263 12/24/2015 1050   MCV 94.8 09/05/2018 1119   MCV 93 12/24/2015 1050   MCH 32.6 12/24/2015 1050   MCHC 34.6 09/05/2018 1119   RDW 12.9 09/05/2018 1119   RDW 12.9 12/24/2015 1050   LYMPHSABS 1.5 09/05/2018 1119   LYMPHSABS 1.3 12/24/2015 1050   MONOABS 0.6 09/05/2018 1119   EOSABS 0.1 09/05/2018 1119   EOSABS 0.1 12/24/2015 1050   BASOSABS 0.0 09/05/2018 1119   BASOSABS 0.0 12/24/2015 1050    No results found for: POCLITH, LITHIUM   No results found for: PHENYTOIN, PHENOBARB, VALPROATE, CBMZ   .res Assessment: Plan:   Plan  1. Continue Lexapro 20mg  daily 2. Continue Ativan 0.5mg  BID 3. Continue Abilify 5mg  daily.  Will send in a 90 day supply on all medications.  Continue therapy with Rinaldo Cloud.   RTC 3 months  Patient advised to contact office with any questions, adverse effects, or acute worsening in signs and symptoms.  Discussed potential benefits, risk, and side effects of benzodiazepines to include potential risk of tolerance  and dependence, as well as possible drowsiness.  Advised patient not to drive if experiencing drowsiness and to take lowest possible effective dose to minimize risk of dependence and tolerance.   Discussed potential metabolic side effects associated with atypical antipsychotics, as well as potential risk for movement side effects. Advised pt to  contact office if movement side effects occur.  Diagnoses and all orders for this visit:  Depression, major, single episode, moderate ([place]) Comments: Marriage issues. Wife with mood disorder. Orders: -     escitalopram (LEXAPRO) 20 MG tablet; Take 1 tablet (20 mg total) by mouth daily.  Generalized anxiety disorder -     ARIPiprazole (ABILIFY) 5 MG tablet; Take one tablet daily.  Major depressive disorder, recurrent episode, moderate ([facility]) -     ARIPiprazole (ABILIFY) 5 MG tablet; Take one tablet daily.  Episodic mood disorder (H[facility]) -     ARIPiprazole (ABILIFY) 5 MG tablet; Take one tablet daily.  Anxiety attack Comments: Ativan low dose, sparingly.  Orders: -     LORazepam (ATIVAN) 0.5 MG tablet; Take 1 tablet (0.5 mg total) by mouth 2 (two) times daily.    Please see After Visit Summary for patient specific instructions.  Future Appointments  Date Time Provider [place]  06/26/2019  9:00 AM [name], LCSW CP[facility]CP None  06/28/2019  1:00 PM Hayden Pedro, MD T[facility] None  07/10/2019  9:00 AM [name], LCSW [facility] None  09/03/2019  8:30 AM Ward Givens, NP [facility] None    No orders of the defined types were placed in this encounter.     -------------------------------

## 2019-06-26 ENCOUNTER — Ambulatory Visit (INDEPENDENT_AMBULATORY_CARE_PROVIDER_SITE_OTHER): Payer: BC Managed Care – PPO | Admitting: Psychiatry

## 2019-06-26 ENCOUNTER — Other Ambulatory Visit: Payer: Self-pay

## 2019-06-26 DIAGNOSIS — F411 Generalized anxiety disorder: Secondary | ICD-10-CM | POA: Diagnosis not present

## 2019-06-26 NOTE — Progress Notes (Signed)
[facility] Counselor/Therapist Progress Note  Patient ID: Shane [name], MRN: [id number],    Date: 06/26/2019  Time Spent:  55 minutes   9:00am to 9:55 am  Virtual Visit with Video Note Connected with patient by a video enabled telemedicine/telehealth application or telephone, with their informed consent, and verified patient privacy and that I am speaking with the correct person using two identifiers. I discussed the limitations, risks, security and privacy concerns of performing psychotherapy and management service by telephone and the availability of in person appointments. I also discussed with the patient that there may be a patient responsible charge related to this service. The patient expressed understanding and agreed to proceed. I discussed the treatment planning with the patient. The patient was provided an opportunity to ask questions and all were answered. The patient agreed with the plan and demonstrated an understanding of the instructions. The patient was advised to call  our office if  symptoms worsen or feel they are in a crisis state and need immediate contact.   Therapist Location: [facility] Patient Location: home   Treatment Type: Individual Therapy  Reported Symptoms: anxiety, some depression, "just trying to cope with wife's anxieties", "is not drinking at all right now"  Mental Status Exam:  Appearance:   Casual     Behavior:  Appropriate and Sharing  Motor:  Normal  Speech/Language:   Normal Rate  Affect:  anxious, depressed (mild today)  Mood:  anxious and depressed  Thought process:  goal directed  Thought content:    WNL  Sensory/Perceptual disturbances:    WNL  Orientation:  oriented to person, place, time/date, situation, day of week, month of year and year  Attention:  Good  Concentration:  Good  Memory:  WNL  Fund of knowledge:   Good  Insight:    Good  Judgment:   Good  Impulse Control:  Good   Risk Assessment: Danger  to Self:  No Self-injurious Behavior: No Danger to Others: No Duty to Warn:no Physical Aggression / Violence:No  Access to Firearms a concern: No  Gang Involvement:No   Subjective:  Patient reports today that he and wife are doing some better communicating and trying to work on issues versus walking away.  "Trying to practice staying calmer in any situation."  Not drinking at all right now.    Interventions: Solution-Oriented/Positive Psychology and Ego-Supportive  Diagnosis:   ICD-10-CM   1. Generalized anxiety disorder  F41.1     Plan: Patient not signing tx updates on computer screen due to COVID  Treatment Goals: Goals remain on tx planwhile patient works on strategies to meet his goals. Progress is documented each session in the "Progress" section on Plan.  Long Term Goal: Enhance the ability to handle effectively his depression and the full variety of life's anxieties.  Strategy:  Patient will work to recognize and interrupt triggers to hisanxious or depressive thoughtsso as to interrupt them and replace with positive, more reality-based thoughts, which lead to more positive actions/behaviors.  Short Term Goal: (Goal updated today) * Verbalize an understanding of how thoughts, physical feelings, and behavioral actions contribute to anxiety and anger and its treatment.  Strategy: Patient will think of a coping skill that has been successful in the past with anxiety and use it in coping with current anxieties. He will also focus on and be able to explainin sessionhis understanding of beliefs and messages that produce anxiety.  (PROGRESS): Patient is progressing on his goals, especially in  improving his anxiety and anger management and "not flying off the handle, and I just feel better as I'm not on a roller coaster, starting to sleep some better which helps a lot.".  Needing to work further on anxiety especially when it involves his relationship with wife and  family---need to "be more patient and practice active listening with others (not just family), listening to really hear the person versus listening to respond only."  Worked on this and his stress-induced anger, interrupting it early, giving himself time to calm down emotionally, and then try to better understand and work through it so as to let go of it rather than hanging on and letting it fester, (per Short Term goal), and the occurrences of any elevated anger are decreasing. Is becoming more aware of anger and frustration and trying to manage it sooner rather than later.  His anger has been a longer term issue and he is making progress gradually and he is starting to recognize that progress.  Goal review and progress/efforts noted with patient.   Next appt within 2-3 weeks.   [name], LCSW

## 2019-06-28 ENCOUNTER — Other Ambulatory Visit: Payer: Self-pay

## 2019-06-28 ENCOUNTER — Encounter (INDEPENDENT_AMBULATORY_CARE_PROVIDER_SITE_OTHER): Payer: BC Managed Care – PPO | Admitting: Ophthalmology

## 2019-06-28 DIAGNOSIS — M329 Systemic lupus erythematosus, unspecified: Secondary | ICD-10-CM

## 2019-06-28 DIAGNOSIS — H348322 Tributary (branch) retinal vein occlusion, left eye, stable: Secondary | ICD-10-CM

## 2019-06-28 DIAGNOSIS — H2512 Age-related nuclear cataract, left eye: Secondary | ICD-10-CM

## 2019-06-28 DIAGNOSIS — H353111 Nonexudative age-related macular degeneration, right eye, early dry stage: Secondary | ICD-10-CM

## 2019-06-28 DIAGNOSIS — H43813 Vitreous degeneration, bilateral: Secondary | ICD-10-CM

## 2019-06-28 DIAGNOSIS — H353221 Exudative age-related macular degeneration, left eye, with active choroidal neovascularization: Secondary | ICD-10-CM

## 2019-06-28 DIAGNOSIS — I10 Essential (primary) hypertension: Secondary | ICD-10-CM | POA: Diagnosis not present

## 2019-06-28 DIAGNOSIS — H35033 Hypertensive retinopathy, bilateral: Secondary | ICD-10-CM | POA: Diagnosis not present

## 2019-07-10 ENCOUNTER — Ambulatory Visit (INDEPENDENT_AMBULATORY_CARE_PROVIDER_SITE_OTHER): Payer: BC Managed Care – PPO | Admitting: Psychiatry

## 2019-07-10 DIAGNOSIS — F411 Generalized anxiety disorder: Secondary | ICD-10-CM

## 2019-07-10 NOTE — Progress Notes (Addendum)
[facility] Counselor/Therapist Progress Note  Patient ID: Shane [name], MRN: [id number],    Date: 07/10/2019  Time Spent: 60 minutes  9:00am to 10:00am   Virtual Visit wih Video Note Connected with patient by a video enabled telemedicine/telehealth application or telephone, with their informed consent, and verified patient privacy and that I am speaking with the correct person using two identifiers. I discussed the limitations, risks, security and privacy concerns of performing psychotherapy and management service by telephone and the availability of in person appointments. I also discussed with the patient that there may be a patient responsible charge related to this service. The patient expressed understanding and agreed to proceed. I discussed the treatment planning with the patient. The patient was provided an opportunity to ask questions and all were answered. The patient agreed with the plan and demonstrated an understanding of the instructions. The patient was advised to call  our office if  symptoms worsen or feel they are in a crisis state and need immediate contact.   Therapist Location: [facility] Patient Location: home  Treatment Type: Individual Therapy  Reported Symptoms: anxiety, still struggling in marriage (has marital appt early January with wife)  Mental Status Exam:  Appearance:   Casual     Behavior:  Appropriate and Sharing  Motor:  Normal  Speech/Language:   Normal Rate  Affect:  anxious  Mood:  anxious  Thought process:  normal  Thought content:    WNL  Sensory/Perceptual disturbances:    WNL  Orientation:  oriented to person, place, time/date, situation, day of week, month of year and year  Attention:  Good  Concentration:  Good  Memory:  WNL  Fund of knowledge:   Good  Insight:    Good  Judgment:   Good  Impulse Control:  Good   Risk Assessment: Danger to Self:  No Self-injurious Behavior: No Danger to Others: No Duty to  Warn:no Physical Aggression / Violence:No  Access to Firearms a concern: No  Gang Involvement:No   Subjective:  Patient today reporting continued anxiety and marital issues.  "About 75% of my normal happiness level".  Communication issues with wife have not been quite as bad.  Anxiety is "overall a little less".  Interventions: Solution-Oriented/Positive Psychology and Ego-Supportive  Diagnosis:   ICD-10-CM   1. Generalized anxiety disorder  F41.1      Plan: Patient not signing tx updates on computer screen due to COVID  Treatment Goals: Goals remain on tx planwhile patient works on strategies to meet his goals. Progress is documented each session in the "Progress" section on Plan.  Long Term Goal: Enhance the ability to handle effectively his depression and the full variety of life's anxieties.  Strategy:  Patient will work to recognize and interrupt triggers to hisanxious or depressive thoughtsso as to interrupt them and replace with positive, more reality-based thoughts, which lead to more positive actions/behaviors.  Short Term Goal:(Goal updated today) * Verbalize an understanding of how thoughts, physical feelings, and behavioral actions contribute to anxiety and anger and its treatment.  Strategy: Patient will think of a coping skill that has been successful in the past with anxiety and use it in coping with current anxieties. He will also focus on and be able to explainin sessionhis understanding of beliefs and messages that produce anxiety.  (PROGRESS): Patient stating his need to listen better to wife to really hear what she is saying, and respond appropriately. Still trying to work on his anger management  and reports it is improving. "I'm not getting as angry as I used to and am catching it earlier so as to not let it escalate."   More awareness of what he can control and what he can't. Depression decreased some.  Celebration of Lindsay went well.  Reports number of "bad incidents" between he and wife used to be daily and now he estimates they are about 3 times weekly---definite progress. Worked more today on his interruption of negative/anxious/depressinve thoughts which are still occurring.  He reports sometimes interrupting them but needs to be more "on task" with this.  He does seem to realize when he doesn't interrupt them, the negative effect they end up having on how he feels and ends up behaving. Needing to be more self-aware he feels will help him with catching more of the triggering thoughts. He is also to do some homework documenting his efforts and he feels this will be helpful.  Continues to be motivated.  Goal review and progress/efforts/motivation noted with patient.   Next appt within 2-3 weeks.   [name], LCSW

## 2019-07-14 DIAGNOSIS — G4733 Obstructive sleep apnea (adult) (pediatric): Secondary | ICD-10-CM | POA: Diagnosis not present

## 2019-07-23 DIAGNOSIS — F3289 Other specified depressive episodes: Secondary | ICD-10-CM | POA: Diagnosis not present

## 2019-07-23 DIAGNOSIS — F411 Generalized anxiety disorder: Secondary | ICD-10-CM | POA: Diagnosis not present

## 2019-07-24 ENCOUNTER — Encounter: Payer: Self-pay | Admitting: Cardiology

## 2019-07-24 ENCOUNTER — Telehealth: Payer: Self-pay

## 2019-07-24 ENCOUNTER — Telehealth (INDEPENDENT_AMBULATORY_CARE_PROVIDER_SITE_OTHER): Payer: BC Managed Care – PPO | Admitting: Cardiology

## 2019-07-24 VITALS — BP 168/86 | HR 67 | Ht 67.5 in | Wt 165.0 lb

## 2019-07-24 DIAGNOSIS — Z951 Presence of aortocoronary bypass graft: Secondary | ICD-10-CM | POA: Diagnosis not present

## 2019-07-24 DIAGNOSIS — E785 Hyperlipidemia, unspecified: Secondary | ICD-10-CM

## 2019-07-24 DIAGNOSIS — G4730 Sleep apnea, unspecified: Secondary | ICD-10-CM

## 2019-07-24 DIAGNOSIS — I10 Essential (primary) hypertension: Secondary | ICD-10-CM | POA: Diagnosis not present

## 2019-07-24 DIAGNOSIS — E8881 Metabolic syndrome: Secondary | ICD-10-CM

## 2019-07-24 DIAGNOSIS — I25810 Atherosclerosis of coronary artery bypass graft(s) without angina pectoris: Secondary | ICD-10-CM | POA: Diagnosis not present

## 2019-07-24 NOTE — Progress Notes (Signed)
Virtual Visit via Video Note   This visit type was conducted due to national recommendations for restrictions regarding the COVID-19 Pandemic (e.g. social distancing) in an effort to limit this patient's exposure and mitigate transmission in our community.  Due to his co-morbid illnesses, this patient is at least at moderate risk for complications without adequate follow up.  This format is felt to be most appropriate for this patient at this time.  All issues noted in this document were discussed and addressed.  A limited physical exam was performed with this format.  Please refer to the patient's chart for his consent to telehealth for [facility].   Date:  07/24/2019   ID:  Shane [name], DOB [birth year], MRN [id number]  Patient Location: Home Provider Location: Home  PCP:  Briscoe Deutscher, DO  Cardiologist:  Dr Gwenlyn Found Electrophysiologist:  None   Evaluation Performed:  Follow-Up Visit  Chief Complaint:  none  History of Present Illness:    Shane [name] is a 54 y.o. male with a history of coronary disease, he had a remote anterior MI in 2011 treated with an LAD DES.  He had progression of disease and underwent CABG x4 in August 2015.  Ejection fraction at the time of his CABG was 35%, this had improved to 50 to 55% by follow-up echocardiogram. Myoview and echo were done in Dec 2017.  His Myoview was low risk, his echo showed an EF of 50-55% with focal basilar hypertrophy and grade 2 DD.  Other medical issues include treated dyslipidemia, non-insulin-dependent diabetes, and recently diagnosed sleep apnea.  He had a positive sleep study in June.  He is now on CPAP.  He was contacted for routine office visit.  He last saw Dr. Gwenlyn Found October 2019.  He asked if he should have follow-up lab work, EKG, and an echo.  Since we saw him last he has done well.  He denies any unusual chest pain or shortness of breath.  He says he is recently made some dietary changes, he apparently has  stopped alcohol.  He does not exercise yet on a regular basis but he tells me he ordered an elliptical machine back in October and its due to come any day now.  He has had no issues with his medications.  The patient does not have symptoms concerning for COVID-19 infection (fever, chills, cough, or new shortness of breath).    Past Medical History:  Diagnosis Date  . CAD (coronary artery disease), s/p stent 2011 and CABG 2015 [facility])   . Hodgkin disease ([place]) 1997  . Hyperlipidemia   . Macular degeneration   . PVC's (premature ventricular contractions)    Past Surgical History:  Procedure Laterality Date  . CARDIAC CATHETERIZATION  08/2009   PCI -LAD 2.64mmx18mm Xience  . CORONARY ARTERY BYPASS GRAFT  02/2014     Current Meds  Medication Sig  . ARIPiprazole (ABILIFY) 5 MG tablet Take one tablet daily.  Marland Kitchen aspirin 81 MG tablet Take 81 mg by mouth daily.  Marland Kitchen BESIVANCE 0.6 % SUSP as needed.   . carvedilol (COREG) 6.25 MG tablet Take 1 tablet by mouth twice daily.  Marland Kitchen escitalopram (LEXAPRO) 20 MG tablet Take 1 tablet (20 mg total) by mouth daily.  Marland Kitchen ibuprofen (ADVIL,MOTRIN) 200 MG tablet Take 200 mg by mouth every 6 (six) hours as needed.  Marland Kitchen LORazepam (ATIVAN) 0.5 MG tablet Take 1 tablet (0.5 mg total) by mouth 2 (two) times daily. (Patient taking differently: Take  0.5 mg by mouth 2 (two) times daily. PRN)  . metFORMIN (GLUCOPHAGE XR) 750 MG 24 hr tablet Take 1 tablet (750 mg total) by mouth daily with breakfast.  . nitroGLYCERIN (NITROSTAT) 0.4 MG SL tablet Place 1 tablet (0.4 mg total) under the tongue every 5 (five) minutes as needed for chest pain.  . NONFORMULARY OR COMPOUNDED ITEM Shots for macular degeneration left eye  . rosuvastatin (CRESTOR) 20 MG tablet Take 1 tablet by mouth daily.     Allergies:   Patient has no known allergies.   Social History   Tobacco Use  . Smoking status: Never Smoker  . Smokeless tobacco: Former Systems developer  . Tobacco comment: a long time  ago  Substance Use Topics  . Alcohol use: Yes    Alcohol/week: 3.0 standard drinks    Types: 3 Cans of beer per week    Comment: sometimes 3 beers daily  . Drug use: No     Family Hx: The patient's family history includes COPD in his father and mother; Cancer in his maternal grandmother and mother; Heart Problems in his brother and brother; Heart attack in his father; Heart disease in his father; Stroke in his mother. There is no history of Colon cancer.  ROS:   Please see the history of present illness.    All other systems reviewed and are negative.   Prior CV studies:   The following studies were reviewed today: Myoview Dec 2017 Echo Dec 2017  Labs/Other Tests and Data Reviewed:    EKG:  An ECG dated 04/21/2018 was personally reviewed today and demonstrated:  NSR-HR 67, 1st AVB, septal Qs, PVCs, LAE  Recent Labs: 09/05/2018: Hemoglobin 14.3; Platelets 252.0; TSH 4.47 11/29/2018: ALT 59; BUN 20; Creatinine, Ser 1.20; Potassium 4.5; Sodium 139   Recent Lipid Panel Lab Results  Component Value Date/Time   CHOL 146 09/05/2018 11:19 AM   TRIG 146.0 09/05/2018 11:19 AM   HDL 55.70 09/05/2018 11:19 AM   CHOLHDL 3 09/05/2018 11:19 AM   LDLCALC 61 09/05/2018 11:19 AM    Wt Readings from Last 3 Encounters:  07/24/19 165 lb (74.8 kg)  04/03/19 172 lb 3.2 oz (78.1 kg)  03/23/19 170 lb (77.1 kg)     Objective:    Vital Signs:  BP (!) 168/86   Pulse 67   Ht 5' 7.5" (1.715 m)   Wt 165 lb (74.8 kg)   BMI 25.46 kg/m    VITAL SIGNS:  reviewed  ASSESSMENT & PLAN:    CAD- S/P AMI-PCI in 2011, s/p CABG x 4 in Aug 2015.  Myoview low risk Dec 2017.  No symptoms to suggest angina currently.  HTN- One reading today.  He did have some evidence of HCVD on echo. B/P will need f/u.  HLD- LDL was 61 in Feb 2020.  NIDDM- On Glucophage, consider the addition of Jardiance with his history of CAD.  Plan: I will arrange for an office visit with dr Gwenlyn Found in 3 months.  He should  monitor his B/P and record. I'll suggest he come fasting so his labs can be drawn then.   COVID-19 Education: The signs and symptoms of COVID-19 were discussed with the patient and how to seek care for testing (follow up with PCP or arrange E-visit).  The importance of social distancing was discussed today.  Time:   Today, I have spent 15 minutes with the patient with telehealth technology discussing the above problems.     Medication Adjustments/Labs and Tests Ordered:  Current medicines are reviewed at length with the patient today.  Concerns regarding medicines are outlined above.   Tests Ordered: No orders of the defined types were placed in this encounter.   Medication Changes: No orders of the defined types were placed in this encounter.   Follow Up:  In Person with Dr Gwenlyn Found- 3 months  Signed, Kerin Ransom, PA-C  07/24/2019 1:40 PM    [facility]

## 2019-07-24 NOTE — Telephone Encounter (Signed)
Contacted patient to discuss AVS Instructions. Gave patient Luke's recommendations from today's virtual office visit. Follow up appt scheduled with Dr Gwenlyn Found. Patient voiced understanding; AVS printed and mailed.

## 2019-07-24 NOTE — Patient Instructions (Addendum)
Medication Instructions:  Your physician recommends that you continue on your current medications as directed. Please refer to the Current Medication list given to you today. *If you need a refill on your cardiac medications before your next appointment, please call your pharmacy*  Lab Work: NONE  If you have labs (blood work) drawn today and your tests are completely normal, you will receive your results only by: Marland Kitchen MyChart Message (if you have MyChart) OR . A paper copy in the mail If you have any lab test that is abnormal or we need to change your treatment, we will call you to review the results.  Testing/Procedures: NONE   Follow-Up: At [facility], you and your health needs are our priority.  As part of our continuing mission to provide you with exceptional heart care, we have created designated Provider Care Teams.  These Care Teams include your primary Cardiologist (physician) and Advanced Practice Providers (APPs -  Physician Assistants and Nurse Practitioners) who all work together to provide you with the care you need, when you need it.  Your next appointment:   3 month(s)  The format for your next appointment:   In Person  Provider:   Quay Burow, MD  Other Instructions [place] ON A BLOOD PRESSURE LOG

## 2019-07-24 NOTE — Telephone Encounter (Signed)

## 2019-07-26 ENCOUNTER — Encounter (INDEPENDENT_AMBULATORY_CARE_PROVIDER_SITE_OTHER): Payer: BC Managed Care – PPO | Admitting: Ophthalmology

## 2019-07-26 ENCOUNTER — Ambulatory Visit (INDEPENDENT_AMBULATORY_CARE_PROVIDER_SITE_OTHER): Payer: BC Managed Care – PPO | Admitting: Psychiatry

## 2019-07-26 ENCOUNTER — Other Ambulatory Visit: Payer: Self-pay

## 2019-07-26 DIAGNOSIS — B399 Histoplasmosis, unspecified: Secondary | ICD-10-CM

## 2019-07-26 DIAGNOSIS — H35033 Hypertensive retinopathy, bilateral: Secondary | ICD-10-CM

## 2019-07-26 DIAGNOSIS — F411 Generalized anxiety disorder: Secondary | ICD-10-CM | POA: Diagnosis not present

## 2019-07-26 DIAGNOSIS — I10 Essential (primary) hypertension: Secondary | ICD-10-CM

## 2019-07-26 DIAGNOSIS — H34832 Tributary (branch) retinal vein occlusion, left eye, with macular edema: Secondary | ICD-10-CM | POA: Diagnosis not present

## 2019-07-26 DIAGNOSIS — H2512 Age-related nuclear cataract, left eye: Secondary | ICD-10-CM

## 2019-07-26 DIAGNOSIS — H43813 Vitreous degeneration, bilateral: Secondary | ICD-10-CM

## 2019-07-26 DIAGNOSIS — H353221 Exudative age-related macular degeneration, left eye, with active choroidal neovascularization: Secondary | ICD-10-CM | POA: Diagnosis not present

## 2019-07-26 NOTE — Progress Notes (Signed)
[facility] Counselor/Therapist Progress Note  Patient ID: Shane [name], MRN: [id number],    Date: 07/26/2019  Time Spent: 48 minutes 1:05pm to 1:53pm  Virtual Visit Note Connected with patient by a video enabled telemedicine/telehealth application or telephone, with their informed consent, and verified patient privacy and that I am speaking with the correct person using two identifiers. I discussed the limitations, risks, security and privacy concerns of performing psychotherapy and management service by telephone and the availability of in person appointments. I also discussed with the patient that there may be a patient responsible charge related to this service. The patient expressed understanding and agreed to proceed. I discussed the treatment planning with the patient. The patient was provided an opportunity to ask questions and all were answered. The patient agreed with the plan and demonstrated an understanding of the instructions. The patient was advised to call  our office if  symptoms worsen or feel they are in a crisis state and need immediate contact.   Therapist Location: [facility] Patient Location: home  Treatment Type: Individual Therapy  Reported Symptoms: anxiety, less anger  Mental Status Exam:  Appearance:   n/a   telehealth     Behavior:  Sharing  Motor:  n/a  telehealth  Speech/Language:   Normal Rate  Affect:  n/a  telehealth  Mood:  anxious  Thought process:  normal  Thought content:    WNL  Sensory/Perceptual disturbances:    WNL  Orientation:  oriented to person, place, time/date, situation, day of week, month of year and year  Attention:  Good  Concentration:  Good  Memory:  WNL  Fund of knowledge:   Good  Insight:    Good  Judgment:   Good  Impulse Control:  Good   Risk Assessment: Danger to Self:  No Self-injurious Behavior: No Danger to Others: No Duty to Warn:no Physical Aggression / Violence:No  Access to Firearms  a concern: No  Gang Involvement:No    Subjective: Patient today reports anxiety is his main symptom.  Did add that his anger has lessened.  Is not currently drinking. Did have a few drinks during holidays, definitely not excessive.  Interventions: Cognitive Behavioral Therapy and Solution-Oriented/Positive Psychology  Diagnosis:   ICD-10-CM   1. Generalized anxiety disorder  F41.1     Plan: Patient not signing tx updates on computer screen due to COVID  Treatment Goals: Goals remain on tx planwhile patient works on strategies to meet his goals. Progress is documented each session in the "Progress" section on Plan.  Long Term Goal: Enhance the ability to handle effectively his depression and the full variety of life's anxieties.  Strategy:  Patient will work to recognize and interrupt triggers to hisanxious or depressive thoughtsso as to interrupt them and replace with positive, more reality-based thoughts, which lead to more positive actions/behaviors.  Short Term Goal:(Goal updated today) * Verbalize an understanding of how thoughts, physical feelings, and behavioral actions contribute to anxiety and anger and its treatment.  Strategy: Patient will think of a coping skill that has been successful in the past with anxiety and use it in coping with current anxieties. He will also focus on and be able to explainin sessionhis understanding of beliefs and messages that produce anxiety.  (PROGRESS): Patient today reporting anxiety and that he and wife  had their first marital session recently and it went well.  Shared that he thinks his wife's trust in him is still building gradually.  Picked up  on issued discussed last session about his need to listen more effectively to his wife, to really hear her and not just to respond. Feels like he is making progress on that and worked on it some more today especially his listening skills and not jumping too quickly, but focus on  listening first.  Anger has decreased, and we discussed his continued anger management strategies including slowing his mind down, slowing down physically, and thinking before reacting. Reviewed self-care (mentally and physically) including activities with wife, doing some physical movement each day, and getting out of the house at least once daily even if it's just to get outside to get some fresh air or take a walk.  Holding on to his awareness of "what I can control and what I can't". Goal review and progress noted with patient.  Next appt within 2-3 weeks.     [name], LCSW

## 2019-08-07 DIAGNOSIS — F3289 Other specified depressive episodes: Secondary | ICD-10-CM | POA: Diagnosis not present

## 2019-08-07 DIAGNOSIS — F411 Generalized anxiety disorder: Secondary | ICD-10-CM | POA: Diagnosis not present

## 2019-08-08 ENCOUNTER — Other Ambulatory Visit: Payer: Self-pay | Admitting: Family Medicine

## 2019-08-08 ENCOUNTER — Other Ambulatory Visit: Payer: Self-pay | Admitting: Cardiovascular Disease

## 2019-08-08 DIAGNOSIS — E038 Other specified hypothyroidism: Secondary | ICD-10-CM

## 2019-08-08 DIAGNOSIS — E8881 Metabolic syndrome: Secondary | ICD-10-CM

## 2019-08-08 DIAGNOSIS — G4730 Sleep apnea, unspecified: Secondary | ICD-10-CM

## 2019-08-08 DIAGNOSIS — Z951 Presence of aortocoronary bypass graft: Secondary | ICD-10-CM

## 2019-08-08 DIAGNOSIS — E785 Hyperlipidemia, unspecified: Secondary | ICD-10-CM

## 2019-08-08 DIAGNOSIS — R0683 Snoring: Secondary | ICD-10-CM

## 2019-08-08 DIAGNOSIS — I25700 Atherosclerosis of coronary artery bypass graft(s), unspecified, with unstable angina pectoris: Secondary | ICD-10-CM

## 2019-08-08 DIAGNOSIS — E039 Hypothyroidism, unspecified: Secondary | ICD-10-CM

## 2019-08-08 DIAGNOSIS — F324 Major depressive disorder, single episode, in partial remission: Secondary | ICD-10-CM

## 2019-08-08 NOTE — Telephone Encounter (Signed)
Please review

## 2019-08-09 ENCOUNTER — Ambulatory Visit: Payer: BC Managed Care – PPO | Admitting: Psychiatry

## 2019-08-09 NOTE — Telephone Encounter (Signed)
Schedule TOC with Dr. Jerline Pain

## 2019-08-09 NOTE — Telephone Encounter (Signed)
Please contact pt to schedule OV to establish with new PCP, thanks!

## 2019-08-10 ENCOUNTER — Other Ambulatory Visit: Payer: Self-pay

## 2019-08-10 ENCOUNTER — Telehealth: Payer: Self-pay

## 2019-08-10 DIAGNOSIS — I25700 Atherosclerosis of coronary artery bypass graft(s), unspecified, with unstable angina pectoris: Secondary | ICD-10-CM

## 2019-08-10 DIAGNOSIS — E8881 Metabolic syndrome: Secondary | ICD-10-CM

## 2019-08-10 DIAGNOSIS — Z951 Presence of aortocoronary bypass graft: Secondary | ICD-10-CM

## 2019-08-10 DIAGNOSIS — G4730 Sleep apnea, unspecified: Secondary | ICD-10-CM

## 2019-08-10 DIAGNOSIS — F324 Major depressive disorder, single episode, in partial remission: Secondary | ICD-10-CM

## 2019-08-10 DIAGNOSIS — R0683 Snoring: Secondary | ICD-10-CM

## 2019-08-10 DIAGNOSIS — E038 Other specified hypothyroidism: Secondary | ICD-10-CM

## 2019-08-10 DIAGNOSIS — E039 Hypothyroidism, unspecified: Secondary | ICD-10-CM

## 2019-08-10 MED ORDER — METFORMIN HCL ER 750 MG PO TB24: 750.0000 mg | ORAL_TABLET | Freq: Every day | ORAL | 3 refills | Status: DC

## 2019-08-10 NOTE — Telephone Encounter (Signed)
Refill sent in

## 2019-08-10 NOTE — Telephone Encounter (Signed)
MEDICATION:metFORMIN (GLUCOPHAGE XR) 750 MG 24 hr tablet  PHARMACY: [facility], [place] Phone:  [phone]  Fax:  [phone]       Comments:  [facility] appt is May .24 2021 at 3pm.  **Let patient know to contact pharmacy at the end of the day to make sure medication is ready. **  ** Please notify patient to allow 48-72 hours to process**  **Encourage patient to contact the pharmacy for refills or they can request refills through [facility]**

## 2019-08-14 DIAGNOSIS — G4733 Obstructive sleep apnea (adult) (pediatric): Secondary | ICD-10-CM | POA: Diagnosis not present

## 2019-08-23 ENCOUNTER — Encounter (INDEPENDENT_AMBULATORY_CARE_PROVIDER_SITE_OTHER): Payer: BC Managed Care – PPO | Admitting: Ophthalmology

## 2019-08-23 ENCOUNTER — Ambulatory Visit (INDEPENDENT_AMBULATORY_CARE_PROVIDER_SITE_OTHER): Payer: BC Managed Care – PPO | Admitting: Psychiatry

## 2019-08-23 ENCOUNTER — Other Ambulatory Visit: Payer: Self-pay

## 2019-08-23 DIAGNOSIS — H353221 Exudative age-related macular degeneration, left eye, with active choroidal neovascularization: Secondary | ICD-10-CM

## 2019-08-23 DIAGNOSIS — H353111 Nonexudative age-related macular degeneration, right eye, early dry stage: Secondary | ICD-10-CM

## 2019-08-23 DIAGNOSIS — H35033 Hypertensive retinopathy, bilateral: Secondary | ICD-10-CM | POA: Diagnosis not present

## 2019-08-23 DIAGNOSIS — H34832 Tributary (branch) retinal vein occlusion, left eye, with macular edema: Secondary | ICD-10-CM | POA: Diagnosis not present

## 2019-08-23 DIAGNOSIS — I10 Essential (primary) hypertension: Secondary | ICD-10-CM

## 2019-08-23 DIAGNOSIS — F411 Generalized anxiety disorder: Secondary | ICD-10-CM | POA: Diagnosis not present

## 2019-08-23 DIAGNOSIS — H43813 Vitreous degeneration, bilateral: Secondary | ICD-10-CM

## 2019-08-23 NOTE — Progress Notes (Signed)
[facility] Counselor/Therapist Progress Note  Patient ID: Shane [name], MRN: [id number],    Date: 08/23/2019  Time Spent: 50 minutes   1:00pm to 1:50pm  Virtual Visit Note Connected with patient by a video enabled telemedicine/telehealth application or telephone, with their informed consent, and verified patient privacy and that I am speaking with the correct person using two identifiers. I discussed the limitations, risks, security and privacy concerns of performing psychotherapy and management service by telephone and the availability of in person appointments. I also discussed with the patient that there may be a patient responsible charge related to this service. The patient expressed understanding and agreed to proceed. I discussed the treatment planning with the patient. The patient was provided an opportunity to ask questions and all were answered. The patient agreed with the plan and demonstrated an understanding of the instructions. The patient was advised to call  our office if  symptoms worsen or feel they are in a crisis state and need immediate contact.   Therapist Location: [facility] Patient Location: home   Treatment Type: Individual Therapy  Reported Symptoms: anxiety, some anger "but more manageable"   Mental Status Exam:  Appearance:   n/a   telehealth     Behavior:  Sharing  Motor:  n/a  telehealth  Speech/Language:   Normal Rate  Affect:  n/a   telehealth  Mood:  anxious (improving)  Thought process:  normal  Thought content:    WNL  Sensory/Perceptual disturbances:    WNL  Orientation:  oriented to person, place, time/date, situation, day of week, month of year and year  Attention:  Good  Concentration:  Good  Memory:  WNL  Fund of knowledge:   Good  Insight:    Good  Judgment:   Good  Impulse Control:  Good   Risk Assessment: Danger to Self:  No Self-injurious Behavior: No Danger to Others: No Duty to Warn:no Physical  Aggression / Violence:No  Access to Firearms a concern: No  Gang Involvement:No   Subjective: Patient today reports some anxiety and some anger (lessened), work is going well, communication improving with wife at home. Trying to have conversations "about things beyond Covid" and not feel quite so limited.   Interventions: Cognitive Behavioral Therapy and Solution-Oriented/Positive Psychology  Diagnosis:   ICD-10-CM   1. Generalized anxiety disorder  F41.1      Plan: Patient not signing tx updates on computer screen due to COVID   Treatment Goals: Goals remain on tx planwhile patient works on strategies to meet his goals. Progress is documented each session in the "Progress" section on Plan.  Long Term Goal: Enhance the ability to handle effectively his depression and the full variety of life's anxieties.  Strategy:  Patient will work to recognize and interrupt triggers to hisanxious or depressive thoughtsso as to interrupt them and replace with positive, more reality-based thoughts, which lead to more positive actions/behaviors.  Short Term Goal:(Goal updated today) * Verbalize an understanding of how thoughts, physical feelings, and behavioral actions contribute to anxiety and anger and its treatment.  Strategy: Patient will think of a coping skill that has been successful in the past with anxiety and use it in coping with current anxieties. He will also focus on and be able to explainin sessionhis understanding of beliefs and messages that produce anxiety.  (PROGRESS): Patient's anxiety has decreased some and he reports work is going ok, and some communication improvement at home.  Also trying to have more  involvement in activities together with wife which has included some exercise and cross-stitching more recently.  He kiddingly shared about his trying cross-stitching with his wife and he is doing it and apparently actually feeling good about his efforts.  Continuing to make efforts to strengthen his marital relationship and support wife who struggles with some behavioral health concerns.  Has remained alcohol-free and notices the difference it makes in his anger management.  Continues to especially work on his listening skills which we reviewed today as well as working goal-directed efforts in recognizing his triggers earlier so as to be a Insurance account manager of his anger.  He is seeing some progress with this and also sees how it is positively affecting his marriage and his self-esteem.  Encourage him getting more exercise and especially getting out of the house more even just to get fresh air and not being cooped up for long periods of time. To continue focusing on what he can control and what he can't, To stay in the present, and to look for what might go right versus what might go wrong. Goal review and progress noted with patient.   Next appt within 3 weeks.   [name], LCSW

## 2019-08-24 DIAGNOSIS — G4733 Obstructive sleep apnea (adult) (pediatric): Secondary | ICD-10-CM | POA: Diagnosis not present

## 2019-09-03 ENCOUNTER — Encounter: Payer: Self-pay | Admitting: Adult Health

## 2019-09-03 ENCOUNTER — Telehealth: Payer: Self-pay

## 2019-09-03 ENCOUNTER — Ambulatory Visit: Payer: BC Managed Care – PPO | Admitting: Adult Health

## 2019-09-03 ENCOUNTER — Other Ambulatory Visit: Payer: Self-pay

## 2019-09-03 VITALS — BP 118/78 | HR 72 | Temp 96.8°F | Ht 67.5 in | Wt 163.6 lb

## 2019-09-03 DIAGNOSIS — G4733 Obstructive sleep apnea (adult) (pediatric): Secondary | ICD-10-CM | POA: Diagnosis not present

## 2019-09-03 DIAGNOSIS — Z9989 Dependence on other enabling machines and devices: Secondary | ICD-10-CM

## 2019-09-03 NOTE — Patient Instructions (Signed)
Continue using CPAP nightly and greater than 4 hours each night °If your symptoms worsen or you develop new symptoms please let us know.  ° °

## 2019-09-03 NOTE — Telephone Encounter (Signed)
Received confirmation "thank you, will process"

## 2019-09-03 NOTE — Telephone Encounter (Signed)
Per NP Sharyn Dross request, sent CPAP orders to [facility]. Sent via [employer].   Orders: Increase Pressure 6-20 Awaiting response.

## 2019-09-03 NOTE — Progress Notes (Signed)
PATIENT: Shane [name] DOB: 06/14/1966  REASON FOR VISIT: follow up HISTORY FROM: patient  HISTORY OF PRESENT ILLNESS: Today 09/03/19:  Shane [name] is a 54 year old male with a history of obstructive sleep apnea on CPAP.  He returns today for follow-up.  His download indicates that he uses machine 25 out of 30 days for compliance of 83%.  He uses machine greater than 4 hours each night.  On average he uses his machine 6 hours and 48 minutes.  His residual AHI is 13.9 on 6 to 16 cm of water with EPR 3.  Leak in the 95th percentile is 28.3 L/min.  According to the download 7.4 events was central and 2 was obstructive.  The patient states that he feels that the CPAP continues to work well for him.  He denies any new issues.   He returns today for an evaluation.  HISTORY 03/01/19 :  Shane [name] is a 54 year old male with a history of obstructive sleep apnea on CPAP.  He returns today for follow-up.  His download indicates that he uses machine 26 out of 30 days for compliance of 87%.  He uses machine greater than 4 hours each night.  On average he uses his machine 7 hours and 15 minutes.  His residual AHI is 5.4 on 6 to 16 cm of water with EPR 3.  His leak in the 95th percentile is 29.5 L/min.  Overall he feels that the CPAP has been beneficial.  He denies any new issues.  He returns today for an evaluation.  REVIEW OF SYSTEMS: Out of a complete 14 system review of symptoms, the patient complains only of the following symptoms, and all other reviewed systems are negative.  Epworth sleepiness score 4 Fatigue severity score 16  ALLERGIES: No Known Allergies  HOME MEDICATIONS: Outpatient Medications Prior to Visit  Medication Sig Dispense Refill  . ARIPiprazole (ABILIFY) 5 MG tablet Take one tablet daily. 90 tablet 1  . aspirin 81 MG tablet Take 81 mg by mouth daily.    Marland Kitchen BESIVANCE 0.6 % SUSP as needed.   1  . carvedilol (COREG) 6.25 MG tablet Take 1 tablet by mouth twice daily.  **Patient needs office visit for future refills.** 180 tablet 3  . escitalopram (LEXAPRO) 20 MG tablet Take 1 tablet (20 mg total) by mouth daily. 90 tablet 1  . ibuprofen (ADVIL,MOTRIN) 200 MG tablet Take 200 mg by mouth every 6 (six) hours as needed.    Marland Kitchen LORazepam (ATIVAN) 0.5 MG tablet Take 1 tablet (0.5 mg total) by mouth 2 (two) times daily. (Patient taking differently: Take 0.5 mg by mouth 2 (two) times daily. PRN) 60 tablet 2  . metFORMIN (GLUCOPHAGE XR) 750 MG 24 hr tablet Take 1 tablet (750 mg total) by mouth daily with breakfast. 90 tablet 3  . nitroGLYCERIN (NITROSTAT) 0.4 MG SL tablet Place 1 tablet (0.4 mg total) under the tongue every 5 (five) minutes as needed for chest pain. 25 tablet 6  . NONFORMULARY OR COMPOUNDED ITEM Shots for macular degeneration left eye    . rosuvastatin (CRESTOR) 20 MG tablet Take 1 tablet by mouth daily.  90 tablet 3   No facility-administered medications prior to visit.    PAST MEDICAL HISTORY: Past Medical History:  Diagnosis Date  . CAD (coronary artery disease), s/p stent 2011 and CABG 2015 [facility])   . Hodgkin disease ([place]) 1997  . Hyperlipidemia   . Macular degeneration   . PVC's (premature ventricular  contractions)     PAST SURGICAL HISTORY: Past Surgical History:  Procedure Laterality Date  . CARDIAC CATHETERIZATION  08/2009   PCI -LAD 2.49mmx18mm Xience  . CORONARY ARTERY BYPASS GRAFT  02/2014    FAMILY HISTORY: Family History  Problem Relation Age of Onset  . Stroke Mother   . Cancer Mother   . COPD Mother   . Heart disease Father   . COPD Father   . Heart attack Father   . Heart Problems Brother        heart valve replaced at 11 years old  . Heart Problems Brother        MI at age 21; CABG at age 69  . Cancer Maternal Grandmother   . Colon cancer Neg Hx     SOCIAL HISTORY: Social History   Socioeconomic History  . Marital status: Married    Spouse name: Not on file  . Number of children: Not on file    . Years of education: Not on file  . Highest education level: Not on file  Occupational History  . Not on file  Tobacco Use  . Smoking status: Never Smoker  . Smokeless tobacco: Former Systems developer  . Tobacco comment: a long time ago  Substance and Sexual Activity  . Alcohol use: Yes    Alcohol/week: 3.0 standard drinks    Types: 3 Cans of beer per week    Comment: sometimes 3 beers daily  . Drug use: No  . Sexual activity: Not on file    Comment: Not reported  Other Topics Concern  . Not on file  Social History Narrative  . Not on file   Social Determinants of Health   Financial Resource Strain:   . Difficulty of Paying Living Expenses: Not on file  Food Insecurity:   . Worried About Charity fundraiser in the Last Year: Not on file  . Ran Out of Food in the Last Year: Not on file  Transportation Needs:   . Lack of Transportation (Medical): Not on file  . Lack of Transportation (Non-Medical): Not on file  Physical Activity:   . Days of Exercise per Week: Not on file  . Minutes of Exercise per Session: Not on file  Stress:   . Feeling of Stress : Not on file  Social Connections:   . Frequency of Communication with Friends and Family: Not on file  . Frequency of Social Gatherings with Friends and Family: Not on file  . Attends Religious Services: Not on file  . Active Member of Clubs or Organizations: Not on file  . Attends Archivist Meetings: Not on file  . Marital Status: Not on file  Intimate Partner Violence:   . Fear of Current or Ex-Partner: Not on file  . Emotionally Abused: Not on file  . Physically Abused: Not on file  . Sexually Abused: Not on file      PHYSICAL EXAM  Vitals:   09/03/19 0833  BP: 118/78  Pulse: 72  Temp: (!) 96.8 F (36 C)  TempSrc: Oral  Weight: 163 lb 9.6 oz (74.2 kg)  Height: 5' 7.5" (1.715 m)   Body mass index is 25.25 kg/m.  Generalized: Well developed, in no acute distress  Chest: Lungs clear to auscultation  bilaterally  Neurological examination  Mentation: Alert oriented to time, place, history taking. Follows all commands speech and language fluent Cranial nerve II-XII: Extraocular movements were full, visual field were full on confrontational test Head turning and  shoulder shrug  were normal and symmetric. Motor: The motor testing reveals 5 over 5 strength of all 4 extremities. Good symmetric motor tone is noted throughout.  Sensory: Sensory testing is intact to soft touch on all 4 extremities. No evidence of extinction is noted.  Gait and station: Gait is normal.    DIAGNOSTIC DATA (LABS, IMAGING, TESTING) - I reviewed patient records, labs, notes, testing and imaging myself where available.  Lab Results  Component Value Date   WBC 6.4 09/05/2018   HGB 14.3 09/05/2018   HCT 41.4 09/05/2018   MCV 94.8 09/05/2018   PLT 252.0 09/05/2018      Component Value Date/Time   NA 139 11/29/2018 0937   NA 141 12/24/2015 1051   K 4.5 11/29/2018 0937   K 4.6 12/24/2015 1051   CL 105 11/29/2018 0937   CO2 24 11/29/2018 0937   CO2 28 12/24/2015 1051   GLUCOSE 107 (H) 11/29/2018 0937   GLUCOSE 95 12/24/2015 1051   BUN 20 11/29/2018 0937   BUN 14.6 12/24/2015 1051   CREATININE 1.20 11/29/2018 0937   CREATININE 1.1 12/24/2015 1051   CALCIUM 9.1 11/29/2018 0937   CALCIUM 9.8 12/24/2015 1051   PROT 6.8 11/29/2018 0937   PROT 7.6 12/24/2015 1051   ALBUMIN 4.4 11/29/2018 0937   ALBUMIN 4.5 12/24/2015 1051   AST 42 (H) 11/29/2018 0937   AST 25 12/24/2015 1051   ALT 59 (H) 11/29/2018 0937   ALT 34 12/24/2015 1051   ALKPHOS 70 11/29/2018 0937   ALKPHOS 45 12/24/2015 1051   BILITOT 0.5 11/29/2018 0937   BILITOT 0.99 12/24/2015 1051   Lab Results  Component Value Date   CHOL 146 09/05/2018   HDL 55.70 09/05/2018   LDLCALC 61 09/05/2018   TRIG 146.0 09/05/2018   CHOLHDL 3 09/05/2018   Lab Results  Component Value Date   HGBA1C 5.7 09/05/2018   No results found for: VITAMINB12 Lab  Results  Component Value Date   TSH 4.47 09/05/2018      ASSESSMENT AND PLAN 54 y.o. year old male  has a past medical history of CAD (coronary artery disease), s/p stent 2011 and CABG 2015 [facility]), Hodgkin disease ([place]) (1997), Hyperlipidemia, Macular degeneration, and PVC's (premature ventricular contractions). here with :  1.  Obstructive sleep apnea on CPAP  -Good compliance -Leak is comparable to previous download 6 months ago-patient does not feel the mask leaking -Residual AHI has increased to 13.9 -I will increase pressure 6-20-I will recheck a download in 1 to 2 months.  If residual AHI has not decreased he may require a titration for possible BiPAP therapy -Follow-up in 6 months or sooner if needed   I spent 15 minutes with the patient. 50% of this time was spent reviewing download   Ward Givens, MSN, NP-C 09/03/2019, 9:10 AM [facility] [address], [place], [place] [address] [phone]

## 2019-09-12 ENCOUNTER — Ambulatory Visit (INDEPENDENT_AMBULATORY_CARE_PROVIDER_SITE_OTHER): Payer: BC Managed Care – PPO | Admitting: Psychiatry

## 2019-09-12 DIAGNOSIS — F411 Generalized anxiety disorder: Secondary | ICD-10-CM

## 2019-09-12 NOTE — Progress Notes (Signed)
[facility] Counselor/Therapist Progress Note  Patient ID: Shane [name], MRN: [id number],    Date: 09/12/2019  Time Spent: 48 minutes 10:00am to 10:48am  Virtual Visit Note Connected with patient by a video enabled telemedicine/telehealth application or telephone, with their informed consent, and verified patient privacy and that I am speaking with the correct person using two identifiers. I discussed the limitations, risks, security and privacy concerns of performing psychotherapy and management service by telephone and the availability of in person appointments. I also discussed with the patient that there may be a patient responsible charge related to this service. The patient expressed understanding and agreed to proceed. I discussed the treatment planning with the patient. The patient was provided an opportunity to ask questions and all were answered. The patient agreed with the plan and demonstrated an understanding of the instructions. The patient was advised to call  our office if  symptoms worsen or feel they are in a crisis state and need immediate contact.   Therapist Location: [facility] Patient Location: home   Treatment Type: Individual Therapy  Reported Symptoms: anxiety, anger but better at managing it   Mental Status Exam:  Appearance:   n/a   telehealth     Behavior:  Appropriate and Sharing  Motor:  n/a  telehealth  Speech/Language:   Normal Rate  Affect:  n/a  telehealth  Mood:  anxious and some anger and more in control recently  Thought process:  goal directed  Thought content:    WNL  Sensory/Perceptual disturbances:    WNL  Orientation:  oriented to person, place, time/date, situation, day of week, month of year and year  Attention:  Good  Concentration:  Good  Memory:  WNL  Fund of knowledge:   Good  Insight:    Good  Judgment:   Good  Impulse Control:  Good   Risk Assessment: Danger to Self:  No Self-injurious Behavior:  No Danger to Others: No Duty to Warn:no Physical Aggression / Violence:No  Access to Firearms a concern: No  Gang Involvement:No   Subjective: Patient reporting anxiety and anger "but anger is not as sharp and is usually better controlled"   Interventions: Cognitive Behavioral Therapy and Solution-Oriented/Positive Psychology  Diagnosis:   ICD-10-CM   1. Generalized anxiety disorder  F41.1      Plan: Patient not signing tx updates on computer screen due to COVID   Treatment Goals: Goals remain on tx planwhile patient works on strategies to meet his goals. Progress is documented each session in the "Progress" section on Plan.  Long Term Goal: Enhance the ability to handle effectively his depression and the full variety of life's anxieties.  Short Term Goal: Verbalize an understanding of how thoughts, physical feelings, and behavioral actions contribute to anxiety and anger and its treatment.  Strategy:  Patient will work to recognize and interrupt triggers to hisanxious or depressive thoughtsso as to replace with positive, more reality-based thoughts, which lead to more positive actions/behaviors.  (PROGRESS): Patient today reports anxiety (current and future focused). Also experiencing anger but managing it better. Work is busier and still working from home, and causing more stress for patient. Continues to be more involved in activities together with wife. Exercising more after having purchased some exercise equipment for home. Remains alcohol-free. Working on listening skills and trying to be more attuned to wife as they communicate, and engaging more in communication. Working on trying not to [employer] as much when he needs to be  focusing more specifically on one thing in order to "do my best."  Self-esteem continues to improve. Enjoys visits with mother-in-law at [place] [facility]. Focused today on more quickly recognizing triggers to his anger, stress, and anxiety, and  interrupting those triggers to replace them with more positive, hopeful, reality-based thought patterns that do not support anxiety nor depression and do lead to more positive actions/behaviors.Encouraged at his progress and to continue looking for what may go right versus wrong, staying in the present rather than the past or jumping into the future, and focusing on what he can change versus what he cannot change.  Goal review and progress noted with patient.  Next appt within 2-3 weeks.   [name], LCSW

## 2019-09-25 ENCOUNTER — Ambulatory Visit (INDEPENDENT_AMBULATORY_CARE_PROVIDER_SITE_OTHER): Payer: BC Managed Care – PPO | Admitting: Psychiatry

## 2019-09-25 ENCOUNTER — Other Ambulatory Visit: Payer: Self-pay

## 2019-09-25 DIAGNOSIS — F411 Generalized anxiety disorder: Secondary | ICD-10-CM

## 2019-09-25 NOTE — Progress Notes (Signed)
      [facility] Counselor/Therapist Progress Note  Patient ID: Shane [name], MRN: [id number],    Date: 09/25/2019  Time Spent: 60 minutes   10:00am to 11:00am  Treatment Type: Individual Therapy  Reported Symptoms: anxiety  Mental Status Exam:  Appearance:   Casual     Behavior:  Appropriate and Sharing  Motor:  Normal  Speech/Language:   Normal Rate  Affect:  anxious  Mood:  anxious  Thought process:  normal  Thought content:    WNL  Sensory/Perceptual disturbances:    WNL  Orientation:  oriented to person, place, time/date, situation, day of week, month of year and year  Attention:  Good  Concentration:  Good  Memory:  WNL  Fund of knowledge:   Good  Insight:    Good  Judgment:   Good  Impulse Control:  Good   Risk Assessment: Danger to Self:  No Self-injurious Behavior: No Danger to Others: No Duty to Warn:no Physical Aggression / Violence:No  Access to Firearms a concern: No  Gang Involvement:No   Subjective:  Patient reports being more quiet and reserved and shares that things have been different at home and with him and wife.  Anxiety is primary symptom reported, feeling edgy at times and not "at ease".    Interventions: Cognitive Behavioral Therapy and Solution-Oriented/Positive Psychology  Diagnosis:   ICD-10-CM   1. Generalized anxiety disorder  F41.1      Plan: Patient not signing tx updates on computer screen due to COVID   Treatment Goals: Goals remain on tx planwhile patient works on strategies to meet his goals. Progress is documented each session in the "Progress" section on Plan.  Long Term Goal: Enhance the ability to handle effectively his depression and the full variety of life's anxieties.  Short Term Goal: Verbalize an understanding of how thoughts, physical feelings, and behavioral actions contribute to anxiety and anger and its treatment.  Strategy:  Patient will work to recognize and interrupt triggers to  hisanxious or depressive thoughtsso as to replace with positive, more reality-based thoughts, which lead to more positive actions/behaviors.  (PROGRESS): Patient looking today at measures to help his anxiety, most of which we are already working on.  Is focusing on giving his attention to one thing at a time versus so many things at once.  Also within relationship, is trying to spend more direct time and attention to wife. Wife's uncle died recently and things at home have been going ok but very quiet.  Work is challenging "but ok". Still alcohol-free. Anxious about marital relatonship, "the unknowns", the future (patient and wife's), Covid and what's going to happen. States his anxiety bothers him the most when it interrupts me from what I need to focus on.  Working to better his anxiety management through exercise, getting outside to take a break, and slow deliberate deep breathing exercises (demonstrated in session today."  Continue focusing on staying in the present versus past or future, and looking at what he can change and what he can't change.    Goal review and progress noted with patient.  Next appt within 2-3 weeks.   Shane Ace, LCSW

## 2019-09-27 ENCOUNTER — Encounter (INDEPENDENT_AMBULATORY_CARE_PROVIDER_SITE_OTHER): Payer: BC Managed Care – PPO | Admitting: Ophthalmology

## 2019-09-27 DIAGNOSIS — I10 Essential (primary) hypertension: Secondary | ICD-10-CM | POA: Diagnosis not present

## 2019-09-27 DIAGNOSIS — H353221 Exudative age-related macular degeneration, left eye, with active choroidal neovascularization: Secondary | ICD-10-CM

## 2019-09-27 DIAGNOSIS — H35033 Hypertensive retinopathy, bilateral: Secondary | ICD-10-CM | POA: Diagnosis not present

## 2019-09-27 DIAGNOSIS — H34832 Tributary (branch) retinal vein occlusion, left eye, with macular edema: Secondary | ICD-10-CM

## 2019-09-27 DIAGNOSIS — H353111 Nonexudative age-related macular degeneration, right eye, early dry stage: Secondary | ICD-10-CM | POA: Diagnosis not present

## 2019-09-27 DIAGNOSIS — H43813 Vitreous degeneration, bilateral: Secondary | ICD-10-CM

## 2019-10-09 ENCOUNTER — Ambulatory Visit (INDEPENDENT_AMBULATORY_CARE_PROVIDER_SITE_OTHER): Payer: BC Managed Care – PPO | Admitting: Psychiatry

## 2019-10-09 DIAGNOSIS — F411 Generalized anxiety disorder: Secondary | ICD-10-CM | POA: Diagnosis not present

## 2019-10-09 NOTE — Progress Notes (Signed)
[facility] Counselor/Therapist Progress Note  Patient ID: Shane [name], MRN: [id number],    Date: 10/09/2019  Time Spent:  50 minutes    12:00noon to 12:50pm    Virtual Visit Note Connected with patient by a video enabled telemedicine/telehealth application or telephone, with their informed consent, and verified patient privacy and that I am speaking with the correct person using two identifiers. I discussed the limitations, risks, security and privacy concerns of performing psychotherapy and management service by telephone and the availability of in person appointments. I also discussed with the patient that there may be a patient responsible charge related to this service. The patient expressed understanding and agreed to proceed. I discussed the treatment planning with the patient. The patient was provided an opportunity to ask questions and all were answered. The patient agreed with the plan and demonstrated an understanding of the instructions. The patient was advised to call  our office if  symptoms worsen or feel they are in a crisis state and need immediate contact.   Therapist Location: [facility] Patient Location: home   Treatment Type: Individual Therapy  Reported Symptoms: anxiety  Mental Status Exam:  Appearance:   n/a  telehealth     Behavior:  Appropriate and Sharing  Motor:  n/a   telehealthj  Speech/Language:   Normal Rate  Affect:  n/a  telehealth  Mood:  anxious  Thought process:  normal  Thought content:    WNL  Sensory/Perceptual disturbances:    WNL  Orientation:  oriented to person, place, time/date, situation, day of week, month of year and year  Attention:  Good  Concentration:  Good  Memory:  WNL  Fund of knowledge:   Good  Insight:    Good  Judgment:   Good  Impulse Control:  Good   Risk Assessment: Danger to Self:  No Self-injurious Behavior: No Danger to Others: No Duty to Warn:no Physical Aggression / Violence:No   Access to Firearms a concern: No  Gang Involvement:No   Subjective: Patient today reporting improvement in communication with wife, which helps patient's mood be more stable per his report.  Trying to enjoy "couple time" more intentionally.  Really struggling with some of his work issues.   Interventions: Cognitive Behavioral Therapy and Ego-Supportive  Diagnosis:   ICD-10-CM   1. Generalized anxiety disorder  F41.1      Plan: Patient not signing tx updates on computer screen due to COVID   Treatment Goals: Goals remain on tx planwhile patient works on strategies to meet his goals. Progress is documented each session in the "Progress" section on Plan.  Long Term Goal: Enhance the ability to handle effectively his depression and the full variety of life's anxieties.  Short Term Goal: Verbalize an understanding of how thoughts, physical feelings, and behavioral actions contribute to anxiety and anger and its treatment.  Strategy:  Patient will work to recognize and interrupt triggers to hisanxious or depressive thoughtsso as to replace with positive, more reality-based thoughts, which lead to more positive actions/behaviors.  PROGRESS): Patient remains alcohol-free.  Marital relationship has improved some.  Continues to have some anxiety about the "unknowns" of the future, and also if Covid is going to continue to decrease.  His relationship with wife's daughter is improving also. Discussed some work concerns that were the more important issue for him today. Using strategy above, was able to work on more quickly recognizing and intercepting triggers to his anxious thoughts (which he realizes are usually  exaggerated), and work to replace those thoughts with more hopeful, positive, and reality-based thoughts that do not support anxiety. To continue with focus on staying "in the present" not the past nor future.  Has had some success in using the slow, deliberate deep breathing  exercises taught in an earlier session.   Goal review and progress noted with patient.  Next appt within 2 weeks.   [name], LCSW

## 2019-10-16 ENCOUNTER — Encounter: Payer: BC Managed Care – PPO | Admitting: Family Medicine

## 2019-10-19 ENCOUNTER — Encounter: Payer: Self-pay | Admitting: Cardiovascular Disease

## 2019-10-19 ENCOUNTER — Ambulatory Visit: Payer: BC Managed Care – PPO | Admitting: Cardiovascular Disease

## 2019-10-19 ENCOUNTER — Other Ambulatory Visit: Payer: Self-pay

## 2019-10-19 VITALS — BP 142/78 | HR 59 | Ht 67.5 in | Wt 163.6 lb

## 2019-10-19 DIAGNOSIS — I10 Essential (primary) hypertension: Secondary | ICD-10-CM

## 2019-10-19 DIAGNOSIS — I25810 Atherosclerosis of coronary artery bypass graft(s) without angina pectoris: Secondary | ICD-10-CM | POA: Diagnosis not present

## 2019-10-19 DIAGNOSIS — E785 Hyperlipidemia, unspecified: Secondary | ICD-10-CM

## 2019-10-19 DIAGNOSIS — R002 Palpitations: Secondary | ICD-10-CM

## 2019-10-19 NOTE — Assessment & Plan Note (Signed)
History of hyperlipidemia on statin therapy with lipid profile performed 09/05/2018 revealing total cholesterol of 146, LDL of 61 and HDL 55.  We will check a lipid liver profile today

## 2019-10-19 NOTE — Assessment & Plan Note (Signed)
History of coronary artery disease status post anterior wall myocardial infarction 08/28/2009 treated with Xience drug-eluting stenting to his proximal LAD.  Because of recurrent symptoms he underwent cardiac catheterization August 2015 and had bypass surgery on 02/27/2014 with a LIMA to his LAD, vein to the ramus branch, obtuse marginal branch and the RCA.  His initial EF was 35% which ultimately improved up to normal by 2D echocardiogram most recently performed 07/02/2016.  He denies chest pain or shortness of breath.

## 2019-10-19 NOTE — Assessment & Plan Note (Signed)
History of essential hypertension blood pressure measured today 142/78.  He is on carvedilol.

## 2019-10-19 NOTE — Progress Notes (Signed)
10/19/2019 DAMEAN KELDER   [birth year]  [id number]  Primary Physician System, Pcp Not In Primary Cardiologist: Lorretta Harp MD Garret Reddish, [place], [place]  HPI:  Shane [name] is a 54 y.o.  thin appearing divorced Caucasian male father of 2 children [name] is also a patient of mine unfortunately recently lost her father several days ago. I last saw him in the office  04/21/2018.  He works as an Sales promotion account executive. His primary care physician is Dr. Cecille Amsterdam. He recently was married earlier this month and he and [name] honeymoon in [place]. His cardiac risk factor profile is remarkable for hyperlipidemia and family history the father who had bypass surgery at age 28 and a brother who had bypass surgery as well. He suffered an anterior wall myocardial infarction 08/28/09 with a Xience DES stent placed in his proximal LAD. Because of recurrent symptoms he underwent catheterization in August and ultimately coronary bypass grafting 02/27/14 with a LIMA to his LAD, a vein to ramus branch, obtuse marginal branch and the RCA. His ejection fraction was 35%. He recuperated nicely although he did not participate in cardiac rehabilitation. A 2-D echocardiogram performed 08/27/14 revealed an improvement in his ejection fraction up to 50-55% with anterolateral wall motion abnormality. While honeymooning in [place] he noticed prolonged recovery from exercise activity as well as "just not feeling right". He denies chest pain.I performed a Myoview stress test on him after that on 06/28/16 which was low risk and nonischemic and 2-D echo that revealed preserved LV function with EF of 50-55%. Those symptoms have since subsided.  Since I saw him 04/21/2018 he has seen Kerin Ransom virtually 07/24/2019.  He is done well.  He did buy an elliptical to exercise although he has not used it much.  He denies chest pain or shortness of breath.  He did buy a Kardio mobile device and his potentially noticed  some atrial fibrillation although he does not feel this.  No outpatient medications have been marked as taking for the 10/19/19 encounter (Office Visit) with Lorretta Harp, MD.     No Known Allergies  Social History   Socioeconomic History  . Marital status: Married    Spouse name: Not on file  . Number of children: Not on file  . Years of education: Not on file  . Highest education level: Not on file  Occupational History  . Not on file  Tobacco Use  . Smoking status: Never Smoker  . Smokeless tobacco: Former Systems developer  . Tobacco comment: a long time ago  Substance and Sexual Activity  . Alcohol use: Yes    Alcohol/week: 3.0 standard drinks    Types: 3 Cans of beer per week    Comment: sometimes 3 beers daily  . Drug use: No  . Sexual activity: Not on file    Comment: Not reported  Other Topics Concern  . Not on file  Social History Narrative  . Not on file   Social Determinants of Health   Financial Resource Strain:   . Difficulty of Paying Living Expenses:   Food Insecurity:   . Worried About Charity fundraiser in the Last Year:   . Arboriculturist in the Last Year:   Transportation Needs:   . Film/video editor (Medical):   Marland Kitchen Lack of Transportation (Non-Medical):   Physical Activity:   . Days of Exercise per Week:   . Minutes of Exercise per Session:  Stress:   . Feeling of Stress :   Social Connections:   . Frequency of Communication with Friends and Family:   . Frequency of Social Gatherings with Friends and Family:   . Attends Religious Services:   . Active Member of Clubs or Organizations:   . Attends Archivist Meetings:   Marland Kitchen Marital Status:   Intimate Partner Violence:   . Fear of Current or Ex-Partner:   . Emotionally Abused:   Marland Kitchen Physically Abused:   . Sexually Abused:      Review of Systems: General: negative for chills, fever, night sweats or weight changes.  Cardiovascular: negative for chest pain, dyspnea on exertion, edema,  orthopnea, palpitations, paroxysmal nocturnal dyspnea or shortness of breath Dermatological: negative for rash Respiratory: negative for cough or wheezing Urologic: negative for hematuria Abdominal: negative for nausea, vomiting, diarrhea, bright red blood per rectum, melena, or hematemesis Neurologic: negative for visual changes, syncope, or dizziness All other systems reviewed and are otherwise negative except as noted above.    Blood pressure (!) 142/78, pulse (!) 59, height 5' 7.5" (1.715 m), weight 163 lb 9.6 oz (74.2 kg).  General appearance: alert and no distress Neck: no adenopathy, no carotid bruit, no JVD, supple, symmetrical, trachea midline and thyroid not enlarged, symmetric, no tenderness/mass/nodules Lungs: clear to auscultation bilaterally Heart: regular rate and rhythm, S1, S2 normal, no murmur, click, rub or gallop Extremities: extremities normal, atraumatic, no cyanosis or edema Pulses: 2+ and symmetric Skin: Skin color, texture, turgor normal. No rashes or lesions Neurologic: Alert and oriented X 3, normal strength and tone. Normal symmetric reflexes. Normal coordination and gait  EKG sinus bradycardia 59 with left axis deviation and left atrial enlargement with septal Q waves.  I personally reviewed this EKG.  ASSESSMENT AND PLAN:   Dyslipidemia, goal LDL below 70 History of hyperlipidemia on statin therapy with lipid profile performed 09/05/2018 revealing total cholesterol of 146, LDL of 61 and HDL 55.  We will check a lipid liver profile today  S/P CABG (coronary artery bypass graft) History of coronary artery disease status post anterior wall myocardial infarction 08/28/2009 treated with Xience drug-eluting stenting to his proximal LAD.  Because of recurrent symptoms he underwent cardiac catheterization August 2015 and had bypass surgery on 02/27/2014 with a LIMA to his LAD, vein to the ramus branch, obtuse marginal branch and the RCA.  His initial EF was 35% which  ultimately improved up to normal by 2D echocardiogram most recently performed 07/02/2016.  He denies chest pain or shortness of breath.  Essential hypertension History of essential hypertension blood pressure measured today 142/78.  He is on carvedilol.      Lorretta Harp MD FACP,FACC,FAHA, [facility] 10/19/2019 11:05 AM

## 2019-10-19 NOTE — Patient Instructions (Addendum)
Medication Instructions:  Your physician recommends that you continue on your current medications as directed. Please refer to the Current Medication list given to you today.  *If you need a refill on your cardiac medications before your next appointment, please call your pharmacy*   Lab Work: Today (Lipid, Hepatic)  If you have labs (blood work) drawn today and your tests are completely normal, you will receive your results only by: Marland Kitchen MyChart Message (if you have MyChart) OR . A paper copy in the mail If you have any lab test that is abnormal or we need to change your treatment, we will call you to review the results.   Testing/Procedures: Your physician has recommended that you wear an event monitor (30 day). Event monitors are medical devices that record the heart's electrical activity. Doctors most often [place] these monitors to diagnose arrhythmias. Arrhythmias are problems with the speed or rhythm of the heartbeat. The monitor is a small, portable device. You can wear one while you do your normal daily activities. This is usually used to diagnose what is causing palpitations/syncope (passing out).   Follow-Up: At [facility], you and your health needs are our priority.  As part of our continuing mission to provide you with exceptional heart care, we have created designated Provider Care Teams.  These Care Teams include your primary Cardiologist (physician) and Advanced Practice Providers (APPs -  Physician Assistants and Nurse Practitioners) who all work together to provide you with the care you need, when you need it.  We recommend signing up for the patient portal called "MyChart".  Sign up information is provided on this After Visit Summary.  MyChart is used to connect with patients for Virtual Visits (Telemedicine).  Patients are able to view lab/test results, encounter notes, upcoming appointments, etc.  Non-urgent messages can be sent to your provider as well.   To learn more about  what you can do with MyChart, go to [email or web address].    Your next appointment:   12 month(s)  The format for your next appointment:   In Person  Provider:   Quay Burow, MD

## 2019-10-20 LAB — HEPATIC FUNCTION PANEL
ALT: 51 IU/L — ABNORMAL HIGH (ref 0–44)
AST: 34 IU/L (ref 0–40)
Albumin: 4.8 g/dL (ref 3.8–4.9)
Alkaline Phosphatase: 111 IU/L (ref 39–117)
Bilirubin Total: 1.3 mg/dL — ABNORMAL HIGH (ref 0.0–1.2)
Bilirubin, Direct: 0.3 mg/dL (ref 0.00–0.40)
Total Protein: 7.1 g/dL (ref 6.0–8.5)

## 2019-10-20 LAB — LIPID PANEL
Chol/HDL Ratio: 2.5 ratio (ref 0.0–5.0)
Cholesterol, Total: 109 mg/dL (ref 100–199)
HDL: 44 mg/dL (ref >39)
LDL Chol Calc (NIH): 50 mg/dL (ref 0–99)
Triglycerides: 75 mg/dL (ref 0–149)
VLDL Cholesterol Cal: 15 mg/dL (ref 5–40)

## 2019-10-23 ENCOUNTER — Ambulatory Visit (INDEPENDENT_AMBULATORY_CARE_PROVIDER_SITE_OTHER): Payer: BC Managed Care – PPO | Admitting: Psychiatry

## 2019-10-23 DIAGNOSIS — F411 Generalized anxiety disorder: Secondary | ICD-10-CM

## 2019-10-23 NOTE — Progress Notes (Signed)
[facility] Counselor/Therapist Progress Note  Patient ID: Shane [name], MRN: [id number],    Date: 10/23/2019  Time Spent: 50 minutes 11:00am to 11:50am  Virtual Visit Note Connected with patient by a video enabled telemedicine/telehealth application or telephone, with their informed consent, and verified patient privacy and that I am speaking with the correct person using two identifiers. I discussed the limitations, risks, security and privacy concerns of performing psychotherapy and management service by telephone and the availability of in person appointments. I also discussed with the patient that there may be a patient responsible charge related to this service. The patient expressed understanding and agreed to proceed. I discussed the treatment planning with the patient. The patient was provided an opportunity to ask questions and all were answered. The patient agreed with the plan and demonstrated an understanding of the instructions. The patient was advised to call  our office if  symptoms worsen or feel they are in a crisis state and need immediate contact.   Therapist Location: [facility] Patient Location: home   Treatment Type: Individual Therapy  Reported Symptoms: anxiety, "a couple of challenges in marriage this past week", communication issues  Mental Status Exam:  Appearance:   n/a   telehealth     Behavior:  Appropriate and Sharing  Motor:  n/a  telehealth  Speech/Language:   Normal Rate  Affect:  n/a  telehealth  Mood:  anxious  Thought process:  normal  Thought content:    WNL  Sensory/Perceptual disturbances:    WNL  Orientation:  oriented to person, place, time/date, situation, day of week, month of year and year  Attention:  Good  Concentration:  Good/Fair-  some distractions at work  Memory:  [employer] of knowledge:   Good  Insight:    Good  Judgment:   Good  Impulse Control:  Good   Risk Assessment: Danger to Self:   No Self-injurious Behavior: No Danger to Others: No Duty to Warn:no Physical Aggression / Violence:No  Access to Firearms a concern: No  Gang Involvement:No    Subjective:  Patient reports some work-related issues, and challenges in marital relationship, as well as some communication concerns within Sports administrator.    Interventions: Cognitive Behavioral Therapy and Ego-Supportive  Diagnosis:   ICD-10-CM   1. Generalized anxiety disorder  F41.1     Plan: Patient not signing tx updates on computer screen due to COVID   Treatment Goals: Goals remain on tx planwhile patient works on strategies to meet his goals. Progress is documented each session in the "Progress" section on Plan.  Long Term Goal: Enhance the ability to handle effectively his depression and the full variety of life's anxieties.  Short Term Goal: Verbalize an understanding of how thoughts, physical feelings, and behavioral actions contribute to anxiety and anger and its treatment.  Strategy:  Patient will work to recognize and interrupt triggers to hisanxious or depressive thoughtsso as to replace with positive, more reality-based thoughts, which lead to more positive actions/behaviors.  PROGRESS): Patient reports some unexpected challenges/conflict with wife that did get resolved within a couple days, and apologies from patient and wife did follow.  Patient stressed as wife tends to be short-tempered and angry until things improve.  Patient remains alcohol-free and feeling positive about that. Discussed today some ways for him to better respond to certain behaviors from his wife who has struggle for years with strong anxiety which at time can be very confining for her. Patient shared freely  of his experiences in trying to best respond to her and starting to realize sometimes his better response my be to listen, back off if needed, rather than trying to "fix".  They have made progress in their relationship  but still having frequent issues that arise, often due to some misunderstanding or miscommunication. Patient does report a lot of help from the breathing exercises that he practices and has used more recently in volatile situations with he and wife.  Also encouraging him to get outside more and "unplug" from devices, and spend some time taking walks as this is something he has enjoyed previously. Also working (with short term goal above) to verbalize a better understanding of how his thoughts and physical actions can contribute to anger and anxiety as well as help alleviate anger and anxiety.  His anger has improved a good bit but he note it can sometime resurface in stressful moments.   Goal review and progress/challenges noted with patient.  Next appt within 3 weeks.   Shane Ace, LCSW

## 2019-10-25 ENCOUNTER — Telehealth: Payer: Self-pay | Admitting: Radiology

## 2019-10-25 NOTE — Telephone Encounter (Signed)
Enrolled patient for a 30 day [employer] Event monitor to be mailed to patients home.  

## 2019-11-01 ENCOUNTER — Other Ambulatory Visit: Payer: Self-pay

## 2019-11-01 ENCOUNTER — Encounter (INDEPENDENT_AMBULATORY_CARE_PROVIDER_SITE_OTHER): Payer: BC Managed Care – PPO | Admitting: Ophthalmology

## 2019-11-01 DIAGNOSIS — I10 Essential (primary) hypertension: Secondary | ICD-10-CM

## 2019-11-01 DIAGNOSIS — H353221 Exudative age-related macular degeneration, left eye, with active choroidal neovascularization: Secondary | ICD-10-CM

## 2019-11-01 DIAGNOSIS — H353111 Nonexudative age-related macular degeneration, right eye, early dry stage: Secondary | ICD-10-CM | POA: Diagnosis not present

## 2019-11-01 DIAGNOSIS — H35033 Hypertensive retinopathy, bilateral: Secondary | ICD-10-CM | POA: Diagnosis not present

## 2019-11-01 DIAGNOSIS — H43813 Vitreous degeneration, bilateral: Secondary | ICD-10-CM

## 2019-11-02 ENCOUNTER — Encounter (INDEPENDENT_AMBULATORY_CARE_PROVIDER_SITE_OTHER): Payer: BC Managed Care – PPO

## 2019-11-02 DIAGNOSIS — R002 Palpitations: Secondary | ICD-10-CM

## 2019-11-06 ENCOUNTER — Telehealth: Payer: BC Managed Care – PPO | Admitting: Psychiatry

## 2019-11-06 ENCOUNTER — Ambulatory Visit (INDEPENDENT_AMBULATORY_CARE_PROVIDER_SITE_OTHER): Payer: BC Managed Care – PPO | Admitting: Psychiatry

## 2019-11-06 DIAGNOSIS — F411 Generalized anxiety disorder: Secondary | ICD-10-CM

## 2019-11-20 ENCOUNTER — Ambulatory Visit: Payer: BC Managed Care – PPO | Admitting: Psychiatry

## 2019-11-28 NOTE — Progress Notes (Signed)
[facility] Counselor/Therapist Progress Note  Patient ID: Shane [name], MRN: [id number],    Date: 11/30/2019  Time Spent: 60 minutes  2:00pm to 3:00pm  Virtual Visit Note Connected with patient by a video enabled telemedicine/telehealth application or telephone, with their informed consent, and verified patient privacy and that I am speaking with the correct person using two identifiers. I discussed the limitations, risks, security and privacy concerns of performing psychotherapy and management service by telephone and the availability of in person appointments. I also discussed with the patient that there may be a patient responsible charge related to this service. The patient expressed understanding and agreed to proceed. I discussed the treatment planning with the patient. The patient was provided an opportunity to ask questions and all were answered. The patient agreed with the plan and demonstrated an understanding of the instructions. The patient was advised to call  our office if  symptoms worsen or feel they are in a crisis state and need immediate contact.   Therapist Location: [facility] Patient Location: home   Treatment Type: Individual Therapy  Reported Symptoms: anxiety (personal, job, and family)  Mental Status Exam:  Appearance:   n/a   telehealth     Behavior:  Sharing  Motor:  n/a  telehealth  Speech/Language:   Normal Rate  Affect:  n/a  telehealth  Mood:  anxious  Thought process:  goal directed  Thought content:    WNL  Sensory/Perceptual disturbances:    WNL  Orientation:  oriented to person, place, time/date, situation, day of week, month of year and year  Attention:  Good  Concentration:  Good  Memory:  WNL  Fund of knowledge:   Good  Insight:    Good  Judgment:   Good  Impulse Control:  Good and Fair   Risk Assessment: Danger to Self:  No Self-injurious Behavior: No Danger to Others: No Duty to Warn:no Physical Aggression  / Violence:No  Access to Firearms a concern: No  Gang Involvement:No   Subjective: Patient making some progress in coping with multiple stressors personally, with his work, and regarding wife's BH-health issues.  Interventions: Cognitive Behavioral Therapy and Solution-Oriented/Positive Psychology  Diagnosis:   ICD-10-CM   1. Generalized anxiety disorder  F41.1      Plan: Patient not signing tx updates on computer screen due to COVID   Treatment Goals: Goals remain on tx planwhile patient works on strategies to meet his goals. Progress is documented each session in the "Progress" section on Plan.  Long Term Goal: Enhance the ability to handle effectively his depression and the full variety of life's anxieties.  Short Term Goal: Verbalize an understanding of how thoughts, physical feelings, and behavioral actions contribute to anxiety and anger and its treatment.  Strategy:  Patient will work to recognize and interrupt triggers to hisanxious or depressive thoughtsso as to replace with positive, more reality-based thoughts, which lead to more positive actions/behaviors.  PROGRESS): Patient today reports continued anxiety (personal, work-related, and wife's [place] issues).  He indicated that things were some better than at his last appt, but especially sensitive with his wife's situation.  Work had settled down some.  Continues to be alcohol-free and less volatile in his reactions to stress/conflict.  Work this session focused more on the strategy listed above in Tx Plan and also the short term goal of better recognizing and interrupting triggers to his anxious/frustrating thoughts so as to replace them with more positive, reality-based, and calming thoughts that  lead to better behavioral responses. To continue this between sessions.  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 Patient's motivation higher today.   Goal review and  progress noted with patient.  Next appt within 2-3 weeks.   [name], LCSW

## 2019-11-29 ENCOUNTER — Other Ambulatory Visit: Payer: Self-pay

## 2019-11-29 ENCOUNTER — Encounter (INDEPENDENT_AMBULATORY_CARE_PROVIDER_SITE_OTHER): Payer: BC Managed Care – PPO | Admitting: Ophthalmology

## 2019-11-29 DIAGNOSIS — H353111 Nonexudative age-related macular degeneration, right eye, early dry stage: Secondary | ICD-10-CM

## 2019-11-29 DIAGNOSIS — B399 Histoplasmosis, unspecified: Secondary | ICD-10-CM

## 2019-11-29 DIAGNOSIS — H348322 Tributary (branch) retinal vein occlusion, left eye, stable: Secondary | ICD-10-CM

## 2019-11-29 DIAGNOSIS — I10 Essential (primary) hypertension: Secondary | ICD-10-CM

## 2019-11-29 DIAGNOSIS — H35033 Hypertensive retinopathy, bilateral: Secondary | ICD-10-CM

## 2019-11-29 DIAGNOSIS — H353221 Exudative age-related macular degeneration, left eye, with active choroidal neovascularization: Secondary | ICD-10-CM | POA: Diagnosis not present

## 2019-11-29 DIAGNOSIS — H2513 Age-related nuclear cataract, bilateral: Secondary | ICD-10-CM

## 2019-11-29 DIAGNOSIS — H43813 Vitreous degeneration, bilateral: Secondary | ICD-10-CM

## 2019-12-03 ENCOUNTER — Encounter: Payer: Self-pay | Admitting: Family Medicine

## 2019-12-03 ENCOUNTER — Ambulatory Visit (INDEPENDENT_AMBULATORY_CARE_PROVIDER_SITE_OTHER): Payer: BC Managed Care – PPO | Admitting: Family Medicine

## 2019-12-03 ENCOUNTER — Other Ambulatory Visit: Payer: Self-pay

## 2019-12-03 VITALS — BP 130/88 | HR 69 | Temp 98.8°F | Resp 18 | Ht 68.0 in | Wt 164.6 lb

## 2019-12-03 DIAGNOSIS — F411 Generalized anxiety disorder: Secondary | ICD-10-CM

## 2019-12-03 DIAGNOSIS — G4730 Sleep apnea, unspecified: Secondary | ICD-10-CM | POA: Diagnosis not present

## 2019-12-03 DIAGNOSIS — E8881 Metabolic syndrome: Secondary | ICD-10-CM

## 2019-12-03 DIAGNOSIS — I10 Essential (primary) hypertension: Secondary | ICD-10-CM | POA: Diagnosis not present

## 2019-12-03 DIAGNOSIS — Z951 Presence of aortocoronary bypass graft: Secondary | ICD-10-CM

## 2019-12-03 DIAGNOSIS — R6882 Decreased libido: Secondary | ICD-10-CM

## 2019-12-03 DIAGNOSIS — E039 Hypothyroidism, unspecified: Secondary | ICD-10-CM

## 2019-12-03 DIAGNOSIS — I2510 Atherosclerotic heart disease of native coronary artery without angina pectoris: Secondary | ICD-10-CM | POA: Diagnosis not present

## 2019-12-03 DIAGNOSIS — E038 Other specified hypothyroidism: Secondary | ICD-10-CM

## 2019-12-03 DIAGNOSIS — E785 Hyperlipidemia, unspecified: Secondary | ICD-10-CM

## 2019-12-03 LAB — T3: T3, Total: 106 ng/dL (ref 76–181)

## 2019-12-03 NOTE — Progress Notes (Signed)
Subjective  CC:  Chief Complaint  Patient presents with  . Transitions Of Care    No refills needed at this time. Changed PCPs due to the other doctor leaving.     HPI: Shane [name] is a 54 y.o. male who presents to [place] at [place] today to establish care with me as a new patient.   He has the following concerns or needs:  Reviewed multiple records from chart and discussed medical problems in detail. Updated PL accordingly  HTN and CAD: recent cards eval reviewed. Well controlled BP. S/p CABG in past. Nl EF by most recent Echo. Last cath 2015. Stable. On carvedilol.   OSA on cpap  GAD: sees therapist. Improved from a year ago. On meds. No new concerns.  HLD on statin with well controlled readings last month. No AEs.  IR on long term metformin by last PCP  H/o subclinical hypothyroidism- no sxs now. Due for recheck   C/o low libido and possible ED. Worsened last year when increased lexapro and added mood meds. Married and happy but lack of sex life is negatively affecting relationship. No h/o ED in past.   Assessment  1. Essential hypertension   2. Coronary artery disease involving native coronary artery of native heart without angina pectoris   3. Sleep apnea in adult   4. GAD (generalized anxiety disorder)   5. S/P CABG (coronary artery bypass graft)   6. Subclinical hypothyroidism   7. Dyslipidemia, goal LDL below 70   8. Insulin resistance   9. Low libido      Plan   HTN and CAD: stable and controlled. Check renal function and electrolytes.  HLD: recheck non fasting lipids today with [facility]  GAD: per psych. Reports controlled.   Recheck thyroid levels  Pt prefers to continue on metformin. Will follow  Low libido and possible ED: educated. Likely multifactorial: vascular disease, mood meds side effects and possible low T. Due to heart disease and h/o GAD w/ anger issues, not a great T replacement candidate nor Viagra. To discuss  with psych. If persists, recommend checking testosterone levels and refer to urology for further eval/assistance. Patient understands and agrees with care plan.    Follow up:  No follow-ups on file. Orders Placed This Encounter  Procedures  . T4, free  . T3  . TSH  . Comprehensive metabolic panel  . CBC with Differential/Platelet  . Hemoglobin A1c   No orders of the defined types were placed in this encounter.    Depression screen [facility] 2/9 12/03/2019 04/03/2019 02/27/2019 11/29/2018 09/05/2018  Decreased Interest 0 0 1 0 0  Down, Depressed, Hopeless 0 1 1 0 0  PHQ - 2 Score 0 1 2 0 0  Altered sleeping 0 0 0 0 1  Tired, decreased energy 0 1 0 0 0  Change in appetite 0 1 0 0 0  Feeling bad or failure about yourself  0 1 1 0 0  Trouble concentrating 1 0 0 0 0  Moving slowly or fidgety/restless 0 0 1 0 1  Suicidal thoughts 0 0 0 0 0  PHQ-9 Score 1 4 4  0 2  Difficult doing work/chores Not difficult at all Not difficult at all Somewhat difficult Not difficult at all Not difficult at all    We updated and reviewed the patient's past history in detail and it is documented below.  Patient Active Problem List   Diagnosis Date Noted  . CAD (coronary  artery disease) 12/03/2019  . Essential hypertension 07/24/2019    Essential hypertension   . Alcohol use disorder, mild, abuse 03/25/2019  . Sleep apnea in adult 09/07/2018  . GAD (generalized anxiety disorder) 05/25/2018  . Depression, major, single episode, in partial remission ([place]) 05/25/2018  . S/P CABG (coronary artery bypass graft) 11/16/2017    CAD   . Insulin resistance 11/16/2017  . Subclinical hypothyroidism 11/16/2017  . Macular degeneration   . PVC's (premature ventricular contractions) 09/11/2014    PVCs   . Dyslipidemia, goal LDL below 70 05/24/2014    Hyperlipidemia   . Hodgkin disease ([place]) 07/13/1995   Health Maintenance  Topic Date Due  . INFLUENZA VACCINE  02/10/2020  . COLONOSCOPY  11/04/2025  .  TETANUS/TDAP  11/17/2027  . COVID-19 Vaccine  Completed  . HIV Screening  Completed   Immunization History  Administered Date(s) Administered  . Influenza,inj,Quad PF,6+ Mos 04/03/2018, 04/03/2019  . PFIZER SARS-COV-2 Vaccination 10/22/2019, 11/12/2019  . Tdap 11/16/2017     Allergies: Patient has No Known Allergies. Past Medical History Patient  has a past medical history of CAD (coronary artery disease), s/p stent 2011 and CABG 2015 [facility]), Hodgkin disease [facility]) (1997), Hyperlipidemia, Macular degeneration, and PVC's (premature ventricular contractions). Past Surgical History Patient  has a past surgical history that includes Coronary artery bypass graft (02/2014) and Cardiac catheterization (08/2009). Family History: Patient family history includes COPD in his father and mother; Cancer in his maternal grandmother and mother; Heart Problems in his brother and brother; Heart attack in his father; Heart disease in his father; Stroke in his mother. Social History:  Patient  reports that he has never smoked. He has quit using smokeless tobacco. He reports current alcohol use of about 3.0 standard drinks of alcohol per week. He reports that he does not use drugs.  Review of Systems: Constitutional: negative for fever or malaise Ophthalmic: negative for photophobia, double vision or loss of vision Cardiovascular: negative for chest pain, dyspnea on exertion, or new LE swelling Respiratory: negative for SOB or persistent cough Gastrointestinal: negative for abdominal pain, change in bowel habits or melena Genitourinary: negative for dysuria or gross hematuria Musculoskeletal: negative for new gait disturbance or muscular weakness Integumentary: negative for new or persistent rashes Neurological: negative for TIA or stroke symptoms Psychiatric: negative for SI or delusions Allergic/Immunologic: negative for hives  Patient Care Team    Relationship Specialty Notifications  Start End  Leamon Arnt, MD PCP - General Family Medicine All results, Admissions 12/03/19   Lorretta Harp, MD Consulting Physician Cardiology  11/16/17   Hayden Pedro, MD Consulting Physician Ophthalmology  11/16/17   Volanda Napoleon, MD Consulting Physician Oncology  11/16/17     Objective  Vitals: BP 130/88   Pulse 69   Temp 98.8 F (37.1 C) (Temporal)   Resp 18   Ht 5\' 8"  (1.727 m)   Wt 164 lb 9.6 oz (74.7 kg)   SpO2 97%   BMI 25.03 kg/m  General:  Well developed, well nourished, no acute distress  Psych:  Alert and oriented,normal mood and affect HEENT:  Normocephalic, atraumatic, non-icteric sclera, supple neck without adenopathy, mass or thyromegaly Cardiovascular:  RRR without gallop, rub or murmur Respiratory:  Good breath sounds bilaterally, CTAB with normal respiratory effort Neurologic:    Mental status is normal.  Normal gait   Commons side effects, risks, benefits, and alternatives for medications and treatment plan prescribed today were discussed, and the patient expressed  understanding of the given instructions. Patient is instructed to call or message via MyChart if he/she has any questions or concerns regarding our treatment plan. No barriers to understanding were identified. We discussed Red Flag symptoms and signs in detail. Patient expressed understanding regarding what to do in case of urgent or emergency type symptoms.   Medication list was reconciled, printed and provided to the patient in AVS. Patient instructions and summary information was reviewed with the patient as documented in the AVS. This note was prepared with assistance of Dragon voice recognition software. Occasional wrong-word or sound-a-like substitutions may have occurred due to the inherent limitations of voice recognition software  This visit occurred during the SARS-CoV-2 public health emergency.  Safety protocols were in place, including screening questions prior to the visit, additional  usage of staff PPE, and extensive cleaning of exam room while observing appropriate contact time as indicated for disinfecting solutions.

## 2019-12-03 NOTE — Patient Instructions (Signed)
Please return in 6 months for your annual complete physical; please come fasting.  I will release your lab results to you on your MyChart account with further instructions. Please reply with any questions.   It was a pleasure meeting you today! Thank you for choosing [place] to meet your healthcare needs! I truly look forward to working with you. If you have any questions or concerns, please send me a message via Mychart or call the office at [phone].

## 2019-12-04 ENCOUNTER — Encounter: Payer: Self-pay | Admitting: Family Medicine

## 2019-12-04 LAB — CBC WITH DIFFERENTIAL/PLATELET
Basophils Absolute: 0.1 10*3/uL (ref 0.0–0.1)
Basophils Relative: 1.1 % (ref 0.0–3.0)
Eosinophils Absolute: 0.1 10*3/uL (ref 0.0–0.7)
Eosinophils Relative: 1.6 % (ref 0.0–5.0)
HCT: 40.3 % (ref 39.0–52.0)
Hemoglobin: 13.7 g/dL (ref 13.0–17.0)
Lymphocytes Relative: 17.8 % (ref 12.0–46.0)
Lymphs Abs: 1.3 10*3/uL (ref 0.7–4.0)
MCHC: 33.9 g/dL (ref 30.0–36.0)
MCV: 94.9 fl (ref 78.0–100.0)
Monocytes Absolute: 0.5 10*3/uL (ref 0.1–1.0)
Monocytes Relative: 7.5 % (ref 3.0–12.0)
Neutro Abs: 5.1 10*3/uL (ref 1.4–7.7)
Neutrophils Relative %: 72 % (ref 43.0–77.0)
Platelets: 224 10*3/uL (ref 150.0–400.0)
RBC: 4.24 Mil/uL (ref 4.22–5.81)
RDW: 13.6 % (ref 11.5–15.5)
WBC: 7.1 10*3/uL (ref 4.0–10.5)

## 2019-12-04 LAB — COMPREHENSIVE METABOLIC PANEL
ALT: 69 U/L — ABNORMAL HIGH (ref 0–53)
AST: 28 U/L (ref 0–37)
Albumin: 4.7 g/dL (ref 3.5–5.2)
Alkaline Phosphatase: 120 U/L — ABNORMAL HIGH (ref 39–117)
BUN: 16 mg/dL (ref 6–23)
CO2: 31 mEq/L (ref 19–32)
Calcium: 10 mg/dL (ref 8.4–10.5)
Chloride: 101 mEq/L (ref 96–112)
Creatinine, Ser: 0.9 mg/dL (ref 0.40–1.50)
GFR: 87.6 mL/min (ref >60.00)
Glucose, Bld: 97 mg/dL (ref 70–99)
Potassium: 4.7 mEq/L (ref 3.5–5.1)
Sodium: 140 mEq/L (ref 135–145)
Total Bilirubin: 1.2 mg/dL (ref 0.2–1.2)
Total Protein: 6.6 g/dL (ref 6.0–8.3)

## 2019-12-04 LAB — T4, FREE: Free T4: 0.63 ng/dL (ref 0.60–1.60)

## 2019-12-04 LAB — HEMOGLOBIN A1C: Hgb A1c MFr Bld: 5.8 % (ref 4.6–6.5)

## 2019-12-04 LAB — TSH: TSH: 7.21 u[IU]/mL — ABNORMAL HIGH (ref 0.35–4.50)

## 2019-12-04 NOTE — Progress Notes (Signed)
Error MyChart note is wrong

## 2019-12-13 ENCOUNTER — Other Ambulatory Visit: Payer: Self-pay

## 2019-12-13 ENCOUNTER — Ambulatory Visit (INDEPENDENT_AMBULATORY_CARE_PROVIDER_SITE_OTHER): Payer: BC Managed Care – PPO | Admitting: Psychiatry

## 2019-12-13 DIAGNOSIS — F411 Generalized anxiety disorder: Secondary | ICD-10-CM | POA: Diagnosis not present

## 2019-12-13 NOTE — Progress Notes (Signed)
      [facility] Counselor/Therapist Progress Note  Patient ID: Shane [name], MRN: [id number],    Date: 12/13/2019  Time Spent: 60 minutes  10:00am to 11:00am  Treatment Type: Individual Therapy  Reported Symptoms: anxiety (mostly related to situations with wife and work);  Still alcohol-free except 3 beers on his birthday.  Did not follow up with marital therapy (wife did not follow through) "but things have been some better"  Mental Status Exam:  Appearance:   Casual     Behavior:  Appropriate and Sharing  Motor:  Normal  Speech/Language:   Normal Rate  Affect:  anxiety  Mood:  anxious  Thought process:  goal directed  Thought content:    WNL  Sensory/Perceptual disturbances:    WNL  Orientation:  oriented to person, place, time/date, situation, day of week, month of year and year  Attention:  Good  Concentration:  Good and Fair  Memory:  WNL  Fund of knowledge:   Good  Insight:    Good  Judgment:   Good  Impulse Control:  Good   Risk Assessment: Danger to Self:  No Self-injurious Behavior: No Danger to Others: No Duty to Warn:no Physical Aggression / Violence:No  Access to Firearms a concern: No  Gang Involvement:No   Subjective: Patient today reports anxiety at home, " but is some better."  Also anxiety re: undetermined return to work.  Interventions: Solution-Oriented/Positive Psychology and Ego-Supportive  Diagnosis:   ICD-10-CM   1. Generalized anxiety disorder  F41.1     Plan: Patient not signing tx updates on computer screen due to COVID   Treatment Goals: Goals remain on tx planwhile patient works on strategies to meet his goals. Progress is documented each session in the "Progress" section on Plan.  Long Term Goal: Enhance the ability to handle effectively his depression and the full variety of life's anxieties.  Short Term Goal: Verbalize an understanding of how thoughts, physical feelings, and behavioral actions contribute to  anxiety and anger and its treatment.  Strategy:  Patient will work to recognize and interrupt triggers to hisanxious or depressive thoughtsso as to replace with positive, more reality-based thoughts, which lead to more positive actions/behaviors.  PROGRESS): Patient in today reporting anxiety at home with wife and the unknowns of returning to work on June 21st.  Sleep is really good.  Has made some progress in relationship with wife but they still work at it "on their own" for now.  Feel "like I still have to walk on eggshells around wife but it is not as bad as it was, I can relax a bit more." Wife did mention them needing "more intimacy and they plan to talk more today about that.  Patient plans to ask her about them following up with marital therapy as originally planned. Continues to be alcohol-free "except for allowing himself to have 3 beers on birthday weekend". Still experiences times of increased stress where he can be more volatile in his actions/reactions, so we reviewed calming/talking-through strategies along with being more mindful of his behavior in the moment and self-talk that can be empowering and help him negotiate stress in less volatile ways. He is improving in his stress management. Will pick up on this next session.   Goal review and progress noted with patient.  Next appt within 3 weeks.   [name], LCSW

## 2020-01-03 ENCOUNTER — Encounter (INDEPENDENT_AMBULATORY_CARE_PROVIDER_SITE_OTHER): Payer: BC Managed Care – PPO | Admitting: Ophthalmology

## 2020-01-03 ENCOUNTER — Other Ambulatory Visit: Payer: Self-pay

## 2020-01-03 ENCOUNTER — Ambulatory Visit: Payer: BC Managed Care – PPO | Admitting: Psychiatry

## 2020-01-03 DIAGNOSIS — H353111 Nonexudative age-related macular degeneration, right eye, early dry stage: Secondary | ICD-10-CM | POA: Diagnosis not present

## 2020-01-03 DIAGNOSIS — B399 Histoplasmosis, unspecified: Secondary | ICD-10-CM

## 2020-01-03 DIAGNOSIS — I10 Essential (primary) hypertension: Secondary | ICD-10-CM | POA: Diagnosis not present

## 2020-01-03 DIAGNOSIS — H43813 Vitreous degeneration, bilateral: Secondary | ICD-10-CM

## 2020-01-03 DIAGNOSIS — H35033 Hypertensive retinopathy, bilateral: Secondary | ICD-10-CM

## 2020-01-03 DIAGNOSIS — H2513 Age-related nuclear cataract, bilateral: Secondary | ICD-10-CM

## 2020-01-03 DIAGNOSIS — H348322 Tributary (branch) retinal vein occlusion, left eye, stable: Secondary | ICD-10-CM | POA: Diagnosis not present

## 2020-01-03 DIAGNOSIS — H353221 Exudative age-related macular degeneration, left eye, with active choroidal neovascularization: Secondary | ICD-10-CM | POA: Diagnosis not present

## 2020-01-08 ENCOUNTER — Other Ambulatory Visit: Payer: Self-pay | Admitting: Adult Health

## 2020-01-08 DIAGNOSIS — F411 Generalized anxiety disorder: Secondary | ICD-10-CM

## 2020-01-08 DIAGNOSIS — F321 Major depressive disorder, single episode, moderate: Secondary | ICD-10-CM

## 2020-01-08 DIAGNOSIS — F331 Major depressive disorder, recurrent, moderate: Secondary | ICD-10-CM

## 2020-01-08 DIAGNOSIS — F39 Unspecified mood [affective] disorder: Secondary | ICD-10-CM

## 2020-01-21 ENCOUNTER — Ambulatory Visit (INDEPENDENT_AMBULATORY_CARE_PROVIDER_SITE_OTHER): Payer: BC Managed Care – PPO | Admitting: Psychiatry

## 2020-01-21 ENCOUNTER — Other Ambulatory Visit: Payer: Self-pay

## 2020-01-21 DIAGNOSIS — F411 Generalized anxiety disorder: Secondary | ICD-10-CM

## 2020-01-21 NOTE — Progress Notes (Signed)
      [facility] Counselor/Therapist Progress Note  Patient ID: Shane [name], MRN: [id number],    Date: 01/21/2020  Time Spent: 60 minutes   11:00am to 12:00noon  Treatment Type: Individual Therapy  Reported Symptoms: anxiety, some anger, confusion  Mental Status Exam:  Appearance:   Casual     Behavior:  Appropriate, Sharing and Motivated  Motor:  Normal  Speech/Language:   Clear and Coherent  Affect:  anxious  Mood:  anxious  Thought process:  goal directed  Thought content:    WNL  Sensory/Perceptual disturbances:    WNL  Orientation:  oriented to person, place, time/date, situation, day of week, month of year and year  Attention:  Good  Concentration:  Good  Memory:  WNL  Fund of knowledge:   Good  Insight:    Good  Judgment:   Good  Impulse Control:  Good   Risk Assessment: Danger to Self:  No Self-injurious Behavior: No Danger to Others: No Duty to Warn:no Physical Aggression / Violence:No  Access to Firearms a concern: No  Gang Involvement:No   Subjective: Patient reports having felt better til past 2-3 weeks with wife questioning their marriage, and also wife's instability with her bipolar symptoms more recently.   Interventions: Cognitive Behavioral Therapy and Solution-Oriented/Positive Psychology  Diagnosis:   ICD-10-CM   1. Generalized anxiety disorder  F41.1     Plan: Patient not signing tx updates on computer screen due to COVID   Treatment Goals: Goals remain on tx planwhile patient works on strategies to meet his goals. Progress is documented each session in the "Progress" section on Plan.  Long Term Goal: Enhance the ability to handle effectively his depression and the full variety of life's anxieties.  Short Term Goal: Verbalize an understanding of how thoughts, physical feelings, and behavioral actions contribute to anxiety and anger and its treatment.  Strategy:  Patient will work to recognize and interrupt triggers to  hisanxious or depressive thoughtsso as to replace with positive, more reality-based thoughts, which lead to more positive actions/behaviors.  PROGRESS): Patient in today reporting anxiety, some anger, and confusion relating to marriage.  Patient feeling drained and exhausted.  Needing the session today to vent his frustrations rather than acting impulsively, and to also think through how he wants to approach his wife. Denies any SI. States he did drink some alcohol July 4th holiday but stil very controlled and no overdrinking.  Managing stress is more of a challenge with wife being more difficult to interact with recently.  Feels like nothing he does can satisfy her (when her emotional status is not good). Feels he may be overdoing trying to "satsfy" her at time when maybe the best strategy for him to use is to give her some space and time. He shared the possible benefits in doing this, since other things he has tried seem make her more annoyed and frustrated.  Reviewed short term goal above and he is focus more on it and positive self-care (physical and emotional)  between sessions, along with more positive self-talk.  Goal review and progress/challenges noted with patient.  Next appt within 2-3 weeks.   [name], LCSW

## 2020-01-24 ENCOUNTER — Other Ambulatory Visit: Payer: Self-pay

## 2020-01-24 ENCOUNTER — Telehealth: Payer: Self-pay | Admitting: Adult Health

## 2020-01-24 ENCOUNTER — Other Ambulatory Visit: Payer: Self-pay | Admitting: Adult Health

## 2020-01-24 DIAGNOSIS — F39 Unspecified mood [affective] disorder: Secondary | ICD-10-CM

## 2020-01-24 DIAGNOSIS — F321 Major depressive disorder, single episode, moderate: Secondary | ICD-10-CM

## 2020-01-24 DIAGNOSIS — F331 Major depressive disorder, recurrent, moderate: Secondary | ICD-10-CM

## 2020-01-24 DIAGNOSIS — F411 Generalized anxiety disorder: Secondary | ICD-10-CM

## 2020-01-24 MED ORDER — ARIPIPRAZOLE 5 MG PO TABS: ORAL_TABLET | ORAL | 0 refills | Status: DC

## 2020-01-24 MED ORDER — ESCITALOPRAM OXALATE 20 MG PO TABS: 20.0000 mg | ORAL_TABLET | Freq: Every day | ORAL | 0 refills | Status: DC

## 2020-01-24 NOTE — Telephone Encounter (Signed)
Rx's sent, patient needs to keep scheduled apt

## 2020-01-24 NOTE — Telephone Encounter (Signed)
Pt made appt for 7/19. Pt would like a refill on Abilify 5mg , and Lexapro. Please send to [facility] on [address].

## 2020-01-28 ENCOUNTER — Encounter: Payer: Self-pay | Admitting: Adult Health

## 2020-01-28 ENCOUNTER — Other Ambulatory Visit: Payer: Self-pay

## 2020-01-28 ENCOUNTER — Ambulatory Visit (INDEPENDENT_AMBULATORY_CARE_PROVIDER_SITE_OTHER): Payer: BC Managed Care – PPO | Admitting: Adult Health

## 2020-01-28 DIAGNOSIS — F39 Unspecified mood [affective] disorder: Secondary | ICD-10-CM

## 2020-01-28 DIAGNOSIS — F411 Generalized anxiety disorder: Secondary | ICD-10-CM | POA: Diagnosis not present

## 2020-01-28 DIAGNOSIS — F331 Major depressive disorder, recurrent, moderate: Secondary | ICD-10-CM

## 2020-01-28 DIAGNOSIS — F321 Major depressive disorder, single episode, moderate: Secondary | ICD-10-CM | POA: Diagnosis not present

## 2020-01-28 MED ORDER — ESCITALOPRAM OXALATE 20 MG PO TABS: 20.0000 mg | ORAL_TABLET | Freq: Every day | ORAL | 1 refills | Status: DC

## 2020-01-28 MED ORDER — ARIPIPRAZOLE 5 MG PO TABS: ORAL_TABLET | ORAL | 1 refills | Status: DC

## 2020-01-28 NOTE — Progress Notes (Signed)
Shane [name] [id number] May 27, 1966 54 y.o.  Subjective:   Patient ID:  Shane [name] is a 54 y.o. (DOB [birth year]) male.  Chief Complaint: No chief complaint on file.   HPI BAYLEN [name] presents to the office today for follow-up of GAD, MDD, insomnia, mood disorder, panic attacks.   Describes mood today as "ok". Pleasant. Mood symptoms - denies depression, anxiety, and irritability. Denies mood instability - "I'm staying level". Stating "everything is going good". He and wife doing well. Stable interest and motivation. Taking medications as prescribed.  Energy levels stable. Active, does not have a regular exercise routine. Works full-time in Engineer, technical sales. Enjoys some usual interests and activities. Married. Lives with wife of 3 years. Has 2 grown sons. Family in [place].  Appetite adequate. Weight stable - 161 pounds.  Sleeps well most nights. Averages 8 hours. Using CPAP machine. Focus and concentration "good". Completing tasks. Managing aspects of household. Work going well - back in the office - modified schedule - 3 in the office and 2 days from home. Denies SI or HI. Denies AH or VH.  GAD-7     Office Visit from 05/24/2018 in [place] Visit from 04/03/2018 in [place]  Total GAD-7 Score 2 9    PHQ2-9     Office Visit from 12/03/2019 in [place] Visit from 04/03/2019 in [place] Visit from 02/27/2019 in [place] Visit from 11/29/2018 in [place] Visit from 09/05/2018 in [place]  PHQ-2 Total Score 0 1 2 0 0  PHQ-9 Total Score 1 4 4  0 2       Review of Systems:  Review of Systems  Musculoskeletal: Negative for gait problem.  Neurological: Negative for tremors.  Psychiatric/Behavioral:       Please refer to HPI    Medications: I have reviewed the patient's  current medications.    Medication Side Effects: None  Allergies: No Known Allergies  Past Medical History:  Diagnosis Date  . CAD (coronary artery disease), s/p stent 2011 and CABG 2015 [facility])   . Hodgkin disease ([place]) 1997  . Hyperlipidemia   . Macular degeneration   . PVC's (premature ventricular contractions)     Family History  Problem Relation Age of Onset  . Stroke Mother   . Cancer Mother   . COPD Mother   . Heart disease Father   . COPD Father   . Heart attack Father   . Heart Problems Brother        heart valve replaced at 4 years old  . Heart Problems Brother        MI at age 109; CABG at age 12  . Cancer Maternal Grandmother   . Colon cancer Neg Hx     Social History   Socioeconomic History  . Marital status: Married    Spouse name: Not on file  . Number of children: Not on file  . Years of education: Not on file  . Highest education level: Not on file  Occupational History  . Not on file  Tobacco Use  . Smoking status: Never Smoker  . Smokeless tobacco: Former Systems developer  . Tobacco comment: a long time ago  Vaping Use  . Vaping Use: Never used  Substance and Sexual Activity  . Alcohol use: Yes    Alcohol/week: 3.0 standard drinks    Types: 3 Cans of beer  per week    Comment: sometimes 3 beers daily  . Drug use: No  . Sexual activity: Not on file    Comment: Not reported  Other Topics Concern  . Not on file  Social History Narrative  . Not on file   Social Determinants of Health   Financial Resource Strain:   . Difficulty of Paying Living Expenses:   Food Insecurity:   . Worried About Charity fundraiser in the Last Year:   . Arboriculturist in the Last Year:   Transportation Needs:   . Film/video editor (Medical):   Marland Kitchen Lack of Transportation (Non-Medical):   Physical Activity:   . Days of Exercise per Week:   . Minutes of Exercise per Session:   Stress:   . Feeling of Stress :   Social Connections:   . Frequency  of Communication with Friends and Family:   . Frequency of Social Gatherings with Friends and Family:   . Attends Religious Services:   . Active Member of Clubs or Organizations:   . Attends Archivist Meetings:   Marland Kitchen Marital Status:   Intimate Partner Violence:   . Fear of Current or Ex-Partner:   . Emotionally Abused:   Marland Kitchen Physically Abused:   . Sexually Abused:     Past Medical History, Surgical history, Social history, and Family history were reviewed and updated as appropriate.   Please see review of systems for further details on the patient's review from today.   Objective:   Physical Exam:  There were no vitals taken for this visit.  Physical Exam Constitutional:      General: He is not in acute distress. Musculoskeletal:        General: No deformity.  Neurological:     Mental Status: He is alert and oriented to person, place, and time.     Coordination: Coordination normal.  Psychiatric:        Attention and Perception: Attention and perception normal. He does not perceive auditory or visual hallucinations.        Mood and Affect: Mood normal. Mood is not anxious or depressed. Affect is not labile, blunt, angry or inappropriate.        Speech: Speech normal.        Behavior: Behavior normal.        Thought Content: Thought content normal. Thought content is not paranoid or delusional. Thought content does not include homicidal or suicidal ideation. Thought content does not include homicidal or suicidal plan.        Cognition and Memory: Cognition and memory normal.        Judgment: Judgment normal.     Comments: Insight intact     Lab Review:     Component Value Date/Time   NA 140 12/03/2019 1523   NA 141 12/24/2015 1051   K 4.7 12/03/2019 1523   K 4.6 12/24/2015 1051   CL 101 12/03/2019 1523   CO2 31 12/03/2019 1523   CO2 28 12/24/2015 1051   GLUCOSE 97 12/03/2019 1523   GLUCOSE 95 12/24/2015 1051   BUN 16 12/03/2019 1523   BUN 14.6 12/24/2015  1051   CREATININE 0.90 12/03/2019 1523   CREATININE 1.1 12/24/2015 1051   CALCIUM 10.0 12/03/2019 1523   CALCIUM 9.8 12/24/2015 1051   PROT 6.6 12/03/2019 1523   PROT 7.1 10/19/2019 1146   PROT 7.6 12/24/2015 1051   ALBUMIN 4.7 12/03/2019 1523   ALBUMIN 4.8 10/19/2019 1146  ALBUMIN 4.5 12/24/2015 1051   AST 28 12/03/2019 1523   AST 25 12/24/2015 1051   ALT 69 (H) 12/03/2019 1523   ALT 34 12/24/2015 1051   ALKPHOS 120 (H) 12/03/2019 1523   ALKPHOS 45 12/24/2015 1051   BILITOT 1.2 12/03/2019 1523   BILITOT 1.3 (H) 10/19/2019 1146   BILITOT 0.99 12/24/2015 1051       Component Value Date/Time   WBC 7.1 12/03/2019 1523   RBC 4.24 12/03/2019 1523   HGB 13.7 12/03/2019 1523   HGB 15.7 12/24/2015 1050   HCT 40.3 12/03/2019 1523   HCT 44.7 12/24/2015 1050   PLT 224.0 12/03/2019 1523   PLT 263 12/24/2015 1050   MCV 94.9 12/03/2019 1523   MCV 93 12/24/2015 1050   MCH 32.6 12/24/2015 1050   MCHC 33.9 12/03/2019 1523   RDW 13.6 12/03/2019 1523   RDW 12.9 12/24/2015 1050   LYMPHSABS 1.3 12/03/2019 1523   LYMPHSABS 1.3 12/24/2015 1050   MONOABS 0.5 12/03/2019 1523   EOSABS 0.1 12/03/2019 1523   EOSABS 0.1 12/24/2015 1050   BASOSABS 0.1 12/03/2019 1523   BASOSABS 0.0 12/24/2015 1050    No results found for: POCLITH, LITHIUM   No results found for: PHENYTOIN, PHENOBARB, VALPROATE, CBMZ   .res Assessment: Plan:    Plan  1. Continue Lexapro 20mg  daily 2. Continue Ativan 0.5mg  BID - takes occasionally. 3. Continue Abilify 5mg  daily.  Continue therapy with Rinaldo Cloud.   RTC 6 months  Patient advised to contact office with any questions, adverse effects, or acute worsening in signs and symptoms.  Discussed potential benefits, risk, and side effects of benzodiazepines to include potential risk of tolerance and dependence, as well as possible drowsiness.  Advised patient not to drive if experiencing drowsiness and to take lowest possible effective dose to minimize risk of  dependence and tolerance.   Discussed potential metabolic side effects associated with atypical antipsychotics, as well as potential risk for movement side effects. Advised pt to contact office if movement side effects occur.  Diagnoses and all orders for this visit:   Diagnoses and all orders for this visit:  Depression, major, single episode, moderate ([place]) Comments: Marriage issues. Wife with mood disorder. Orders: -     escitalopram (LEXAPRO) 20 MG tablet; Take 1 tablet (20 mg total) by mouth daily.  Generalized anxiety disorder -     ARIPiprazole (ABILIFY) 5 MG tablet; Take one tablet daily.  Major depressive disorder, recurrent episode, moderate ([facility]) -     ARIPiprazole (ABILIFY) 5 MG tablet; Take one tablet daily.  Episodic mood disorder ([facility]) -     ARIPiprazole (ABILIFY) 5 MG tablet; Take one tablet daily.     Please see After Visit Summary for patient specific instructions.  Future Appointments  Date Time Provider [place]  02/07/2020  8:30 AM Hayden Pedro, MD [facility] None  02/11/2020 12:00 PM [name], LCSW CP[facility]CP None  03/03/2020  8:00 AM [name], LCSW [facility]CP None  03/24/2020  8:00 AM [name], LCSW CP[facility]CP None  06/04/2020 10:00 AM Leamon Arnt, MD [facility]  07/30/2020  8:00 AM Clydette Privitera, Berdie Ogren, NP CP[facility]CP None  09/02/2020  9:00 AM Ward Givens, NP [facility] None    No orders of the defined types were placed in this encounter.   -------------------------------

## 2020-02-07 ENCOUNTER — Other Ambulatory Visit: Payer: Self-pay

## 2020-02-07 ENCOUNTER — Encounter (INDEPENDENT_AMBULATORY_CARE_PROVIDER_SITE_OTHER): Payer: BC Managed Care – PPO | Admitting: Ophthalmology

## 2020-02-07 DIAGNOSIS — H353111 Nonexudative age-related macular degeneration, right eye, early dry stage: Secondary | ICD-10-CM | POA: Diagnosis not present

## 2020-02-07 DIAGNOSIS — I10 Essential (primary) hypertension: Secondary | ICD-10-CM

## 2020-02-07 DIAGNOSIS — H35033 Hypertensive retinopathy, bilateral: Secondary | ICD-10-CM

## 2020-02-07 DIAGNOSIS — H348322 Tributary (branch) retinal vein occlusion, left eye, stable: Secondary | ICD-10-CM

## 2020-02-07 DIAGNOSIS — H353221 Exudative age-related macular degeneration, left eye, with active choroidal neovascularization: Secondary | ICD-10-CM | POA: Diagnosis not present

## 2020-02-07 DIAGNOSIS — H43813 Vitreous degeneration, bilateral: Secondary | ICD-10-CM

## 2020-02-11 ENCOUNTER — Other Ambulatory Visit: Payer: Self-pay

## 2020-02-11 ENCOUNTER — Ambulatory Visit (INDEPENDENT_AMBULATORY_CARE_PROVIDER_SITE_OTHER): Payer: BC Managed Care – PPO | Admitting: Psychiatry

## 2020-02-11 DIAGNOSIS — F411 Generalized anxiety disorder: Secondary | ICD-10-CM | POA: Diagnosis not present

## 2020-02-11 NOTE — Progress Notes (Signed)
[facility] Counselor/Therapist Progress Note  Patient ID: Shane [name], MRN: [id number],    Date: 02/11/2020  Time Spent: 60 minutes   12:00noon to 1:00pm  Treatment Type: Individual Therapy  Reported Symptoms: anxious  Mental Status Exam:  Appearance:   Casual     Behavior:  Appropriate and Sharing  Motor:  Normal  Speech/Language:   Clear and Coherent  Affect:  anxious  Mood:  anxious  Thought process:  normal  Thought content:    WNL  Sensory/Perceptual disturbances:    WNL  Orientation:  oriented to person, place, time/date, situation, day of week, month of year and year  Attention:  Good  Concentration:  Good  Memory:  WNL  Fund of knowledge:   Good  Insight:    Good  Judgment:   Good  Impulse Control:  Good   Risk Assessment: Danger to Self:  No Self-injurious Behavior: No Danger to Others: No Duty to Warn:no Physical Aggression / Violence:No  Access to Firearms a concern: No  Gang Involvement:No   Subjective: Patient today reports anxiety and it has lessened some.  No depression.  Interventions: Cognitive Behavioral Therapy and Solution-Oriented/Positive Psychology  Diagnosis:   ICD-10-CM   1. Generalized anxiety disorder  F41.1     Plan: Patient not signing tx updates on computer screen due to COVID   Treatment Goals: Goals remain on tx planwhile patient works on strategies to meet his goals. Progress is documented each session in the "Progress" section on Plan.  Long Term Goal: Enhance the ability to handle effectively his depression and the full variety of life's anxieties.  Short Term Goal: Verbalize an understanding of how thoughts, physical feelings, and behavioral actions contribute to anxiety and anger and its treatment.  Strategy:  Patient will work to recognize and interrupt triggers to hisanxious or depressive thoughtsso as to replace with positive, more reality-based thoughts, which lead to more positive  actions/behaviors.  PROGRESS): Patient in today reporting anxiety that is lessening some as "I'm learning to be more aware of my anxiety and trying to manage it better, doesn't always work but am improving." Patient discusses the issues he is having with wife (bipolar) more recently involving communication and this seems to help both he and wife. Not as angry today nor more recently. Energy seems to be some better. Picking up from last session, he is working on his actions and responses, trying not to be impulsive and thinking through his behaviors more with intentionality. Is still limiting his alcohol intake and no episodes of over-drinking. Work has gone to a hybrid model and he likes that.  Wife seems to have been a little less leveled out more recently, and more irritable at times in conversations with patient.  Patient reports he has been trying to use the communication strategies we've discussed in sessions, and asks if we can review them today, which we did, especially paying attention to how he says what he says, knowing when no response is the better response, and listening to really hear the other person versus listening just to respond. Wants to try and have more time together and hopes to get wife to agree to a short trip away from home sometime within next couple months. To also focus on his own self-care, continue the limited alcohol intake, and work on more positive feelings about the changes he has made through positive self-talk.  Goal review and progress noted with patient.  Next appt within 3 weeks.  [name], LCSW

## 2020-02-28 DIAGNOSIS — G4733 Obstructive sleep apnea (adult) (pediatric): Secondary | ICD-10-CM | POA: Diagnosis not present

## 2020-03-03 ENCOUNTER — Ambulatory Visit (INDEPENDENT_AMBULATORY_CARE_PROVIDER_SITE_OTHER): Payer: BC Managed Care – PPO | Admitting: Psychiatry

## 2020-03-03 ENCOUNTER — Other Ambulatory Visit: Payer: Self-pay

## 2020-03-03 DIAGNOSIS — F411 Generalized anxiety disorder: Secondary | ICD-10-CM

## 2020-03-03 NOTE — Progress Notes (Signed)
[facility] Counselor/Therapist Progress Note  Patient ID: DAXTON NYDAM, MRN: [id number],    Date: 03/03/2020  Time Spent:  60 minutes   8:00am to 9:00am  Treatment Type: Individual Therapy  Reported Symptoms: anxiety  Mental Status Exam:  Appearance:   Casual     Behavior:  Appropriate, Sharing and Motivated  Motor:  Normal  Speech/Language:   Clear and Coherent  Affect:  anxious  Mood:  anxious  Thought process:  goal directed  Thought content:    WNL  Sensory/Perceptual disturbances:    WNL  Orientation:  oriented to person, place, time/date, situation, day of week, month of year and year  Attention:  Good  Concentration:  Good  Memory:  WNL  Fund of knowledge:   Good  Insight:    Good  Judgment:   Good  Impulse Control:  Good   Risk Assessment: Danger to Self:  No Self-injurious Behavior: No Danger to Others: No Duty to Warn:no Physical Aggression / Violence:No  Access to Firearms a concern: No  Gang Involvement:No   Subjective: Patient today reports anxiety and continued problems in marital relationship).     Interventions: Solution-Oriented/Positive Psychology  Diagnosis:   ICD-10-CM   1. Generalized anxiety disorder  F41.1      Plan: Patient not signing tx updates on computer screen due to COVID   Treatment Goals: Goals remain on tx planwhile patient works on strategies to meet his goals. Progress is documented each session in the "Progress" section on Plan.  Long Term Goal: Enhance the ability to handle effectively his depression and the full variety of life's anxieties.  Short Term Goal: Verbalize an understanding of how thoughts, physical feelings, and behavioral actions contribute to anxiety and anger and its treatment.  Strategy:  Patient will work to recognize and interrupt triggers to hisanxious or depressive thoughtsso as to replace with positive, more reality-based thoughts, which lead to more positive  actions/behaviors.  PROGRESS): Patient in today reporting anxiety mostly related to Covid worsening and "interaction issues" with wife. Work stress not quite a bad right now "but tends to pick up in September". Working from home and wife is there most of the time.  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 Tries to stay calm especially when wife is upset and verbally abusive. Patient reports this is still happening and that he tried to listen to wife but eventually walks away without any protest from wife.  Reviews some of his coping skills in interacting with wife who has bipolar and feels "sometimes I'm doing the right thing in my reactions to wife."  Wife still making accusations that are false about patient and his response is to be more quiet but to assure her of his love and desire to help. Communication skills reviewed again with patient to help him with his wife, and he is feeling more confident with them. Continues to work with his strategy in tx plan of recognizing triggers earlier so as to intercept and change them to be more positive and reality-based. Encouraged to continue good self-care ( emotional and physical) including making self-talk more consistently positive. .  Goal review and progress/challenges noted with patient.  Next appt within 3 weeks.   [name], LCSW

## 2020-03-11 ENCOUNTER — Other Ambulatory Visit: Payer: Self-pay

## 2020-03-11 ENCOUNTER — Encounter (INDEPENDENT_AMBULATORY_CARE_PROVIDER_SITE_OTHER): Payer: BC Managed Care – PPO | Admitting: Ophthalmology

## 2020-03-11 DIAGNOSIS — I10 Essential (primary) hypertension: Secondary | ICD-10-CM

## 2020-03-11 DIAGNOSIS — H348322 Tributary (branch) retinal vein occlusion, left eye, stable: Secondary | ICD-10-CM

## 2020-03-11 DIAGNOSIS — H353221 Exudative age-related macular degeneration, left eye, with active choroidal neovascularization: Secondary | ICD-10-CM | POA: Diagnosis not present

## 2020-03-11 DIAGNOSIS — H353111 Nonexudative age-related macular degeneration, right eye, early dry stage: Secondary | ICD-10-CM

## 2020-03-11 DIAGNOSIS — H35033 Hypertensive retinopathy, bilateral: Secondary | ICD-10-CM

## 2020-03-24 ENCOUNTER — Other Ambulatory Visit: Payer: Self-pay

## 2020-03-24 ENCOUNTER — Ambulatory Visit (INDEPENDENT_AMBULATORY_CARE_PROVIDER_SITE_OTHER): Payer: BC Managed Care – PPO | Admitting: Psychiatry

## 2020-03-24 DIAGNOSIS — F411 Generalized anxiety disorder: Secondary | ICD-10-CM

## 2020-03-24 NOTE — Progress Notes (Signed)
      [facility] Counselor/Therapist Progress Note  Patient ID: Shane [name], MRN: [id number],    Date: 03/24/2020  Time Spent: 60 minutes   8:00am to 9:00am   Treatment Type: Individual Therapy  Reported Symptoms: anxiety, some depression  Mental Status Exam:  Appearance:   Casual     Behavior:  Appropriate  Motor:  Normal  Speech/Language:   Clear and Coherent  Affect:  anxious, some depression/apprehension  Mood:  anxious and depressed  Thought process:  normal  Thought content:    WNL  Sensory/Perceptual disturbances:    WNL  Orientation:  oriented to person, place, time/date, situation, day of week, month of year and year  Attention:  Good  Concentration:  Good  Memory:  WNL  Fund of knowledge:   Good  Insight:    Good  Judgment:   Good  Impulse Control:  Good   Risk Assessment: Danger to Self:  No Self-injurious Behavior: No Danger to Others: No Duty to Warn:no Physical Aggression / Violence:No  Access to Firearms a concern: No  Gang Involvement:No   Subjective: Patient today reports some up and down with emotions re: marital relationship and personal issues.  Interventions: Cognitive Behavioral Therapy and Solution-Oriented/Positive Psychology  Diagnosis:   ICD-10-CM   1. Generalized anxiety disorder  F41.1     Plan: Patient not signing tx updates on computer screen due to COVID   Treatment Goals: Goals remain on tx planwhile patient works on strategies to meet his goals. Progress is documented each session in the "Progress" section on Plan.  Long Term Goal: Enhance the ability to handle effectively his depression and the full variety of life's anxieties.  Short Term Goal: Verbalize an understanding of how thoughts, physical feelings, and behavioral actions contribute to anxiety and anger and its treatment.  Strategy:  Patient will work to recognize and interrupt triggers to hisanxious or depressive thoughtsso as to replace with  positive, more reality-based thoughts, which lead to more positive actions/behaviors.  PROGRESS): Patient in today reporting anxiety and some depression stemming form ongoing issues with wife have been more elevated recently.  Wife is bipolar and that complicates her functioning at times especially in marriage and relationships. Today needed to discuss communication and triggers, which we did especially per his strategy above in treatment goal plan, as he continues to try and recognize/interrupt triggers to his anxious/depressive thoughts and replace them with more positive, reality-based thoughts, which helped him to respond with more positive behaviors in relation to his wife.  Discussed several examples of this in session as he and his wife have had some difficult times more recently but patient reports he is doing better and not always feeling a need to respond quickly without thinking.  Patient's wife is in her own individual therapy and they are to have an initial marital session tomorrow with a different therapist.  Encouraged self-care, emotionally and physically and will pick up on these issues next session.  Goal review and progress/challenges noted with patient.  Next appt within 3 weeks.   [name], LCSW

## 2020-04-14 ENCOUNTER — Ambulatory Visit (INDEPENDENT_AMBULATORY_CARE_PROVIDER_SITE_OTHER): Payer: BC Managed Care – PPO | Admitting: Psychiatry

## 2020-04-14 ENCOUNTER — Other Ambulatory Visit: Payer: Self-pay

## 2020-04-14 DIAGNOSIS — F411 Generalized anxiety disorder: Secondary | ICD-10-CM

## 2020-04-14 NOTE — Progress Notes (Signed)
      [facility] Counselor/Therapist Progress Note  Patient ID: Shane [name], MRN: [id number],    Date: 04/14/2020  Time Spent: 60 minutes 9:00am to 10:00am  Treatment Type: Individual Therapy  Reported Symptoms: anxiety, depression "may be some stronger"  Mental Status Exam:  Appearance:   Well Groomed     Behavior:  Appropriate, Sharing and Motivated  Motor:  Normal  Speech/Language:   Clear and Coherent  Affect:  anxious  Mood:  anxious and depressed  Thought process:  normal  Thought content:    WNL  Sensory/Perceptual disturbances:    WNL  Orientation:  oriented to person, place, time/date, situation, day of week, month of year and year  Attention:  Good  Concentration:  Good  Memory:  WNL  Fund of knowledge:   Good  Insight:    Good  Judgment:   Good  Impulse Control:  Good   Risk Assessment: Danger to Self:  No Self-injurious Behavior: No Danger to Others: No Duty to Warn:no Physical Aggression / Violence:No  Access to Firearms a concern: No  Gang Involvement:No   Subjective:  Patient reports anxiety and depression, with continued issues with wife. Feels he is managing "all these things" some better. Tries to not respond as much when he feels he can't be calmer. No physical manifestation of anger/frustration.  Interventions: Cognitive Behavioral Therapy and Solution-Oriented/Positive Psychology  Diagnosis:   ICD-10-CM   1. Generalized anxiety disorder  F41.1     Plan: Patient not signing tx updates on computer screen due to COVID   Treatment Goals: Goals remain on tx planwhile patient works on strategies to meet his goals. Progress is documented each session in the "Progress" section on Plan.  Long Term Goal: Enhance the ability to handle effectively his depression and the full variety of life's anxieties.  Short Term Goal: Verbalize an understanding of how thoughts, physical feelings, and behavioral actions contribute to anxiety and  anger and its treatment.  Strategy:  Patient will work to recognize and interrupt triggers to hisanxious or depressive thoughtsso as to replace with positive, more reality-based thoughts, which lead to more positive actions/behaviors.  PROGRESS): Patient in today reporting anxiety and depression with depression being some stronger due to some issues recently with wife.  Difficult coping with wife's emotional state and some unpredictability. Recently began marital therapy and seems to feel some hopeful that it will be helpful. Wife continues to "express paranoid thoughts of me doing something to her phone." Worked in session today with patient on his short term goal and strategy from tx plan, to better understand thoughts and behaviors that contribute to anxiety and frustration, while also working to identify/interrupt triggers to his depressive/anxious thoughts and replace with more reality-based thoughts leading to more positive and effective actions/behaviors. Worked specifically also on communication strategies with wife and in setting limits. Encouraged good self-care including better communication skills, some physical movement daily, getting outside daily, staying in the present versus the past or future,  focusing more on what he can control versus not control, and looking for more positives than negatives.   Goal review and progress/challenges noted with patient.  Next appt within 2-3 weeks.   Shane Ace, LCSW

## 2020-04-24 ENCOUNTER — Encounter (INDEPENDENT_AMBULATORY_CARE_PROVIDER_SITE_OTHER): Payer: BC Managed Care – PPO | Admitting: Ophthalmology

## 2020-04-24 ENCOUNTER — Other Ambulatory Visit: Payer: Self-pay

## 2020-04-24 DIAGNOSIS — H348322 Tributary (branch) retinal vein occlusion, left eye, stable: Secondary | ICD-10-CM

## 2020-04-24 DIAGNOSIS — H35033 Hypertensive retinopathy, bilateral: Secondary | ICD-10-CM

## 2020-04-24 DIAGNOSIS — H353111 Nonexudative age-related macular degeneration, right eye, early dry stage: Secondary | ICD-10-CM | POA: Diagnosis not present

## 2020-04-24 DIAGNOSIS — H353221 Exudative age-related macular degeneration, left eye, with active choroidal neovascularization: Secondary | ICD-10-CM

## 2020-04-24 DIAGNOSIS — I10 Essential (primary) hypertension: Secondary | ICD-10-CM

## 2020-04-24 DIAGNOSIS — H43813 Vitreous degeneration, bilateral: Secondary | ICD-10-CM

## 2020-05-05 ENCOUNTER — Ambulatory Visit (INDEPENDENT_AMBULATORY_CARE_PROVIDER_SITE_OTHER): Payer: BC Managed Care – PPO | Admitting: Psychiatry

## 2020-05-05 ENCOUNTER — Other Ambulatory Visit: Payer: Self-pay

## 2020-05-05 DIAGNOSIS — F411 Generalized anxiety disorder: Secondary | ICD-10-CM | POA: Diagnosis not present

## 2020-05-05 NOTE — Progress Notes (Signed)
      [facility] Counselor/Therapist Progress Note  Patient ID: Shane [name], MRN: [id number],    Date: 05/05/2020  Time Spent:  60 minutes  9:00am to 10:00am  Treatment Type: Individual Therapy  Reported Symptoms: anxiety, mild depression, some communication issues with wife but getting better   Mental Status Exam:  Appearance:   Casual     Behavior:  Appropriate, Sharing and Motivated  Motor:  Normal  Speech/Language:   Clear and Coherent  Affect:  anxious, some depression  Mood:  anxious and depressed  Thought process:  normal  Thought content:    WNL  Sensory/Perceptual disturbances:    WNL  Orientation:  oriented to person, place, time/date, situation, day of week, month of year and year  Attention:  Good  Concentration:  Good  Memory:  WNL  Fund of knowledge:   Good  Insight:    Good  Judgment:   Good  Impulse Control:  Good   Risk Assessment: Danger to Self:  No Self-injurious Behavior: No Danger to Others: No Duty to Warn:no Physical Aggression / Violence:No  Access to Firearms a concern: No  Gang Involvement:No   Subjective: Patient today reports making some improvements in marital relationship as they work with a Chiropodist.  States he's still having anxiety and some mild depression regarding his own personal issues.  Interventions: Cognitive Behavioral Therapy and Solution-Oriented/Positive Psychology  Diagnosis:   ICD-10-CM   1. Generalized anxiety disorder  F41.1     Plan: Patient not signing tx updates on computer screen due to COVID   Treatment Goals: Goals remain on tx planwhile patient works on strategies to meet his goals. Progress is documented each session in the "Progress" section on Plan.  Long Term Goal: Enhance the ability to handle effectively his depression and the full variety of life's anxieties.  Short Term Goal: Verbalize an understanding of how thoughts, physical feelings, and behavioral actions  contribute to anxiety and anger and its treatment.  Strategy:  Patient will work to recognize and interrupt triggers to hisanxious or depressive thoughtsso as to replace with positive, more reality-based thoughts, which lead to more positive actions/behaviors.  PROGRESS): Patient in today reporting anxiety and depression, but "also some improvements especially marital."  Able to focus better on some projects and training on his job. More motivated. "Wife still having and voicing some paranoid thoughts" to husband" but not any worse." Patient reports this is some better, but is still happening. Worked more specifically on his anxiety today, often in reaction to incidents in relationship, especially using her short-term goal and strategy in Tx plan above. Identifies types of situations where the anxiety tends to flare up especially with wife and works also on how he can be more proactive rather than reactive. 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   Goal review and progress/challenges noted with patient.  Next appt within 2-3 weeks.   [name], LCSW

## 2020-05-26 ENCOUNTER — Ambulatory Visit (INDEPENDENT_AMBULATORY_CARE_PROVIDER_SITE_OTHER): Payer: BC Managed Care – PPO | Admitting: Psychiatry

## 2020-05-26 ENCOUNTER — Other Ambulatory Visit: Payer: Self-pay

## 2020-05-26 DIAGNOSIS — F411 Generalized anxiety disorder: Secondary | ICD-10-CM | POA: Diagnosis not present

## 2020-05-26 NOTE — Progress Notes (Signed)
      [facility] Counselor/Therapist Progress Note  Patient ID: SEM MCCAUGHEY, MRN: [id number],    Date: 05/26/2020  Time Spent:  60 minutes   9:00am to 10:00am  Treatment Type: Individual Therapy  Reported Symptoms: anxiety  Mental Status Exam:  Appearance:   Casual     Behavior:  Appropriate, Sharing and Motivated  Motor:  Normal  Speech/Language:   Clear and Coherent  Affect:  anxious, mild depression  Mood:  anxious and mild depression  Thought process:  normal  Thought content:    WNL  Sensory/Perceptual disturbances:    WNL  Orientation:  oriented to person, place, time/date, situation, day of week, month of year and year  Attention:  Good  Concentration:  Good  Memory:  WNL  Fund of knowledge:   Good  Insight:    Good  Judgment:   Good  Impulse Control:  Good   Risk Assessment: Danger to Self:  No Self-injurious Behavior: No Danger to Others: No Duty to Warn:no Physical Aggression / Violence:No  Access to Firearms a concern: No  Gang Involvement:No   Subjective: Patient today reports anxiety as main symptom, and mild depression.  Continues to strictly limit alcohol and feeling good about that.  Interventions: Cognitive Behavioral Therapy and Solution-Oriented/Positive Psychology  Diagnosis:   ICD-10-CM   1. Generalized anxiety disorder  F41.1     Plan: Patient not signing tx updates on computer screen due to COVID   Treatment Goals: Goals remain on tx planwhile patient works on strategies to meet his goals. Progress is documented each session in the "Progress" section on Plan.  Long Term Goal: Enhance the ability to handle effectively his depression and the full variety of life's anxieties.  Short Term Goal: Verbalize an understanding of how thoughts, physical feelings, and behavioral actions contribute to anxiety and anger and its treatment.  Strategy:  Patient will work to recognize and interrupt triggers to hisanxious or  depressive thoughtsso as to replace with positive, more reality-based thoughts, which lead to more positive actions/behaviors.  PROGRESS): Patient reports anxiety and mild depression, and continues to strictly limit alcohol use, "for example 2-3 drinks watching a football game weekly. Still working in marital therapy on joint issues and trying to communicate better, figuring out how to diffuse the situation. Patient wanting to strengthen his  "toolkit" in dealing with communication and conflict at work and at home. Using his short term goal and strategy in treatment goal plan above, we worked today on strategies for him  to better manage conflict, anxious/negative thoughts, and communication issues. Patient made written list for him to reflect on more outside of session.  Discussed"active listening" techniques which support all of his strategies in better managing conflict and anxious/negative thought patterns. Showing good progress.  Encouraged patient in his progress as he focuses more on what he can control versus cannot control, practice more "active listening", try staying in the present more and not jumping into the future, looking for more positives than negatives each day, follow through on his written notes in today's session in reference to communication at home and work, and making hisself talk positive and affirming.  Goal review and progress/challenges noted with patient.  Next appt within 3-4  Weeks.   [name], LCSW

## 2020-06-03 ENCOUNTER — Encounter (INDEPENDENT_AMBULATORY_CARE_PROVIDER_SITE_OTHER): Payer: BC Managed Care – PPO | Admitting: Ophthalmology

## 2020-06-04 ENCOUNTER — Encounter: Payer: BC Managed Care – PPO | Admitting: Family Medicine

## 2020-06-09 DIAGNOSIS — F3110 Bipolar disorder, current episode manic without psychotic features, unspecified: Secondary | ICD-10-CM | POA: Diagnosis not present

## 2020-06-09 DIAGNOSIS — F4010 Social phobia, unspecified: Secondary | ICD-10-CM | POA: Diagnosis not present

## 2020-06-09 DIAGNOSIS — F411 Generalized anxiety disorder: Secondary | ICD-10-CM | POA: Diagnosis not present

## 2020-06-09 DIAGNOSIS — F4310 Post-traumatic stress disorder, unspecified: Secondary | ICD-10-CM | POA: Diagnosis not present

## 2020-06-10 ENCOUNTER — Other Ambulatory Visit: Payer: Self-pay

## 2020-06-10 ENCOUNTER — Encounter (INDEPENDENT_AMBULATORY_CARE_PROVIDER_SITE_OTHER): Payer: BC Managed Care – PPO | Admitting: Ophthalmology

## 2020-06-10 DIAGNOSIS — H353221 Exudative age-related macular degeneration, left eye, with active choroidal neovascularization: Secondary | ICD-10-CM

## 2020-06-10 DIAGNOSIS — H353111 Nonexudative age-related macular degeneration, right eye, early dry stage: Secondary | ICD-10-CM | POA: Diagnosis not present

## 2020-06-10 DIAGNOSIS — H35033 Hypertensive retinopathy, bilateral: Secondary | ICD-10-CM | POA: Diagnosis not present

## 2020-06-10 DIAGNOSIS — I10 Essential (primary) hypertension: Secondary | ICD-10-CM | POA: Diagnosis not present

## 2020-06-10 DIAGNOSIS — H348322 Tributary (branch) retinal vein occlusion, left eye, stable: Secondary | ICD-10-CM

## 2020-06-10 DIAGNOSIS — H43813 Vitreous degeneration, bilateral: Secondary | ICD-10-CM

## 2020-06-16 ENCOUNTER — Ambulatory Visit: Payer: BC Managed Care – PPO | Admitting: Psychiatry

## 2020-06-23 ENCOUNTER — Other Ambulatory Visit: Payer: Self-pay

## 2020-06-23 ENCOUNTER — Ambulatory Visit (INDEPENDENT_AMBULATORY_CARE_PROVIDER_SITE_OTHER): Payer: BC Managed Care – PPO | Admitting: Psychiatry

## 2020-06-23 DIAGNOSIS — F411 Generalized anxiety disorder: Secondary | ICD-10-CM

## 2020-06-23 NOTE — Progress Notes (Signed)
[facility] Counselor/Therapist Progress Note  Patient ID: Shane [name], MRN: [id number],    Date: 06/23/2020  Time Spent:  12 mintues   11:00am to 12:00noon  Treatment Type: Individual Therapy  Reported Symptoms: anxiety, depression, sad over situation with wife  Mental Status Exam:  Appearance:   Casual     Behavior:  Appropriate, Sharing and Motivated  Motor:  Normal  Speech/Language:   Clear and Coherent  Affect:  anxious, depressed,some sadness re: marriage  Mood:  anxious, irritable and sad  Thought process:  goal directed  Thought content:    WNL  Sensory/Perceptual disturbances:    WNL  Orientation:  oriented to person, place, time/date, situation, day of week, month of year and year  Attention:  Good  Concentration:  Good and Fair  Memory:  WNL  Fund of knowledge:   Good  Insight:    Good and Fair  Judgment:   Good  Impulse Control:  Good and Fair   Risk Assessment: Danger to Self:  No Self-injurious Behavior: No Danger to Others: No Duty to Warn:no Physical Aggression / Violence:No  Access to Firearms a concern: No  Gang Involvement:No   Subjective: Patient today reports anxiety, frustration, and sadness," mostly around marital issues that affect personal issues."    Interventions: Cognitive Behavioral Therapy and Solution-Oriented/Positive Psychology  Diagnosis:   ICD-10-CM   1. Generalized anxiety disorder  F41.1      Plan: Patient not signing tx updates on computer screen due to COVID   Treatment Goals: Goals remain on tx planwhile patient works on strategies to meet his goals. Progress is documented each session in the "Progress" section on Plan.  Long Term Goal: Enhance the ability to handle effectively his depression and the full variety of life's anxieties.  Short Term Goal: Verbalize an understanding of how thoughts, physical feelings, and behavioral actions contribute to anxiety and anger and its  treatment.  Strategy:  Patient will work to recognize and interrupt triggers to hisanxious or depressive thoughtsso as to replace with positive, more reality-based thoughts, which lead to more positive actions/behaviors.  PROGRESS: Patient in today reporting anxiety, some depression, and marital issues. Processes some very recent interactions/conversations with his wife, which has led him to more seriously question his marriage.  Problems increased most recently with wife's being paranoid and patient is finding it difficult "to know what to do next." Is feeling not very hopeful about his marriage as he is" very tired of hoping for the best and wife seeming to sabotage things and make accusations that are just not true."  Needed session today to discuss what all this means to him and he is not wanting to make a quick decision but is getting increasingly exhausted in "trying to help make things work out better for their marriage".  Stayed in a hotel recently when reportedly wife was being very difficult and making paranoid accusations of him again.  Patient processed a lot of his feelings and frustrations today and is choosing not to make any quick decisions until he has had more time to think about it and try to discuss some his most current marital concern in his marital therapy session coming up.  Reviewed some of our prior work on managing his anxious/negative thoughts, conflict, and best communication strategies which can help him at home and at work.  Also reviewed active listening skills that can very much be a part of his better managing conflict.  Encouraged patient to try  to stay in the present, focus on what he can control versus cannot control, continue to use written notes from our sessions in reference to communication at home and work, consistently practice active listening skills, looking intentionally for positives each day, and aching self talk more encouraging and positive.  Goal  review and progress/challenges noted with patient.  Next appt within 3 weeks.   Shane Ace, LCSW

## 2020-07-07 ENCOUNTER — Other Ambulatory Visit: Payer: Self-pay | Admitting: Adult Health

## 2020-07-07 ENCOUNTER — Other Ambulatory Visit: Payer: Self-pay | Admitting: Cardiovascular Disease

## 2020-07-07 ENCOUNTER — Other Ambulatory Visit: Payer: Self-pay | Admitting: Family Medicine

## 2020-07-07 DIAGNOSIS — E8881 Metabolic syndrome: Secondary | ICD-10-CM

## 2020-07-07 DIAGNOSIS — F321 Major depressive disorder, single episode, moderate: Secondary | ICD-10-CM

## 2020-07-07 DIAGNOSIS — F331 Major depressive disorder, recurrent, moderate: Secondary | ICD-10-CM

## 2020-07-07 DIAGNOSIS — G4730 Sleep apnea, unspecified: Secondary | ICD-10-CM

## 2020-07-07 DIAGNOSIS — F39 Unspecified mood [affective] disorder: Secondary | ICD-10-CM

## 2020-07-07 DIAGNOSIS — F411 Generalized anxiety disorder: Secondary | ICD-10-CM

## 2020-07-07 DIAGNOSIS — E785 Hyperlipidemia, unspecified: Secondary | ICD-10-CM

## 2020-07-07 DIAGNOSIS — Z951 Presence of aortocoronary bypass graft: Secondary | ICD-10-CM

## 2020-07-07 DIAGNOSIS — E038 Other specified hypothyroidism: Secondary | ICD-10-CM

## 2020-07-07 DIAGNOSIS — I25700 Atherosclerosis of coronary artery bypass graft(s), unspecified, with unstable angina pectoris: Secondary | ICD-10-CM

## 2020-07-07 DIAGNOSIS — R0683 Snoring: Secondary | ICD-10-CM

## 2020-07-07 DIAGNOSIS — F324 Major depressive disorder, single episode, in partial remission: Secondary | ICD-10-CM

## 2020-07-21 ENCOUNTER — Ambulatory Visit: Payer: BC Managed Care – PPO | Admitting: Psychiatry

## 2020-07-22 ENCOUNTER — Other Ambulatory Visit: Payer: Self-pay

## 2020-07-22 ENCOUNTER — Encounter (INDEPENDENT_AMBULATORY_CARE_PROVIDER_SITE_OTHER): Payer: BC Managed Care – PPO | Admitting: Ophthalmology

## 2020-07-22 DIAGNOSIS — H353111 Nonexudative age-related macular degeneration, right eye, early dry stage: Secondary | ICD-10-CM

## 2020-07-22 DIAGNOSIS — H348322 Tributary (branch) retinal vein occlusion, left eye, stable: Secondary | ICD-10-CM

## 2020-07-22 DIAGNOSIS — H35033 Hypertensive retinopathy, bilateral: Secondary | ICD-10-CM

## 2020-07-22 DIAGNOSIS — I10 Essential (primary) hypertension: Secondary | ICD-10-CM

## 2020-07-22 DIAGNOSIS — H353221 Exudative age-related macular degeneration, left eye, with active choroidal neovascularization: Secondary | ICD-10-CM

## 2020-07-22 DIAGNOSIS — H43813 Vitreous degeneration, bilateral: Secondary | ICD-10-CM

## 2020-07-30 ENCOUNTER — Ambulatory Visit (INDEPENDENT_AMBULATORY_CARE_PROVIDER_SITE_OTHER): Payer: BC Managed Care – PPO | Admitting: Adult Health

## 2020-07-30 ENCOUNTER — Encounter: Payer: Self-pay | Admitting: Adult Health

## 2020-07-30 ENCOUNTER — Other Ambulatory Visit: Payer: Self-pay

## 2020-07-30 DIAGNOSIS — F410 Panic disorder [episodic paroxysmal anxiety] without agoraphobia: Secondary | ICD-10-CM | POA: Diagnosis not present

## 2020-07-30 DIAGNOSIS — F321 Major depressive disorder, single episode, moderate: Secondary | ICD-10-CM | POA: Diagnosis not present

## 2020-07-30 DIAGNOSIS — F411 Generalized anxiety disorder: Secondary | ICD-10-CM | POA: Diagnosis not present

## 2020-07-30 DIAGNOSIS — F39 Unspecified mood [affective] disorder: Secondary | ICD-10-CM

## 2020-07-30 MED ORDER — LORAZEPAM 0.5 MG PO TABS: 0.5000 mg | ORAL_TABLET | Freq: Two times a day (BID) | ORAL | 2 refills | Status: DC

## 2020-07-30 MED ORDER — ARIPIPRAZOLE 5 MG PO TABS: ORAL_TABLET | ORAL | 1 refills | Status: DC

## 2020-07-30 MED ORDER — ESCITALOPRAM OXALATE 20 MG PO TABS: 20.0000 mg | ORAL_TABLET | Freq: Every day | ORAL | 1 refills | Status: DC

## 2020-07-30 NOTE — Progress Notes (Signed)
Shane [name] [id number] [birth year] 55 y.o.  Subjective:   Patient ID:  Shane [name] is a 55 y.o. (DOB July 24, 1965) male.  Chief Complaint: No chief complaint on file.   HPI Shane [name] presents to the office today for follow-up of GAD, MDD, insomnia, mood disorder, panic attacks.   Describes mood today as "ok". Pleasant. Mood symptoms - denies depression, anxiety, and irritability. Mood "level". Stating "I feel good". He and wife doing well. Stable interest and motivation. Taking medications as prescribed.  Energy levels stable. Active, does not have a regular exercise routine.  Enjoys some usual interests and activities. Married. Lives with wife. Has 2 grown sons. Family in [place].  Appetite adequate. Weight gain 9 pounds  - 170 pounds.  Sleeps well most nights. Averages 8 hours. Has not been using CPAP machine. Focus and concentration "good". Completing tasks. Managing aspects of household. Work going well - modified schedule. Denies SI or HI.  Denies AH or VH.    GAD-7   Flowsheet Row Office Visit from 05/24/2018 in [place] Visit from 04/03/2018 in [place]  Total GAD-7 Score 2 9    PHQ2-9   [place] Office Visit from 12/03/2019 in [place] Visit from 04/03/2019 in [place] Visit from 02/27/2019 in [place] Visit from 11/29/2018 in [place] Visit from 09/05/2018 in [place]  PHQ-2 Total Score 0 1 2 0 0  PHQ-9 Total Score 1 4 4  0 2       Review of Systems:  Review of Systems  Musculoskeletal: Negative for gait problem.  Neurological: Negative for tremors.  Psychiatric/Behavioral:       Please refer to HPI    Medications: I have reviewed the patient's current medications.  Current Outpatient Medications  Medication Sig Dispense Refill   . ARIPiprazole (ABILIFY) 5 MG tablet Take 1 tablet by mouth daily. 90 tablet 1  . aspirin 81 MG tablet Take 81 mg by mouth daily.    Marland Kitchen BESIVANCE 0.6 % SUSP as needed.   1  . carvedilol (COREG) 6.25 MG tablet Take 1 tablet by mouth twice daily. **Patient needs office visit for future refills.** 180 tablet 3  . escitalopram (LEXAPRO) 20 MG tablet Take 1 tablet (20 mg total) by mouth daily. 90 tablet 1  . ibuprofen (ADVIL,MOTRIN) 200 MG tablet Take 200 mg by mouth every 6 (six) hours as needed.    Marland Kitchen LORazepam (ATIVAN) 0.5 MG tablet Take 1 tablet (0.5 mg total) by mouth 2 (two) times daily. 60 tablet 2  . metFORMIN (GLUCOPHAGE-XR) 750 MG 24 hr tablet Take 1 tablet by mouth daily with breakfast. 90 tablet 0  . nitroGLYCERIN (NITROSTAT) 0.4 MG SL tablet Place 1 tablet (0.4 mg total) under the tongue every 5 (five) minutes as needed for chest pain. 25 tablet 6  . NONFORMULARY OR COMPOUNDED ITEM Shots for macular degeneration left eye    . rosuvastatin (CRESTOR) 20 MG tablet Take 1 tablet by mouth daily. Please call to schedule office visit prior to further refills 90 tablet 0   No current facility-administered medications for this visit.    Medication Side Effects: None  Allergies: No Known Allergies  Past Medical History:  Diagnosis Date  . CAD (coronary artery disease), s/p stent 2011 and CABG 2015 [facility])   . Hodgkin disease ([place]) 1997  . Hyperlipidemia   . Macular  degeneration   . PVC's (premature ventricular contractions)     Family History  Problem Relation Age of Onset  . Stroke Mother   . Cancer Mother   . COPD Mother   . Heart disease Father   . COPD Father   . Heart attack Father   . Heart Problems Brother        heart valve replaced at 5 years old  . Heart Problems Brother        MI at age 7; CABG at age 56  . Cancer Maternal Grandmother   . Colon cancer Neg Hx     Social History   Socioeconomic History  . Marital status: Married    Spouse name:  Not on file  . Number of children: Not on file  . Years of education: Not on file  . Highest education level: Not on file  Occupational History  . Not on file  Tobacco Use  . Smoking status: Never Smoker  . Smokeless tobacco: Former Systems developer  . Tobacco comment: a long time ago  Vaping Use  . Vaping Use: Never used  Substance and Sexual Activity  . Alcohol use: Yes    Alcohol/week: 3.0 standard drinks    Types: 3 Cans of beer per week    Comment: sometimes 3 beers daily  . Drug use: No  . Sexual activity: Not on file    Comment: Not reported  Other Topics Concern  . Not on file  Social History Narrative  . Not on file   Social Determinants of Health   Financial Resource Strain: Not on file  Food Insecurity: Not on file  Transportation Needs: Not on file  Physical Activity: Not on file  Stress: Not on file  Social Connections: Not on file  Intimate Partner Violence: Not on file    Past Medical History, Surgical history, Social history, and Family history were reviewed and updated as appropriate.   Please see review of systems for further details on the patient's review from today.   Objective:   Physical Exam:  There were no vitals taken for this visit.  Physical Exam Constitutional:      General: He is not in acute distress. Musculoskeletal:        General: No deformity.  Neurological:     Mental Status: He is alert and oriented to person, place, and time.     Coordination: Coordination normal.  Psychiatric:        Attention and Perception: Attention and perception normal. He does not perceive auditory or visual hallucinations.        Mood and Affect: Mood normal. Mood is not anxious or depressed. Affect is not labile, blunt, angry or inappropriate.        Speech: Speech normal.        Behavior: Behavior normal.        Thought Content: Thought content normal. Thought content is not paranoid or delusional. Thought content does not include homicidal or suicidal  ideation. Thought content does not include homicidal or suicidal plan.        Cognition and Memory: Cognition and memory normal.        Judgment: Judgment normal.     Comments: Insight intact     Lab Review:     Component Value Date/Time   NA 140 12/03/2019 1523   NA 141 12/24/2015 1051   K 4.7 12/03/2019 1523   K 4.6 12/24/2015 1051   CL 101 12/03/2019 1523   CO2  31 12/03/2019 1523   CO2 28 12/24/2015 1051   GLUCOSE 97 12/03/2019 1523   GLUCOSE 95 12/24/2015 1051   BUN 16 12/03/2019 1523   BUN 14.6 12/24/2015 1051   CREATININE 0.90 12/03/2019 1523   CREATININE 1.1 12/24/2015 1051   CALCIUM 10.0 12/03/2019 1523   CALCIUM 9.8 12/24/2015 1051   PROT 6.6 12/03/2019 1523   PROT 7.1 10/19/2019 1146   PROT 7.6 12/24/2015 1051   ALBUMIN 4.7 12/03/2019 1523   ALBUMIN 4.8 10/19/2019 1146   ALBUMIN 4.5 12/24/2015 1051   AST 28 12/03/2019 1523   AST 25 12/24/2015 1051   ALT 69 (H) 12/03/2019 1523   ALT 34 12/24/2015 1051   ALKPHOS 120 (H) 12/03/2019 1523   ALKPHOS 45 12/24/2015 1051   BILITOT 1.2 12/03/2019 1523   BILITOT 1.3 (H) 10/19/2019 1146   BILITOT 0.99 12/24/2015 1051       Component Value Date/Time   WBC 7.1 12/03/2019 1523   RBC 4.24 12/03/2019 1523   HGB 13.7 12/03/2019 1523   HGB 15.7 12/24/2015 1050   HCT 40.3 12/03/2019 1523   HCT 44.7 12/24/2015 1050   PLT 224.0 12/03/2019 1523   PLT 263 12/24/2015 1050   MCV 94.9 12/03/2019 1523   MCV 93 12/24/2015 1050   MCH 32.6 12/24/2015 1050   MCHC 33.9 12/03/2019 1523   RDW 13.6 12/03/2019 1523   RDW 12.9 12/24/2015 1050   LYMPHSABS 1.3 12/03/2019 1523   LYMPHSABS 1.3 12/24/2015 1050   MONOABS 0.5 12/03/2019 1523   EOSABS 0.1 12/03/2019 1523   EOSABS 0.1 12/24/2015 1050   BASOSABS 0.1 12/03/2019 1523   BASOSABS 0.0 12/24/2015 1050    No results found for: POCLITH, LITHIUM   No results found for: PHENYTOIN, PHENOBARB, VALPROATE, CBMZ   .res Assessment: Plan:     Plan  1. Continue Lexapro 20mg   daily 2. Continue Ativan 0.5mg  BID - takes occasionally. 3. Continue Abilify 5mg  daily.  Continue therapy with Rinaldo Cloud.   RTC 6 months  Patient advised to contact office with any questions, adverse effects, or acute worsening in signs and symptoms.  Discussed potential benefits, risk, and side effects of benzodiazepines to include potential risk of tolerance and dependence, as well as possible drowsiness.  Advised patient not to drive if experiencing drowsiness and to take lowest possible effective dose to minimize risk of dependence and tolerance.   Discussed potential metabolic side effects associated with atypical antipsychotics, as well as potential risk for movement side effects. Advised pt to contact office if movement side effects occur.  Diagnoses and all orders for this visit:   Diagnoses and all orders for this visit:  Depression, major, single episode, moderate ([place]) Comments: Marriage issues. Wife with mood disorder. Orders: -     escitalopram (LEXAPRO) 20 MG tablet; Take 1 tablet (20 mg total) by mouth daily.  Generalized anxiety disorder -     ARIPiprazole (ABILIFY) 5 MG tablet; Take 1 tablet by mouth daily.  Episodic mood disorder (HCC) -     ARIPiprazole (ABILIFY) 5 MG tablet; Take 1 tablet by mouth daily.  Anxiety attack Comments: Ativan low dose, sparingly.  Orders: -     LORazepam (ATIVAN) 0.5 MG tablet; Take 1 tablet (0.5 mg total) by mouth 2 (two) times daily.     Please see After Visit Summary for patient specific instructions.  Future Appointments  Date Time Provider [place]  09/02/2020  9:00 AM Ward Givens, NP [facility] None  09/03/2020  8:45 AM  Hayden Pedro, MD [facility] None  10/09/2020  8:30 AM Leamon Arnt, MD [facility]  01/27/2021  8:20 AM Mardelle Pandolfi, Berdie Ogren, NP [facility] None    No orders of the defined types were placed in this encounter.   -------------------------------

## 2020-09-01 ENCOUNTER — Telehealth: Payer: Self-pay

## 2020-09-01 NOTE — Telephone Encounter (Signed)
Pt not using cpap, will call back to r/s appt with NP

## 2020-09-02 ENCOUNTER — Ambulatory Visit: Payer: BC Managed Care – PPO | Admitting: Adult Health

## 2020-09-03 ENCOUNTER — Other Ambulatory Visit: Payer: Self-pay

## 2020-09-03 ENCOUNTER — Encounter (INDEPENDENT_AMBULATORY_CARE_PROVIDER_SITE_OTHER): Payer: BC Managed Care – PPO | Admitting: Ophthalmology

## 2020-09-03 DIAGNOSIS — I10 Essential (primary) hypertension: Secondary | ICD-10-CM | POA: Diagnosis not present

## 2020-09-03 DIAGNOSIS — H353111 Nonexudative age-related macular degeneration, right eye, early dry stage: Secondary | ICD-10-CM

## 2020-09-03 DIAGNOSIS — H43813 Vitreous degeneration, bilateral: Secondary | ICD-10-CM

## 2020-09-03 DIAGNOSIS — H353221 Exudative age-related macular degeneration, left eye, with active choroidal neovascularization: Secondary | ICD-10-CM | POA: Diagnosis not present

## 2020-09-03 DIAGNOSIS — H35033 Hypertensive retinopathy, bilateral: Secondary | ICD-10-CM | POA: Diagnosis not present

## 2020-09-03 DIAGNOSIS — H348322 Tributary (branch) retinal vein occlusion, left eye, stable: Secondary | ICD-10-CM

## 2020-09-11 DIAGNOSIS — F4010 Social phobia, unspecified: Secondary | ICD-10-CM | POA: Diagnosis not present

## 2020-09-11 DIAGNOSIS — F411 Generalized anxiety disorder: Secondary | ICD-10-CM | POA: Diagnosis not present

## 2020-09-11 DIAGNOSIS — F902 Attention-deficit hyperactivity disorder, combined type: Secondary | ICD-10-CM | POA: Diagnosis not present

## 2020-09-11 DIAGNOSIS — F3110 Bipolar disorder, current episode manic without psychotic features, unspecified: Secondary | ICD-10-CM | POA: Diagnosis not present

## 2020-10-05 ENCOUNTER — Other Ambulatory Visit: Payer: Self-pay | Admitting: Cardiovascular Disease

## 2020-10-05 ENCOUNTER — Other Ambulatory Visit: Payer: Self-pay | Admitting: Family Medicine

## 2020-10-05 DIAGNOSIS — E785 Hyperlipidemia, unspecified: Secondary | ICD-10-CM

## 2020-10-05 DIAGNOSIS — F324 Major depressive disorder, single episode, in partial remission: Secondary | ICD-10-CM

## 2020-10-05 DIAGNOSIS — G4730 Sleep apnea, unspecified: Secondary | ICD-10-CM

## 2020-10-05 DIAGNOSIS — E038 Other specified hypothyroidism: Secondary | ICD-10-CM

## 2020-10-05 DIAGNOSIS — E8881 Metabolic syndrome: Secondary | ICD-10-CM

## 2020-10-05 DIAGNOSIS — Z951 Presence of aortocoronary bypass graft: Secondary | ICD-10-CM

## 2020-10-05 DIAGNOSIS — R0683 Snoring: Secondary | ICD-10-CM

## 2020-10-05 DIAGNOSIS — I25700 Atherosclerosis of coronary artery bypass graft(s), unspecified, with unstable angina pectoris: Secondary | ICD-10-CM

## 2020-10-09 ENCOUNTER — Encounter: Payer: Self-pay | Admitting: Family Medicine

## 2020-10-09 ENCOUNTER — Ambulatory Visit (INDEPENDENT_AMBULATORY_CARE_PROVIDER_SITE_OTHER): Payer: BC Managed Care – PPO | Admitting: Family Medicine

## 2020-10-09 ENCOUNTER — Other Ambulatory Visit: Payer: Self-pay

## 2020-10-09 VITALS — BP 118/82 | HR 68 | Temp 96.9°F | Ht 68.0 in | Wt 170.8 lb

## 2020-10-09 DIAGNOSIS — F339 Major depressive disorder, recurrent, unspecified: Secondary | ICD-10-CM

## 2020-10-09 DIAGNOSIS — I10 Essential (primary) hypertension: Secondary | ICD-10-CM | POA: Diagnosis not present

## 2020-10-09 DIAGNOSIS — Z23 Encounter for immunization: Secondary | ICD-10-CM

## 2020-10-09 DIAGNOSIS — Z0000 Encounter for general adult medical examination without abnormal findings: Secondary | ICD-10-CM | POA: Diagnosis not present

## 2020-10-09 DIAGNOSIS — E8881 Metabolic syndrome: Secondary | ICD-10-CM

## 2020-10-09 DIAGNOSIS — F411 Generalized anxiety disorder: Secondary | ICD-10-CM

## 2020-10-09 DIAGNOSIS — E038 Other specified hypothyroidism: Secondary | ICD-10-CM | POA: Diagnosis not present

## 2020-10-09 DIAGNOSIS — E782 Mixed hyperlipidemia: Secondary | ICD-10-CM

## 2020-10-09 DIAGNOSIS — I2510 Atherosclerotic heart disease of native coronary artery without angina pectoris: Secondary | ICD-10-CM | POA: Diagnosis not present

## 2020-10-09 DIAGNOSIS — Z8571 Personal history of Hodgkin lymphoma: Secondary | ICD-10-CM

## 2020-10-09 DIAGNOSIS — E88819 Insulin resistance, unspecified: Secondary | ICD-10-CM

## 2020-10-09 LAB — LIPID PANEL
Cholesterol: 103 mg/dL (ref 0–200)
HDL: 44.5 mg/dL (ref >39.00)
LDL Cholesterol: 48 mg/dL (ref 0–99)
NonHDL: 58.97
Total CHOL/HDL Ratio: 2
Triglycerides: 56 mg/dL (ref 0.0–149.0)
VLDL: 11.2 mg/dL (ref 0.0–40.0)

## 2020-10-09 LAB — CBC WITH DIFFERENTIAL/PLATELET
Basophils Absolute: 0.1 10*3/uL (ref 0.0–0.1)
Basophils Relative: 1 % (ref 0.0–3.0)
Eosinophils Absolute: 0.1 10*3/uL (ref 0.0–0.7)
Eosinophils Relative: 1.7 % (ref 0.0–5.0)
HCT: 40.9 % (ref 39.0–52.0)
Hemoglobin: 14 g/dL (ref 13.0–17.0)
Lymphocytes Relative: 22.7 % (ref 12.0–46.0)
Lymphs Abs: 1.2 10*3/uL (ref 0.7–4.0)
MCHC: 34.1 g/dL (ref 30.0–36.0)
MCV: 93.9 fl (ref 78.0–100.0)
Monocytes Absolute: 0.5 10*3/uL (ref 0.1–1.0)
Monocytes Relative: 8.8 % (ref 3.0–12.0)
Neutro Abs: 3.4 10*3/uL (ref 1.4–7.7)
Neutrophils Relative %: 65.8 % (ref 43.0–77.0)
Platelets: 225 10*3/uL (ref 150.0–400.0)
RBC: 4.36 Mil/uL (ref 4.22–5.81)
RDW: 13.6 % (ref 11.5–15.5)
WBC: 5.2 10*3/uL (ref 4.0–10.5)

## 2020-10-09 LAB — COMPREHENSIVE METABOLIC PANEL
ALT: 79 U/L — ABNORMAL HIGH (ref 0–53)
AST: 68 U/L — ABNORMAL HIGH (ref 0–37)
Albumin: 4.3 g/dL (ref 3.5–5.2)
Alkaline Phosphatase: 73 U/L (ref 39–117)
BUN: 20 mg/dL (ref 6–23)
CO2: 27 mEq/L (ref 19–32)
Calcium: 9.2 mg/dL (ref 8.4–10.5)
Chloride: 106 mEq/L (ref 96–112)
Creatinine, Ser: 1.12 mg/dL (ref 0.40–1.50)
GFR: 73.77 mL/min (ref >60.00)
Glucose, Bld: 126 mg/dL — ABNORMAL HIGH (ref 70–99)
Potassium: 4.7 mEq/L (ref 3.5–5.1)
Sodium: 140 mEq/L (ref 135–145)
Total Bilirubin: 1 mg/dL (ref 0.2–1.2)
Total Protein: 6.3 g/dL (ref 6.0–8.3)

## 2020-10-09 LAB — T4, FREE: Free T4: 0.71 ng/dL (ref 0.60–1.60)

## 2020-10-09 LAB — HEMOGLOBIN A1C: Hgb A1c MFr Bld: 5.8 % (ref 4.6–6.5)

## 2020-10-09 LAB — TSH: TSH: 6.46 u[IU]/mL — ABNORMAL HIGH (ref 0.35–4.50)

## 2020-10-09 NOTE — Progress Notes (Signed)
Subjective  Chief Complaint  Patient presents with  . Annual Exam    Fasting.    HPI: Shane [name] is a 55 y.o. male who presents to [place] at [place] today for a Male Wellness Visit. He also has the concerns and/or needs as listed above in the chief complaint. These will be addressed in addition to the Health Maintenance Visit.   Wellness Visit: annual visit with health maintenance review and exam    Health maintenance: Colorectal cancer screening up-to-date.  Feels well.  No new concerns.  Little exercise but hoping to change back.  Weight is up a few pounds.  Eligible for Shingrix vaccinations.  Other immunizations are up-to-date.  Sees eye doctor every 6 weeks due to macular degeneration treatments.  Mood followed by psychiatry fairly well controlled.  Body mass index is 25.97 kg/m. Wt Readings from Last 3 Encounters:  10/09/20 170 lb 12.8 oz (77.5 kg)  12/03/19 164 lb 9.6 oz (74.7 kg)  10/19/19 163 lb 9.6 oz (74.2 kg)   Lab Results  Component Value Date   HGBA1C 5.8 12/03/2019   HGBA1C 5.7 09/05/2018   HGBA1C 5.7 11/16/2017      Chronic disease management visit and/or acute problem visit:  Coronary disease with hypertension and hyperlipidemia: Reviewed medications.  Continues on beta-blocker, statin and baby aspirin.  Denies chest pain.  No shortness of breath or lower extremity edema.  No myalgias.  Compliant with medications.  Fasting for recheck today.  Sleep apnea: Has not been very compliant with CPAP machine.  Starting to use again.  Will follow up with pulmonology.  Denies daytime somnolence.  Subclinical hypothyroidism by labs.  Normal energy levels.  No tremor or symptoms of low thyroid.  No history of hypothyroidism.  History of insulin resistance placed on Metformin by prior PCP.  Continues to take 750 mg daily.  No symptoms of hyperglycemia.  Normal A1c is as noted above over the last several years.   Patient Active Problem List    Diagnosis Date Noted  . CAD (coronary artery disease) 12/03/2019  . Essential hypertension 07/24/2019  . Alcohol use disorder, mild, abuse 03/25/2019  . Sleep apnea in adult 09/07/2018  . GAD (generalized anxiety disorder) 05/25/2018  . Depression, major, single episode, in partial remission ([place]) 05/25/2018  . S/P CABG (coronary artery bypass graft) 11/16/2017  . Insulin resistance 11/16/2017  . Subclinical hypothyroidism 11/16/2017  . Macular degeneration   . PVC's (premature ventricular contractions) 09/11/2014  . Dyslipidemia, goal LDL below 70 05/24/2014  . Hodgkin disease ([place]) 07/13/1995   Health Maintenance  Topic Date Due  . Hepatitis C Screening  Never done  . COVID-19 Vaccine (3 - Pfizer risk 4-dose series) 10/25/2020 (Originally 12/10/2019)  . COLONOSCOPY (Pts 45-28yrs Insurance coverage will need to be confirmed)  11/04/2025  . TETANUS/TDAP  11/17/2027  . INFLUENZA VACCINE  Completed  . HIV Screening  Completed  . HPV VACCINES  Aged Out   Immunization History  Administered Date(s) Administered  . Influenza,inj,Quad PF,6+ Mos 04/03/2018, 04/03/2019  . Influenza-Unspecified 03/26/2020  . PFIZER(Purple Top)SARS-COV-2 Vaccination 10/22/2019, 11/12/2019  . Tdap 11/16/2017   We updated and reviewed the patient's past history in detail and it is documented below. Allergies: Patient has No Known Allergies. Past Medical History  has a past medical history of CAD (coronary artery disease), s/p stent 2011 and CABG 2015 [facility]), Hodgkin disease [facility]) (1997), Hyperlipidemia, Macular degeneration, and PVC's (premature ventricular contractions). Past Surgical History  Patient  has a past surgical history that includes Coronary artery bypass graft (02/2014) and Cardiac catheterization (08/2009). Social History Patient  reports that he has never smoked. He has quit using smokeless tobacco. He reports current alcohol use of about 3.0 standard drinks of alcohol per  week. He reports that he does not use drugs. Family History family history includes COPD in his father and mother; Cancer in his maternal grandmother and mother; Heart Problems in his brother and brother; Heart attack in his father; Heart disease in his father; Stroke in his mother. Review of Systems: Constitutional: negative for fever or malaise Ophthalmic: negative for photophobia, double vision or loss of vision Cardiovascular: negative for chest pain, dyspnea on exertion, or new LE swelling Respiratory: negative for SOB or persistent cough Gastrointestinal: negative for abdominal pain, change in bowel habits or melena Genitourinary: negative for dysuria or gross hematuria Musculoskeletal: negative for new gait disturbance or muscular weakness Integumentary: negative for new or persistent rashes Neurological: negative for TIA or stroke symptoms Psychiatric: negative for SI or delusions Allergic/Immunologic: negative for hives  Patient Care Team    Relationship Specialty Notifications Start End  Leamon Arnt, MD PCP - General Family Medicine All results, Admissions 12/03/19   Lorretta Harp, MD Consulting Physician Cardiology  11/16/17   Hayden Pedro, MD Consulting Physician Ophthalmology  11/16/17   Volanda Napoleon, MD Consulting Physician Oncology  11/16/17    Objective  Vitals: BP 118/82 (BP Location: Left Arm, Patient Position: Sitting, Cuff Size: Normal)   Pulse 68   Temp (!) 96.9 F (36.1 C) (Temporal)   Ht 5\' 8"  (1.727 m)   Wt 170 lb 12.8 oz (77.5 kg)   SpO2 96%   BMI 25.97 kg/m  General:  Well developed, well nourished, no acute distress  Psych:  Alert and orientedx3,normal mood and affect HEENT:  Normocephalic, atraumatic, non-icteric sclera, PERRL, oropharynx is clear without mass or exudate, supple neck without adenopathy, mass or thyromegaly Cardiovascular:  Normal S1, S2, RRR without gallop, rub or murmur,+2 distal pulses in bilateral upper and lower  extremities. Respiratory:  Good breath sounds bilaterally, CTAB with normal respiratory effort Gastrointestinal: normal bowel sounds, soft, non-tender, no noted masses. No HSM MSK: no deformities, contusions. Joints are without erythema or swelling. Spine and CVA region are nontender Skin:  Warm, no rashes or suspicious lesions noted Neurologic:    Mental status is normal. CN 2-11 are normal. Gross motor and sensory exams are normal. Stable gait. No tremor GU: No inguinal hernias or adenopathy are appreciated bilaterally   Assessment  1. Annual physical exam   2. Coronary artery disease involving native coronary artery of native heart without angina pectoris   3. Essential hypertension   4. GAD (generalized anxiety disorder)   5. Depression, major, single episode, in partial remission ([place])   6. Subclinical hypothyroidism   7. Dyslipidemia, goal LDL below 70   8. Insulin resistance      Plan  Male Wellness Visit:  Age appropriate Health Maintenance and Prevention measures were discussed with patient. Included topics are cancer screening recommendations, ways to keep healthy (see AVS) including dietary and exercise recommendations, regular eye and dental care, use of seat belts, and avoidance of moderate alcohol use and tobacco use.  Screens are up-to-date  BMI: discussed patient's BMI and encouraged positive lifestyle modifications to help get to or maintain a target BMI.  HM needs and immunizations were addressed and ordered. See below for orders. See HM and  immunization section for updates.  First Shingrix given today.    Routine labs and screening tests ordered including cmp, cbc and lipids where appropriate.  Discussed recommendations regarding Vit D and calcium supplementation (see AVS)  Chronic disease f/u and/or acute problem visit: (deemed necessary to be done in addition to the wellness visit):  CAD, hypertension hyperlipidemia: All clinically well controlled.  Recheck  renal function and lipids today.  Continue rosuvastatin 20 mg nightly.  Coreg twice daily, 81 mg aspirin daily.  Sleep apnea: Recommend using CPAP and follow-up with pulmonology  Mood disorder managed by psychiatry.  Reviewed medications, Lexapro and benzo as needed.  Subclinical hypothyroidism for recheck today.  Clinically euthyroid.  Insulin resistance: Discussed indications for treatment.  We will recheck A1c today.  I would favor stopping medication and following.  Follow up: 6 months for hypertension recheck and second Shingrix vaccination  Commons side effects, risks, benefits, and alternatives for medications and treatment plan prescribed today were discussed, and the patient expressed understanding of the given instructions. Patient is instructed to call or message via MyChart if he/she has any questions or concerns regarding our treatment plan. No barriers to understanding were identified. We discussed Red Flag symptoms and signs in detail. Patient expressed understanding regarding what to do in case of urgent or emergency type symptoms.   Medication list was reconciled, printed and provided to the patient in AVS. Patient instructions and summary information was reviewed with the patient as documented in the AVS. This note was prepared with assistance of Dragon voice recognition software. Occasional wrong-word or sound-a-like substitutions may have occurred due to the inherent limitations of voice recognition software  This visit occurred during the SARS-CoV-2 public health emergency.  Safety protocols were in place, including screening questions prior to the visit, additional usage of staff PPE, and extensive cleaning of exam room while observing appropriate contact time as indicated for disinfecting solutions.   Orders Placed This Encounter  Procedures  . CBC with Differential/Platelet  . Comprehensive metabolic panel  . Lipid panel  . T3  . T4, free  . TSH  . Hemoglobin A1c    No orders of the defined types were placed in this encounter.

## 2020-10-09 NOTE — Patient Instructions (Signed)
Please return in 6 months for hypertension follow up.  I will release your lab results to you on your MyChart account with further instructions. Please reply with any questions.   Today you were given your 1st of 2 shingrix vaccination. I will give you your second dose in 6 months at your next visit.   If you have any questions or concerns, please don't hesitate to send me a message via MyChart or call the office at [phone]. Thank you for visiting with [place] today! It's our pleasure caring for you.  Please do these things to maintain good health!   Exercise at least 30-45 minutes a day,  4-5 days a week.   Eat a low-fat diet with lots of fruits and vegetables, up to 7-9 servings per day.  Drink plenty of water daily. Try to drink 8 8oz glasses per day.  Seatbelts can save your life. Always wear your seatbelt.  Place Smoke Detectors on every level of your home and check batteries every year.  Eye Doctor - have an eye exam every 1-2 years  Safe sex - use condoms to protect yourself from STDs if you could be exposed to these types of infections.  Avoid heavy alcohol use. If you drink, keep it to less than 2 drinks/day and not every day.  [place].  Choose someone you trust that could speak for you if you became unable to speak for yourself.  Depression is common in our stressful world.If you're feeling down or losing interest in things you normally enjoy, please come in for a visit.

## 2020-10-10 LAB — HEPATITIS C ANTIBODY
Hepatitis C Ab: NONREACTIVE
SIGNAL TO CUT-OFF: 0 (ref <1.00)

## 2020-10-10 LAB — T3: T3, Total: 91 ng/dL (ref 76–181)

## 2020-10-13 NOTE — Progress Notes (Signed)
Please call patient: I have reviewed his/her lab results. Lab tests show that thyroid levels remain in the low normal range. I will check every 6 months and discuss starting medications if they worsen.  Liver tests remain elevated: need to decrease alcohol use further if possible. Cholesterol and sugar tests are ok.

## 2020-10-16 ENCOUNTER — Encounter (INDEPENDENT_AMBULATORY_CARE_PROVIDER_SITE_OTHER): Payer: BC Managed Care – PPO | Admitting: Ophthalmology

## 2020-10-16 ENCOUNTER — Other Ambulatory Visit: Payer: Self-pay

## 2020-10-16 DIAGNOSIS — H353221 Exudative age-related macular degeneration, left eye, with active choroidal neovascularization: Secondary | ICD-10-CM | POA: Diagnosis not present

## 2020-10-16 DIAGNOSIS — H2513 Age-related nuclear cataract, bilateral: Secondary | ICD-10-CM

## 2020-10-16 DIAGNOSIS — H43813 Vitreous degeneration, bilateral: Secondary | ICD-10-CM

## 2020-10-16 DIAGNOSIS — I10 Essential (primary) hypertension: Secondary | ICD-10-CM

## 2020-10-16 DIAGNOSIS — H353111 Nonexudative age-related macular degeneration, right eye, early dry stage: Secondary | ICD-10-CM | POA: Diagnosis not present

## 2020-10-16 DIAGNOSIS — B399 Histoplasmosis, unspecified: Secondary | ICD-10-CM

## 2020-10-16 DIAGNOSIS — H35033 Hypertensive retinopathy, bilateral: Secondary | ICD-10-CM

## 2020-10-16 DIAGNOSIS — H32 Chorioretinal disorders in diseases classified elsewhere: Secondary | ICD-10-CM

## 2020-10-16 DIAGNOSIS — H348322 Tributary (branch) retinal vein occlusion, left eye, stable: Secondary | ICD-10-CM

## 2020-11-06 ENCOUNTER — Other Ambulatory Visit: Payer: Self-pay | Admitting: Family Medicine

## 2020-11-06 DIAGNOSIS — E8881 Metabolic syndrome: Secondary | ICD-10-CM

## 2020-11-06 DIAGNOSIS — Z951 Presence of aortocoronary bypass graft: Secondary | ICD-10-CM

## 2020-11-06 DIAGNOSIS — G4730 Sleep apnea, unspecified: Secondary | ICD-10-CM

## 2020-11-06 DIAGNOSIS — F324 Major depressive disorder, single episode, in partial remission: Secondary | ICD-10-CM

## 2020-11-06 DIAGNOSIS — R0683 Snoring: Secondary | ICD-10-CM

## 2020-11-06 DIAGNOSIS — E038 Other specified hypothyroidism: Secondary | ICD-10-CM

## 2020-11-06 DIAGNOSIS — I25700 Atherosclerosis of coronary artery bypass graft(s), unspecified, with unstable angina pectoris: Secondary | ICD-10-CM

## 2020-11-25 ENCOUNTER — Encounter: Payer: Self-pay | Admitting: Cardiovascular Disease

## 2020-11-25 ENCOUNTER — Ambulatory Visit: Payer: BC Managed Care – PPO | Admitting: Cardiovascular Disease

## 2020-11-25 ENCOUNTER — Other Ambulatory Visit: Payer: Self-pay

## 2020-11-25 DIAGNOSIS — E782 Mixed hyperlipidemia: Secondary | ICD-10-CM

## 2020-11-25 DIAGNOSIS — I493 Ventricular premature depolarization: Secondary | ICD-10-CM

## 2020-11-25 DIAGNOSIS — Z9989 Dependence on other enabling machines and devices: Secondary | ICD-10-CM

## 2020-11-25 DIAGNOSIS — Z951 Presence of aortocoronary bypass graft: Secondary | ICD-10-CM

## 2020-11-25 DIAGNOSIS — G4733 Obstructive sleep apnea (adult) (pediatric): Secondary | ICD-10-CM | POA: Diagnosis not present

## 2020-11-25 DIAGNOSIS — I10 Essential (primary) hypertension: Secondary | ICD-10-CM

## 2020-11-25 NOTE — Assessment & Plan Note (Signed)
History of CAD status post anterior STEMI 08/28/2009 treated with Xience drug-eluting stenting placed in his proximal LAD.  He had cardiac catheterization performed August 2015 which led to CABG 02/27/2014 with a LIMA to his LAD, vein to the ramus branch, obtuse marginal branch and RCA.  EF at that time was 35% which ultimately improved and normal by 2D echocardiogram.  He exercises on his Peloton.  Denies chest pain or shortness of breath.

## 2020-11-25 NOTE — Assessment & Plan Note (Signed)
History of hyperlipidemia on statin therapy with lipid profile performed 10/09/2020 revealing total cholesterol 103, LDL 48 and HDL 44.

## 2020-11-25 NOTE — Assessment & Plan Note (Addendum)
History of obstructive sleep apnea on CPAP which unfortunately has not used in the last 3 months and does complain of daytime somnolence.  I have asked him to be compliant with his CPAP.

## 2020-11-25 NOTE — Assessment & Plan Note (Addendum)
History of PVCs on event monitor in the past which no longer an issue in which he does not feel.

## 2020-11-25 NOTE — Assessment & Plan Note (Signed)
History of essential hypertension blood pressure measured today 140/98.  He is on carvedilol.

## 2020-11-25 NOTE — Progress Notes (Signed)
11/25/2020 Shane [name]   [birth year]  [id number]  Primary Physician Leamon Arnt, MD Primary Cardiologist: Lorretta Harp MD [name], [place]  HPI:  Shane [name] is a 55 y.o.  thin appearing divorced Caucasian male father of 2 childrenhis wife Shane [name] is also a patient of mine unfortunately recently lost her father several days ago.I last saw him in the office  10/19/2019.  He works as an Sales promotion account executive. His primary care physician is Dr. Cecille Amsterdam. He recently was married earlier this month and he and [name] honeymoon in [place]. His cardiac risk factor profile is remarkable for hyperlipidemia and family history the father who had bypass surgery at age 59 and a brother who had bypass surgery as well. He suffered an anterior wall myocardial infarction 08/28/09 with a Xience DES stent placed in his proximal LAD. Because of recurrent symptoms he underwent catheterization in August and ultimately coronary bypass grafting 02/27/14 with a LIMA to his LAD, a vein to ramus branch, obtuse marginal branch and the RCA. His ejection fraction was 35%. He recuperated nicely although he did not participate in cardiac rehabilitation. A 2-D echocardiogram performed 08/27/14 revealed an improvement in his ejection fraction up to 50-55% with anterolateral wall motion abnormality. While honeymooning in [place] he noticed prolonged recovery from exercise activity as well as "just not feeling right". He denies chest pain.I performed a Myoview stress test on him after that on 06/28/16 which was low risk and nonischemic and 2-D echo that revealed preserved LV function with EF of 50-55%. Those symptoms have since subsided.  Since I saw him a year ago he is done well.  He is exercising on his Peloton.  He denies chest pain or shortness of breath.  He was noticing some extra beats on his Kardia mobile and an event monitor was performed that showed frequent PVCs.  He does have sleep apnea  but has not worn his CPAP in last 3 months and does complain of daytime somnolence.       No Known Allergies  Social History   Socioeconomic History  . Marital status: Married    Spouse name: Not on file  . Number of children: Not on file  . Years of education: Not on file  . Highest education level: Not on file  Occupational History  . Not on file  Tobacco Use  . Smoking status: Never Smoker  . Smokeless tobacco: Former Systems developer  . Tobacco comment: a long time ago  Vaping Use  . Vaping Use: Never used  Substance and Sexual Activity  . Alcohol use: Yes    Alcohol/week: 3.0 standard drinks    Types: 3 Cans of beer per week    Comment: sometimes 3 beers daily  . Drug use: No  . Sexual activity: Yes  Other Topics Concern  . Not on file  Social History Narrative  . Not on file   Social Determinants of Health   Financial Resource Strain: Not on file  Food Insecurity: Not on file  Transportation Needs: Not on file  Physical Activity: Not on file  Stress: Not on file  Social Connections: Not on file  Intimate Partner Violence: Not on file     Review of Systems: General: negative for chills, fever, night sweats or weight changes.  Cardiovascular: negative for chest pain, dyspnea on exertion, edema, orthopnea, palpitations, paroxysmal nocturnal dyspnea or shortness of breath Dermatological: negative for rash Respiratory: negative for cough or wheezing  Urologic: negative for hematuria Abdominal: negative for nausea, vomiting, diarrhea, bright red blood per rectum, melena, or hematemesis Neurologic: negative for visual changes, syncope, or dizziness All other systems reviewed and are otherwise negative except as noted above.    Blood pressure (!) 140/98, pulse (!) 59, height 5' 7.5" (1.715 m), weight 165 lb (74.8 kg), SpO2 97 %.  General appearance: alert and no distress Neck: no adenopathy, no carotid bruit, no JVD, supple, symmetrical, trachea midline and thyroid not  enlarged, symmetric, no tenderness/mass/nodules Lungs: clear to auscultation bilaterally Heart: regular rate and rhythm, S1, S2 normal, no murmur, click, rub or gallop Extremities: extremities normal, atraumatic, no cyanosis or edema Pulses: 2+ and symmetric Skin: Skin color, texture, turgor normal. No rashes or lesions Neurologic: Alert and oriented X 3, normal strength and tone. Normal symmetric reflexes. Normal coordination and gait  EKG sinus bradycardia 59 with septal Q waves and low limb voltage.  There was first-degree AV block.  I personally reviewed this EKG.  ASSESSMENT AND PLAN:   Mixed hyperlipidemia History of hyperlipidemia on statin therapy with lipid profile performed 10/09/2020 revealing total cholesterol 103, LDL 48 and HDL 44.  PVC's (premature ventricular contractions) History of PVCs on event monitor in the past which no longer an issue in which he does not feel.  S/P CABG (coronary artery bypass graft) History of CAD status post anterior STEMI 08/28/2009 treated with Xience drug-eluting stenting placed in his proximal LAD.  He had cardiac catheterization performed August 2015 which led to CABG 02/27/2014 with a LIMA to his LAD, vein to the ramus branch, obtuse marginal branch and RCA.  EF at that time was 35% which ultimately improved and normal by 2D echocardiogram.  He exercises on his Peloton.  Denies chest pain or shortness of breath.  OSA on CPAP History of obstructive sleep apnea on CPAP which unfortunately has not used in the last 3 months and does complain of daytime somnolence.  I have asked him to be compliant with his CPAP.  Essential hypertension History of essential hypertension blood pressure measured today 140/98.  He is on carvedilol.      Lorretta Harp MD FACP,FACC,FAHA, [facility] 11/25/2020 4:42 PM

## 2020-11-25 NOTE — Patient Instructions (Addendum)

## 2020-12-03 ENCOUNTER — Other Ambulatory Visit: Payer: Self-pay | Admitting: Family Medicine

## 2020-12-03 ENCOUNTER — Encounter (INDEPENDENT_AMBULATORY_CARE_PROVIDER_SITE_OTHER): Payer: BC Managed Care – PPO | Admitting: Ophthalmology

## 2020-12-03 ENCOUNTER — Other Ambulatory Visit: Payer: Self-pay

## 2020-12-03 DIAGNOSIS — H43813 Vitreous degeneration, bilateral: Secondary | ICD-10-CM

## 2020-12-03 DIAGNOSIS — F324 Major depressive disorder, single episode, in partial remission: Secondary | ICD-10-CM

## 2020-12-03 DIAGNOSIS — I10 Essential (primary) hypertension: Secondary | ICD-10-CM

## 2020-12-03 DIAGNOSIS — H353111 Nonexudative age-related macular degeneration, right eye, early dry stage: Secondary | ICD-10-CM | POA: Diagnosis not present

## 2020-12-03 DIAGNOSIS — G4730 Sleep apnea, unspecified: Secondary | ICD-10-CM

## 2020-12-03 DIAGNOSIS — H35033 Hypertensive retinopathy, bilateral: Secondary | ICD-10-CM | POA: Diagnosis not present

## 2020-12-03 DIAGNOSIS — E8881 Metabolic syndrome: Secondary | ICD-10-CM

## 2020-12-03 DIAGNOSIS — E038 Other specified hypothyroidism: Secondary | ICD-10-CM

## 2020-12-03 DIAGNOSIS — H353221 Exudative age-related macular degeneration, left eye, with active choroidal neovascularization: Secondary | ICD-10-CM | POA: Diagnosis not present

## 2020-12-03 DIAGNOSIS — R0683 Snoring: Secondary | ICD-10-CM

## 2020-12-03 DIAGNOSIS — Z951 Presence of aortocoronary bypass graft: Secondary | ICD-10-CM

## 2020-12-03 DIAGNOSIS — I25700 Atherosclerosis of coronary artery bypass graft(s), unspecified, with unstable angina pectoris: Secondary | ICD-10-CM

## 2021-01-01 ENCOUNTER — Other Ambulatory Visit: Payer: Self-pay | Admitting: Cardiovascular Disease

## 2021-01-01 ENCOUNTER — Other Ambulatory Visit: Payer: Self-pay | Admitting: Adult Health

## 2021-01-01 DIAGNOSIS — F321 Major depressive disorder, single episode, moderate: Secondary | ICD-10-CM

## 2021-01-01 DIAGNOSIS — E785 Hyperlipidemia, unspecified: Secondary | ICD-10-CM

## 2021-01-01 DIAGNOSIS — F39 Unspecified mood [affective] disorder: Secondary | ICD-10-CM

## 2021-01-01 DIAGNOSIS — F411 Generalized anxiety disorder: Secondary | ICD-10-CM

## 2021-01-19 DIAGNOSIS — F4010 Social phobia, unspecified: Secondary | ICD-10-CM | POA: Diagnosis not present

## 2021-01-19 DIAGNOSIS — F3110 Bipolar disorder, current episode manic without psychotic features, unspecified: Secondary | ICD-10-CM | POA: Diagnosis not present

## 2021-01-19 DIAGNOSIS — F902 Attention-deficit hyperactivity disorder, combined type: Secondary | ICD-10-CM | POA: Diagnosis not present

## 2021-01-19 DIAGNOSIS — F411 Generalized anxiety disorder: Secondary | ICD-10-CM | POA: Diagnosis not present

## 2021-01-20 ENCOUNTER — Other Ambulatory Visit: Payer: Self-pay

## 2021-01-20 ENCOUNTER — Encounter (INDEPENDENT_AMBULATORY_CARE_PROVIDER_SITE_OTHER): Payer: BC Managed Care – PPO | Admitting: Ophthalmology

## 2021-01-20 DIAGNOSIS — H353111 Nonexudative age-related macular degeneration, right eye, early dry stage: Secondary | ICD-10-CM

## 2021-01-20 DIAGNOSIS — H35033 Hypertensive retinopathy, bilateral: Secondary | ICD-10-CM

## 2021-01-20 DIAGNOSIS — I10 Essential (primary) hypertension: Secondary | ICD-10-CM

## 2021-01-20 DIAGNOSIS — H353221 Exudative age-related macular degeneration, left eye, with active choroidal neovascularization: Secondary | ICD-10-CM

## 2021-01-20 DIAGNOSIS — H348322 Tributary (branch) retinal vein occlusion, left eye, stable: Secondary | ICD-10-CM

## 2021-01-20 DIAGNOSIS — H43813 Vitreous degeneration, bilateral: Secondary | ICD-10-CM

## 2021-01-27 ENCOUNTER — Telehealth (INDEPENDENT_AMBULATORY_CARE_PROVIDER_SITE_OTHER): Payer: BC Managed Care – PPO | Admitting: Adult Health

## 2021-01-27 ENCOUNTER — Encounter: Payer: Self-pay | Admitting: Adult Health

## 2021-01-27 DIAGNOSIS — F411 Generalized anxiety disorder: Secondary | ICD-10-CM | POA: Diagnosis not present

## 2021-01-27 DIAGNOSIS — F410 Panic disorder [episodic paroxysmal anxiety] without agoraphobia: Secondary | ICD-10-CM | POA: Diagnosis not present

## 2021-01-27 DIAGNOSIS — F321 Major depressive disorder, single episode, moderate: Secondary | ICD-10-CM

## 2021-01-27 DIAGNOSIS — F39 Unspecified mood [affective] disorder: Secondary | ICD-10-CM | POA: Diagnosis not present

## 2021-01-27 MED ORDER — LORAZEPAM 0.5 MG PO TABS: 0.5000 mg | ORAL_TABLET | Freq: Two times a day (BID) | ORAL | 2 refills | Status: DC

## 2021-01-27 MED ORDER — ARIPIPRAZOLE 5 MG PO TABS: 5.0000 mg | ORAL_TABLET | Freq: Every day | ORAL | 1 refills | Status: DC

## 2021-01-27 MED ORDER — ESCITALOPRAM OXALATE 20 MG PO TABS: 20.0000 mg | ORAL_TABLET | Freq: Every day | ORAL | 1 refills | Status: DC

## 2021-01-27 NOTE — Progress Notes (Signed)
DEWELL MONNIER [id number] 1966/02/21 55 y.o.  Virtual Visit via Video Note  I connected with pt @ on 01/27/21 at  8:20 AM EDT by a video enabled telemedicine application and verified that I am speaking with the correct person using two identifiers.   I discussed the limitations of evaluation and management by telemedicine and the availability of in person appointments. The patient expressed understanding and agreed to proceed.  I discussed the assessment and treatment plan with the patient. The patient was provided an opportunity to ask questions and all were answered. The patient agreed with the plan and demonstrated an understanding of the instructions.   The patient was advised to call back or seek an in-person evaluation if the symptoms worsen or if the condition fails to improve as anticipated.  I provided 30 minutes of non-face-to-face time during this encounter.  The patient was located at home.  The provider was located at [place].   Aloha Gell, NP   Subjective:   Patient ID:  Shane [name] is a 55 y.o. (DOB [birth year]) male.  Chief Complaint: No chief complaint on file.   HPI Shane [name] presents for follow-up of GAD, MDD, insomnia, mood disorder, panic attacks.   Describes mood today as "ok". Pleasant. Mood symptoms - denies depression, anxiety, and irritability. Mood level - "no highs or lows". Stating "I'm doing great".  He and wife doing well. Stable interest and motivation. Taking medications as prescribed.  Energy levels stable. Active, does not have a regular exercise routine.  Enjoys some usual interests and activities. Married. Lives with wife. Has 2 grown sons. Family in [place].  Appetite adequate. Weight stable - 170 pounds.  Sleeps well most nights. Averages 7 hours. Has not been using CPAP machine. Focus and concentration stable. Completing tasks. Managing aspects of household. Work going well. Denies SI or HI.  Denies AH or  VH.     Review of Systems:  Review of Systems  Musculoskeletal:  Negative for gait problem.  Neurological:  Negative for tremors.  Psychiatric/Behavioral:         Please refer to HPI   Medications: I have reviewed the patient's current medications.  Current Outpatient Medications  Medication Sig Dispense Refill   ARIPiprazole (ABILIFY) 5 MG tablet Take 1 tablet (5 mg total) by mouth daily. 90 tablet 1   aspirin 81 MG tablet Take 81 mg by mouth daily.     BESIVANCE 0.6 % SUSP as needed.   1   carvedilol (COREG) 6.25 MG tablet Take 1 tablet by mouth twice daily. **Patient needs office visit for future refills.** 180 tablet 3   escitalopram (LEXAPRO) 20 MG tablet Take 1 tablet (20 mg total) by mouth daily. 90 tablet 1   ibuprofen (ADVIL,MOTRIN) 200 MG tablet Take 200 mg by mouth every 6 (six) hours as needed.     LORazepam (ATIVAN) 0.5 MG tablet Take 1 tablet (0.5 mg total) by mouth 2 (two) times daily. 60 tablet 2   metFORMIN (GLUCOPHAGE-XR) 750 MG 24 hr tablet Take 1 tablet by mouth daily with breakfast. 30 tablet 2   nitroGLYCERIN (NITROSTAT) 0.4 MG SL tablet Place 1 tablet (0.4 mg total) under the tongue every 5 (five) minutes as needed for chest pain. 25 tablet 6   NONFORMULARY OR COMPOUNDED ITEM Shots for macular degeneration left eye     rosuvastatin (CRESTOR) 20 MG tablet Take 1 tablet by mouth daily. **Please call to schedule office visit prior to further refills** 90  tablet 3   No current facility-administered medications for this visit.    Medication Side Effects: None  Allergies: No Known Allergies  Past Medical History:  Diagnosis Date   CAD (coronary artery disease), s/p stent 2011 and CABG 2015 [facility])    Hodgkin disease [facility]) 1997   Hyperlipidemia    Macular degeneration    PVC's (premature ventricular contractions)     Family History  Problem Relation Age of Onset   Stroke Mother    Cancer Mother    COPD Mother    Heart disease Father     COPD Father    Heart attack Father    Heart Problems Brother        heart valve replaced at 47 years old   Heart Problems Brother        MI at age 2; CABG at age 51   Cancer Maternal Grandmother    Colon cancer Neg Hx     Social History   Socioeconomic History   Marital status: Married    Spouse name: Not on file   Number of children: Not on file   Years of education: Not on file   Highest education level: Not on file  Occupational History   Not on file  Tobacco Use   Smoking status: Never   Smokeless tobacco: Former   Tobacco comments:    a long time ago  Vaping Use   Vaping Use: Never used  Substance and Sexual Activity   Alcohol use: Yes    Alcohol/week: 3.0 standard drinks    Types: 3 Cans of beer per week    Comment: sometimes 3 beers daily   Drug use: No   Sexual activity: Yes  Other Topics Concern   Not on file  Social History Narrative   Not on file   Social Determinants of Health   Financial Resource Strain: Not on file  Food Insecurity: Not on file  Transportation Needs: Not on file  Physical Activity: Not on file  Stress: Not on file  Social Connections: Not on file  Intimate Partner Violence: Not on file    Past Medical History, Surgical history, Social history, and Family history were reviewed and updated as appropriate.   Please see review of systems for further details on the patient's review from today.   Objective:   Physical Exam:  There were no vitals taken for this visit.  Physical Exam Constitutional:      General: He is not in acute distress. Musculoskeletal:        General: No deformity.  Neurological:     Mental Status: He is alert and oriented to person, place, and time.     Coordination: Coordination normal.  Psychiatric:        Attention and Perception: Attention and perception normal. He does not perceive auditory or visual hallucinations.        Mood and Affect: Mood normal. Mood is not anxious or depressed. Affect is  not labile, blunt, angry or inappropriate.        Speech: Speech normal.        Behavior: Behavior normal.        Thought Content: Thought content normal. Thought content is not paranoid or delusional. Thought content does not include homicidal or suicidal ideation. Thought content does not include homicidal or suicidal plan.        Cognition and Memory: Cognition and memory normal.        Judgment: Judgment normal.  Comments: Insight intact    Lab Review:     Component Value Date/Time   NA 140 10/09/2020 0817   NA 141 12/24/2015 1051   K 4.7 10/09/2020 0817   K 4.6 12/24/2015 1051   CL 106 10/09/2020 0817   CO2 27 10/09/2020 0817   CO2 28 12/24/2015 1051   GLUCOSE 126 (H) 10/09/2020 0817   GLUCOSE 95 12/24/2015 1051   BUN 20 10/09/2020 0817   BUN 14.6 12/24/2015 1051   CREATININE 1.12 10/09/2020 0817   CREATININE 1.1 12/24/2015 1051   CALCIUM 9.2 10/09/2020 0817   CALCIUM 9.8 12/24/2015 1051   PROT 6.3 10/09/2020 0817   PROT 7.1 10/19/2019 1146   PROT 7.6 12/24/2015 1051   ALBUMIN 4.3 10/09/2020 0817   ALBUMIN 4.8 10/19/2019 1146   ALBUMIN 4.5 12/24/2015 1051   AST 68 (H) 10/09/2020 0817   AST 25 12/24/2015 1051   ALT 79 (H) 10/09/2020 0817   ALT 34 12/24/2015 1051   ALKPHOS 73 10/09/2020 0817   ALKPHOS 45 12/24/2015 1051   BILITOT 1.0 10/09/2020 0817   BILITOT 1.3 (H) 10/19/2019 1146   BILITOT 0.99 12/24/2015 1051       Component Value Date/Time   WBC 5.2 10/09/2020 0817   RBC 4.36 10/09/2020 0817   HGB 14.0 10/09/2020 0817   HGB 15.7 12/24/2015 1050   HCT 40.9 10/09/2020 0817   HCT 44.7 12/24/2015 1050   PLT 225.0 10/09/2020 0817   PLT 263 12/24/2015 1050   MCV 93.9 10/09/2020 0817   MCV 93 12/24/2015 1050   MCH 32.6 12/24/2015 1050   MCHC 34.1 10/09/2020 0817   RDW 13.6 10/09/2020 0817   RDW 12.9 12/24/2015 1050   LYMPHSABS 1.2 10/09/2020 0817   LYMPHSABS 1.3 12/24/2015 1050   MONOABS 0.5 10/09/2020 0817   EOSABS 0.1 10/09/2020 0817   EOSABS 0.1  12/24/2015 1050   BASOSABS 0.1 10/09/2020 0817   BASOSABS 0.0 12/24/2015 1050    No results found for: POCLITH, LITHIUM   No results found for: PHENYTOIN, PHENOBARB, VALPROATE, CBMZ   .res Assessment: Plan:    Plan:  1. Continue Lexapro 20mg  daily 2. Continue Ativan 0.5mg  BID - takes occasionally. 3. Continue Abilify 5mg  daily.  Continue therapy with Rinaldo Cloud.   RTC 6 months  Patient advised to contact office with any questions, adverse effects, or acute worsening in signs and symptoms.  Discussed potential benefits, risk, and side effects of benzodiazepines to include potential risk of tolerance and dependence, as well as possible drowsiness.  Advised patient not to drive if experiencing drowsiness and to take lowest possible effective dose to minimize risk of dependence and tolerance.   Discussed potential metabolic side effects associated with atypical antipsychotics, as well as potential risk for movement side effects. Advised pt to contact office if movement side effects occur.  Diagnoses and all orders for this visit: Diagnoses and all orders for this visit:  Generalized anxiety disorder -     ARIPiprazole (ABILIFY) 5 MG tablet; Take 1 tablet (5 mg total) by mouth daily.  Episodic mood disorder (HCC) -     ARIPiprazole (ABILIFY) 5 MG tablet; Take 1 tablet (5 mg total) by mouth daily.  Depression, major, single episode, moderate (HCC) Comments: Marriage issues. Wife with mood disorder. Orders: -     escitalopram (LEXAPRO) 20 MG tablet; Take 1 tablet (20 mg total) by mouth daily.  Anxiety attack Comments: Ativan low dose, sparingly.  Orders: -     LORazepam (ATIVAN)  0.5 MG tablet; Take 1 tablet (0.5 mg total) by mouth 2 (two) times daily.    Please see After Visit Summary for patient specific instructions.  Future Appointments  Date Time Provider [place]  03/10/2021  8:45 AM Hayden Pedro, MD [facility] None  04/10/2021  8:30 AM Leamon Arnt, MD  [facility]    No orders of the defined types were placed in this encounter.     -------------------------------

## 2021-02-28 ENCOUNTER — Other Ambulatory Visit: Payer: Self-pay | Admitting: Family Medicine

## 2021-02-28 DIAGNOSIS — Z951 Presence of aortocoronary bypass graft: Secondary | ICD-10-CM

## 2021-02-28 DIAGNOSIS — R0683 Snoring: Secondary | ICD-10-CM

## 2021-02-28 DIAGNOSIS — E8881 Metabolic syndrome: Secondary | ICD-10-CM

## 2021-02-28 DIAGNOSIS — F324 Major depressive disorder, single episode, in partial remission: Secondary | ICD-10-CM

## 2021-02-28 DIAGNOSIS — G4730 Sleep apnea, unspecified: Secondary | ICD-10-CM

## 2021-02-28 DIAGNOSIS — E038 Other specified hypothyroidism: Secondary | ICD-10-CM

## 2021-02-28 DIAGNOSIS — I25700 Atherosclerosis of coronary artery bypass graft(s), unspecified, with unstable angina pectoris: Secondary | ICD-10-CM

## 2021-03-10 ENCOUNTER — Encounter (INDEPENDENT_AMBULATORY_CARE_PROVIDER_SITE_OTHER): Payer: BC Managed Care – PPO | Admitting: Ophthalmology

## 2021-03-10 ENCOUNTER — Other Ambulatory Visit: Payer: Self-pay

## 2021-03-10 DIAGNOSIS — H353111 Nonexudative age-related macular degeneration, right eye, early dry stage: Secondary | ICD-10-CM

## 2021-03-10 DIAGNOSIS — I10 Essential (primary) hypertension: Secondary | ICD-10-CM | POA: Diagnosis not present

## 2021-03-10 DIAGNOSIS — H34832 Tributary (branch) retinal vein occlusion, left eye, with macular edema: Secondary | ICD-10-CM | POA: Diagnosis not present

## 2021-03-10 DIAGNOSIS — H353221 Exudative age-related macular degeneration, left eye, with active choroidal neovascularization: Secondary | ICD-10-CM

## 2021-03-10 DIAGNOSIS — H35033 Hypertensive retinopathy, bilateral: Secondary | ICD-10-CM

## 2021-03-10 DIAGNOSIS — H43813 Vitreous degeneration, bilateral: Secondary | ICD-10-CM

## 2021-04-10 ENCOUNTER — Encounter: Payer: Self-pay | Admitting: Family Medicine

## 2021-04-10 ENCOUNTER — Other Ambulatory Visit: Payer: Self-pay

## 2021-04-10 ENCOUNTER — Ambulatory Visit: Payer: BC Managed Care – PPO | Admitting: Family Medicine

## 2021-04-10 VITALS — BP 147/91 | HR 60 | Temp 97.6°F | Ht 67.5 in | Wt 162.0 lb

## 2021-04-10 DIAGNOSIS — Z23 Encounter for immunization: Secondary | ICD-10-CM

## 2021-04-10 DIAGNOSIS — N5089 Other specified disorders of the male genital organs: Secondary | ICD-10-CM

## 2021-04-10 DIAGNOSIS — I10 Essential (primary) hypertension: Secondary | ICD-10-CM

## 2021-04-10 DIAGNOSIS — R7989 Other specified abnormal findings of blood chemistry: Secondary | ICD-10-CM

## 2021-04-10 DIAGNOSIS — E038 Other specified hypothyroidism: Secondary | ICD-10-CM | POA: Diagnosis not present

## 2021-04-10 DIAGNOSIS — E8881 Metabolic syndrome: Secondary | ICD-10-CM

## 2021-04-10 DIAGNOSIS — I493 Ventricular premature depolarization: Secondary | ICD-10-CM

## 2021-04-10 DIAGNOSIS — F1010 Alcohol abuse, uncomplicated: Secondary | ICD-10-CM

## 2021-04-10 LAB — TSH: TSH: 8.54 u[IU]/mL — ABNORMAL HIGH (ref 0.35–5.50)

## 2021-04-10 LAB — COMPREHENSIVE METABOLIC PANEL
ALT: 48 U/L (ref 0–53)
AST: 35 U/L (ref 0–37)
Albumin: 4.3 g/dL (ref 3.5–5.2)
Alkaline Phosphatase: 246 U/L — ABNORMAL HIGH (ref 39–117)
BUN: 15 mg/dL (ref 6–23)
CO2: 26 mEq/L (ref 19–32)
Calcium: 9.6 mg/dL (ref 8.4–10.5)
Chloride: 102 mEq/L (ref 96–112)
Creatinine, Ser: 0.96 mg/dL (ref 0.40–1.50)
GFR: 88.45 mL/min (ref >60.00)
Glucose, Bld: 102 mg/dL — ABNORMAL HIGH (ref 70–99)
Potassium: 4.1 mEq/L (ref 3.5–5.1)
Sodium: 140 mEq/L (ref 135–145)
Total Bilirubin: 2.1 mg/dL — ABNORMAL HIGH (ref 0.2–1.2)
Total Protein: 7.1 g/dL (ref 6.0–8.3)

## 2021-04-10 LAB — HEMOGLOBIN A1C: Hgb A1c MFr Bld: 5.9 % (ref 4.6–6.5)

## 2021-04-10 LAB — T4, FREE: Free T4: 0.88 ng/dL (ref 0.60–1.60)

## 2021-04-10 LAB — GAMMA GT: GGT: 709 U/L — ABNORMAL HIGH (ref 7–51)

## 2021-04-10 NOTE — Patient Instructions (Addendum)
Please return in 6 months for your annual complete physical; please come fasting.   Please check your blood pressure readings at home; if they are running > 135/85 consistently, your medications will need adjusting; please schedule an office visit for this if needed.  We will call you with an appointment for an ultrasound as discussed.   I will release your lab results to you on your MyChart account with further instructions. Please reply with any questions.   Today you were given your flu and 2nd Shingrix vaccinations.    If you have any questions or concerns, please don't hesitate to send me a message via MyChart or call the office at [phone]. Thank you for visiting with [place] today! It's our pleasure caring for you.   Scrotal Swelling Scrotal swelling is a condition in which the sac of skin that contains the testicles, blood vessels, and structures that help deliver sperm and semen (scrotum) is enlarged or swollen. This can happen on one or both sides of the scrotum. Many things can cause the scrotum to enlarge or swell, including: Fluid around the testicle (hydrocele). A weakened area in the muscles around the groin (hernia). An enlarged vein around the testicle. An injury. An infection. Certain medical treatments. Certain medical conditions, such as congestive heart failure. A recent genital surgery or procedure. A twisting of the spermatic cord that cuts off blood supply (testicular torsion). Testicular cancer. Scrotal swelling can happen along with scrotal pain. Follow these instructions at home: Activity Rest as told by your health care provider. The best position is to lie down. Do not lift anything that is heavier than 5 lb (2.3 kg), or the limit that you are told, until your health care provider says that it is safe. Avoid sexual activity until your health care provider says that it is safe. General instructions Take over-the-counter and prescription medicines only as told  by your health care provider. Perform a monthly self-exam of the scrotum and penis. Feel for changes. Ask your health care provider how to perform a monthly self-exam if you are unsure. Keep all follow-up visits. This is important. Managing pain, stiffness, and swelling If directed, put ice on the affected area. To do this: Put ice in a plastic bag. Place a towel between your skin and the bag. Leave the ice on for 20 minutes, 2-3 times a day. Remove the ice if your skin turns bright red. This is very important. If you cannot feel pain, heat, or cold, you have a greater risk of damage to the area. Place a rolled towel under your testicles for support or use underwear with a supportive pouch. Wear an athletic support cup or scrotal support, such as a jock strap, for comfort. Contact a health care provider if: You have sudden pain that is persistent and does not improve. You have a heavy feeling or notice fluid in the scrotum. You have pain or burning while urinating. You have blood in your urine or semen. You feel a lump around the testicle. You notice that one testicle is larger than the other. Keep in mind that a small difference in size is normal. You have a persistent dull ache or pain in your groin or scrotum. Get help right away if: The pain does not go away. The pain becomes severe. You have a fever or chills. You have pain or vomiting that cannot be controlled. One or both sides of the scrotum are very red and swollen. There is redness spreading upward from  your scrotum to your abdomen or downward from your scrotum to your thighs. Summary Scrotal swelling is a condition in which the sac of skin that contains the testicles, blood vessels, and structures that help deliver the sperm and semen (scrotum) is enlarged or swollen. Many things can cause the scrotum to swell, including fluid around the testicle (hydrocele), a weakened area in the muscles around the groin (hernia), and an  enlarged vein around the testicle. Icing the scrotum or using underwear with a supportive pouch may help reduce swelling and pain. Contact a health care provider if you develop scrotal pain that is sudden and persistent, you have pain while urinating, you feel a lump around the testicle, or you notice blood in your urine or semen. Get help right away if you have uncontrolled pain or vomiting, a very red and swollen scrotum, or a fever or chills. This information is not intended to replace advice given to you by your health care provider. Make sure you discuss any questions you have with your health care provider. Document Revised: 02/26/2020 Document Reviewed: 02/26/2020 Elsevier Patient Education  2022 [employer].

## 2021-04-10 NOTE — Progress Notes (Signed)
Subjective  CC:  Chief Complaint  Patient presents with   Hypertension    Taking medication as prescribed    HPI: Shane [name] is a 55 y.o. male who presents to the office today to address the problems listed above in the chief complaint. Hypertension f/u: Control is good . Pt reports he is doing well. taking medications as instructed, no medication side effects noted, no TIAs, no chest pain on exertion, no dyspnea on exertion, no swelling of ankles. He denies adverse effects from his BP medications. Compliance with medication is good. Carvedilol bid CAD: stable per cards. H/o cabg Insulin resistance on metformin 750 daily w/o sxs of hyperglycemia Elevated liver tests with regular alcohol consumption. Daily drinker. Denies addiction. No abdominal pain or swelling but reports swollen scrotum x several months. Nonpainful. Nl GU function. Denies palpable mass.  PVCs w/ rare palpitation. Has had evaluation by cards.  HM due flu shot   Assessment  1. Essential hypertension   2. Elevated liver function tests   3. Subclinical hypothyroidism   4. Insulin resistance   5. Testicular mass   6. PVC's (premature ventricular contractions)   7. Scrotal swelling   8. Need for immunization against influenza   9. Hyperbilirubinemia      Plan   Hypertension f/u: BP control is fairly well controlled. Continue carvedilol bid. Monitor at home Elevated liver tests f/u: start eval with lab work. Educated on recommendations regarding alcohol use. Need to limit use.  Scrotal ultarsound: ? Scrotal edema vs mass/hernia.  PVCs: present on exam. Asymptomatic. Continue bb cAD: stable.  Insulin resistance on metformin: check a1c.  Flu vaccine today.   I spent a total of 48 minutes for this patient encounter. Time spent included preparation, face-to-face counseling with the patient and coordination of care, review of chart and records, and documentation of the encounter.  Education regarding  management of these chronic disease states was given. Management strategies discussed on successive visits include dietary and exercise recommendations, goals of achieving and maintaining IBW, and lifestyle modifications aiming for adequate sleep and minimizing stressors.   Follow up: Return in about 6 months (around 10/08/2021) for complete physical.  Orders Placed This Encounter  Procedures   US SCROTUM W/DOPPLER   Varicella-zoster vaccine IM   Flu Vaccine QUAD 35mo+IM (Fluarix, Fluzone & Alfiuria Quad PF)   Comprehensive metabolic panel   Hepatitis B surface antigen   Hepatitis B core antibody, total   Hemoglobin A1c   T3   T4, free   TSH   Gamma GT    No orders of the defined types were placed in this encounter.     BP Readings from Last 3 Encounters:  04/10/21 (!) 147/91  11/25/20 132/72  10/09/20 118/82   Wt Readings from Last 3 Encounters:  04/10/21 162 lb (73.5 kg)  11/25/20 165 lb (74.8 kg)  10/09/20 170 lb 12.8 oz (77.5 kg)    Lab Results  Component Value Date   CHOL 103 10/09/2020   CHOL 109 10/19/2019   CHOL 146 09/05/2018   Lab Results  Component Value Date   HDL 44.50 10/09/2020   HDL 44 10/19/2019   HDL 55.70 09/05/2018   Lab Results  Component Value Date   LDLCALC 48 10/09/2020   LDLCALC 50 10/19/2019   LDLCALC 61 09/05/2018   Lab Results  Component Value Date   TRIG 56.0 10/09/2020   TRIG 75 10/19/2019   TRIG 146.0 09/05/2018   Lab Results  Component Value  Date   CHOLHDL 2 10/09/2020   CHOLHDL 2.5 10/19/2019   CHOLHDL 3 09/05/2018   No results found for: LDLDIRECT Lab Results  Component Value Date   CREATININE 0.96 04/10/2021   BUN 15 04/10/2021   NA 140 04/10/2021   K 4.1 04/10/2021   CL 102 04/10/2021   CO2 26 04/10/2021    The ASCVD Risk score (Arnett DK, et al., 2019) failed to calculate for the following reasons:   The valid total cholesterol range is 130 to 320 mg/dL  I reviewed the patients updated PMH, FH, and SocHx.     Patient Active Problem List   Diagnosis Date Noted   CAD (coronary artery disease) 12/03/2019    Priority: 1.   Essential hypertension 07/24/2019    Priority: 1.   OSA on CPAP 09/07/2018    Priority: 1.   GAD (generalized anxiety disorder) 05/25/2018    Priority: 1.   Major depression, recurrent, chronic ([place]) 05/25/2018    Priority: 1.   S/P CABG (coronary artery bypass graft) 11/16/2017    Priority: 1.   Mixed hyperlipidemia 05/24/2014    Priority: 1.   Alcohol use disorder, mild, abuse 03/25/2019    Priority: 2.   Insulin resistance 11/16/2017    Priority: 2.   Subclinical hypothyroidism 11/16/2017    Priority: 2.   Macular degeneration     Priority: 2.   History of Hodgkin's lymphoma 07/13/1995    Priority: 2.   PVC's (premature ventricular contractions) 09/11/2014    Priority: 3.    Allergies: Patient has no known allergies.  Social History: Patient  reports that he has never smoked. He has quit using smokeless tobacco. He reports current alcohol use of about 3.0 standard drinks per week. He reports that he does not use drugs.  Current Meds  Medication Sig   ARIPiprazole (ABILIFY) 5 MG tablet Take 1 tablet (5 mg total) by mouth daily.   aspirin 81 MG tablet Take 81 mg by mouth daily.   BESIVANCE 0.6 % SUSP as needed.    carvedilol (COREG) 6.25 MG tablet Take 1 tablet by mouth twice daily. **Patient needs office visit for future refills.**   escitalopram (LEXAPRO) 20 MG tablet Take 1 tablet (20 mg total) by mouth daily.   ibuprofen (ADVIL,MOTRIN) 200 MG tablet Take 200 mg by mouth every 6 (six) hours as needed.   LORazepam (ATIVAN) 0.5 MG tablet Take 1 tablet (0.5 mg total) by mouth 2 (two) times daily.   metFORMIN (GLUCOPHAGE-XR) 750 MG 24 hr tablet Take 1 tablet by mouth daily with breakfast.   nitroGLYCERIN (NITROSTAT) 0.4 MG SL tablet Place 1 tablet (0.4 mg total) under the tongue every 5 (five) minutes as needed for chest pain.   NONFORMULARY OR COMPOUNDED  ITEM Shots for macular degeneration left eye   rosuvastatin (CRESTOR) 20 MG tablet Take 1 tablet by mouth daily. **Please call to schedule office visit prior to further refills**    Review of Systems: Cardiovascular: negative for chest pain, palpitations, leg swelling, orthopnea Respiratory: negative for SOB, wheezing or persistent cough Gastrointestinal: negative for abdominal pain Genitourinary: negative for dysuria or gross hematuria  Objective  Vitals: BP (!) 147/91   Pulse 60   Temp 97.6 F (36.4 C) (Temporal)   Ht 5' 7.5" (1.715 m)   Wt 162 lb (73.5 kg)   SpO2 98%   BMI 25.00 kg/m  General: no acute distress  Psych:  Alert and oriented, normal mood and affect HEENT:  Normocephalic, atraumatic,  supple neck  Cardiovascular:  RRR without murmur. no edema, + atopic beats present Respiratory:  Good breath sounds bilaterally, CTAB with normal respiratory effort Gastrointestinal: soft, flat abdomen, normal active bowel sounds, no palpable masses, no hepatosplenomegaly, no appreciated hernias No ascites appreciated GU: scrotum:edematous, enlarged, ? hernia [place] side effects, risks, benefits, and alternatives for medications and treatment plan prescribed today were discussed, and the patient expressed understanding of the given instructions. Patient is instructed to call or message via MyChart if he/she has any questions or concerns regarding our treatment plan. No barriers to understanding were identified. We discussed Red Flag symptoms and signs in detail. Patient expressed understanding regarding what to do in case of urgent or emergency type symptoms.  Medication list was reconciled, printed and provided to the patient in AVS. Patient instructions and summary information was reviewed with the patient as documented in the AVS. This note was prepared with assistance of Dragon voice recognition software. Occasional wrong-word or sound-a-like substitutions may have occurred due to the  inherent limitations of voice recognition software  This visit occurred during the SARS-CoV-2 public health emergency.  Safety protocols were in place, including screening questions prior to the visit, additional usage of staff PPE, and extensive cleaning of exam room while observing appropriate contact time as indicated for disinfecting solutions.

## 2021-04-13 ENCOUNTER — Encounter: Payer: Self-pay | Admitting: Family Medicine

## 2021-04-13 ENCOUNTER — Telehealth: Payer: Self-pay

## 2021-04-13 LAB — HEPATITIS B SURFACE ANTIGEN: Hepatitis B Surface Ag: NONREACTIVE

## 2021-04-13 LAB — HEPATITIS B CORE ANTIBODY, TOTAL: Hep B Core Total Ab: NONREACTIVE

## 2021-04-13 LAB — T3: T3, Total: 95 ng/dL (ref 76–181)

## 2021-04-13 NOTE — Telephone Encounter (Signed)
Left voice mail

## 2021-04-13 NOTE — Telephone Encounter (Signed)
Spoke with Elmyra Ricks, gave order clarification from [facility]

## 2021-04-13 NOTE — Telephone Encounter (Signed)
Shane [name] from [facility] called needing clarification on orders. She would like a call back at [phone] [id number]. Please Advise.

## 2021-04-14 ENCOUNTER — Other Ambulatory Visit: Payer: Self-pay

## 2021-04-14 ENCOUNTER — Encounter: Payer: Self-pay | Admitting: Family Medicine

## 2021-04-21 ENCOUNTER — Ambulatory Visit
Admission: RE | Admit: 2021-04-21 | Discharge: 2021-04-21 | Disposition: A | Discharge status: Home / Self Care | Payer: BC Managed Care – PPO | Source: Ambulatory Visit | Attending: Family Medicine | Admitting: Family Medicine

## 2021-04-21 DIAGNOSIS — F411 Generalized anxiety disorder: Secondary | ICD-10-CM | POA: Diagnosis not present

## 2021-04-21 DIAGNOSIS — F902 Attention-deficit hyperactivity disorder, combined type: Secondary | ICD-10-CM | POA: Diagnosis not present

## 2021-04-21 DIAGNOSIS — F4010 Social phobia, unspecified: Secondary | ICD-10-CM | POA: Diagnosis not present

## 2021-04-21 DIAGNOSIS — F3110 Bipolar disorder, current episode manic without psychotic features, unspecified: Secondary | ICD-10-CM | POA: Diagnosis not present

## 2021-04-21 DIAGNOSIS — N433 Hydrocele, unspecified: Secondary | ICD-10-CM | POA: Diagnosis not present

## 2021-04-21 DIAGNOSIS — N5089 Other specified disorders of the male genital organs: Secondary | ICD-10-CM

## 2021-04-22 ENCOUNTER — Ambulatory Visit
Admission: RE | Admit: 2021-04-22 | Discharge: 2021-04-22 | Disposition: A | Discharge status: Home / Self Care | Payer: BC Managed Care – PPO | Source: Ambulatory Visit | Attending: Family Medicine | Admitting: Family Medicine

## 2021-04-22 DIAGNOSIS — K7689 Other specified diseases of liver: Secondary | ICD-10-CM | POA: Diagnosis not present

## 2021-04-22 DIAGNOSIS — K828 Other specified diseases of gallbladder: Secondary | ICD-10-CM | POA: Diagnosis not present

## 2021-04-26 ENCOUNTER — Encounter: Payer: Self-pay | Admitting: Family Medicine

## 2021-04-28 ENCOUNTER — Encounter (INDEPENDENT_AMBULATORY_CARE_PROVIDER_SITE_OTHER): Payer: BC Managed Care – PPO | Admitting: Ophthalmology

## 2021-04-28 ENCOUNTER — Other Ambulatory Visit: Payer: Self-pay

## 2021-04-28 DIAGNOSIS — I10 Essential (primary) hypertension: Secondary | ICD-10-CM | POA: Diagnosis not present

## 2021-04-28 DIAGNOSIS — H35033 Hypertensive retinopathy, bilateral: Secondary | ICD-10-CM | POA: Diagnosis not present

## 2021-04-28 DIAGNOSIS — H353221 Exudative age-related macular degeneration, left eye, with active choroidal neovascularization: Secondary | ICD-10-CM

## 2021-04-28 DIAGNOSIS — H353111 Nonexudative age-related macular degeneration, right eye, early dry stage: Secondary | ICD-10-CM

## 2021-04-28 DIAGNOSIS — R7989 Other specified abnormal findings of blood chemistry: Secondary | ICD-10-CM

## 2021-04-28 DIAGNOSIS — K7460 Unspecified cirrhosis of liver: Secondary | ICD-10-CM

## 2021-04-28 DIAGNOSIS — H43813 Vitreous degeneration, bilateral: Secondary | ICD-10-CM

## 2021-04-30 ENCOUNTER — Other Ambulatory Visit: Payer: Self-pay | Admitting: Nurse Practitioner

## 2021-04-30 ENCOUNTER — Telehealth: Payer: Self-pay | Admitting: Cardiovascular Disease

## 2021-04-30 MED ORDER — NITROGLYCERIN 0.4 MG SL SUBL
0.4000 mg | SUBLINGUAL_TABLET | SUBLINGUAL | 6 refills | Status: AC | PRN
Start: 2021-04-30 — End: ?

## 2021-04-30 NOTE — Telephone Encounter (Signed)
Refill completed per request

## 2021-04-30 NOTE — Telephone Encounter (Signed)
*  STAT* If patient is at the pharmacy, call can be transferred to refill team.   1. Which medications need to be refilled? (please list name of each medication and dose if known)  nitroGLYCERIN (NITROSTAT) 0.4 MG SL tablet  2. Which pharmacy/location (including street and city if local pharmacy) is medication to be sent to? [place],  - [address] AT [place]  3. Do they need a 30 day or 90 day supply? 30 day supply

## 2021-04-30 NOTE — Progress Notes (Signed)
Refill of Ntg sent to patient's pharmacy per request

## 2021-05-03 ENCOUNTER — Other Ambulatory Visit: Payer: Self-pay | Admitting: Family Medicine

## 2021-05-03 ENCOUNTER — Other Ambulatory Visit: Payer: Self-pay | Admitting: Adult Health

## 2021-05-03 DIAGNOSIS — E8881 Metabolic syndrome: Secondary | ICD-10-CM

## 2021-05-03 DIAGNOSIS — I25700 Atherosclerosis of coronary artery bypass graft(s), unspecified, with unstable angina pectoris: Secondary | ICD-10-CM

## 2021-05-03 DIAGNOSIS — G4730 Sleep apnea, unspecified: Secondary | ICD-10-CM

## 2021-05-03 DIAGNOSIS — F410 Panic disorder [episodic paroxysmal anxiety] without agoraphobia: Secondary | ICD-10-CM

## 2021-05-03 DIAGNOSIS — R0683 Snoring: Secondary | ICD-10-CM

## 2021-05-03 DIAGNOSIS — Z951 Presence of aortocoronary bypass graft: Secondary | ICD-10-CM

## 2021-05-03 DIAGNOSIS — F324 Major depressive disorder, single episode, in partial remission: Secondary | ICD-10-CM

## 2021-05-03 DIAGNOSIS — E038 Other specified hypothyroidism: Secondary | ICD-10-CM

## 2021-05-05 ENCOUNTER — Other Ambulatory Visit: Payer: Self-pay | Admitting: Adult Health

## 2021-05-05 DIAGNOSIS — F410 Panic disorder [episodic paroxysmal anxiety] without agoraphobia: Secondary | ICD-10-CM

## 2021-05-05 MED ORDER — LORAZEPAM 0.5 MG PO TABS: 0.5000 mg | ORAL_TABLET | Freq: Two times a day (BID) | ORAL | 1 refills | Status: DC

## 2021-05-07 DIAGNOSIS — R7989 Other specified abnormal findings of blood chemistry: Secondary | ICD-10-CM | POA: Diagnosis not present

## 2021-05-19 DIAGNOSIS — R945 Abnormal results of liver function studies: Secondary | ICD-10-CM | POA: Diagnosis not present

## 2021-05-19 DIAGNOSIS — K7460 Unspecified cirrhosis of liver: Secondary | ICD-10-CM | POA: Diagnosis not present

## 2021-06-03 ENCOUNTER — Other Ambulatory Visit: Payer: Self-pay

## 2021-06-03 MED ORDER — CARVEDILOL 6.25 MG PO TABS
ORAL_TABLET | ORAL | 3 refills | Status: DC
Start: 2021-06-03 — End: 2021-08-18

## 2021-06-09 ENCOUNTER — Encounter: Payer: Self-pay | Admitting: Cardiovascular Disease

## 2021-06-09 ENCOUNTER — Other Ambulatory Visit: Payer: Self-pay

## 2021-06-09 ENCOUNTER — Ambulatory Visit: Payer: BC Managed Care – PPO | Admitting: Cardiovascular Disease

## 2021-06-09 DIAGNOSIS — I493 Ventricular premature depolarization: Secondary | ICD-10-CM | POA: Diagnosis not present

## 2021-06-09 DIAGNOSIS — I10 Essential (primary) hypertension: Secondary | ICD-10-CM

## 2021-06-09 DIAGNOSIS — Z9989 Dependence on other enabling machines and devices: Secondary | ICD-10-CM

## 2021-06-09 DIAGNOSIS — E782 Mixed hyperlipidemia: Secondary | ICD-10-CM | POA: Diagnosis not present

## 2021-06-09 DIAGNOSIS — G4733 Obstructive sleep apnea (adult) (pediatric): Secondary | ICD-10-CM

## 2021-06-09 DIAGNOSIS — Z951 Presence of aortocoronary bypass graft: Secondary | ICD-10-CM | POA: Diagnosis not present

## 2021-06-09 MED ORDER — AMLODIPINE BESYLATE 5 MG PO TABS: 5.0000 mg | ORAL_TABLET | Freq: Every day | ORAL | 3 refills | Status: DC

## 2021-06-09 NOTE — Assessment & Plan Note (Signed)
History of CAD status post anterior STEMI 08/28/2009 treated with a Xience drug-eluting stent to his proximal LAD.  He was recatheterized August 2015 which led to CABG 02/27/2014 with a LIMA to his LAD, vein to ramus branch, obtuse marginal branch and the RCA.  His EF improved to 35% up to 50 to 55% by 2D echo in 2017.  He denies chest pain.

## 2021-06-09 NOTE — Assessment & Plan Note (Signed)
History of obstructive sleep apnea although he just does not tolerate CPAP.  I am referring him to Dr. Claiborne Billings for alternative therapies.

## 2021-06-09 NOTE — Patient Instructions (Addendum)
Medication Instructions:   -Start amlodipine (norvasc) 5mg  once daily.  *If you need a refill on your cardiac medications before your next appointment, please call your pharmacy*   Follow-Up: At [facility], you and your health needs are our priority.  As part of our continuing mission to provide you with exceptional heart care, we have created designated Provider Care Teams.  These Care Teams include your primary Cardiologist (physician) and Advanced Practice Providers (APPs -  Physician Assistants and Nurse Practitioners) who all work together to provide you with the care you need, when you need it.  We recommend signing up for the patient portal called "MyChart".  Sign up information is provided on this After Visit Summary.  MyChart is used to connect with patients for Virtual Visits (Telemedicine).  Patients are able to view lab/test results, encounter notes, upcoming appointments, etc.  Non-urgent messages can be sent to your provider as well.   To learn more about what you can do with MyChart, go to [email or web address].    Your next appointment:   3 month(s)  The format for your next appointment:   In Person  Provider:   Quay Burow, MD  Other Instructions -Pt needs appointment to see Dr. Claiborne Billings in the sleep clinic.  Dr. Gwenlyn Found has requested that you schedule an appointment with one of our clinical pharmacists for a blood pressure check appointment within the next 4 weeks.  If you monitor your blood pressure (BP) at home, please bring your BP cuff and your BP readings with you to this appointment  HOW TO TAKE YOUR BLOOD PRESSURE: Rest 5 minutes before taking your blood pressure. Don't smoke or drink caffeinated beverages for at least 30 minutes before. Take your blood pressure before (not after) you eat. Sit comfortably with your back supported and both feet on the floor (don't cross your legs). Elevate your arm to heart level on a table or a desk. Use the proper sized  cuff. It should fit smoothly and snugly around your bare upper arm. There should be enough room to slip a fingertip under the cuff. The bottom edge of the cuff should be 1 inch above the crease of the elbow. Ideally, take 3 measurements at one sitting and record the average.

## 2021-06-09 NOTE — Progress Notes (Signed)
06/09/2021 Shane [name]   10/03/1965  [id number]  Primary Physician Shane Arnt, MD Primary Cardiologist: Shane Harp MD Shane [name], [place]  HPI:  Shane [name] is a 55 y.o.  thin appearing married Caucasian male father of 2 children [name] is also a patient of mine.    I last saw him in the office    11/25/2020.  He works as an Sales promotion account executive. His primary care physician is Dr. Cecille [name]. He recently was married earlier this month and he and [name] honeymoon in [place]. His cardiac risk factor profile is remarkable for hyperlipidemia and family history the father who had bypass surgery at age 3 and a brother who had bypass surgery as well. He suffered an anterior wall myocardial infarction 08/28/09 with a Xience DES stent placed in his proximal LAD. Because of recurrent symptoms he underwent catheterization in August and ultimately coronary bypass grafting 02/27/14 with a LIMA to his LAD, a vein to ramus branch, obtuse marginal branch and the RCA. His ejection fraction was 35%. He recuperated nicely although he did not participate in cardiac rehabilitation. A 2-D echocardiogram performed 08/27/14 revealed an improvement in his ejection fraction up to 50-55% with anterolateral wall motion abnormality. While honeymooning in [place] he noticed prolonged recovery from exercise activity as well as "just not feeling right". He denies chest pain. I performed a Myoview stress test on him after that on 06/28/16 which was low risk and nonischemic and 2-D echo that revealed preserved LV function with EF of 50-55%. Those symptoms have since subsided.   Since I saw him in the office 6 months ago he has been diagnosed with primary biliary's cirrhosis and is undergoing evaluation at [facility].  He is scheduled for liver biopsy next week.  He has lost 12 pounds since I saw him.  He complains of nausea and has been unable to eat a normal diet.  He  is also had some dyspnea but denies chest pain.  His blood pressures have been trending up recently.   Current Meds  Medication Sig   ARIPiprazole (ABILIFY) 5 MG tablet Take 1 tablet (5 mg total) by mouth daily.   aspirin 81 MG tablet Take 81 mg by mouth daily.   BESIVANCE 0.6 % SUSP as needed.    carvedilol (COREG) 6.25 MG tablet Take 1 tablet by mouth twice daily.   escitalopram (LEXAPRO) 20 MG tablet Take 1 tablet (20 mg total) by mouth daily.   ibuprofen (ADVIL,MOTRIN) 200 MG tablet Take 200 mg by mouth every 6 (six) hours as needed.   LORazepam (ATIVAN) 0.5 MG tablet Take 1 tablet (0.5 mg total) by mouth 2 (two) times daily.   metFORMIN (GLUCOPHAGE-XR) 750 MG 24 hr tablet Take 1 tablet by mouth daily with breakfast.   nitroGLYCERIN (NITROSTAT) 0.4 MG SL tablet Place 1 tablet (0.4 mg total) under the tongue every 5 (five) minutes as needed for chest pain.   NONFORMULARY OR COMPOUNDED ITEM Shots for macular degeneration left eye   rosuvastatin (CRESTOR) 20 MG tablet Take 1 tablet by mouth daily. **Please call to schedule office visit prior to further refills**   ursodiol (ACTIGALL) 500 MG tablet Take 500 mg by mouth 2 (two) times daily.     No Known Allergies  Social History   Socioeconomic History   Marital status: Married    Spouse name: Not on file   Number of children: Not on file  Years of education: Not on file   Highest education level: Not on file  Occupational History   Not on file  Tobacco Use   Smoking status: Never   Smokeless tobacco: Former   Tobacco comments:    a long time ago  Vaping Use   Vaping Use: Never used  Substance and Sexual Activity   Alcohol use: Yes    Alcohol/week: 3.0 standard drinks    Types: 3 Cans of beer per week    Comment: sometimes 3 beers daily   Drug use: No   Sexual activity: Yes  Other Topics Concern   Not on file  Social History Narrative   Not on file   Social Determinants of Health   Financial Resource Strain: Not on  file  Food Insecurity: Not on file  Transportation Needs: Not on file  Physical Activity: Not on file  Stress: Not on file  Social Connections: Not on file  Intimate Partner Violence: Not on file     Review of Systems: General: negative for chills, fever, night sweats or weight changes.  Cardiovascular: negative for chest pain, dyspnea on exertion, edema, orthopnea, palpitations, paroxysmal nocturnal dyspnea or shortness of breath Dermatological: negative for rash Respiratory: negative for cough or wheezing Urologic: negative for hematuria Abdominal: negative for nausea, vomiting, diarrhea, bright red blood per rectum, melena, or hematemesis Neurologic: negative for visual changes, syncope, or dizziness All other systems reviewed and are otherwise negative except as noted above.    Blood pressure 134/90, pulse 78, height 5' 7.5" (1.715 m), weight 153 lb 12.8 oz (69.8 kg), SpO2 97 %.  General appearance: alert and no distress Neck: no adenopathy, no carotid bruit, no JVD, supple, symmetrical, trachea midline, and thyroid not enlarged, symmetric, no tenderness/mass/nodules Lungs: clear to auscultation bilaterally Heart: regular rate and rhythm, S1, S2 normal, no murmur, click, rub or gallop Extremities: extremities normal, atraumatic, no cyanosis or edema Pulses: 2+ and symmetric Skin: Skin color, texture, turgor normal. No rashes or lesions Neurologic: Grossly normal  EKG sinus rhythm at 78 with left anterior fascicular block, and nonspecific IVCD, poor R wave progression, with occasional PVCs.  I personally reviewed this EKG.  ASSESSMENT AND PLAN:   Mixed hyperlipidemia History of hyperlipidemia on statin therapy with lipid profile performed 10/09/2020 revealing total cholesterol of 103, LDL 48 and HDL 44.  PVC's (premature ventricular contractions) History of frequent PVCs on event monitor.  He is on carvedilol 6.25 mg p.o. twice daily.  He is asymptomatic from this.  S/P  CABG (coronary artery bypass graft) History of CAD status post anterior STEMI 08/28/2009 treated with a Xience drug-eluting stent to his proximal LAD.  He was recatheterized August 2015 which led to CABG 02/27/2014 with a LIMA to his LAD, vein to ramus branch, obtuse marginal branch and the RCA.  His EF improved to 35% up to 50 to 55% by 2D echo in 2017.  He denies chest pain.  OSA on CPAP History of obstructive sleep apnea although he just does not tolerate CPAP.  I am referring him to Dr. Claiborne Billings for alternative therapies.  Essential hypertension History of essential hypertension with blood pressure measured today at 134/90.  He is on relatively low-dose carvedilol.  His blood pressure log has shown progressively elevated blood pressures.  I am going to start him on amlodipine 5 mg a day and we will have him calibrate his blood pressure monitor, keep a 30-day blood pressure log and see a Pharm.D. in 4 weeks to  review make appropriate titrations.     Shane Harp MD FACP,FACC,FAHA, [facility] 06/09/2021 4:18 PM

## 2021-06-09 NOTE — Assessment & Plan Note (Signed)
History of frequent PVCs on event monitor.  He is on carvedilol 6.25 mg p.o. twice daily.  He is asymptomatic from this.

## 2021-06-09 NOTE — Assessment & Plan Note (Signed)
History of essential hypertension with blood pressure measured today at 134/90.  He is on relatively low-dose carvedilol.  His blood pressure log has shown progressively elevated blood pressures.  I am going to start him on amlodipine 5 mg a day and we will have him calibrate his blood pressure monitor, keep a 30-day blood pressure log and see a Pharm.D. in 4 weeks to review make appropriate titrations.

## 2021-06-09 NOTE — Assessment & Plan Note (Signed)
History of hyperlipidemia on statin therapy with lipid profile performed 10/09/2020 revealing total cholesterol of 103, LDL 48 and HDL 44.

## 2021-06-12 ENCOUNTER — Ambulatory Visit: Payer: BC Managed Care – PPO | Admitting: Cardiovascular Disease

## 2021-06-12 ENCOUNTER — Other Ambulatory Visit: Payer: Self-pay

## 2021-06-12 ENCOUNTER — Encounter: Payer: Self-pay | Admitting: Cardiovascular Disease

## 2021-06-12 DIAGNOSIS — Z8571 Personal history of Hodgkin lymphoma: Secondary | ICD-10-CM

## 2021-06-12 DIAGNOSIS — I252 Old myocardial infarction: Secondary | ICD-10-CM

## 2021-06-12 DIAGNOSIS — I2510 Atherosclerotic heart disease of native coronary artery without angina pectoris: Secondary | ICD-10-CM

## 2021-06-12 DIAGNOSIS — G4733 Obstructive sleep apnea (adult) (pediatric): Secondary | ICD-10-CM | POA: Diagnosis not present

## 2021-06-12 DIAGNOSIS — I493 Ventricular premature depolarization: Secondary | ICD-10-CM

## 2021-06-12 DIAGNOSIS — E785 Hyperlipidemia, unspecified: Secondary | ICD-10-CM

## 2021-06-12 DIAGNOSIS — G4719 Other hypersomnia: Secondary | ICD-10-CM | POA: Diagnosis not present

## 2021-06-12 DIAGNOSIS — Z8659 Personal history of other mental and behavioral disorders: Secondary | ICD-10-CM

## 2021-06-12 DIAGNOSIS — K743 Primary biliary cirrhosis: Secondary | ICD-10-CM

## 2021-06-12 DIAGNOSIS — Z951 Presence of aortocoronary bypass graft: Secondary | ICD-10-CM

## 2021-06-12 NOTE — Patient Instructions (Signed)
Medication Instructions:  Continue all medications *If you need a refill on your cardiac medications before your next appointment, please call your pharmacy*   Lab Work: None ordered   Testing/Procedures: None ordered   Follow-Up: At [facility], you and your health needs are our priority.  As part of our continuing mission to provide you with exceptional heart care, we have created designated Provider Care Teams.  These Care Teams include your primary Cardiologist (physician) and Advanced Practice Providers (APPs -  Physician Assistants and Nurse Practitioners) who all work together to provide you with the care you need, when you need it.  We recommend signing up for the patient portal called "MyChart".  Sign up information is provided on this After Visit Summary.  MyChart is used to connect with patients for Virtual Visits (Telemedicine).  Patients are able to view lab/test results, encounter notes, upcoming appointments, etc.  Non-urgent messages can be sent to your provider as well.   To learn more about what you can do with MyChart, go to [email or web address].      Your next appointment:  4 to 6 months    The format for your next appointment: Office    Provider:  [facility]

## 2021-06-12 NOTE — Progress Notes (Signed)
Cardiology Office Note    Date:  06/14/2021   ID:  Shane [name], DOB Mar 11, 1966, MRN [id number]  PCP:  Leamon Arnt, MD  Cardiologist:  Shelva Majestic, MD (sleep); Dr. Gwenlyn Found  New sleep evaluation with me   History of Present Illness:  Shane [name] is a 55 y.o. male who is followed by Dr. Gwenlyn Found for cardiology care.  He has a strong family history of CAD and has a history of prior Hodgkin's disease in 1997, macular degeneration, and hyperlipidemia.  He suffered an anterior wall myocardial infarction on August 28, 2009.  At that time, a Xience DES stent was placed in his proximal LAD.  Due to subsequent recurrent symptomatology in August 2015 he underwent CABG revascularization at [facility] with a LIMA to his LAD, and had SVGs placed to his ramus, OM, and RCA.  A nuclear stress test in December 2017 was low risk and nonischemic.  2D echo revealed preserved LV function with EF at 50 to 55%.  Earlier this year he was diagnosed with primary biliary cirrhosis and is undergoing evaluation at [facility].  He will need a future liver biopsy.   In 2020, he had a sleep study done at [facility] neurology and saw Dr. Brett Fairy.  At that time, he had a history of loud snoring, and awakening gasping for breath.  A sleep study apparently demonstrated moderately severe sleep apnea with a P RDI of 34.3 being most pronounced in non-REM sleep at 37.2 compared to REM sleep at 18.8/h.  PHI was 20.8/h.  He was started on AutoPap therapy and saw Vaughan Browner, NP on August 20 for follow-up evaluation.  Apparently, he used CPAP intermittently and ultimately completely stopped using therapy in April 2022.  He did not tolerate the fullface mask which resulted in some pressure on the bridge of his nose.  He saw Dr. Gwenlyn Found on June 09, 2021.  During Dr. Kennon Holter evaluation he suggested he see me for reassessment of his sleep apnea and discussions of possible alternatives.  Presently, he goes to bed  between 11 and 11:30 at night and wakes up around 630.  He admits to snoring.  The has daytime sleepiness.  I calculated an Epworth Sleepiness Scale score in the office today and this was elevated and endorsed at 11 consistent with excessive daytime sleepiness.  He presents for his initial evaluation with me.   Past Medical History:  Diagnosis Date   CAD (coronary artery disease), s/p stent 2011 and CABG 2015 [facility])    Hodgkin disease [facility]) 1997   Hyperlipidemia    Macular degeneration    PVC's (premature ventricular contractions)     Past Surgical History:  Procedure Laterality Date   CARDIAC CATHETERIZATION  08/2009   PCI -LAD 2.79mx18mm Xience   CORONARY ARTERY BYPASS GRAFT  02/2014    Current Medications: Outpatient Medications Prior to Visit  Medication Sig Dispense Refill   amLODipine (NORVASC) 5 MG tablet Take 1 tablet (5 mg total) by mouth daily. 90 tablet 3   ARIPiprazole (ABILIFY) 5 MG tablet Take 1 tablet (5 mg total) by mouth daily. 90 tablet 1   aspirin 81 MG tablet Take 81 mg by mouth daily.     BESIVANCE 0.6 % SUSP as needed.   1   carvedilol (COREG) 6.25 MG tablet Take 1 tablet by mouth twice daily. 180 tablet 3   escitalopram (LEXAPRO) 20 MG tablet Take 1 tablet (20 mg total) by mouth daily. [place]  tablet 1   ibuprofen (ADVIL,MOTRIN) 200 MG tablet Take 200 mg by mouth every 6 (six) hours as needed.     LORazepam (ATIVAN) 0.5 MG tablet Take 1 tablet (0.5 mg total) by mouth 2 (two) times daily. 60 tablet 1   metFORMIN (GLUCOPHAGE-XR) 750 MG 24 hr tablet Take 1 tablet by mouth daily with breakfast. 30 tablet 0   nitroGLYCERIN (NITROSTAT) 0.4 MG SL tablet Place 1 tablet (0.4 mg total) under the tongue every 5 (five) minutes as needed for chest pain. 25 tablet 6   NONFORMULARY OR COMPOUNDED ITEM Shots for macular degeneration left eye     rosuvastatin (CRESTOR) 20 MG tablet Take 1 tablet by mouth daily. **Please call to schedule office visit prior to further  refills** 90 tablet 3   ursodiol (ACTIGALL) 500 MG tablet Take 500 mg by mouth 2 (two) times daily.     No facility-administered medications prior to visit.     Allergies:   Patient has no known allergies.   Social History   Socioeconomic History   Marital status: Married    Spouse name: Not on file   Number of children: Not on file   Years of education: Not on file   Highest education level: Not on file  Occupational History   Not on file  Tobacco Use   Smoking status: Never   Smokeless tobacco: Former   Tobacco comments:    a long time ago  Vaping Use   Vaping Use: Never used  Substance and Sexual Activity   Alcohol use: Yes    Alcohol/week: 3.0 standard drinks    Types: 3 Cans of beer per week    Comment: sometimes 3 beers daily   Drug use: No   Sexual activity: Yes  Other Topics Concern   Not on file  Social History Narrative   Not on file   Social Determinants of Health   Financial Resource Strain: Not on file  Food Insecurity: Not on file  Transportation Needs: Not on file  Physical Activity: Not on file  Stress: Not on file  Social Connections: Not on file    Socially, he was born in [place] and lived there for 25 years.  He is in his second marriage and has been married for 5 years.  He has 2 children ages 44 and 55 from his first marriage.  He works at [employer].   Family History:  The patient's family history includes COPD in his father and mother; Cancer in his maternal grandmother and mother; Heart Problems in his brother and brother; Heart attack in his father; Heart disease in his father; Stroke in his mother.   ROS General: Negative; No fevers, chills, or night sweats;  HEENT: Negative; No changes in vision or hearing, sinus congestion, difficulty swallowing Pulmonary: Negative; No cough, wheezing, shortness of breath, hemoptysis Cardiovascular: History of anterior MI 2011;, status post CABG revascularization 2015 GI: Primary biliary cholangitis GU:  Negative; No dysuria, hematuria, or difficulty voiding Musculoskeletal: Negative; no myalgias, joint pain, or weakness Hematologic/Oncology: History of Hodgkin's disease Endocrine: Positive for prediabetes Neuro: Negative; no changes in balance, headaches Skin: Negative; No rashes or skin lesions Psychiatric: Positive for depression on Abilify and Lexapro Sleep: History of OSA as noted above, currently not on therapy.  History of snoring, daytime sleepiness, hypersomnolence; no bruxism, restless legs, hypnogognic hallucinations, no cataplexy Other comprehensive 14 point system review is negative.   PHYSICAL EXAM:   VS:  BP 105/68   Pulse 74  Ht 5' 7.05" (1.703 m)   Wt 155 lb 9.6 oz (70.6 kg)   SpO2 99%   BMI 24.33 kg/m     Repeat blood pressure by me was 100/60  Wt Readings from Last 3 Encounters:  06/12/21 155 lb 9.6 oz (70.6 kg)  06/09/21 153 lb 12.8 oz (69.8 kg)  04/10/21 162 lb (73.5 kg)    General: Alert, oriented, no distress.  Skin: normal turgor, no rashes, warm and dry HEENT: Normocephalic, atraumatic. Pupils equal round and reactive to light; sclera anicteric; extraocular muscles intact;  Nose without nasal septal hypertrophy Mouth/Parynx benign; Mallinpatti scale 2/3 Neck: No JVD, no carotid bruits; normal carotid upstroke Lungs: clear to ausculatation and percussion; no wheezing or rales Chest wall: without tenderness to palpitation Heart: PMI not displaced, regular rhythm with occasional ectopy, s1 s2 normal, 1/6 systolic murmur, no diastolic murmur, no rubs, gallops, thrills, or heaves Abdomen: Mild tenderness in the right upper quadrant to palpation with palpable liver;  BS+; abdominal aorta nontender and not dilated by palpation. Back: no CVA tenderness Pulses 2+ Musculoskeletal: full range of motion, normal strength, no joint deformities Extremities: no clubbing cyanosis or edema, Homan's sign negative  Neurologic: grossly nonfocal; Cranial nerves grossly  wnl Psychologic: Normal mood and affect   Studies/Labs Reviewed:   EKG:  EKG is ordered today.  Sinus rhythm at 74 bpm with occasional PVCs and a quadrigeminal rhythm.  A second ECG was also obtained with showed normal sinus rhythm at 74 bpm without ectopy.  Recent Labs: BMP Latest Ref Rng & Units 04/10/2021 10/09/2020 12/03/2019  Glucose 70 - 99 mg/dL 102(H) 126(H) 97  BUN 6 - 23 mg/dL _0 Creatinine 0.40 - 1.50 mg/dL 0.96 1.12 0.90  Sodium 135 - 145 mEq/L 140 140 140  Potassium 3.5 - 5.1 mEq/L 4.1 4.7 4.7  Chloride 96 - 112 mEq/L 102 106 101  CO2 19 - 32 mEq/L _1 Calcium 8.4 - 10.5 mg/dL 9.6 9.2 10.0     Hepatic Function Latest Ref Rng & Units 04/10/2021 10/09/2020 12/03/2019  Total Protein 6.0 - 8.3 g/dL 7.1 6.3 6.6  Albumin 3.5 - 5.2 g/dL 4.3 4.3 4.7  AST 0 - 37 U/L 35 68(H) 28  ALT 0 - 53 U/L 48 79(H) 69(H)  Alk Phosphatase 39 - 117 U/L 246(H) 73 120(H)  Total Bilirubin 0.2 - 1.2 mg/dL 2.1(H) 1.0 1.2  Bilirubin, Direct 0.00 - 0.40 mg/dL - - -    CBC Latest Ref Rng & Units 10/09/2020 12/03/2019 09/05/2018  WBC 4.0 - 10.5 K/uL 5.2 7.1 6.4  Hemoglobin 13.0 - 17.0 g/dL 14.0 13.7 14.3  Hematocrit 39.0 - 52.0 % 40.9 40.3 41.4  Platelets 150.0 - 400.0 K/uL 225.0 224.0 252.0   Lab Results  Component Value Date   MCV 93.9 10/09/2020   MCV 94.9 12/03/2019   MCV 94.8 09/05/2018   Lab Results  Component Value Date   TSH 8.54 (H) 04/10/2021   Lab Results  Component Value Date   HGBA1C 5.9 04/10/2021     BNP No results found for: BNP  ProBNP No results found for: PROBNP   Lipid Panel     Component Value Date/Time   CHOL 103 10/09/2020 0817   CHOL 109 10/19/2019 1146   TRIG 56.0 10/09/2020 0817   HDL 44.50 10/09/2020 0817   HDL 44 10/19/2019 1146   CHOLHDL 2 10/09/2020 0817   VLDL 11.2 10/09/2020 0817   LDLCALC 48 10/09/2020 0817  LDLCALC 50 10/19/2019 1146   LABVLDL 15 10/19/2019 1146     RADIOLOGY: No results found.   Additional studies/  records that were reviewed today include:  I reviewed the records from [facility] neurology including his home sleep study. Records of Dr. Gwenlyn Found were reviewed  An Epworth Sleepiness Scale score was In the office today which endorsed at 11.  ASSESSMENT:    1. OSA (obstructive sleep apnea); diagnosed in 2020, currently untreated   2. Excessive daytime sleepiness   3. PVC's (premature ventricular contractions)   4. History of MI (myocardial infarction): 08/28/2009   5. S/P CABG (coronary artery bypass graft): 2015   6. Coronary artery disease involving native coronary artery of native heart without angina pectoris   7. Dyslipidemia, goal LDL below 70   8. Primary biliary cholangitis ([place])   9. History of depression   10. History of Hodgkin's disease     PLAN:  Mr. Ramell Wacha is a 55 year old gentleman who has a remote history of Hodgkin's disease, and suffered an anterior wall myocardial infarction in 2011 and underwent successful DES stenting to his LAD.  He subsequently required CABG revascularization surgery in 2015 with a LIMA graft to his LAD, and vein graft to his ramus intermedius, obtuse marginal, and RCA.  EF at that time was 35% which subsequently improved to 50 to 55% on follow-up echocardiography in 2017.  In 2020 due to complaints of snoring, awakening gasping for breath, he underwent a sleep evaluation with Dr. Brett Fairy at [facility] neurology.  He was found to have moderately severe obstructive sleep apnea and AutoPap therapy at 6 to 16 cm was initiated.  Apparently, he did not consistently used treatment and ultimately abandon therapy completely earlier this year.  He has had issues with palpitations and has been demonstrated to have PVCs.  I reviewed the data from his 2020 sleep evaluation.  He was felt to have moderately severe obstructive sleep apnea by Dr. Brett Fairy with non-REM sleep accentuation.  He had brief episodes of oxygen desaturation without sustained hypoxia.  I had a  lengthy discussion with him today in the office.  I discussed normal sleep architecture and discussed potential disruption of normal sleep architecture as result of untreated sleep apnea.  From a cardiac perspective I discussed implications regarding blood pressure control, nocturnal arrhythmias, increased incidence of atrial fibrillation, as well as potential nocturnal hypoxemia contributing to ischemia both cardiac as well as cerebrovascular Lee.  I discussed its effects on blood sugar, inflammation, as well as GERD.  I also discussed with him new mask technology.  He has been using a fullface mask which did cause irritation to the bridge of his nose.  I discussed potential alternatives to CPAP therapy including a customized oral appliance versus inspire technology.  With his significant sleep apnea, I would not recommend definitive treatment with customized oral appliance since this predominantly treats mild to mild to moderate OSA.  After understanding much more about sleep apnea and its effects on his cardiovascular health, he would like to have another trial of his CPAP.  He still has his CPAP machine which is a air sense 10 AutoSet unit.  It has been set at a pressure range of 6 to 16 cm.  I provided him with a new sample mask which I believe will benefit his compliance and have given him a ResMed AirFit F 30i mask.  I discussed optimal sleep duration for an adult to be 7 to 9 hours.  I discussed that  upon the rinse of rem sleep occurs in the second half of the night and the importance of continuing to use treatment for the nights duration.  I answered all his questions regarding sleep apnea.  His blood pressure today is on the low side and he continues to be on amlodipine 5 mg and carvedilol 6.25 mg twice a day.Marland Kitchen  He continues to be on rosuvastatin 20 mg for hyperlipidemia with target LDL less than 70.  He is felt to have prediabetes and has been on metformin 750 mg daily.  He has issues with depression and  is on Abilify in addition to Lexapro.  I will see him in 4 to 6 months for reevaluation or sooner as needed.  Time spent: 40 minutes Medication Adjustments/Labs and Tests Ordered: Current medicines are reviewed at length with the patient today.  Concerns regarding medicines are outlined above.  Medication changes, Labs and Tests ordered today are listed in the Patient Instructions below. Patient Instructions  Medication Instructions:  Continue all medications *If you need a refill on your cardiac medications before your next appointment, please call your pharmacy*   Lab Work: None ordered   Testing/Procedures: None ordered   Follow-Up: At [facility], you and your health needs are our priority.  As part of our continuing mission to provide you with exceptional heart care, we have created designated Provider Care Teams.  These Care Teams include your primary Cardiologist (physician) and Advanced Practice Providers (APPs -  Physician Assistants and Nurse Practitioners) who all work together to provide you with the care you need, when you need it.  We recommend signing up for the patient portal called "MyChart".  Sign up information is provided on this After Visit Summary.  MyChart is used to connect with patients for Virtual Visits (Telemedicine).  Patients are able to view lab/test results, encounter notes, upcoming appointments, etc.  Non-urgent messages can be sent to your provider as well.   To learn more about what you can do with MyChart, go to [email or web address].      Your next appointment:  4 to 6 months    The format for your next appointment: Office    Provider:  Dr.Ivan Lacher    Signed, Shelva Majestic, MD  06/14/2021 4:14 PM    [facility] [address] [place], [place], [place]  [address] Phone: [phone]

## 2021-06-14 ENCOUNTER — Encounter: Payer: Self-pay | Admitting: Cardiovascular Disease

## 2021-06-16 ENCOUNTER — Other Ambulatory Visit: Payer: Self-pay

## 2021-06-16 ENCOUNTER — Telehealth: Payer: Self-pay

## 2021-06-16 ENCOUNTER — Encounter (INDEPENDENT_AMBULATORY_CARE_PROVIDER_SITE_OTHER): Payer: BC Managed Care – PPO | Admitting: Ophthalmology

## 2021-06-16 DIAGNOSIS — I10 Essential (primary) hypertension: Secondary | ICD-10-CM

## 2021-06-16 DIAGNOSIS — H35033 Hypertensive retinopathy, bilateral: Secondary | ICD-10-CM | POA: Diagnosis not present

## 2021-06-16 DIAGNOSIS — B399 Histoplasmosis, unspecified: Secondary | ICD-10-CM

## 2021-06-16 DIAGNOSIS — H353221 Exudative age-related macular degeneration, left eye, with active choroidal neovascularization: Secondary | ICD-10-CM

## 2021-06-16 DIAGNOSIS — H353111 Nonexudative age-related macular degeneration, right eye, early dry stage: Secondary | ICD-10-CM

## 2021-06-16 DIAGNOSIS — H348322 Tributary (branch) retinal vein occlusion, left eye, stable: Secondary | ICD-10-CM

## 2021-06-16 DIAGNOSIS — H43813 Vitreous degeneration, bilateral: Secondary | ICD-10-CM

## 2021-06-16 NOTE — Telephone Encounter (Signed)
  Encourage patient to contact the pharmacy for refills or they can request refills through [place]:  04/10/2021  NEXT APPOINTMENT DATE:  MEDICATION: metFORMIN (GLUCOPHAGE-XR) 750 MG 24 hr tablet  PHARMACY: [facility], [place]  Let patient know to contact pharmacy at the end of the day to make sure medication is ready.  Please notify patient to allow 48-72 hours to process

## 2021-06-17 ENCOUNTER — Other Ambulatory Visit: Payer: Self-pay

## 2021-06-17 DIAGNOSIS — E8881 Metabolic syndrome: Secondary | ICD-10-CM

## 2021-06-17 DIAGNOSIS — R0683 Snoring: Secondary | ICD-10-CM

## 2021-06-17 DIAGNOSIS — E038 Other specified hypothyroidism: Secondary | ICD-10-CM

## 2021-06-17 DIAGNOSIS — G4730 Sleep apnea, unspecified: Secondary | ICD-10-CM

## 2021-06-17 DIAGNOSIS — I25700 Atherosclerosis of coronary artery bypass graft(s), unspecified, with unstable angina pectoris: Secondary | ICD-10-CM

## 2021-06-17 DIAGNOSIS — F324 Major depressive disorder, single episode, in partial remission: Secondary | ICD-10-CM

## 2021-06-17 DIAGNOSIS — Z951 Presence of aortocoronary bypass graft: Secondary | ICD-10-CM

## 2021-06-17 MED ORDER — METFORMIN HCL ER 750 MG PO TB24: 750.0000 mg | ORAL_TABLET | Freq: Every day | ORAL | 0 refills | Status: DC

## 2021-06-17 NOTE — Telephone Encounter (Signed)
Medication has been filled 

## 2021-06-23 DIAGNOSIS — Z951 Presence of aortocoronary bypass graft: Secondary | ICD-10-CM | POA: Diagnosis not present

## 2021-06-23 DIAGNOSIS — I10 Essential (primary) hypertension: Secondary | ICD-10-CM | POA: Diagnosis not present

## 2021-06-23 DIAGNOSIS — K7689 Other specified diseases of liver: Secondary | ICD-10-CM | POA: Diagnosis not present

## 2021-06-23 DIAGNOSIS — Z87891 Personal history of nicotine dependence: Secondary | ICD-10-CM | POA: Diagnosis not present

## 2021-06-23 DIAGNOSIS — Z955 Presence of coronary angioplasty implant and graft: Secondary | ICD-10-CM | POA: Diagnosis not present

## 2021-06-23 DIAGNOSIS — R932 Abnormal findings on diagnostic imaging of liver and biliary tract: Secondary | ICD-10-CM | POA: Diagnosis not present

## 2021-06-23 DIAGNOSIS — I2510 Atherosclerotic heart disease of native coronary artery without angina pectoris: Secondary | ICD-10-CM | POA: Diagnosis not present

## 2021-06-23 DIAGNOSIS — Z7982 Long term (current) use of aspirin: Secondary | ICD-10-CM | POA: Diagnosis not present

## 2021-06-23 DIAGNOSIS — I252 Old myocardial infarction: Secondary | ICD-10-CM | POA: Diagnosis not present

## 2021-06-26 ENCOUNTER — Telehealth: Payer: Self-pay | Admitting: Cardiovascular Disease

## 2021-06-26 DIAGNOSIS — Z951 Presence of aortocoronary bypass graft: Secondary | ICD-10-CM

## 2021-06-26 DIAGNOSIS — I10 Essential (primary) hypertension: Secondary | ICD-10-CM

## 2021-06-26 NOTE — Telephone Encounter (Signed)
Dr. Gertie Fey is calling to speak with Dr. Gwenlyn Found stating they did a liver biopsy that suggest there might be elevated right sided heart pressures. Due to this he is wanting to discuss if Dr. Gwenlyn Found feels comfortable reordering an Echo. The patient advised him Dr. Gwenlyn Found had discussed possibly doing this and he feels it should be done sooner rather than later. Dr. Gertie Fey left his cell for callback states it is not urgent and if he doesn't answer when calling back you can leave a VM.

## 2021-06-26 NOTE — Telephone Encounter (Signed)
Spoke with Dr. Gertie Fey, per Dr. Gwenlyn Found ok to order echo. Will place orders and have scheduling call pt to set up appointment time. Dr. Gertie Fey is appreciative.

## 2021-07-04 ENCOUNTER — Other Ambulatory Visit: Payer: Self-pay | Admitting: Family Medicine

## 2021-07-04 DIAGNOSIS — G4730 Sleep apnea, unspecified: Secondary | ICD-10-CM

## 2021-07-04 DIAGNOSIS — R0683 Snoring: Secondary | ICD-10-CM

## 2021-07-04 DIAGNOSIS — I25700 Atherosclerosis of coronary artery bypass graft(s), unspecified, with unstable angina pectoris: Secondary | ICD-10-CM

## 2021-07-04 DIAGNOSIS — Z951 Presence of aortocoronary bypass graft: Secondary | ICD-10-CM

## 2021-07-04 DIAGNOSIS — F324 Major depressive disorder, single episode, in partial remission: Secondary | ICD-10-CM

## 2021-07-04 DIAGNOSIS — E8881 Metabolic syndrome: Secondary | ICD-10-CM

## 2021-07-04 DIAGNOSIS — E038 Other specified hypothyroidism: Secondary | ICD-10-CM

## 2021-07-07 ENCOUNTER — Encounter: Payer: Self-pay | Admitting: Cardiovascular Disease

## 2021-07-10 ENCOUNTER — Encounter: Payer: Self-pay | Admitting: Pharmacist Clinician (PhC)/ Clinical Pharmacy Specialist

## 2021-07-10 ENCOUNTER — Ambulatory Visit (INDEPENDENT_AMBULATORY_CARE_PROVIDER_SITE_OTHER): Payer: BC Managed Care – PPO | Admitting: Pharmacist Clinician (PhC)/ Clinical Pharmacy Specialist

## 2021-07-10 ENCOUNTER — Other Ambulatory Visit: Payer: Self-pay

## 2021-07-10 VITALS — BP 110/82 | HR 66 | Resp 17 | Ht 67.5 in | Wt 161.4 lb

## 2021-07-10 DIAGNOSIS — I10 Essential (primary) hypertension: Secondary | ICD-10-CM

## 2021-07-10 MED ORDER — OLMESARTAN MEDOXOMIL 20 MG PO TABS: 20.0000 mg | ORAL_TABLET | Freq: Every day | ORAL | 6 refills | Status: DC

## 2021-07-10 NOTE — Assessment & Plan Note (Signed)
Patient with essential hypertension, at goal, however had to discontinue amlodipine secondary to lower extremity edema.  Will start olmesartan 20 mg and have him continue with home monitoring.  Check metabolic panel in 2 weeks and return in 6 weeks for follow up.  Advised patient that if home readings stay WNL atfer 4-5 weeks, he can send a message through My Chart and we can determine if follow up appointment is needed.

## 2021-07-10 NOTE — Patient Instructions (Signed)
Return for a a follow up appointment Tuesday Feb 7 at 9 am  Go to the lab in 2 weeks to check kidney function (around Jan 12-18)  Check your blood pressure at home daily and keep record of the readings.  If you notice that home readings are all good after 4-5 weeks you can send a My Chart message and let us know.  We may not need the follow up appointment  Take your BP meds as follows:  Stop amlodipine  Start olmesartan 20 mg once daily  Bring all of your meds, your BP cuff and your record of home blood pressures to your next appointment.  Exercise as youre able, try to walk approximately 30 minutes per day.  Keep salt intake to a minimum, especially watch canned and prepared boxed foods.  Eat more fresh fruits and vegetables and fewer canned items.  Avoid eating in fast food restaurants.    HOW TO TAKE YOUR BLOOD PRESSURE: Rest 5 minutes before taking your blood pressure.  Dont smoke or drink caffeinated beverages for at least 30 minutes before. Take your blood pressure before (not after) you eat. Sit comfortably with your back supported and both feet on the floor (dont cross your legs). Elevate your arm to heart level on a table or a desk. Use the proper sized cuff. It should fit smoothly and snugly around your bare upper arm. There should be enough room to slip a fingertip under the cuff. The bottom edge of the cuff should be 1 inch above the crease of the elbow. Ideally, take 3 measurements at one sitting and record the averag

## 2021-07-10 NOTE — Progress Notes (Signed)
07/10/2021 ILIA ENGELBERT [birth year] [id number]   HPI:  Shane [name] is a 55 y.o. male patient of Dr Gwenlyn Found, with a PMH below who presents today for hypertension clinic evaluation.  He was seen by Dr. Gwenlyn Found last month, and while his blood pressure in the office was just above goal, he noted that home readings had been trending upward.  He was started on amlodipine 5 mg daily and asked to follow up with CVRR after a month.  Earlier this week he sent a message to the office informing Dr. Gwenlyn Found that his ankles were swollen to the point of being painful.  He was told to discontinue amlodipine and keep appointment for Dec 30  Today he is in for follow up.  Stopped the amlodipine 2 days ago.  Ankles still puffy, but he notes definitely better than earlier this week.  Patient state he had a recent liver biopsy but it was clean.     Past Medical History: hyperlipidemia 3/22 LDL 48 on rosuvastatin 20  CAD S/p STEMI (2011), CABG x 4 (2015)  PVC's Asymptomatic now, on carvedilol 6.25 bid  OSA Recently seen by Dr. Claiborne Billings for potential treatment, does not tolerate CPAP  Primary biliary cirrhosis Newer dx, being evaluated at [facility]     Blood Pressure Goal:  130/80  Current Medications: carvedilol 6.25 mg bid  Family Hx: father  with CABG; father died at 67; mother no heart disease died at 68; older brother with vlave replaced, younger with CABG at  (first MI at 77); 2 sons 69,21 no issues yet  Social Hx: no tobacco, no alcohol, coffee home brew 1 cup per day  Diet: no added salt, only occasional deep fried; not much for meat, mostly chicken; not many vegetables, Orders out more than eating in; some cookies and crackers  Exercise: none  Home BP readings: home meter in 46+ years old, but reads within 10 points   20 home readings average 119/79 HR 70  (range 106-140/65-99)  Intolerances:  nkda  Labs: 10/22: Na 138, K 4.9, Glu 89, BUN 13, SCr 0.88, GFR >90   Wt Readings from Last 3  Encounters:  07/10/21 161 lb 6.4 oz (73.2 kg)  06/12/21 155 lb 9.6 oz (70.6 kg)  06/09/21 153 lb 12.8 oz (69.8 kg)   BP Readings from Last 3 Encounters:  07/10/21 110/82  06/12/21 105/68  06/09/21 134/90   Pulse Readings from Last 3 Encounters:  07/10/21 66  06/12/21 74  06/09/21 78    Current Outpatient Medications  Medication Sig Dispense Refill   ARIPiprazole (ABILIFY) 5 MG tablet Take 1 tablet (5 mg total) by mouth daily. 90 tablet 1   aspirin 81 MG tablet Take 81 mg by mouth daily.     BESIVANCE 0.6 % SUSP as needed.   1   carvedilol (COREG) 6.25 MG tablet Take 1 tablet by mouth twice daily. 180 tablet 3   escitalopram (LEXAPRO) 20 MG tablet Take 1 tablet (20 mg total) by mouth daily. 90 tablet 1   ibuprofen (ADVIL,MOTRIN) 200 MG tablet Take 200 mg by mouth every 6 (six) hours as needed.     LORazepam (ATIVAN) 0.5 MG tablet Take 1 tablet (0.5 mg total) by mouth 2 (two) times daily. 60 tablet 1   metFORMIN (GLUCOPHAGE-XR) 750 MG 24 hr tablet Take 1 tablet (750 mg total) by mouth daily with breakfast. 30 tablet 0   nitroGLYCERIN (NITROSTAT) 0.4 MG SL tablet Place 1 tablet (0.4 mg  total) under the tongue every 5 (five) minutes as needed for chest pain. 25 tablet 6   NONFORMULARY OR COMPOUNDED ITEM Shots for macular degeneration left eye     olmesartan (BENICAR) 20 MG tablet Take 1 tablet (20 mg total) by mouth daily. 30 tablet 6   rosuvastatin (CRESTOR) 20 MG tablet Take 1 tablet by mouth daily. **Please call to schedule office visit prior to further refills** 90 tablet 3   ursodiol (ACTIGALL) 500 MG tablet Take 500 mg by mouth 2 (two) times daily.     No current facility-administered medications for this visit.    No Known Allergies  Past Medical History:  Diagnosis Date   CAD (coronary artery disease), s/p stent 2011 and CABG 2015 [facility])    Hodgkin disease [facility]) 1997   Hyperlipidemia    Macular degeneration    PVC's (premature ventricular contractions)      Blood pressure 110/82, pulse 66, resp. rate 17, height 5' 7.5" (1.715 m), weight 161 lb 6.4 oz (73.2 kg), SpO2 97 %.  Essential hypertension Patient with essential hypertension, at goal, however had to discontinue amlodipine secondary to lower extremity edema.  Will start olmesartan 20 mg and have him continue with home monitoring.  Check metabolic panel in 2 weeks and return in 6 weeks for follow up.  Advised patient that if home readings stay WNL atfer 4-5 weeks, he can send a message through My Chart and we can determine if follow up appointment is needed.    Tommy Medal PharmD CPP [facility] [address] [place], [place] [address] [phone]

## 2021-07-15 ENCOUNTER — Ambulatory Visit (HOSPITAL_COMMUNITY): Payer: BC Managed Care – PPO | Attending: Cardiology

## 2021-07-15 ENCOUNTER — Other Ambulatory Visit: Payer: Self-pay

## 2021-07-15 DIAGNOSIS — I10 Essential (primary) hypertension: Secondary | ICD-10-CM

## 2021-07-15 DIAGNOSIS — Z951 Presence of aortocoronary bypass graft: Secondary | ICD-10-CM | POA: Insufficient documentation

## 2021-07-15 LAB — ECHOCARDIOGRAM COMPLETE
MV M vel: 4.04 m/s
MV Peak grad: 65.2 mmHg
S' Lateral: 3.9 cm

## 2021-07-16 ENCOUNTER — Ambulatory Visit: Payer: BC Managed Care – PPO | Admitting: Psychiatry

## 2021-07-17 ENCOUNTER — Ambulatory Visit: Payer: BC Managed Care – PPO | Admitting: Psychiatry

## 2021-07-22 DIAGNOSIS — F3110 Bipolar disorder, current episode manic without psychotic features, unspecified: Secondary | ICD-10-CM | POA: Diagnosis not present

## 2021-07-22 DIAGNOSIS — F411 Generalized anxiety disorder: Secondary | ICD-10-CM | POA: Diagnosis not present

## 2021-07-22 DIAGNOSIS — F902 Attention-deficit hyperactivity disorder, combined type: Secondary | ICD-10-CM | POA: Diagnosis not present

## 2021-07-22 DIAGNOSIS — R251 Tremor, unspecified: Secondary | ICD-10-CM | POA: Diagnosis not present

## 2021-07-24 ENCOUNTER — Encounter: Payer: Self-pay | Admitting: Cardiovascular Disease

## 2021-07-24 ENCOUNTER — Other Ambulatory Visit: Payer: Self-pay

## 2021-07-24 ENCOUNTER — Ambulatory Visit: Payer: BC Managed Care – PPO | Admitting: Cardiovascular Disease

## 2021-07-24 DIAGNOSIS — I519 Heart disease, unspecified: Secondary | ICD-10-CM | POA: Diagnosis not present

## 2021-07-24 MED ORDER — SODIUM CHLORIDE 0.9% FLUSH: 3.0000 mL | Freq: Two times a day (BID) | INTRAVENOUS | Status: DC

## 2021-07-24 NOTE — Addendum Note (Signed)
Addended by: Beatrix Fetters on: 07/24/2021 10:54 AM   Modules accepted: Orders

## 2021-07-24 NOTE — Assessment & Plan Note (Addendum)
[name] returns today for follow-up of his 2D echo performed 07/15/2021 revealing an EF of 30 to 35% with mild to moderate MR and moderate TR.  His last echo performed 07/02/2016 revealed an EF of 50 to 55% with mild MR and mild TR.  Does complain of increasing dyspnea on exertion.  He denies chest pain.  He had an anterior MI with LAD stenting 08/28/2009 with a Xience DES to his proximal LAD.  Ultimately required CABG 02/27/2014 at [facility] with a LIMA to his LAD, vein to ramus branch, obtuse marginal branch and RCA.  His EF was for 35% at that time but ultimately it improved back to normal.  I am going to perform right left heart cath on him 2 weeks from now to define his anatomy and physiology.  If his grafts are patent I will refer him to our advanced heart failure clinic for optimization of his medications.  He is currently on carvedilol and olmesartan.

## 2021-07-24 NOTE — Progress Notes (Signed)
07/24/2021 Shane [name]   May 22, 1966  [id number]  Primary Physician Shane Arnt, MD Primary Cardiologist: Shane Harp MD Shane [name], [place]  HPI:  Shane [name] is a 57 y.o.  thin appearing married Caucasian male father of 2 children [name] is also a patient of mine who accompanies him today.    I last saw him in the office  06/09/2021.  He works as an Sales promotion account executive. His primary care physician is Shane [name]. Marland Kitchen His cardiac risk factor profile is remarkable for hyperlipidemia and family history the father who had bypass surgery at age 4 and a brother who had bypass surgery as well. He suffered an anterior wall myocardial infarction 08/28/09 with a Xience DES stent placed in his proximal LAD. Because of recurrent symptoms he underwent catheterization in August and ultimately coronary bypass grafting 02/27/14 with a LIMA to his LAD, a vein to ramus branch, obtuse marginal branch and the RCA. His ejection fraction was 35%. He recuperated nicely although he did not participate in cardiac rehabilitation. A 2-D echocardiogram performed 08/27/14 revealed an improvement in his ejection fraction up to 50-55% with anterolateral wall motion abnormality. While honeymooning in [place] he noticed prolonged recovery from exercise activity as well as "just not feeling right". He denies chest pain. I performed a Myoview stress test on him after that on 06/28/16 which was low risk and nonischemic and 2-D echo that revealed preserved LV function with EF of 50-55%. Those symptoms have since subsided.   He has been diagnosed with primary biliary's cirrhosis and is undergoing evaluation at [facility].  He apparently had a recent liver biopsy.  He has lost 12 pounds since I saw him.  He complains of nausea and has been unable to eat a normal diet.  He was complaining of fairly new onset dyspnea on exertion.  Since I saw him 6 weeks ago I did get a 2D  echocardiogram that shows a new decline in LV function to 30 to 35% with mild to moderate MR, moderate TR.  His last echo in our system 07/02/2016 revealed a normal EF and normal valvular function.  Current Meds  Medication Sig   ARIPiprazole (ABILIFY) 5 MG tablet Take 1 tablet (5 mg total) by mouth daily.   aspirin 81 MG tablet Take 81 mg by mouth daily.   BESIVANCE 0.6 % SUSP as needed.    carvedilol (COREG) 6.25 MG tablet Take 1 tablet by mouth twice daily.   escitalopram (LEXAPRO) 20 MG tablet Take 1 tablet (20 mg total) by mouth daily.   ibuprofen (ADVIL,MOTRIN) 200 MG tablet Take 200 mg by mouth every 6 (six) hours as needed.   LORazepam (ATIVAN) 0.5 MG tablet Take 1 tablet (0.5 mg total) by mouth 2 (two) times daily.   metFORMIN (GLUCOPHAGE-XR) 750 MG 24 hr tablet Take 1 tablet (750 mg total) by mouth daily with breakfast.   nitroGLYCERIN (NITROSTAT) 0.4 MG SL tablet Place 1 tablet (0.4 mg total) under the tongue every 5 (five) minutes as needed for chest pain.   NONFORMULARY OR COMPOUNDED ITEM Shots for macular degeneration left eye   olmesartan (BENICAR) 20 MG tablet Take 1 tablet (20 mg total) by mouth daily.   rosuvastatin (CRESTOR) 20 MG tablet Take 1 tablet by mouth daily. **Please call to schedule office visit prior to further refills**   ursodiol (ACTIGALL) 500 MG tablet Take 500 mg by mouth 2 (two) times daily.  No Known Allergies  Social History   Socioeconomic History   Marital status: Married    Spouse name: Not on file   Number of children: Not on file   Years of education: Not on file   Highest education level: Not on file  Occupational History   Not on file  Tobacco Use   Smoking status: Never   Smokeless tobacco: Former   Tobacco comments:    a long time ago  Vaping Use   Vaping Use: Never used  Substance and Sexual Activity   Alcohol use: Yes    Alcohol/week: 3.0 standard drinks    Types: 3 Cans of beer per week    Comment: sometimes 3 beers daily    Drug use: No   Sexual activity: Yes  Other Topics Concern   Not on file  Social History Narrative   Not on file   Social Determinants of Health   Financial Resource Strain: Not on file  Food Insecurity: Not on file  Transportation Needs: Not on file  Physical Activity: Not on file  Stress: Not on file  Social Connections: Not on file  Intimate Partner Violence: Not on file     Review of Systems: General: negative for chills, fever, night sweats or weight changes.  Cardiovascular: negative for chest pain, dyspnea on exertion, edema, orthopnea, palpitations, paroxysmal nocturnal dyspnea or shortness of breath Dermatological: negative for rash Respiratory: negative for cough or wheezing Urologic: negative for hematuria Abdominal: negative for nausea, vomiting, diarrhea, bright red blood per rectum, melena, or hematemesis Neurologic: negative for visual changes, syncope, or dizziness All other systems reviewed and are otherwise negative except as noted above.    Blood pressure 110/86, pulse 65, height 5\' 8"  (1.727 m), weight 158 lb 3.2 oz (71.8 kg), SpO2 96 %.  General appearance: alert and no distress Neck: no adenopathy, no carotid bruit, no JVD, supple, symmetrical, trachea midline, and thyroid not enlarged, symmetric, no tenderness/mass/nodules Lungs: clear to auscultation bilaterally Heart: regular rate and rhythm, S1, S2 normal, no murmur, click, rub or gallop Extremities: extremities normal, atraumatic, no cyanosis or edema Pulses: 2+ and symmetric Skin: Skin color, texture, turgor normal. No rashes or lesions Neurologic: Grossly normal  EKG sinus rhythm at 65 with septal Q waves, nonspecific IVCD and left axis deviation.  I personally reviewed this EKG.  ASSESSMENT AND PLAN:   LV dysfunction [name] returns today for follow-up of his 2D echo performed 07/15/2021 revealing an EF of 30 to 35% with mild to moderate MR and moderate TR.  His last echo performed  07/02/2016 revealed an EF of 50 to 55% with mild MR and mild TR.  Does complain of increasing dyspnea on exertion.  He denies chest pain.  He had an anterior MI with LAD stenting 08/28/2009 with a Xience DES to his proximal LAD.  Ultimately required CABG 02/27/2014 at [facility] with a LIMA to his LAD, vein to ramus branch, obtuse marginal branch and RCA.  His EF was for 35% at that time but ultimately it improved back to normal.  I am going to perform right left heart cath on him 2 weeks from now to define his anatomy and physiology.  If his grafts are patent I will refer him to our advanced heart failure clinic for optimization of his medications.  He is currently on carvedilol and olmesartan.     Shane Harp MD FACP,FACC,FAHA, [facility] 07/24/2021 10:38 AM

## 2021-07-24 NOTE — H&P (View-Only) (Signed)
07/24/2021 [name]   [birth year]  [id number]  Primary Physician Leamon Arnt, MD Primary Cardiologist: Lorretta Harp MD Shane [name], [place]  HPI:  Shane [name] is a 56 y.o.  thin appearing married Caucasian male father of 2 children [name] is also a patient of mine who accompanies him today.    I last saw him in the office  06/09/2021.  He works as an Sales promotion account executive. His primary care physician is Dr. Cecille Amsterdam. Marland Kitchen His cardiac risk factor profile is remarkable for hyperlipidemia and family history the father who had bypass surgery at age 60 and a brother who had bypass surgery as well. He suffered an anterior wall myocardial infarction 08/28/09 with a Xience DES stent placed in his proximal LAD. Because of recurrent symptoms he underwent catheterization in August and ultimately coronary bypass grafting 02/27/14 with a LIMA to his LAD, a vein to ramus branch, obtuse marginal branch and the RCA. His ejection fraction was 35%. He recuperated nicely although he did not participate in cardiac rehabilitation. A 2-D echocardiogram performed 08/27/14 revealed an improvement in his ejection fraction up to 50-55% with anterolateral wall motion abnormality. While honeymooning in [place] he noticed prolonged recovery from exercise activity as well as "just not feeling right". He denies chest pain. I performed a Myoview stress test on him after that on 06/28/16 which was low risk and nonischemic and 2-D echo that revealed preserved LV function with EF of 50-55%. Those symptoms have since subsided.   He has been diagnosed with primary biliary's cirrhosis and is undergoing evaluation at [facility].  He apparently had a recent liver biopsy.  He has lost 12 pounds since I saw him.  He complains of nausea and has been unable to eat a normal diet.  He was complaining of fairly new onset dyspnea on exertion.  Since I saw him 6 weeks ago I did get a 2D  echocardiogram that shows a new decline in LV function to 30 to 35% with mild to moderate MR, moderate TR.  His last echo in our system 07/02/2016 revealed a normal EF and normal valvular function.  Current Meds  Medication Sig   ARIPiprazole (ABILIFY) 5 MG tablet Take 1 tablet (5 mg total) by mouth daily.   aspirin 81 MG tablet Take 81 mg by mouth daily.   BESIVANCE 0.6 % SUSP as needed.    carvedilol (COREG) 6.25 MG tablet Take 1 tablet by mouth twice daily.   escitalopram (LEXAPRO) 20 MG tablet Take 1 tablet (20 mg total) by mouth daily.   ibuprofen (ADVIL,MOTRIN) 200 MG tablet Take 200 mg by mouth every 6 (six) hours as needed.   LORazepam (ATIVAN) 0.5 MG tablet Take 1 tablet (0.5 mg total) by mouth 2 (two) times daily.   metFORMIN (GLUCOPHAGE-XR) 750 MG 24 hr tablet Take 1 tablet (750 mg total) by mouth daily with breakfast.   nitroGLYCERIN (NITROSTAT) 0.4 MG SL tablet Place 1 tablet (0.4 mg total) under the tongue every 5 (five) minutes as needed for chest pain.   NONFORMULARY OR COMPOUNDED ITEM Shots for macular degeneration left eye   olmesartan (BENICAR) 20 MG tablet Take 1 tablet (20 mg total) by mouth daily.   rosuvastatin (CRESTOR) 20 MG tablet Take 1 tablet by mouth daily. **Please call to schedule office visit prior to further refills**   ursodiol (ACTIGALL) 500 MG tablet Take 500 mg by mouth 2 (two) times daily.  No Known Allergies  Social History   Socioeconomic History   Marital status: Married    Spouse name: Not on file   Number of children: Not on file   Years of education: Not on file   Highest education level: Not on file  Occupational History   Not on file  Tobacco Use   Smoking status: Never   Smokeless tobacco: Former   Tobacco comments:    a long time ago  Vaping Use   Vaping Use: Never used  Substance and Sexual Activity   Alcohol use: Yes    Alcohol/week: 3.0 standard drinks    Types: 3 Cans of beer per week    Comment: sometimes 3 beers daily    Drug use: No   Sexual activity: Yes  Other Topics Concern   Not on file  Social History Narrative   Not on file   Social Determinants of Health   Financial Resource Strain: Not on file  Food Insecurity: Not on file  Transportation Needs: Not on file  Physical Activity: Not on file  Stress: Not on file  Social Connections: Not on file  Intimate Partner Violence: Not on file     Review of Systems: General: negative for chills, fever, night sweats or weight changes.  Cardiovascular: negative for chest pain, dyspnea on exertion, edema, orthopnea, palpitations, paroxysmal nocturnal dyspnea or shortness of breath Dermatological: negative for rash Respiratory: negative for cough or wheezing Urologic: negative for hematuria Abdominal: negative for nausea, vomiting, diarrhea, bright red blood per rectum, melena, or hematemesis Neurologic: negative for visual changes, syncope, or dizziness All other systems reviewed and are otherwise negative except as noted above.    Blood pressure 110/86, pulse 65, height 5\' 8"  (1.727 m), weight 158 lb 3.2 oz (71.8 kg), SpO2 96 %.  General appearance: alert and no distress Neck: no adenopathy, no carotid bruit, no JVD, supple, symmetrical, trachea midline, and thyroid not enlarged, symmetric, no tenderness/mass/nodules Lungs: clear to auscultation bilaterally Heart: regular rate and rhythm, S1, S2 normal, no murmur, click, rub or gallop Extremities: extremities normal, atraumatic, no cyanosis or edema Pulses: 2+ and symmetric Skin: Skin color, texture, turgor normal. No rashes or lesions Neurologic: Grossly normal  EKG sinus rhythm at 65 with septal Q waves, nonspecific IVCD and left axis deviation.  I personally reviewed this EKG.  ASSESSMENT AND PLAN:   LV dysfunction [name] returns today for follow-up of his 2D echo performed 07/15/2021 revealing an EF of 30 to 35% with mild to moderate MR and moderate TR.  His last echo performed  07/02/2016 revealed an EF of 50 to 55% with mild MR and mild TR.  Does complain of increasing dyspnea on exertion.  He denies chest pain.  He had an anterior MI with LAD stenting 08/28/2009 with a Xience DES to his proximal LAD.  Ultimately required CABG 02/27/2014 at [facility] with a LIMA to his LAD, vein to ramus branch, obtuse marginal branch and RCA.  His EF was for 35% at that time but ultimately it improved back to normal.  I am going to perform right left heart cath on him 2 weeks from now to define his anatomy and physiology.  If his grafts are patent I will refer him to our advanced heart failure clinic for optimization of his medications.  He is currently on carvedilol and olmesartan.     Lorretta Harp MD FACP,FACC,FAHA, [facility] 07/24/2021 10:38 AM

## 2021-07-24 NOTE — Patient Instructions (Addendum)
Medication Instructions:  Your physician recommends that you continue on your current medications as directed. Please refer to the Current Medication list given to you today.  *If you need a refill on your cardiac medications before your next appointment, please call your pharmacy*   Lab Work: Your physician recommends that you have labs drawn today: BMET and CBC  If you have labs (blood work) drawn today and your tests are completely normal, you will receive your results only by: [place] (if you have MyChart) OR A paper copy in the mail If you have any lab test that is abnormal or we need to change your treatment, we will call you to review the results.   Testing/Procedures: See below   Follow-Up: At [facility], you and your health needs are our priority.  As part of our continuing mission to provide you with exceptional heart care, we have created designated Provider Care Teams.  These Care Teams include your primary Cardiologist (physician) and Advanced Practice Providers (APPs -  Physician Assistants and Nurse Practitioners) who all work together to provide you with the care you need, when you need it.  We recommend signing up for the patient portal called "MyChart".  Sign up information is provided on this After Visit Summary.  MyChart is used to connect with patients for Virtual Visits (Telemedicine).  Patients are able to view lab/test results, encounter notes, upcoming appointments, etc.  Non-urgent messages can be sent to your provider as well.   To learn more about what you can do with MyChart, go to [email or web address].    Your next appointment:   2-3 week(s)  The format for your next appointment:   In Person  Provider:   Quay Burow, MD   Other Instructions  [place] [address] Dept: [phone] Loc: [phone]  Shane [name]  07/24/2021  You are scheduled for a Cardiac Catheterization on Monday, January 23 with Dr. Quay Burow.  1. Please arrive at the [facility] (Main Entrance A) at [facility]: [address] [place], [place] [address] at 9:30 AM (This time is two hours before your procedure to ensure your preparation). Free valet parking service is available.   Special note: Every effort is made to have your procedure done on time. Please understand that emergencies sometimes delay scheduled procedures.  2. Diet: Do not eat solid foods after midnight.  The patient may have clear liquids until 5am upon the day of the procedure.  3. Labs: You will need to have blood drawn today.  4. Medication instructions in preparation for your procedure:    Do not take Diabetes Med Glucophage (Metformin) on the day of the procedure and HOLD 48 HOURS AFTER THE PROCEDURE.  On the morning of your procedure, take your Aspirin and any morning medicines NOT listed above.  You may use sips of water.  5. Plan for one night stay--bring personal belongings. 6. Bring a current list of your medications and current insurance cards. 7. You MUST have a responsible person to drive you home. 8. Someone MUST be with you the first 24 hours after you arrive home or your discharge will be delayed. 9. Please wear clothes that are easy to get on and off and wear slip-on shoes.  Thank you for allowing [place] to care for you!   -- [place] [facility]

## 2021-07-25 LAB — COMPREHENSIVE METABOLIC PANEL
ALT: 16 IU/L (ref 0–44)
AST: 21 IU/L (ref 0–40)
Albumin/Globulin Ratio: 1.7 (ref 1.2–2.2)
Albumin: 4.3 g/dL (ref 3.8–4.9)
Alkaline Phosphatase: 232 IU/L — ABNORMAL HIGH (ref 44–121)
BUN/Creatinine Ratio: 16 (ref 9–20)
BUN: 15 mg/dL (ref 6–24)
Bilirubin Total: 1.7 mg/dL — ABNORMAL HIGH (ref 0.0–1.2)
CO2: 25 mmol/L (ref 20–29)
Calcium: 9 mg/dL (ref 8.7–10.2)
Chloride: 103 mmol/L (ref 96–106)
Creatinine, Ser: 0.96 mg/dL (ref 0.76–1.27)
Globulin, Total: 2.5 g/dL (ref 1.5–4.5)
Glucose: 106 mg/dL — ABNORMAL HIGH (ref 70–99)
Potassium: 5.4 mmol/L — ABNORMAL HIGH (ref 3.5–5.2)
Sodium: 140 mmol/L (ref 134–144)
Total Protein: 6.8 g/dL (ref 6.0–8.5)
eGFR: 93 mL/min/{1.73_m2} (ref >59)

## 2021-07-25 LAB — CBC
Hematocrit: 42.1 % (ref 37.5–51.0)
Hemoglobin: 14.1 g/dL (ref 13.0–17.7)
MCH: 30.8 pg (ref 26.6–33.0)
MCHC: 33.5 g/dL (ref 31.5–35.7)
MCV: 92 fL (ref 79–97)
Platelets: 228 10*3/uL (ref 150–450)
RBC: 4.58 x10E6/uL (ref 4.14–5.80)
RDW: 14.3 % (ref 11.6–15.4)
WBC: 6 10*3/uL (ref 3.4–10.8)

## 2021-07-30 ENCOUNTER — Telehealth: Payer: Self-pay | Admitting: *Deleted

## 2021-07-30 NOTE — Telephone Encounter (Signed)
Cardiac catheterization scheduled at [facility] for: Monday August 03, 2021 11:30 [facility] Main Entrance A [facility]) at: 9:30 AM   Diet-no solid food after midnight prior to cath, clear liquids until 5 AM day of procedure.  Medication instructions for procedure: -Hold:  Metformin-day of procedure and 48 hours post procedure -Except hold medications usual morning medications can be taken pre-cath with sips of water including aspirin 81 mg.    Confirmed patient has responsible adult to drive home post procedure and be with patient first 24 hours after arriving home.  [facility] does allow one visitor to accompany you and wait in the hospital waiting room while you are there for your procedure. You and your visitor will be asked to wear a mask once you enter the hospital.   Patient reports does not currently have any new symptoms concerning for COVID-19 and no household members with COVID-19 like illness.    Call placed to patient to review procedure instructions, no answer, unable to leave voicemail message.

## 2021-07-30 NOTE — Telephone Encounter (Signed)
Call placed to patient to review procedure instructions, mailbox full, unable to leave a message.

## 2021-07-31 NOTE — Telephone Encounter (Signed)
Reviewed procedure/mask/visitor instructions with patient. 

## 2021-08-03 ENCOUNTER — Encounter (HOSPITAL_COMMUNITY)
Admission: RE | Disposition: A | Discharge status: Home / Self Care | Payer: Self-pay | Source: Home / Self Care | Attending: Cardiovascular Disease

## 2021-08-03 ENCOUNTER — Inpatient Hospital Stay (HOSPITAL_COMMUNITY)
Admission: RE | Admit: 2021-08-03 | Discharge: 2021-08-06 | DRG: 286 | Disposition: A | Discharge status: Home / Self Care | Payer: BC Managed Care – PPO | Attending: Cardiovascular Disease | Admitting: Cardiovascular Disease

## 2021-08-03 DIAGNOSIS — I5021 Acute systolic (congestive) heart failure: Secondary | ICD-10-CM

## 2021-08-03 DIAGNOSIS — Z923 Personal history of irradiation: Secondary | ICD-10-CM

## 2021-08-03 DIAGNOSIS — H3530 Unspecified macular degeneration: Secondary | ICD-10-CM | POA: Diagnosis present

## 2021-08-03 DIAGNOSIS — I081 Rheumatic disorders of both mitral and tricuspid valves: Secondary | ICD-10-CM | POA: Diagnosis not present

## 2021-08-03 DIAGNOSIS — I519 Heart disease, unspecified: Secondary | ICD-10-CM | POA: Diagnosis not present

## 2021-08-03 DIAGNOSIS — Z825 Family history of asthma and other chronic lower respiratory diseases: Secondary | ICD-10-CM

## 2021-08-03 DIAGNOSIS — Z809 Family history of malignant neoplasm, unspecified: Secondary | ICD-10-CM | POA: Diagnosis not present

## 2021-08-03 DIAGNOSIS — Z7984 Long term (current) use of oral hypoglycemic drugs: Secondary | ICD-10-CM

## 2021-08-03 DIAGNOSIS — Z8572 Personal history of non-Hodgkin lymphomas: Secondary | ICD-10-CM | POA: Diagnosis not present

## 2021-08-03 DIAGNOSIS — I255 Ischemic cardiomyopathy: Secondary | ICD-10-CM | POA: Diagnosis present

## 2021-08-03 DIAGNOSIS — Z823 Family history of stroke: Secondary | ICD-10-CM

## 2021-08-03 DIAGNOSIS — I2510 Atherosclerotic heart disease of native coronary artery without angina pectoris: Secondary | ICD-10-CM | POA: Diagnosis present

## 2021-08-03 DIAGNOSIS — Z8249 Family history of ischemic heart disease and other diseases of the circulatory system: Secondary | ICD-10-CM

## 2021-08-03 DIAGNOSIS — R110 Nausea: Secondary | ICD-10-CM | POA: Diagnosis present

## 2021-08-03 DIAGNOSIS — I5023 Acute on chronic systolic (congestive) heart failure: Secondary | ICD-10-CM | POA: Diagnosis not present

## 2021-08-03 DIAGNOSIS — I3139 Other pericardial effusion (noninflammatory): Secondary | ICD-10-CM | POA: Diagnosis not present

## 2021-08-03 DIAGNOSIS — E785 Hyperlipidemia, unspecified: Secondary | ICD-10-CM | POA: Diagnosis present

## 2021-08-03 DIAGNOSIS — Z79899 Other long term (current) drug therapy: Secondary | ICD-10-CM | POA: Diagnosis not present

## 2021-08-03 DIAGNOSIS — Z7982 Long term (current) use of aspirin: Secondary | ICD-10-CM

## 2021-08-03 DIAGNOSIS — Z955 Presence of coronary angioplasty implant and graft: Secondary | ICD-10-CM

## 2021-08-03 DIAGNOSIS — G4733 Obstructive sleep apnea (adult) (pediatric): Secondary | ICD-10-CM | POA: Diagnosis present

## 2021-08-03 DIAGNOSIS — E876 Hypokalemia: Secondary | ICD-10-CM | POA: Diagnosis not present

## 2021-08-03 DIAGNOSIS — Z8571 Personal history of Hodgkin lymphoma: Secondary | ICD-10-CM | POA: Diagnosis not present

## 2021-08-03 DIAGNOSIS — I110 Hypertensive heart disease with heart failure: Principal | ICD-10-CM | POA: Diagnosis present

## 2021-08-03 DIAGNOSIS — I5082 Biventricular heart failure: Secondary | ICD-10-CM | POA: Diagnosis not present

## 2021-08-03 DIAGNOSIS — I25810 Atherosclerosis of coronary artery bypass graft(s) without angina pectoris: Secondary | ICD-10-CM | POA: Diagnosis present

## 2021-08-03 DIAGNOSIS — I252 Old myocardial infarction: Secondary | ICD-10-CM | POA: Diagnosis not present

## 2021-08-03 DIAGNOSIS — I493 Ventricular premature depolarization: Secondary | ICD-10-CM | POA: Diagnosis not present

## 2021-08-03 DIAGNOSIS — Z9221 Personal history of antineoplastic chemotherapy: Secondary | ICD-10-CM | POA: Diagnosis not present

## 2021-08-03 HISTORY — PX: RIGHT/LEFT HEART CATH AND CORONARY ANGIOGRAPHY: CATH118266

## 2021-08-03 LAB — POCT I-STAT EG7
Acid-base deficit: 1 mmol/L (ref 0.0–2.0)
Acid-base deficit: 1 mmol/L (ref 0.0–2.0)
Bicarbonate: 23.8 mmol/L (ref 20.0–28.0)
Bicarbonate: 24 mmol/L (ref 20.0–28.0)
Calcium, Ion: 1.18 mmol/L (ref 1.15–1.40)
Calcium, Ion: 1.18 mmol/L (ref 1.15–1.40)
HCT: 42 % (ref 39.0–52.0)
HCT: 43 % (ref 39.0–52.0)
Hemoglobin: 14.3 g/dL (ref 13.0–17.0)
Hemoglobin: 14.6 g/dL (ref 13.0–17.0)
O2 Saturation: 61 %
O2 Saturation: 62 %
Potassium: 3.8 mmol/L (ref 3.5–5.1)
Potassium: 3.9 mmol/L (ref 3.5–5.1)
Sodium: 139 mmol/L (ref 135–145)
Sodium: 140 mmol/L (ref 135–145)
TCO2: 25 mmol/L (ref 22–32)
TCO2: 25 mmol/L (ref 22–32)
pCO2, Ven: 38.9 mmHg — ABNORMAL LOW (ref 44.0–60.0)
pCO2, Ven: 39.2 mmHg — ABNORMAL LOW (ref 44.0–60.0)
pH, Ven: 7.394 (ref 7.250–7.430)
pH, Ven: 7.395 (ref 7.250–7.430)
pO2, Ven: 32 mmHg (ref 32.0–45.0)
pO2, Ven: 32 mmHg (ref 32.0–45.0)

## 2021-08-03 LAB — POCT I-STAT 7, (LYTES, BLD GAS, ICA,H+H)
Acid-base deficit: 3 mmol/L — ABNORMAL HIGH (ref 0.0–2.0)
Bicarbonate: 21 mmol/L (ref 20.0–28.0)
Calcium, Ion: 1.13 mmol/L — ABNORMAL LOW (ref 1.15–1.40)
HCT: 41 % (ref 39.0–52.0)
Hemoglobin: 13.9 g/dL (ref 13.0–17.0)
O2 Saturation: 96 %
Potassium: 3.7 mmol/L (ref 3.5–5.1)
Sodium: 140 mmol/L (ref 135–145)
TCO2: 22 mmol/L (ref 22–32)
pCO2 arterial: 32.9 mmHg (ref 32.0–48.0)
pH, Arterial: 7.412 (ref 7.350–7.450)
pO2, Arterial: 78 mmHg — ABNORMAL LOW (ref 83.0–108.0)

## 2021-08-03 LAB — GLUCOSE, CAPILLARY: Glucose-Capillary: 105 mg/dL — ABNORMAL HIGH (ref 70–99)

## 2021-08-03 SURGERY — RIGHT/LEFT HEART CATH AND CORONARY ANGIOGRAPHY
Anesthesia: LOCAL

## 2021-08-03 MED ORDER — HEPARIN (PORCINE) IN NACL 1000-0.9 UT/500ML-% IV SOLN
INTRAVENOUS | Status: DC | PRN
  Administered 2021-08-03 (×2): 500 mL

## 2021-08-03 MED ORDER — ASPIRIN 81 MG PO TABS: 81.0000 mg | ORAL_TABLET | Freq: Every day | ORAL | Status: DC

## 2021-08-03 MED ORDER — FENTANYL CITRATE (PF) 100 MCG/2ML IJ SOLN
INTRAMUSCULAR | Status: AC
  Filled 2021-08-03: qty 2

## 2021-08-03 MED ORDER — HEPARIN (PORCINE) IN NACL 1000-0.9 UT/500ML-% IV SOLN
INTRAVENOUS | Status: AC
  Filled 2021-08-03: qty 1000

## 2021-08-03 MED ORDER — ONDANSETRON HCL 4 MG/2ML IJ SOLN
4.0000 mg | Freq: Four times a day (QID) | INTRAMUSCULAR | Status: DC | PRN
  Administered 2021-08-03: 4 mg via INTRAVENOUS
  Filled 2021-08-03: qty 2

## 2021-08-03 MED ORDER — LIDOCAINE HCL (PF) 1 % IJ SOLN
INTRAMUSCULAR | Status: AC
  Filled 2021-08-03: qty 30

## 2021-08-03 MED ORDER — MORPHINE SULFATE (PF) 2 MG/ML IV SOLN: 2.0000 mg | INTRAVENOUS | Status: DC | PRN

## 2021-08-03 MED ORDER — LABETALOL HCL 5 MG/ML IV SOLN
INTRAVENOUS | Status: AC
  Filled 2021-08-03: qty 4

## 2021-08-03 MED ORDER — ROSUVASTATIN CALCIUM 20 MG PO TABS
20.0000 mg | ORAL_TABLET | Freq: Every day | ORAL | Status: DC
  Administered 2021-08-04 – 2021-08-06 (×3): 20 mg via ORAL
  Filled 2021-08-03 (×3): qty 1

## 2021-08-03 MED ORDER — ASPIRIN 81 MG PO CHEW
81.0000 mg | CHEWABLE_TABLET | Freq: Every day | ORAL | Status: DC
  Administered 2021-08-04 – 2021-08-06 (×3): 81 mg via ORAL
  Filled 2021-08-03 (×3): qty 1

## 2021-08-03 MED ORDER — SODIUM CHLORIDE 0.9 % WEIGHT BASED INFUSION
3.0000 mL/kg/h | INTRAVENOUS | Status: DC
  Administered 2021-08-03: 3 mL/kg/h via INTRAVENOUS

## 2021-08-03 MED ORDER — CARVEDILOL 6.25 MG PO TABS
6.2500 mg | ORAL_TABLET | Freq: Two times a day (BID) | ORAL | Status: DC
  Administered 2021-08-03 – 2021-08-06 (×6): 6.25 mg via ORAL
  Filled 2021-08-03 (×6): qty 1

## 2021-08-03 MED ORDER — LIDOCAINE HCL (PF) 1 % IJ SOLN
INTRAMUSCULAR | Status: DC | PRN
  Administered 2021-08-03: 30 mL

## 2021-08-03 MED ORDER — MEXILETINE HCL 200 MG PO CAPS
200.0000 mg | ORAL_CAPSULE | Freq: Two times a day (BID) | ORAL | Status: DC
Start: 2021-08-03 — End: 2021-08-04
  Administered 2021-08-03 – 2021-08-04 (×2): 200 mg via ORAL
  Filled 2021-08-03 (×3): qty 1

## 2021-08-03 MED ORDER — SODIUM CHLORIDE 0.9% FLUSH
3.0000 mL | INTRAVENOUS | Status: DC | PRN
  Administered 2021-08-05 – 2021-08-06 (×2): 3 mL via INTRAVENOUS

## 2021-08-03 MED ORDER — FUROSEMIDE 10 MG/ML IJ SOLN
40.0000 mg | Freq: Once | INTRAMUSCULAR | Status: AC
  Administered 2021-08-03: 40 mg via INTRAVENOUS
  Filled 2021-08-03: qty 4

## 2021-08-03 MED ORDER — SODIUM CHLORIDE 0.9 % IV SOLN: INTRAVENOUS | Status: DC

## 2021-08-03 MED ORDER — FENTANYL CITRATE (PF) 100 MCG/2ML IJ SOLN
INTRAMUSCULAR | Status: DC | PRN
  Administered 2021-08-03: 25 ug via INTRAVENOUS

## 2021-08-03 MED ORDER — SODIUM CHLORIDE 0.9% FLUSH
3.0000 mL | Freq: Two times a day (BID) | INTRAVENOUS | Status: DC
  Administered 2021-08-03 – 2021-08-06 (×6): 3 mL via INTRAVENOUS

## 2021-08-03 MED ORDER — SODIUM CHLORIDE 0.9 % WEIGHT BASED INFUSION: 1.0000 mL/kg/h | INTRAVENOUS | Status: DC

## 2021-08-03 MED ORDER — HYDRALAZINE HCL 20 MG/ML IJ SOLN: 10.0000 mg | INTRAMUSCULAR | Status: AC | PRN

## 2021-08-03 MED ORDER — ASPIRIN 81 MG PO CHEW: 81.0000 mg | CHEWABLE_TABLET | ORAL | Status: DC

## 2021-08-03 MED ORDER — MIDAZOLAM HCL 2 MG/2ML IJ SOLN
INTRAMUSCULAR | Status: DC | PRN
  Administered 2021-08-03: 1 mg via INTRAVENOUS

## 2021-08-03 MED ORDER — SODIUM CHLORIDE 0.9% FLUSH: 3.0000 mL | INTRAVENOUS | Status: DC | PRN

## 2021-08-03 MED ORDER — MIDAZOLAM HCL 2 MG/2ML IJ SOLN
INTRAMUSCULAR | Status: AC
  Filled 2021-08-03: qty 2

## 2021-08-03 MED ORDER — LABETALOL HCL 5 MG/ML IV SOLN
10.0000 mg | INTRAVENOUS | Status: AC | PRN
  Administered 2021-08-03 (×2): 10 mg via INTRAVENOUS
  Filled 2021-08-03: qty 4

## 2021-08-03 MED ORDER — IOHEXOL 350 MG/ML SOLN
INTRAVENOUS | Status: DC | PRN
  Administered 2021-08-03: 80 mL

## 2021-08-03 MED ORDER — IRBESARTAN 75 MG PO TABS
75.0000 mg | ORAL_TABLET | Freq: Every day | ORAL | Status: DC
  Administered 2021-08-04: 10:00:00 75 mg via ORAL
  Filled 2021-08-03: qty 1

## 2021-08-03 MED ORDER — SODIUM CHLORIDE 0.9 % IV SOLN: 250.0000 mL | INTRAVENOUS | Status: DC | PRN

## 2021-08-03 MED ORDER — ACETAMINOPHEN 325 MG PO TABS: 650.0000 mg | ORAL_TABLET | ORAL | Status: DC | PRN

## 2021-08-03 MED ORDER — ESCITALOPRAM OXALATE 20 MG PO TABS
20.0000 mg | ORAL_TABLET | Freq: Every day | ORAL | Status: DC
  Administered 2021-08-04 – 2021-08-06 (×3): 20 mg via ORAL
  Filled 2021-08-03 (×3): qty 1

## 2021-08-03 SURGICAL SUPPLY — 13 items
CATH INFINITI 5FR MULTPACK ANG (CATHETERS) ×1 IMPLANT
CATH SWAN GANZ 7F STRAIGHT (CATHETERS) ×1 IMPLANT
CATH SWAN GANZ VIP 7.5F (CATHETERS) IMPLANT
CLOSURE MYNX CONTROL 5F (Vascular Products) ×1 IMPLANT
CLOSURE MYNX CONTROL 6F/7F (Vascular Products) ×1 IMPLANT
KIT HEART LEFT (KITS) ×2 IMPLANT
PACK CARDIAC CATHETERIZATION (CUSTOM PROCEDURE TRAY) ×2 IMPLANT
SHEATH PINNACLE 5F 10CM (SHEATH) ×1 IMPLANT
SHEATH PINNACLE 7F 10CM (SHEATH) ×1 IMPLANT
SYR MEDRAD MARK 7 150ML (SYRINGE) ×2 IMPLANT
TRANSDUCER W/STOPCOCK (MISCELLANEOUS) ×2 IMPLANT
TUBING CIL FLEX 10 FLL-RA (TUBING) ×2 IMPLANT
WIRE EMERALD 3MM-J .035X150CM (WIRE) ×1 IMPLANT

## 2021-08-03 NOTE — Interval H&P Note (Signed)
Cath Lab Visit (complete for each Cath Lab visit)  Clinical Evaluation Leading to the Procedure:   ACS: No.  Non-ACS:    Anginal Classification: CCS I  Anti-ischemic medical therapy: Maximal Therapy (2 or more classes of medications)  Non-Invasive Test Results: No non-invasive testing performed  Prior CABG: Previous CABG      History and Physical Interval Note:  08/03/2021 11:22 AM  Shane [name]  has presented today for surgery, with the diagnosis of Left ventricle dysfunction.  The various methods of treatment have been discussed with the patient and family. After consideration of risks, benefits and other options for treatment, the patient has consented to  Procedure(s): RIGHT/LEFT HEART CATH AND CORONARY ANGIOGRAPHY (N/A) as a surgical intervention.  The patient's history has been reviewed, patient examined, no change in status, stable for surgery.  I have reviewed the patient's chart and labs.  Questions were answered to the patient's satisfaction.     Quay Burow

## 2021-08-03 NOTE — Consult Note (Addendum)
Advanced Heart Failure Team Consult Note   Primary Physician: Leamon Arnt, MD PCP-Cardiologist:  Dr. Gwenlyn Found   Reason for Consultation: Acute Systolic Heart Failure  HPI:    Shane [name] is seen today for evaluation of acute systolic heart failure at the request of Dr. Gwenlyn Found, Cardiology.   56 y/o male w/ ischemic CM and CAD, Non Hogkins Lymphoma > 20 years ago, treated w/ chemo and radiation. 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 Echo at time of CABG showed moderately reduced LVEF, 30-35%. EF improved post revascularization and had normalized, back to 50-55%, on repeat echo in 2016. In 2017, he underwent cardiac w/u for decrease exercise tolerance. Echo w/ normal LVEF, 50-55%, no significant valvular diease. NST was normal.   He was a moderate drinker until recently, was drinking ~3 beers a day, but quit ~Nov of last year when he was found to have elevated LFTs as well as recent unintentional wt loss. There were initial concerns for primary biliary cirrhosis and he underwent evaluation at [facility].  He underwent liver biopsy. Per notes in Care Everywhere, Nonspecific liver biopsy finding of diffuse congestion and sinusoidal dilation at least raise the possibility of elevated right-sided heart pressures. Biopsy did not suggest significant fibrosis or biliary injury (PBC). He was referred back to cardiology for echo.   Had f/u w/ Dr. Gwenlyn Found 07/24/21 and endorsed new exertional dyspnea but no CP. Echo repeated, EF back down to 30-35%, RV mildly reduced, mild-mod MR. Mod TR. Referred for [facility] which was done today and showed occluded native RCA and RCA vein graft with left-to-right collaterals.  The remainder of his grafts are patent.  All his native arteries were occluded. RHC showed elevated right sided filling pressures and marginal CO (see hemodynamics below). Also noted to have infiltrates on fluoroscopy reminiscent of  "Gohn complexes".  1: Right atrial pressure-17/17 2: Right ventricular pressure-51/23, mean 27 3: Pulmonary artery pressure-57/9, mean 35 4: Pulmonary wedge pressure-A-wave 28, V wave 24, mean 17 5: Cardiac output-3.7 L/min with an index of 2 L/min/m by Fick 6: LVEDP-24  Of note, he had chest CT in 2015 at [facility], that showed calcified lymph nodes in the anterior mediastinum and pretracheal region c/w prior granulomatous disease. Also w/ several 1-2 mm nodular opacities noted. Per chart review, no f/u CT since then.  He Denies tobacco use but admits to occasional mariajuana use. Quit ETOH.   Denies fever, chills, night sweats, hemoptysis.   Frequent PVCs noted on tele. Asymptomatic.  He wore holter monitor 5/21 that showed 13% PVC burden.   No labs today. Last outpatient labs 1/13 showed normal AST and ALT. Tbili however elevated 1.7     2D Echo 07/2021  Left ventricular ejection fraction, by estimation, is 30 to 35%. The left ventricle has moderately decreased function. The left ventricle demonstrates global hypokinesis. Left ventricular diastolic parameters are indeterminate. 1. Right ventricular systolic function is mildly reduced. The right ventricular size is normal. 2. 3. Left atrial size was mildly dilated. 4. Right atrial size was mildly dilated. The mitral valve is normal in structure. Mild to moderate mitral valve regurgitation. No evidence of mitral stenosis. 5. 6. Tricuspid valve regurgitation is moderate. The aortic valve is normal in structure. Aortic valve regurgitation is not visualized. No aortic stenosis is present. 7. The inferior vena cava is normal in size with greater than 50% respiratory variability, suggesting right atrial  pressure of 3 mmHg.  RVSP: 30.7 mmHg  RHC 08/03/21  Hemodynamics:   1: Right atrial pressure-17/17 2: Right ventricular pressure-51/23, mean 27 3: Pulmonary artery pressure-57/9, mean 35 4: Pulmonary wedge pressure-A-wave 28, V  wave 24, mean 17 5: Cardiac output-3.7 L/min with an index of 2 L/min/m by Fick 6: LVEDP-24   Diagnostic Dominance: Right  Review of Systems: [y] = yes, [ ]  = no   General: Weight gain [ ] ; Weight loss [Y ]; Anorexia Y ]; Fatigue [ Y]; Fever [ ] ; Chills [ ] ; Weakness [ ]   Cardiac: Chest pain/pressure [ ] ; Resting SOB [ ] ; Exertional SOB [ Y]; Orthopnea [ ] ; Pedal Edema [ ] ; Palpitations [ ] ; Syncope [ ] ; Presyncope [ ] ; Paroxysmal nocturnal dyspnea[ ]   Pulmonary: Cough [ ] ; Wheezing[ ] ; Hemoptysis[ ] ; Sputum [ ] ; Snoring [ ]   GI: Vomiting[ ] ; Dysphagia[ ] ; Melena[ ] ; Hematochezia [ ] ; Heartburn[ ] ; Abdominal pain [ ] ; Constipation [ ] ; Diarrhea [ ] ; BRBPR [ ]   GU: Hematuria[ ] ; Dysuria [ ] ; Nocturia[ ]   Vascular: Pain in legs with walking [ ] ; Pain in feet with lying flat [ ] ; Non-healing sores [ ] ; Stroke [ ] ; TIA [ ] ; Slurred speech [ ] ;  Neuro: Headaches[ ] ; Vertigo[ ] ; Seizures[ ] ; Paresthesias[ ] ;Blurred vision [ ] ; Diplopia [ ] ; Vision changes [ ]   Ortho/Skin: Arthritis [ ] ; Joint pain [ ] ; Muscle pain [ ] ; Joint swelling [ ] ; Back Pain [ ] ; Rash [ ]   Psych: Depression[ ] ; Anxiety[ ]   Heme: Bleeding problems [ ] ; Clotting disorders [ ] ; Anemia [ ]   Endocrine: Diabetes [ ] ; Thyroid dysfunction[ ]   Home Medications Prior to Admission medications   Medication Sig Start Date End Date Taking? Authorizing Provider  ARIPiprazole (ABILIFY) 5 MG tablet Take 1 tablet (5 mg total) by mouth daily. 01/27/21  Yes Mozingo, Berdie Ogren, NP  aspirin 81 MG tablet Take 81 mg by mouth daily.   Yes [provider]  carvedilol (COREG) 6.25 MG tablet Take 1 tablet by mouth twice daily. 06/03/21  Yes Lorretta Harp, MD  escitalopram (LEXAPRO) 20 MG tablet Take 1 tablet (20 mg total) by mouth daily. 01/27/21  Yes Mozingo, Berdie Ogren, NP  LORazepam (ATIVAN) 0.5 MG tablet Take 1 tablet (0.5 mg total) by mouth 2 (two) times daily. Patient taking differently: Take 0.5 mg by mouth 2 (two)  times daily as needed (anxiety.). 05/05/21  Yes Mozingo, Berdie Ogren, NP  metFORMIN (GLUCOPHAGE-XR) 750 MG 24 hr tablet Take 1 tablet (750 mg total) by mouth daily with breakfast. 06/17/21  Yes Leamon Arnt, MD  Multiple Vitamins-Minerals (PRESERVISION AREDS 2 PO) Take 2 tablets by mouth in the morning.   Yes [provider]  nitroGLYCERIN (NITROSTAT) 0.4 MG SL tablet Place 1 tablet (0.4 mg total) under the tongue every 5 (five) minutes as needed for chest pain. 04/30/21  Yes Lorretta Harp, MD  olmesartan (BENICAR) 20 MG tablet Take 1 tablet (20 mg total) by mouth daily. 07/10/21  Yes Alvstad, Kristin L, RPH-CPP  rosuvastatin (CRESTOR) 20 MG tablet Take 1 tablet by mouth daily. **Please call to schedule office visit prior to further refills** 01/01/21  Yes Lorretta Harp, MD  ursodiol (ACTIGALL) 500 MG tablet Take 500 mg by mouth 2 (two) times daily. 06/09/21  Yes [provider]  BESIVANCE 0.6 % SUSP Place 1 drop into the left eye See admin instructions. Instill 1 drop into left eye 4 times daily the  day of and the day after eye injection as needed. 05/08/14   [provider]  Bevacizumab (AVASTIN IV) Inject 1 Dose into the eye every 6 (six) weeks.    [provider]    Past Medical History: Past Medical History:  Diagnosis Date   CAD (coronary artery disease), s/p stent 2011 and CABG 2015 [facility])    Hodgkin disease [facility]) 1997   Hyperlipidemia    Macular degeneration    PVC's (premature ventricular contractions)     Past Surgical History: Past Surgical History:  Procedure Laterality Date   CARDIAC CATHETERIZATION  08/2009   PCI -LAD 2.28mmx18mm Xience   CORONARY ARTERY BYPASS GRAFT  02/2014    Family History: Family History  Problem Relation Age of Onset   Stroke Mother    Cancer Mother    COPD Mother    Heart disease Father    COPD Father    Heart attack Father    Heart Problems Brother        heart valve replaced at  37 years old   Heart Problems Brother        MI at age 63; CABG at age 6   Cancer Maternal Grandmother    Colon cancer Neg Hx     Social History: Social History   Socioeconomic History   Marital status: Married    Spouse name: Not on file   Number of children: Not on file   Years of education: Not on file   Highest education level: Not on file  Occupational History   Not on file  Tobacco Use   Smoking status: Never   Smokeless tobacco: Former   Tobacco comments:    a long time ago  Media planner   Vaping Use: Never used  Substance and Sexual Activity   Alcohol use: Yes    Alcohol/week: 3.0 standard drinks    Types: 3 Cans of beer per week    Comment: sometimes 3 beers daily   Drug use: No   Sexual activity: Yes  Other Topics Concern   Not on file  Social History Narrative   Not on file   Social Determinants of Health   Financial Resource Strain: Not on file  Food Insecurity: Not on file  Transportation Needs: Not on file  Physical Activity: Not on file  Stress: Not on file  Social Connections: Not on file    Allergies:  No Known Allergies  Objective:    Vital Signs:   Temp:  [97.8 F (36.6 C)] 97.8 F (36.6 C) (01/23 0935) Pulse Rate:  [13-117] 76 (01/23 1340) Resp:  [10-69] 28 (01/23 1340) BP: (126-163)/(92-125) 163/125 (01/23 1340) SpO2:  [89 %-99 %] 94 % (01/23 1340) Weight:  [70.3 kg] 70.3 kg (01/23 0935)    Weight change: Filed Weights   08/03/21 0935  Weight: 70.3 kg    Intake/Output:  No intake or output data in the 24 hours ending 08/03/21 1416    Physical Exam    General: thin middle aged WM, laying flat post cath. No resp difficulty HEENT: normal Neck: supple. Prominent distended neck veins,  . Carotids 2+ bilat; no bruits. No lymphadenopathy or thyromegaly appreciated. Cor: PMI nondisplaced. Irregular rhythm, frequent PVCs. No rubs, gallops or murmurs. Lungs: clear Abdomen: soft, nontender, nondistended. No hepatosplenomegaly.  No bruits or masses. Good bowel sounds. Extremities: no cyanosis, clubbing, rash, edema Neuro: alert & orientedx3, cranial nerves grossly intact. moves all 4 extremities w/o difficulty. Affect pleasant  Telemetry   NSR w/ frequent PVCs   EKG    No new EKG from this admit, previous outpatient EKG 1/13 NSR 1st degree AVB, 65 bpm, QRS 128 ms   Labs   Basic Metabolic Panel: No results for input(s): NA, K, CL, CO2, GLUCOSE, BUN, CREATININE, CALCIUM, MG, PHOS in the last 168 hours.  Liver Function Tests: No results for input(s): AST, ALT, ALKPHOS, BILITOT, PROT, ALBUMIN in the last 168 hours. No results for input(s): LIPASE, AMYLASE in the last 168 hours. No results for input(s): AMMONIA in the last 168 hours.  CBC: No results for input(s): WBC, NEUTROABS, HGB, HCT, MCV, PLT in the last 168 hours.  Cardiac Enzymes: No results for input(s): CKTOTAL, CKMB, CKMBINDEX, TROPONINI in the last 168 hours.  BNP: BNP (last 3 results) No results for input(s): BNP in the last 8760 hours.  ProBNP (last 3 results) No results for input(s): PROBNP in the last 8760 hours.   CBG: Recent Labs  Lab 08/03/21 1247  GLUCAP 105*    Coagulation Studies: No results for input(s): LABPROT, INR in the last 72 hours.   Imaging   CARDIAC CATHETERIZATION  Result Date: 08/03/2021 Images from the original result were not included.   Prox RCA to Mid RCA lesion is 100% stenosed.   Dist LM lesion is 100% stenosed with 100% stenosed side branch in Ost Cx.   Origin to Prox Graft lesion is 100% stenosed.   There is severe left ventricular systolic dysfunction.   LV end diastolic pressure is moderately elevated.   The left ventricular ejection fraction is less than 25% by visual estimate. Shane [name] is a 56 y.o. male  [id number] LOCATION:  FACILITY: [facility] PHYSICIAN: Quay Burow, M.D. 30-Aug-1965 DATE OF PROCEDURE:  08/03/2021 DATE OF DISCHARGE: CARDIAC CATHETERIZATION History obtained from chart  review.Shane [name] is a 56 y.o.  thin appearing married Caucasian male father of 2 children [name] is also a patient of mine who accompanies him today.    I last saw him in the office  06/09/2021.  He works as an Sales promotion account executive. His primary care physician is Dr. Cecille Amsterdam. Marland Kitchen His cardiac risk factor profile is remarkable for hyperlipidemia and family history the father who had bypass surgery at age 14 and a brother who had bypass surgery as well. He suffered an anterior wall myocardial infarction 08/28/09 with a Xience DES stent placed in his proximal LAD. Because of recurrent symptoms he underwent catheterization in August and ultimately coronary bypass grafting 02/27/14 with a LIMA to his LAD, a vein to ramus branch, obtuse marginal branch and the RCA. His ejection fraction was 35%. He recuperated nicely although he did not participate in cardiac rehabilitation. A 2-D echocardiogram performed 08/27/14 revealed an improvement in his ejection fraction up to 50-55% with anterolateral wall motion abnormality. While honeymooning in [place] he noticed prolonged recovery from exercise activity as well as "just not feeling right". He denies chest pain. I performed a Myoview stress test on him after that on 06/28/16 which was low risk and nonischemic and 2-D echo that revealed preserved LV function with EF of 50-55%. Those symptoms have since subsided.  He has been diagnosed with primary biliary's cirrhosis and is undergoing evaluation at [facility].  He apparently had a recent liver biopsy.  He has lost 12 pounds since I saw him.  He complains of nausea and has been unable to eat a normal diet.  He was complaining of  fairly new onset dyspnea on exertion. Since I saw him 6 weeks ago I did get a 2D echocardiogram that shows a new decline in LV function to 30 to 35% with mild to moderate MR, moderate TR.  His last echo in our system 07/02/2016 revealed a normal EF and normal valvular  function. PROCEDURE DESCRIPTION: The patient was brought to the second floor [place] [place]iac cath lab in the postabsorptive state. He was premedicated with IV Versed and fentanyl. His right groin was prepped and shaved in usual sterile fashion. Xylocaine 1% was used for local anesthesia. A 5 French sheath was inserted into the right common femoral artery using standard Seldinger technique.  A 7 French sheath was inserted into the right common femoral vein.  A 7 [place] balloontipped demolition Swan-Ganz catheter was then advanced to the right heart chambers obtaining sequential pressures and pulmonary blood samples for the determination of Fick and thermal dilution cardiac output.  5 French right left Judkins diagnostic catheters were used for selective coronary angiography, vein graft and IMA graft angiography and left ventriculography.  Isovue dye is used for the entirety of the case (80 cc of contrast total to patient).  Retrograde aortic, ventricular and pullback pressures were recorded.  Hemodynamics: 1: Right atrial pressure-17/17 2: Right ventricular pressure-51/23, mean 27 3: Pulmonary artery pressure-57/9, mean 35 4: Pulmonary wedge pressure-A-wave 28, V wave 24, mean 17 5: Cardiac output-3.7 L/min with an index of 2 L/min/m by Fick 6: LVEDP-24   Mr.Pickeral has an occluded RCA and RCA vein graft with left-to-right collaterals.  The remainder of his grafts are patent.  All his native arteries were occluded.  He does have high filling pressures and EF in the 25% range.  I think that his dyspnea most likely is multifactorial from ischemic cardiomyopathy plus or minus a pulmonary process.  He does have infiltrates on fluoroscopy reminiscent of "Gohn complexes".  A right femoral angiogram was performed and a Mynx closure device was successfully deployed in the right common femoral artery and vein achieving hemostasis.  The patient left lab in stable condition.  He will be admitted on the heart failure  service for optimization of his pharmacology as well as a pulmonary consult.  He left the lab in stable condition. Quay Burow. MD, [facility] 08/03/2021 12:38 PM      Medications:     Current Medications:  [START ON 08/04/2021] aspirin  81 mg Oral Pre-Cath   carvedilol  6.25 mg Oral BID WC   escitalopram  20 mg Oral Daily   irbesartan  75 mg Oral Daily   rosuvastatin  20 mg Oral Daily    Infusions:  sodium chloride     sodium chloride 75 mL/hr at 08/03/21 1244   sodium chloride 1 mL/kg/hr (08/03/21 1114)      Patient Profile   56 y/o male w/ CAD and prior ICM w/ improved EF post CABG, now w/ new drop in EF, 30-35%, mildly reduced RV and new exertional dyspnea/fatigue. Presented for [facility] w/ 3/4 patent grafts and elevated rt sided filling pressures. Admitted from cath lab for further management.   Assessment/Plan   Acute Systolic Heart Failure  - prior h/o ischemic CM, that improved post revascularization - EF 30-35% at time of CABG in 2015, EF normalized on repeat Echos in 2016 and 2017 (50-55%) - EF back down, 30-35%, RV mildly reduced. LHC w/ occluded SVG-RCA but distal RCA filled by L>R collaterals. All other grafts patent. EF out of proportion to degree  of CAD. RHC w/ elevated Rt sided filling pressures w/ fairly normal PCWP ~17. CO marginal, CI 2.0.  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  - ? Sarcoid given prior abnormal Chest CT w/ calcified mediastinal lymphadenopathy c/w prior granulomatous disease (may also be 2/2 NHL).  - Plan cMRI to r/o infiltrative/inflammatory CM. May need LifeVest at D/c - IV Lasix 40 mg x 1, likely PO torsemide tomorrow  - Will titrate GDMT - On irbesartan>>Eventual Entresto  - SGLTi +/- spiro pending labs - Coreg 6.25 mg bid (home dose, avoid up titration w/ marginal output)  - some dyssynchrony/ paradoxical septal motion noted on echo but QRS <150 ms. Not candidate for CRT-D     2. PVCs - frequent on tele, Holter  5/21 showed 13% burden  - suspect contributing to cardiomyopathy  - no coronary ischemia on cath  - add mexiletine 200 mg bid for suppression  - keep K > 4.0 and Mg > 2.0 - Has OSA but admits to poor compliance w/ CPAP. Encouraged nightly use   3. CAD - remote MI 2011, s/p PCI to pLAD - CABGx 4 in 2015 - LHC this admit w/ 3/4 patent grafts, occluded SVG- RCA w/ L>R collaterals.  - No CP  - ASA 81 mg - Coreg 6.25 mg bid (home dose, avoid up titration w/ marginal output)  - Crestor 20 mg   4. Hypertension  - BP elevated post cath, did not take AM meds - resume home regimen - Plan GDMT titration   5. OSA - reports poor compliance - encouraged nightly use    Length of Stay: 0  Brittainy Simmons, PA-C  08/03/2021, 2:16 PM  Advanced Heart Failure Team Pager [phone] (M-F; 7a - 5p)  Please contact [facility] for night-coverage after hours (4p -7a ) and weekends on [email or web address]   Patient seen and examined with the above-signed Advanced Practice Provider and/or Housestaff. I personally reviewed laboratory data, imaging studies and relevant notes. I independently examined the patient and formulated the important aspects of the plan. I have edited the note to reflect any of my changes or salient points. I have personally discussed the plan with the patient and/or family.  56 y/o male with h/o Hodgkin's Lymphoma s/p chemo/XRT (25 years ago), CAD s/p CABG, OSA intolerant of CPAP, frequent PVCs.  Recently underwent w/u for possible biliary cirrhosis but biopsy negative. Has been more SOB and had some edema.   Echo with drop in EF 50-55% -> 30-35%. Recent zio with 13% PVCs.   Underwent R/L cath today with SVG -> RCA down but all other grafts patent and L-> R collats to native RCA. RHC with RV failure and low output  General:  Siting up in bed  No resp difficulty HEENT: normal Neck: supple. JVP to jaw . Carotids 2+ bilat; no bruits. No lymphadenopathy or thryomegaly  appreciated. Cor: PMI nondisplaced. Regular rate & rhythm. Frequent ectopy 2/6 TR Lungs: clear Abdomen: soft, nontender, nondistended. No hepatosplenomegaly. No bruits or masses. Good bowel sounds. Extremities: no cyanosis, clubbing, rash, tr edema Neuro: alert & orientedx3, cranial nerves grossly intact. moves all 4 extremities w/o difficulty. Affect pleasant  He has evidence of biventricular HF R>>L due likely to severe OSA leading to PVCs and PVC-mediated cardiomyopathy. Also has h/o Hodgkins lymphoma with remote chemo (likely including adriamycin) which could also be playing a role.   Will start IV diuresis. Add mexilitene to suppress PVCs. Check cMRI to exclude infiltrative CM.  Will need referral to ENT for non-CPAP therapy for OSA.  Glori Bickers, MD  11:34 PM

## 2021-08-04 ENCOUNTER — Other Ambulatory Visit: Payer: Self-pay

## 2021-08-04 ENCOUNTER — Encounter (HOSPITAL_COMMUNITY): Payer: Self-pay | Admitting: Cardiovascular Disease

## 2021-08-04 ENCOUNTER — Other Ambulatory Visit (HOSPITAL_COMMUNITY): Payer: Self-pay

## 2021-08-04 ENCOUNTER — Observation Stay (HOSPITAL_COMMUNITY): Payer: BC Managed Care – PPO

## 2021-08-04 DIAGNOSIS — H3530 Unspecified macular degeneration: Secondary | ICD-10-CM | POA: Diagnosis present

## 2021-08-04 DIAGNOSIS — R110 Nausea: Secondary | ICD-10-CM | POA: Diagnosis present

## 2021-08-04 DIAGNOSIS — Z7982 Long term (current) use of aspirin: Secondary | ICD-10-CM | POA: Diagnosis not present

## 2021-08-04 DIAGNOSIS — I081 Rheumatic disorders of both mitral and tricuspid valves: Secondary | ICD-10-CM | POA: Diagnosis present

## 2021-08-04 DIAGNOSIS — Z7984 Long term (current) use of oral hypoglycemic drugs: Secondary | ICD-10-CM | POA: Diagnosis not present

## 2021-08-04 DIAGNOSIS — Z923 Personal history of irradiation: Secondary | ICD-10-CM | POA: Diagnosis not present

## 2021-08-04 DIAGNOSIS — I110 Hypertensive heart disease with heart failure: Secondary | ICD-10-CM | POA: Diagnosis present

## 2021-08-04 DIAGNOSIS — Z8572 Personal history of non-Hodgkin lymphomas: Secondary | ICD-10-CM | POA: Diagnosis not present

## 2021-08-04 DIAGNOSIS — Z8249 Family history of ischemic heart disease and other diseases of the circulatory system: Secondary | ICD-10-CM | POA: Diagnosis not present

## 2021-08-04 DIAGNOSIS — Z955 Presence of coronary angioplasty implant and graft: Secondary | ICD-10-CM | POA: Diagnosis not present

## 2021-08-04 DIAGNOSIS — I3139 Other pericardial effusion (noninflammatory): Secondary | ICD-10-CM | POA: Diagnosis present

## 2021-08-04 DIAGNOSIS — G4733 Obstructive sleep apnea (adult) (pediatric): Secondary | ICD-10-CM | POA: Diagnosis present

## 2021-08-04 DIAGNOSIS — I519 Heart disease, unspecified: Secondary | ICD-10-CM | POA: Diagnosis not present

## 2021-08-04 DIAGNOSIS — Z9221 Personal history of antineoplastic chemotherapy: Secondary | ICD-10-CM | POA: Diagnosis not present

## 2021-08-04 DIAGNOSIS — I5023 Acute on chronic systolic (congestive) heart failure: Secondary | ICD-10-CM

## 2021-08-04 DIAGNOSIS — Z809 Family history of malignant neoplasm, unspecified: Secondary | ICD-10-CM | POA: Diagnosis not present

## 2021-08-04 DIAGNOSIS — I5021 Acute systolic (congestive) heart failure: Secondary | ICD-10-CM | POA: Diagnosis not present

## 2021-08-04 DIAGNOSIS — I25810 Atherosclerosis of coronary artery bypass graft(s) without angina pectoris: Secondary | ICD-10-CM | POA: Diagnosis present

## 2021-08-04 DIAGNOSIS — E785 Hyperlipidemia, unspecified: Secondary | ICD-10-CM | POA: Diagnosis present

## 2021-08-04 DIAGNOSIS — I5082 Biventricular heart failure: Secondary | ICD-10-CM | POA: Diagnosis present

## 2021-08-04 DIAGNOSIS — I255 Ischemic cardiomyopathy: Secondary | ICD-10-CM | POA: Diagnosis present

## 2021-08-04 DIAGNOSIS — I252 Old myocardial infarction: Secondary | ICD-10-CM | POA: Diagnosis not present

## 2021-08-04 DIAGNOSIS — I493 Ventricular premature depolarization: Secondary | ICD-10-CM | POA: Diagnosis present

## 2021-08-04 DIAGNOSIS — Z825 Family history of asthma and other chronic lower respiratory diseases: Secondary | ICD-10-CM | POA: Diagnosis not present

## 2021-08-04 DIAGNOSIS — Z823 Family history of stroke: Secondary | ICD-10-CM | POA: Diagnosis not present

## 2021-08-04 DIAGNOSIS — Z79899 Other long term (current) drug therapy: Secondary | ICD-10-CM | POA: Diagnosis not present

## 2021-08-04 DIAGNOSIS — Z8571 Personal history of Hodgkin lymphoma: Secondary | ICD-10-CM | POA: Diagnosis not present

## 2021-08-04 LAB — CBC
HCT: 45.2 % (ref 39.0–52.0)
HCT: 42.7 % (ref 39.0–52.0)
Hemoglobin: 14.9 g/dL (ref 13.0–17.0)
Hemoglobin: 14 g/dL (ref 13.0–17.0)
MCH: 31.2 pg (ref 26.0–34.0)
MCH: 30.1 pg (ref 26.0–34.0)
MCHC: 33 g/dL (ref 30.0–36.0)
MCHC: 32.8 g/dL (ref 30.0–36.0)
MCV: 94.6 fL (ref 80.0–100.0)
MCV: 91.8 fL (ref 80.0–100.0)
Platelets: 242 10*3/uL (ref 150–400)
Platelets: 255 10*3/uL (ref 150–400)
RBC: 4.78 MIL/uL (ref 4.22–5.81)
RBC: 4.65 MIL/uL (ref 4.22–5.81)
RDW: 16.4 % — ABNORMAL HIGH (ref 11.5–15.5)
RDW: 16.3 % — ABNORMAL HIGH (ref 11.5–15.5)
WBC: 7.6 10*3/uL (ref 4.0–10.5)
WBC: 6.5 10*3/uL (ref 4.0–10.5)
nRBC: 0 % (ref 0.0–0.2)
nRBC: 0 % (ref 0.0–0.2)

## 2021-08-04 LAB — MAGNESIUM: Magnesium: 1.7 mg/dL (ref 1.7–2.4)

## 2021-08-04 LAB — BASIC METABOLIC PANEL
Anion gap: 11 (ref 5–15)
BUN: 12 mg/dL (ref 6–20)
CO2: 23 mmol/L (ref 22–32)
Calcium: 8.6 mg/dL — ABNORMAL LOW (ref 8.9–10.3)
Chloride: 102 mmol/L (ref 98–111)
Creatinine, Ser: 1.06 mg/dL (ref 0.61–1.24)
GFR, Estimated: >60 mL/min (ref >60)
Glucose, Bld: 86 mg/dL (ref 70–99)
Potassium: 3.6 mmol/L (ref 3.5–5.1)
Sodium: 136 mmol/L (ref 135–145)

## 2021-08-04 MED ORDER — SACUBITRIL-VALSARTAN 49-51 MG PO TABS
1.0000 | ORAL_TABLET | Freq: Two times a day (BID) | ORAL | Status: DC
  Administered 2021-08-04 – 2021-08-06 (×4): 1 via ORAL
  Filled 2021-08-04 (×5): qty 1

## 2021-08-04 MED ORDER — FUROSEMIDE 10 MG/ML IJ SOLN
40.0000 mg | Freq: Two times a day (BID) | INTRAMUSCULAR | Status: DC
  Administered 2021-08-04 – 2021-08-05 (×2): 40 mg via INTRAVENOUS
  Filled 2021-08-04 (×2): qty 4

## 2021-08-04 MED ORDER — PNEUMOCOCCAL VAC POLYVALENT 25 MCG/0.5ML IJ INJ
0.5000 mL | INJECTION | INTRAMUSCULAR | Status: DC
  Filled 2021-08-04 (×2): qty 0.5

## 2021-08-04 MED ORDER — GADOBUTROL 1 MMOL/ML IV SOLN
12.0000 mL | Freq: Once | INTRAVENOUS | Status: AC | PRN
  Administered 2021-08-04: 09:00:00 12 mL via INTRAVENOUS

## 2021-08-04 MED ORDER — MAGNESIUM SULFATE 4 GM/100ML IV SOLN
4.0000 g | Freq: Once | INTRAVENOUS | Status: AC
  Administered 2021-08-04: 14:00:00 4 g via INTRAVENOUS
  Filled 2021-08-04: qty 100

## 2021-08-04 MED ORDER — MEXILETINE HCL 150 MG PO CAPS
300.0000 mg | ORAL_CAPSULE | Freq: Two times a day (BID) | ORAL | Status: DC
  Administered 2021-08-04 – 2021-08-06 (×4): 300 mg via ORAL
  Filled 2021-08-04 (×4): qty 2

## 2021-08-04 MED ORDER — POTASSIUM CHLORIDE CRYS ER 20 MEQ PO TBCR
40.0000 meq | EXTENDED_RELEASE_TABLET | Freq: Once | ORAL | Status: AC
  Administered 2021-08-04: 14:00:00 40 meq via ORAL
  Filled 2021-08-04: qty 2

## 2021-08-04 MED ORDER — SPIRONOLACTONE 12.5 MG HALF TABLET
12.5000 mg | ORAL_TABLET | Freq: Every day | ORAL | Status: DC
Start: 2021-08-04 — End: 2021-08-06
  Administered 2021-08-04 – 2021-08-06 (×3): 12.5 mg via ORAL
  Filled 2021-08-04 (×3): qty 1

## 2021-08-04 NOTE — Plan of Care (Signed)
Problem: Health Behavior/Discharge Planning: Goal: Ability to manage health-related needs will improve Outcome: Progressing   Problem: Nutrition: Goal: Adequate nutrition will be maintained Outcome: Progressing   Problem: Education: Goal: Knowledge of General Education information will improve Description: Including pain rating scale, medication(s)/side effects and non-pharmacologic comfort measures Outcome: Completed/Met   Problem: Elimination: Goal: Will not experience complications related to urinary retention Outcome: Completed/Met   Problem: Cardiovascular: Goal: Vascular access site(s) Level 0-1 will be maintained Outcome: Completed/Met   Problem: Health Behavior/Discharge Planning: Goal: Ability to manage health-related needs will improve Outcome: Progressing   Problem: Nutrition: Goal: Adequate nutrition will be maintained Outcome: Progressing

## 2021-08-04 NOTE — Progress Notes (Signed)
Patient returned from MRI at 0905hrs.

## 2021-08-04 NOTE — TOC CM/SW Note (Signed)
.. °  Transition of Care [facility]) Screening Note   Patient Details  Name: SEMIR BRILL Date of Birth: 1966-01-17   Transition of [facility]) CM/SW Contact:    Erenest Rasher, RN Phone Number: 08/04/2021, 5:15 PM    Transition of Care Department [facility]) has reviewed patient and no TOC needs have been identified at this time. We will continue to monitor patient advancement through interdisciplinary progression rounds. If new patient transition needs arise, please place a TOC consult. HF TOC CM/CSW will continue to follow for medication copay cards for Lovie Macadamia, and Entresto.

## 2021-08-04 NOTE — Progress Notes (Signed)
Patient taken to cardiac MRI at 0735hrs via WC.

## 2021-08-04 NOTE — Progress Notes (Addendum)
Advanced Heart Failure Rounding Note  PCP-Cardiologist: None   Subjective:    Good response to IV Lasix, 2.3L in UOP. Wt down 6 lb.  SCr 1.06.  K 3.7 Mg 1.7. Supp ordered   Mexilitine added for PVCs. Continues w/ frequent PVCs, runs of bigeminy. ~25/min   cMRI w/ multifocal LGE involving the septum and RV free wall, concerning for possible sarcoid vs multi-vessel coronary disease pattern.  LVEF 29% RVEF 41% Mod TR Large posterior pericardial effusion w/o tamponade   He feels better today. Breathing much improved. Denies CP. Asymptomatic w/ PVCs.   Side note: he reports h/o histoplasmosis w/ occular involvement. Has macular degeneration    Objective:   Weight Range: 67.6 kg Body mass index is 22.99 kg/m.   Vital Signs:   Temp:  [97.3 F (36.3 C)-98 F (36.7 C)] 97.9 F (36.6 C) (01/24 1200) Pulse Rate:  [66-88] 77 (01/24 1200) Resp:  [12-28] 12 (01/24 1200) BP: (128-169)/(77-125) 147/83 (01/24 1200) SpO2:  [91 %-99 %] 99 % (01/24 1200) Weight:  [67.6 kg] 67.6 kg (01/24 1200) Last BM Date: 08/02/21  Weight change: Filed Weights   08/03/21 0935 08/04/21 1200  Weight: 70.3 kg 67.6 kg    Intake/Output:   Intake/Output Summary (Last 24 hours) at 08/04/2021 1301 Last data filed at 08/04/2021 1216 Gross per 24 hour  Intake --  Output 2550 ml  Net -2550 ml      Physical Exam    General:  Well appearing. No resp difficulty HEENT: Normal Neck: Supple. JVP 6 cm . Carotids 2+ bilat; no bruits. No lymphadenopathy or thyromegaly appreciated. Cor: PMI nondisplaced. Irregularly irregular rhythm (PVCs). No rubs, gallops or murmurs. Lungs: Clear Abdomen: Soft, nontender, nondistended. No hepatosplenomegaly. No bruits or masses. Good bowel sounds. Extremities: No cyanosis, clubbing, rash, edema Neuro: Alert & orientedx3, cranial nerves grossly intact. moves all 4 extremities w/o difficulty. Affect pleasant   Telemetry   NSR w/ frequent PVCs and runs of bigeminy  ~25 PVCs/min   EKG    No new EKG to review   Labs    CBC Recent Labs    08/03/21 1758 08/04/21 0247  WBC 7.6 6.5  HGB 14.9 14.0  HCT 45.2 42.7  MCV 94.6 91.8  PLT 242 712   Basic Metabolic Panel Recent Labs    08/03/21 1202 08/04/21 0247  NA 140 136  K 3.7 3.6  CL  --  102  CO2  --  23  GLUCOSE  --  86  BUN  --  12  CREATININE  --  1.06  CALCIUM  --  8.6*  MG  --  1.7   Liver Function Tests No results for input(s): AST, ALT, ALKPHOS, BILITOT, PROT, ALBUMIN in the last 72 hours. No results for input(s): LIPASE, AMYLASE in the last 72 hours. Cardiac Enzymes No results for input(s): CKTOTAL, CKMB, CKMBINDEX, TROPONINI in the last 72 hours.  BNP: BNP (last 3 results) No results for input(s): BNP in the last 8760 hours.  ProBNP (last 3 results) No results for input(s): PROBNP in the last 8760 hours.   D-Dimer No results for input(s): DDIMER in the last 72 hours. Hemoglobin A1C No results for input(s): HGBA1C in the last 72 hours. Fasting Lipid Panel No results for input(s): CHOL, HDL, LDLCALC, TRIG, CHOLHDL, LDLDIRECT in the last 72 hours. Thyroid Function Tests No results for input(s): TSH, T4TOTAL, T3FREE, THYROIDAB in the last 72 hours.  Invalid input(s): FREET3  Other results:   Imaging  MR CARDIAC MORPHOLOGY W WO CONTRAST  Result Date: 08/04/2021 CLINICAL DATA:  Clinical question of Cardiomyopathy Study assumes BSA of 1.83 m2. EXAM: CARDIAC MRI TECHNIQUE: The patient was scanned on a 1.5 Tesla GE magnet. A dedicated cardiac coil was used. Functional imaging was done using Fiesta sequences. 2,3, and 4 chamber views were done to assess for RWMA's. Modified Simpson's rule using a short axis stack was used to calculate an ejection fraction on a dedicated work Conservation officer, nature. The patient received 12 cc of Gadavist. After 10 minutes inversion recovery sequences were used to assess for infiltration and scar tissue. CONTRAST:  12 cc  of  Gadavist FINDINGS: 1. Mildly dilated left ventricular size, with LVEDD 62 mm, and LVEDVi 104 mL/m2. Normal left ventricular thickness, with intraventricular septal thickness of 8 mm, posterior wall thickness of 7 mm, and myocardial mass index of 56 g/m2. Severely reduced left ventricular systolic function (LVEF =16%). There is basal inferior and inferolateral hypokinesis. There is apical septal hypokinesis. Left ventricular parametric mapping notable for significantly elevated native T1 signal (1232 ms) and elevated T2 signal (75 ms) in the apical septum. There is late gadolinium enhancement in the left ventricular myocardium: There is basal inferolateral LGE with wall thinning, there is mid-apical septal LGE that is endocardial in nature. 2. Normal right ventricular size with RVEDVI 81 mL/m2. Normal right ventricular thickness. Mildly reduced right ventricular systolic function (RVEF =10%). There are no regional wall motion abnormalities or aneurysms. There is basal anterior free wall RV late gadolinium enhancement. 3.  Normal left and right atrial size by indexed volumes. 4. Normal size of the aortic root, ascending aorta and pulmonary artery. 5. Valve assessment: Aortic Valve: Qualitatively no significant regurgitation. Pulmonic Valve: Qualitatively no significant regurgitation. Tricuspid Valve: There appears to be at least moderate tricuspid regurgitation- there is tricuspid regurgitation which refluxes into the IVC. Mitral Valve: Qualitatively mild regurgitation. 6. There is a large pericardial effusion posterior to the left and right ventricles. There is mild IVC dilation (22 mm) without evidence of RV or RA collapse. 7. Grossly, no extracardiac findings aside from sternotomy consistent with prior CABG and bilateral pleural effusions. Recommended dedicated study if concerned for non-cardiac pathology. 8. There is breathhold artifact noted most prominently during short axis stack LV and RV function imaging. 9.   Frequent PVCs noted throughout study. IMPRESSION: Decreased LV function. There is late gadolinium enhancement that is multifocal, involves the septum, and involves the RV free wall. Though some of these findings can be seen in multi-vessel coronary disease, they are also seen in cardiac sarcoidosis. Consider PET imaging if clinically indicated. Rudean Haskell MD Electronically Signed   By: Rudean Haskell M.D.   On: 08/04/2021 11:48     Medications:     Scheduled Medications:  aspirin  81 mg Oral Daily   carvedilol  6.25 mg Oral BID WC   escitalopram  20 mg Oral Daily   irbesartan  75 mg Oral Daily   mexiletine  200 mg Oral Q12H   potassium chloride  40 mEq Oral Once   rosuvastatin  20 mg Oral Daily   sodium chloride flush  3 mL Intravenous Q12H    Infusions:  sodium chloride     magnesium sulfate bolus IVPB      PRN Medications: sodium chloride, acetaminophen, morphine injection, ondansetron (ZOFRAN) IV, sodium chloride flush    Patient Profile   56 y/o male w/ CAD and prior ICM w/ improved EF post CABG, now  w/ new drop in EF, 30-35%, mildly reduced RV and new exertional dyspnea/fatigue. Presented for [facility] w/ 3/4 patent grafts and elevated rt sided filling pressures. Admitted from cath lab for further management.   Assessment/Plan   Acute Systolic Heart Failure  - prior h/o ischemic CM, that improved post revascularization - EF 30-35% at time of CABG in 2015, EF normalized on repeat Echos in 2016 and 2017 (50-55%) - EF back down, 30-35%, RV mildly reduced. LHC w/ occluded SVG-RCA but distal RCA filled by L>R collaterals. All other grafts patent. EF out of proportion to degree of CAD. RHC w/ elevated Rt sided filling pressures w/ fairly normal PCWP ~17. CO marginal, CI 2.0.  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  - ? Sarcoid given prior abnormal Chest CT w/ calcified mediastinal lymphadenopathy c/w prior granulomatous disease (may also be  2/2 NHL).  - cMRI today w/ multifocal LGE concerning for possible sarcoid. May need PET scan at [facility] and [facility] at d/c  - Volume much improve w/ Lasix, euvolemic today  - Will titrate GDMT - On irbesartan>> Switch to Entresto  - Add Spiro 12.5 mg daily  - SGLT2 in soon  - Coreg 6.25 mg bid (home dose, avoid up titration w/ marginal output)  - some dyssynchrony/ paradoxical septal motion noted on echo but QRS <150 ms. Not candidate for CRT-D       2. PVCs - frequent on tele, Holter 5/21 showed 13% burden  - suspect contributing to cardiomyopathy  - no coronary ischemia on cath  - mexiletine 200 mg bid added for suppression. May need titration to 250 bid - Supp K (3.7)  and Mg (1.7) - keep K > 4.0 and Mg > 2.0 - Has OSA but admits to poor compliance w/ CPAP. Encouraged nightly use    3. CAD - remote MI 2011, s/p PCI to pLAD - CABGx 4 in 2015 - LHC this admit w/ 3/4 patent grafts, occluded SVG- RCA w/ L>R collaterals.  - No CP  - ASA 81 mg - Coreg 6.25 mg bid (home dose, avoid up titration w/ marginal output)  - Crestor 20 mg   4. Pericardial effusion - large posterior effusion on MRI without tamponade.  - will review with imaging team. May need window   5. Hypertension  - moderately elevated  - continue home regimen - Plan GDMT titration    6. OSA - reports poor compliance - encouraged nightly use   7. Hypokalemia/ Hypomagnesemia - K 3.7, Mg 1.7  - Supp K and Mg (replacement ordered)  - Add Spiro 12.5 mg daily     Length of Stay: 0  Brittainy Simmons, PA-C  08/04/2021, 1:01 PM  Advanced Heart Failure Team Pager [phone] (M-F; 7a - 5p)  Please contact [facility] for night-coverage after hours (5p -7a ) and weekends on [email or web address]  Patient seen and examined with the above-signed Advanced Practice Provider and/or Housestaff. I personally reviewed laboratory data, imaging studies and relevant notes. I independently examined the patient and formulated the  important aspects of the plan. I have edited the note to reflect any of my changes or salient points. I have personally discussed the plan with the patient and/or family.  Diuresing well on INV lasix. Breathing better.   cMRI EF 29% RVEF 41%  +LGE Large posterior effusion   Still with frequent PVCs.   General:  Well appearing. No resp difficulty HEENT: normal Neck: supple. JVP 10. Carotids 2+ bilat; no  bruits. No lymphadenopathy or thryomegaly appreciated. Cor: PMI nondisplaced. Irregular rate & rhythm. No rubs, gallops or murmurs. Lungs: clear Abdomen: soft, nontender, nondistended. No hepatosplenomegaly. No bruits or masses. Good bowel sounds. Extremities: no cyanosis, clubbing, rash, edema Neuro: alert & orientedx3, cranial nerves grossly intact. moves all 4 extremities w/o difficulty. Affect pleasant   Volume status improving. Would continue diuresis. Still with extensive PVC burden. Increase mexilitene to 300 bid. Supp mag. Add spiro and Entresto.   Will review cMRI with imaging team. Clinical picture not highly suspicious for sarcoid but not impossible.Check ACE level. May need pericardial window.   Glori Bickers, MD  2:22 PM

## 2021-08-04 NOTE — Progress Notes (Signed)
Mobility Specialist Progress Note    08/04/21 1258  Mobility  Activity Ambulated independently in hallway  Level of Assistance Standby assist, set-up cues, supervision of patient - no hands on  Assistive Device None  Distance Ambulated (ft) 480 ft  Activity Response Tolerated fair  $Mobility charge 1 Mobility   Pre-Mobility: 74 HR, 111/83 BP During Mobility: 106 HR Post-Mobility: 92 HR, 145/92 BP  Pt received in bed and agreeable. C/o feeling a little winded during walk but maintained conversation. Returned to bed with call bell in reach and wife present.   [facility] Mobility Specialist  M.S. 2C and 6E: [phone] M.S. 4E: [phone]

## 2021-08-04 NOTE — TOC Benefit Eligibility Note (Signed)
Patient Teacher, English as a foreign language completed.    The patient is currently admitted and upon discharge could be taking Entresto 24-26 mg.  The current 30 day co-pay is, $40.00.   The patient is currently admitted and upon discharge could be taking Jardiance 10 mg.  The current 30 day co-pay is, $40.00.   The patient is currently admitted and upon discharge could be taking Farxiga 10 mg.  The current 30 day co-pay is, $40.00.   The patient is insured through [employer] [place] Patient Advocate Specialist [place] Patient Advocate Team Direct Number: [phone]  Fax: [phone]

## 2021-08-05 ENCOUNTER — Encounter: Payer: Self-pay | Admitting: Cardiovascular Disease

## 2021-08-05 ENCOUNTER — Inpatient Hospital Stay (HOSPITAL_COMMUNITY): Payer: BC Managed Care – PPO

## 2021-08-05 DIAGNOSIS — I5023 Acute on chronic systolic (congestive) heart failure: Secondary | ICD-10-CM | POA: Diagnosis not present

## 2021-08-05 DIAGNOSIS — I3139 Other pericardial effusion (noninflammatory): Secondary | ICD-10-CM | POA: Diagnosis not present

## 2021-08-05 LAB — BASIC METABOLIC PANEL
Anion gap: 9 (ref 5–15)
BUN: 14 mg/dL (ref 6–20)
CO2: 27 mmol/L (ref 22–32)
Calcium: 8.7 mg/dL — ABNORMAL LOW (ref 8.9–10.3)
Chloride: 101 mmol/L (ref 98–111)
Creatinine, Ser: 0.93 mg/dL (ref 0.61–1.24)
GFR, Estimated: >60 mL/min (ref >60)
Glucose, Bld: 128 mg/dL — ABNORMAL HIGH (ref 70–99)
Potassium: 3.8 mmol/L (ref 3.5–5.1)
Sodium: 137 mmol/L (ref 135–145)

## 2021-08-05 LAB — ECHOCARDIOGRAM LIMITED
Height: 67.5 in
S' Lateral: 4.4 cm
Weight: 2384 oz

## 2021-08-05 LAB — MAGNESIUM: Magnesium: 1.9 mg/dL (ref 1.7–2.4)

## 2021-08-05 MED ORDER — POTASSIUM CHLORIDE CRYS ER 20 MEQ PO TBCR
40.0000 meq | EXTENDED_RELEASE_TABLET | Freq: Once | ORAL | Status: AC
  Administered 2021-08-05: 13:00:00 40 meq via ORAL
  Filled 2021-08-05: qty 2

## 2021-08-05 MED ORDER — MAGNESIUM SULFATE 2 GM/50ML IV SOLN
2.0000 g | Freq: Once | INTRAVENOUS | Status: AC
  Administered 2021-08-05: 13:00:00 2 g via INTRAVENOUS
  Filled 2021-08-05: qty 50

## 2021-08-05 MED ORDER — FUROSEMIDE 10 MG/ML IJ SOLN
40.0000 mg | Freq: Two times a day (BID) | INTRAMUSCULAR | Status: DC
Start: 2021-08-05 — End: 2021-08-06
  Administered 2021-08-05 – 2021-08-06 (×2): 40 mg via INTRAVENOUS
  Filled 2021-08-05 (×2): qty 4

## 2021-08-05 NOTE — Discharge Summary (Addendum)
Advanced Heart Failure Team  Discharge Summary   Patient ID: Shane [name] MRN: [id number], DOB/AGE: [birth year] 56 y.o. 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 Echo at time of CABG showed moderately reduced LVEF, 30-35%. EF improved post revascularization and had normalized, back to 50-55%, on repeat echo in 2016. In 2017, he underwent cardiac w/u for decrease exercise tolerance. Echo w/ normal LVEF, 50-55%, no significant valvular diease. NST was normal.    He was a moderate drinker until recently, was drinking ~3 beers a day, but quit ~Nov of last year when he was found to have elevated LFTs as well as recent unintentional wt loss. There were initial concerns for primary biliary cirrhosis and he underwent evaluation at [facility].  He underwent liver biopsy. Per notes in Care Everywhere, Nonspecific liver biopsy finding of diffuse congestion and sinusoidal dilation at raised the possibility of elevated right-sided heart pressures. Biopsy did not suggest significant fibrosis or biliary injury (PBC). He was referred back to cardiology for echo.    Had f/u w/ Dr. Gwenlyn Found 07/24/21 and endorsed new exertional dyspnea but no CP. Echo repeated, EF back down to 30-35%, RV mildly reduced, mild-mod MR. Mod TR.   He was referred for [facility] on 1/23 which showed occluded native RCA and RCA vein graft with left-to-right collaterals.  The remainder of his grafts were patent.  All his native arteries were occluded. RHC showed elevated right sided filling pressures and marginal CO (see hemodynamics  below). He was direct admitted for IV diuretics and AHF consultation. He was also observed to have frequent PVCs ~25/min and previous records showed where prior Zio monitor had also showed high burden PVCs ~13%. He was placed on mexiletine for suppression but c/w frequent PVCs, was later switched to amio 200 mg bid.   He had cRMI w/ findings c/w ischemic CM w/ coronary disease pattern LGE update/ scar. Also noted to have large posterior pericardial effusion w/o tamponade as well as moderate TR. He was diuresed w/ IV Lasix w/ improvement in volume status. Diuresed 19 lb. Transitioned to PO torsemide. GDMT initiated.   Plan is to repeat echocardiogram as outpatient to reassess the size of his effusion. If not improving w/ diuresis, will need to consider pericardial window. Should also get repeat Zio to quantify PVC burden on amiodarone. If still high, may need referral to EP for PVC ablation.   On 1/26, he was felt to be stable and cleared by Dr. Haroldine Laws for discharge home. Post hospital f/u has been arranged in 1 week on 2/3 in the [facility]. He will also need repeat outpatient sleep study, given h/o OSA and frequent PVCs.    Cardiac Studies   2D Echo 07/2021  Left ventricular ejection fraction, by estimation, is 30 to 35%. The left ventricle has moderately decreased function. The left ventricle demonstrates global hypokinesis. Left ventricular diastolic parameters are indeterminate. 1. Right ventricular systolic function is mildly reduced. The right ventricular size is normal. 2. 3. Left atrial size was mildly dilated. 4. Right atrial size was mildly dilated. The  mitral valve is normal in structure. Mild to moderate mitral valve regurgitation. No evidence of mitral stenosis. 5. 6. Tricuspid valve regurgitation is moderate. The aortic valve is normal in structure. Aortic valve regurgitation is not visualized. No aortic stenosis is present. 7. The inferior vena cava is normal in size with  greater than 50% respiratory variability, suggesting right atrial pressure of 3 mmHg.   RVSP: 30.7 mmHg   RHC 08/03/21   Hemodynamics:   1: Right atrial pressure-17/17 2: Right ventricular pressure-51/23, mean 27 3: Pulmonary artery pressure-57/9, mean 35 4: Pulmonary wedge pressure-A-wave 28, V wave 24, mean 17 5: Cardiac output-3.7 L/min with an index of 2 L/min/m by Fick 6: LVEDP-24     Diagnostic Dominance: Right   Cardiac MRI 08/04/21 Ischemic scar pattern.  LVEF 29% RVEF 41% Mod TR Large posterior pericardial effusion w/o tamponade    Discharge Weight Range: 136 lb Discharge Vitals: Blood pressure 110/87, pulse 66, temperature (!) 97.1 F (36.2 C), temperature source Oral, resp. rate 16, height 5' 7.5" (1.715 m), weight 62.1 kg, SpO2 96 %.  Labs: Lab Results  Component Value Date   WBC 6.5 08/04/2021   HGB 14.0 08/04/2021   HCT 42.7 08/04/2021   MCV 91.8 08/04/2021   PLT 255 08/04/2021    Recent Labs  Lab 08/06/21 0608  NA 138  K 3.8  CL 99  CO2 28  BUN 14  CREATININE 1.02  CALCIUM 9.3  GLUCOSE 120*   Lab Results  Component Value Date   CHOL 103 10/09/2020   HDL 44.50 10/09/2020   LDLCALC 48 10/09/2020   TRIG 56.0 10/09/2020   BNP (last 3 results) No results for input(s): BNP in the last 8760 hours.  ProBNP (last 3 results) No results for input(s): PROBNP in the last 8760 hours.   Diagnostic Studies/Procedures   ECHOCARDIOGRAM LIMITED  Result Date: 08/05/2021    ECHOCARDIOGRAM LIMITED REPORT   Patient Name:   Shane [name] Date of Exam: 08/05/2021 Medical Rec #:  [id number]          Height:       67.5 in Accession #:    [id number]         Weight:       149.0 lb Date of Birth:  08-15-1965          BSA:          1.794 m Patient Age:    22 years           BP:           119/95 mmHg Patient Gender: M                  HR:           63 bpm. Exam Location:  Inpatient Procedure: Limited Echo Indications:    Limited study for pericardial effusion   History:        Patient has prior history of Echocardiogram examinations, most                 recent 07/15/2021. CAD, Arrythmias:PVC; Risk Factors:Dyslipidemia.  Sonographer:    Arlyss Gandy Referring Phys: [place]  1. Left ventricular ejection fraction, by estimation, is 25%. The left ventricle demonstrates global hypokinesis.  2. Right ventricular systolic function is mildly reduced. The right ventricular size is normal.  3. The aortic valve is tricuspid.  4. The inferior vena cava is normal in size with <50% respiratory variability, suggesting right  atrial pressure of 8 mmHg.  5. There is a large but loculated pericardial effusion located inferior to the left ventricle. The LV does not appear compressed. The RV does not appear significantly affected. No evidence for tamponade.  6. Limited echo with no doppler. FINDINGS  Left Ventricle: Left ventricular ejection fraction, by estimation, is 25%. The left ventricle demonstrates global hypokinesis. The left ventricular internal cavity size was normal in size. There is no left ventricular hypertrophy. Right Ventricle: The right ventricular size is normal. Right ventricular systolic function is mildly reduced. Aortic Valve: The aortic valve is tricuspid. Venous: The inferior vena cava is normal in size with less than 50% respiratory variability, suggesting right atrial pressure of 8 mmHg. LEFT VENTRICLE PLAX 2D LVIDd:         5.10 cm LVIDs:         4.40 cm LV PW:         0.90 cm LV IVS:        0.90 cm  IVC IVC diam: 1.80 cm Dalton McleanMD Electronically signed by Franki Monte Signature Date/Time: 08/05/2021/6:01:16 PM    Final     Discharge Medications   Allergies as of 08/06/2021   No Known Allergies      Medication List     STOP taking these medications    olmesartan 20 MG tablet Commonly known as: BENICAR       TAKE these medications    amiodarone 200 MG tablet Commonly known as: PACERONE Take 1 tablet (200 mg  total) by mouth 2 (two) times daily.   ARIPiprazole 5 MG tablet Commonly known as: ABILIFY Take 1 tablet (5 mg total) by mouth daily.   aspirin 81 MG tablet Take 81 mg by mouth daily.   AVASTIN IV Inject 1 Dose into the eye every 6 (six) weeks.   Besivance 0.6 % Susp Generic drug: Besifloxacin HCl Place 1 drop into the left eye See admin instructions. Instill 1 drop into left eye 4 times daily the day of and the day after eye injection as needed.   carvedilol 6.25 MG tablet Commonly known as: COREG Take 1 tablet by mouth twice daily.   Entresto 49-51 MG Generic drug: sacubitril-valsartan Take 1 tablet by mouth 2 (two) times daily.   escitalopram 20 MG tablet Commonly known as: LEXAPRO Take 1 tablet (20 mg total) by mouth daily.   LORazepam 0.5 MG tablet Commonly known as: ATIVAN Take 1 tablet (0.5 mg total) by mouth 2 (two) times daily. What changed:  when to take this reasons to take this   metFORMIN 750 MG 24 hr tablet Commonly known as: GLUCOPHAGE-XR Take 1 tablet (750 mg total) by mouth daily with breakfast.   nitroGLYCERIN 0.4 MG SL tablet Commonly known as: Nitrostat Place 1 tablet (0.4 mg total) under the tongue every 5 (five) minutes as needed for chest pain.   PRESERVISION AREDS 2 PO Take 2 tablets by mouth in the morning.   rosuvastatin 20 MG tablet Commonly known as: CRESTOR Take 1 tablet by mouth daily. **Please call to schedule office visit prior to further refills**   spironolactone 25 MG tablet Commonly known as: ALDACTONE Take 1 tablet (25 mg total) by mouth daily. Start taking on: August 07, 2021   torsemide 20 MG tablet Commonly known as: DEMADEX Take 2 tablets (40 mg total) by mouth daily. Start taking on: August 07, 2021   ursodiol 500 MG tablet Commonly known as: ACTIGALL Take 500 mg by mouth 2 (two) times  daily.        Disposition   The patient will be discharged in stable condition to home.   Follow-up Information      [facility] Follow up.   Specialty: Cardiology Why: 08/14/21 @ 8:30 AM at the Advanced Heart Failure Clinic at [facility], [place] information: [address] [phone] [place] [phone]                  Duration of Discharge Encounter: Greater than 35 minutes   Signed, Nelida Gores  08/06/2021, 1:47 PM    Patient seen and examined with the above-signed Advanced Practice Provider and/or Housestaff. I personally reviewed laboratory data, imaging studies and relevant notes. I independently examined the patient and formulated the important aspects of the plan. I have edited the note to reflect any of my changes or salient points. I have personally discussed the plan with the patient and/or family.   Feeling better. Has diuresed well. Denies SOB, orthopnea or PND. Eager to go home. PVCs still not suppressed fully.    [facility] [place] for d/c today. Agree with switching mexilitene to amio 200 bid. Other discharge meds reviewed. Needs outpatient sleep study.    Will need follow-up echo to re-evaluate pericardial effusion. May need window.   Discharge time > 30 mins.    Glori Bickers, MD  4:43 PM

## 2021-08-05 NOTE — Plan of Care (Signed)
  Problem: Health Behavior/Discharge Planning: Goal: Ability to manage health-related needs will improve Outcome: Progressing   Problem: Clinical Measurements: Goal: Ability to maintain clinical measurements within normal limits will improve Outcome: Progressing   

## 2021-08-05 NOTE — Progress Notes (Signed)
Echocardiogram 2D Echocardiogram has been performed.  Arlyss Gandy 08/05/2021, 2:18 PM

## 2021-08-05 NOTE — Progress Notes (Addendum)
Advanced Heart Failure Rounding Note  PCP-Cardiologist: None   Subjective:    Still w/ frequent PVCs but improving, ~15/min. K 3.8, Mg 1.9.   BP improved w/ Delene Loll and Arlyce Harman.   Feels much better. No further dyspnea. Asymptomatic w/ PVCs. Has been ambulating. No exertional dyspnea.   cMRI c/w multi-vessel coronary disease pattern scar.  LVEF 29% RVEF 41% Mod TR Large posterior pericardial effusion w/o tamponade   Ace level pending.     Objective:   Weight Range: 67.6 kg Body mass index is 22.99 kg/m.   Vital Signs:   Temp:  [97.6 F (36.4 C)-97.9 F (36.6 C)] 97.9 F (36.6 C) (01/25 0800) Pulse Rate:  [63-77] 63 (01/25 0800) Resp:  [12-18] 16 (01/25 0800) BP: (119-147)/(83-95) 119/95 (01/25 0800) SpO2:  [96 %-99 %] 96 % (01/25 0001) Weight:  [67.6 kg] 67.6 kg (01/24 1200) Last BM Date: 08/02/21  Weight change: Filed Weights   08/03/21 0935 08/04/21 1200  Weight: 70.3 kg 67.6 kg    Intake/Output:   Intake/Output Summary (Last 24 hours) at 08/05/2021 1008 Last data filed at 08/05/2021 0600 Gross per 24 hour  Intake 658.07 ml  Output 3600 ml  Net -2941.93 ml      Physical Exam    General:  Well appearing. No respiratory difficulty HEENT: normal Neck: supple. JVD 10 cm. Carotids 2+ bilat; no bruits. No lymphadenopathy or thyromegaly appreciated. Cor: PMI nondisplaced. Irregular rhythm (PVCs). No rubs, gallops or murmurs. Lungs: clear Abdomen: soft, nontender, nondistended. No hepatosplenomegaly. No bruits or masses. Good bowel sounds. Extremities: no cyanosis, clubbing, rash, edema Neuro: alert & oriented x 3, cranial nerves grossly intact. moves all 4 extremities w/o difficulty. Affect pleasant.    Telemetry   NSR w/ frequent PVCs but improving ~15/min (down from 25/min)   EKG    No new EKG to review   Labs    CBC Recent Labs    08/03/21 1758 08/04/21 0247  WBC 7.6 6.5  HGB 14.9 14.0  HCT 45.2 42.7  MCV 94.6 91.8  PLT 242 481    Basic Metabolic Panel Recent Labs    08/03/21 1202 08/04/21 0247  NA 140 136  K 3.7 3.6  CL  --  102  CO2  --  23  GLUCOSE  --  86  BUN  --  12  CREATININE  --  1.06  CALCIUM  --  8.6*  MG  --  1.7   Liver Function Tests No results for input(s): AST, ALT, ALKPHOS, BILITOT, PROT, ALBUMIN in the last 72 hours. No results for input(s): LIPASE, AMYLASE in the last 72 hours. Cardiac Enzymes No results for input(s): CKTOTAL, CKMB, CKMBINDEX, TROPONINI in the last 72 hours.  BNP: BNP (last 3 results) No results for input(s): BNP in the last 8760 hours.  ProBNP (last 3 results) No results for input(s): PROBNP in the last 8760 hours.   D-Dimer No results for input(s): DDIMER in the last 72 hours. Hemoglobin A1C No results for input(s): HGBA1C in the last 72 hours. Fasting Lipid Panel No results for input(s): CHOL, HDL, LDLCALC, TRIG, CHOLHDL, LDLDIRECT in the last 72 hours. Thyroid Function Tests No results for input(s): TSH, T4TOTAL, T3FREE, THYROIDAB in the last 72 hours.  Invalid input(s): FREET3  Other results:   Imaging    No results found.   Medications:     Scheduled Medications:  aspirin  81 mg Oral Daily   carvedilol  6.25 mg Oral BID WC  escitalopram  20 mg Oral Daily   furosemide  40 mg Intravenous BID   mexiletine  300 mg Oral Q12H   pneumococcal 23 valent vaccine  0.5 mL Intramuscular Tomorrow-1000   rosuvastatin  20 mg Oral Daily   sacubitril-valsartan  1 tablet Oral BID   sodium chloride flush  3 mL Intravenous Q12H   spironolactone  12.5 mg Oral Daily    Infusions:  sodium chloride      PRN Medications: sodium chloride, acetaminophen, morphine injection, ondansetron (ZOFRAN) IV, sodium chloride flush    Patient Profile   56 y/o male w/ CAD and prior ICM w/ improved EF post CABG, now w/ new drop in EF, 30-35%, mildly reduced RV and new exertional dyspnea/fatigue. Presented for [facility] w/ 3/4 patent grafts and elevated rt sided  filling pressures. Admitted from cath lab for further management.   Assessment/Plan   Acute Systolic Heart Failure  - prior h/o ischemic CM, that improved post revascularization - EF 30-35% at time of CABG in 2015, EF normalized on repeat Echos in 2016 and 2017 (50-55%) - EF back down, 30-35%, RV mildly reduced. LHC w/ occluded SVG-RCA but distal RCA filled by L>R collaterals. All other grafts patent. EF out of proportion to degree of CAD. RHC w/ elevated Rt sided filling pressures w/ fairly normal PCWP ~17. CO marginal, CI 2.0.  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 Prior Zio w/ 13% PVC burden - cMRI c/w ICM w/ scar.  - Volume much improve w/ Lasix, still fluid overloaded - Continue IV Lasix 40 mg bid - Continue Entresto 24-26 mg bid  - Continue Spiro 12.5 mg daily  - SGLT2 in soon  - Coreg 6.25 mg bid - some dyssynchrony/ paradoxical septal motion noted on echo but QRS <150 ms. Not candidate for CRT-D    - Will need repeat echo in 2-3 months after suppression of PVCs    2. PVCs - frequent on tele, Holter 5/21 showed 13% burden  - suspect contributing to cardiomyopathy  - no coronary ischemia on cath  - Continue mexiletine 300 bid. If still frequent PVCs, will switch to [facility] tomorrow - Supp K (3.8)  and Mg (1.9) - keep K > 4.0 and Mg > 2.0 - Has OSA but admits to poor compliance w/ CPAP. Encouraged nightly use  - Will need repeat Zio after trial of AAD. If still high burden, may need PVC ablation    3. CAD - remote MI 2011, s/p PCI to pLAD - CABGx 4 in 2015 - LHC this admit w/ 3/4 patent grafts, occluded SVG- RCA w/ L>R collaterals.  - No CP  - ASA 81 mg - Coreg 6.25 mg bid (home dose, avoid up titration w/ marginal output)  - Crestor 20 mg   4. Pericardial effusion - large posterior effusion on MRI without tamponade.  - continue w/ diuretics plan repeat echo in several weeks, if persistent will plan tap    5. Hypertension  - improved -  GDMT per above    6. OSA - reports poor compliance - encouraged nightly use   7. Hypokalemia/ Hypomagnesemia - K 3.8, Mg 1.8  - K and Mg supp given  - Continue Spiro 12.5 mg daily     Length of Stay: 1  Brittainy Simmons, PA-C  08/05/2021, 10:08 AM  Advanced Heart Failure Team Pager [phone] (M-F; 7a - 5p)  Please contact [facility] for night-coverage after hours (5p -7a ) and weekends  on [email or web address]   Patient seen and examined with the above-signed Advanced Practice Provider and/or Housestaff. I personally reviewed laboratory data, imaging studies and relevant notes. I independently examined the patient and formulated the important aspects of the plan. I have edited the note to reflect any of my changes or salient points. I have personally discussed the plan with the patient and/or family.  Breathing better with diuresis. No orthopnea or PND.   PVCs seem to be improving on mexilitene.   General:  Well appearing. No resp difficulty HEENT: normal Neck: supple. JVP 9-10 Carotids 2+ bilat; no bruits. No lymphadenopathy or thryomegaly appreciated. Cor: PMI nondisplaced. Regular rate & rhythm. No rubs, gallops or murmurs. Lungs: clear Abdomen: soft, nontender, nondistended. No hepatosplenomegaly. No bruits or masses. Good bowel sounds. Extremities: no cyanosis, clubbing, rash, tr-1+ edema Neuro: alert & orientedx3, cranial nerves grossly intact. moves all 4 extremities w/o difficulty. Affect pleasant  Has R>L heart failure. Diuresing well. Will continue IV lasix one more day.   cMRI reviewed with Drs. Mclean and Gardiner Rhyme. Feel scar pattern is ischemic not sarcoid. Large pericardial effusion posterior to the heart.   Plan: 1. Continue IV diuresis one more day 2. Follow PVCs. If still frequent switch mexilitene to amio 3. Will need repeat outpatient sleep study 4. Follow pericardial effusion. If nt improving with diuresis consider pericardial window.   Glori Bickers, MD   4:48 PM

## 2021-08-05 NOTE — Progress Notes (Signed)
Mobility Specialist Progress Note    08/05/21 1501  Mobility  Activity Ambulated independently in hallway  Level of Assistance Independent  Assistive Device None  Distance Ambulated (ft) 480 ft  Activity Response Tolerated well  $Mobility charge 1 Mobility   Pre-Mobility: 69 HR Post-Mobility: 83 HR  Pt received in bed and agreeable. No complaints on walk. Returned to bed with call bell in reach and wife present.   [facility] Mobility Specialist  M.S. 2C and 6E: [phone] M.S. 4E: [phone]

## 2021-08-06 ENCOUNTER — Other Ambulatory Visit (HOSPITAL_COMMUNITY): Payer: Self-pay

## 2021-08-06 LAB — BASIC METABOLIC PANEL
Anion gap: 11 (ref 5–15)
BUN: 14 mg/dL (ref 6–20)
CO2: 28 mmol/L (ref 22–32)
Calcium: 9.3 mg/dL (ref 8.9–10.3)
Chloride: 99 mmol/L (ref 98–111)
Creatinine, Ser: 1.02 mg/dL (ref 0.61–1.24)
GFR, Estimated: >60 mL/min (ref >60)
Glucose, Bld: 120 mg/dL — ABNORMAL HIGH (ref 70–99)
Potassium: 3.8 mmol/L (ref 3.5–5.1)
Sodium: 138 mmol/L (ref 135–145)

## 2021-08-06 LAB — MAGNESIUM: Magnesium: 1.9 mg/dL (ref 1.7–2.4)

## 2021-08-06 LAB — ANGIOTENSIN CONVERTING ENZYME: Angiotensin-Converting Enzyme: 81 U/L (ref 14–82)

## 2021-08-06 MED ORDER — MAGNESIUM SULFATE 2 GM/50ML IV SOLN
2.0000 g | Freq: Once | INTRAVENOUS | Status: AC
  Administered 2021-08-06: 2 g via INTRAVENOUS
  Filled 2021-08-06: qty 50

## 2021-08-06 MED ORDER — SPIRONOLACTONE 25 MG PO TABS
25.0000 mg | ORAL_TABLET | Freq: Every day | ORAL | 5 refills | Status: DC
  Filled 2021-08-06: qty 30, 30d supply, fill #0

## 2021-08-06 MED ORDER — TORSEMIDE 20 MG PO TABS
40.0000 mg | ORAL_TABLET | Freq: Every day | ORAL | 5 refills | Status: DC
  Filled 2021-08-06: qty 60, 30d supply, fill #0
  Filled 2021-09-05: qty 60, 30d supply, fill #1

## 2021-08-06 MED ORDER — SPIRONOLACTONE 25 MG PO TABS: 25.0000 mg | ORAL_TABLET | Freq: Every day | ORAL | Status: DC

## 2021-08-06 MED ORDER — AMIODARONE HCL 200 MG PO TABS
200.0000 mg | ORAL_TABLET | Freq: Two times a day (BID) | ORAL | Status: DC
  Administered 2021-08-06: 200 mg via ORAL
  Filled 2021-08-06: qty 1

## 2021-08-06 MED ORDER — SACUBITRIL-VALSARTAN 49-51 MG PO TABS
1.0000 | ORAL_TABLET | Freq: Two times a day (BID) | ORAL | 5 refills | Status: DC
  Filled 2021-08-06: qty 60, 30d supply, fill #0

## 2021-08-06 MED ORDER — AMIODARONE HCL 200 MG PO TABS
200.0000 mg | ORAL_TABLET | Freq: Two times a day (BID) | ORAL | 5 refills | Status: DC
  Filled 2021-08-06: qty 60, 30d supply, fill #0
  Filled 2021-09-05: qty 60, 30d supply, fill #1

## 2021-08-06 MED ORDER — TORSEMIDE 20 MG PO TABS: 20.0000 mg | ORAL_TABLET | Freq: Every day | ORAL | Status: DC

## 2021-08-06 MED ORDER — POTASSIUM CHLORIDE CRYS ER 20 MEQ PO TBCR
40.0000 meq | EXTENDED_RELEASE_TABLET | Freq: Once | ORAL | Status: AC
  Administered 2021-08-06: 40 meq via ORAL
  Filled 2021-08-06: qty 2

## 2021-08-06 NOTE — TOC Initial Note (Signed)
Transition of Care [facility]) - Initial/Assessment Note    Patient Details  Name: Shane [name] MRN: [id number] Date of Birth: 1966-06-10  Transition of Care [facility]) CM/SW Contact:    Shane Rasher, RN Phone Number: [phone] 08/06/2021, 12:48 PM  Clinical Narrative:                 HF TOC CM spoke to pt and wife at bedside. States he still works. Has CPAP and scale at home. Provided pt with [facility] copay card. Pt states he plans to use CPAP nightly and weigh daily.   Expected Discharge Plan: Home/Self Care Barriers to Discharge: No Barriers Identified   Patient Goals and CMS Choice Patient states their goals for this hospitalization and ongoing recovery are:: return home and to work [employer].gov Compare Post Acute Care list provided to:: Patient    Expected Discharge Plan and Services Expected Discharge Plan: Home/Self Care   Discharge Planning Services: CM Consult     Expected Discharge Date: 08/06/21                 Prior Living Arrangements/Services   Lives with:: Spouse Patient language and need for interpreter reviewed:: Yes Do you feel safe going back to the place where you live?: Yes      Need for Family Participation in Patient Care: No (Comment) Care giver support system in place?: No (comment) Current home services: DME (CPAP) Criminal Activity/Legal Involvement Pertinent to Current Situation/Hospitalization: No - Comment as needed  Activities of Daily Living Home Assistive Devices/Equipment: None ADL Screening (condition at time of admission) Patient's cognitive ability adequate to safely complete daily activities?: Yes Is the patient deaf or have difficulty hearing?: No Does the patient have difficulty seeing, even when wearing glasses/contacts?: No Does the patient have difficulty concentrating, remembering, or making decisions?: No Patient able to express need for assistance with ADLs?: Yes Does the patient have difficulty dressing or  bathing?: No Independently performs ADLs?: Yes (appropriate for developmental age) Does the patient have difficulty walking or climbing stairs?: Yes Weakness of Legs: None Weakness of Arms/Hands: None  Permission Sought/Granted Permission sought to share information with : Case Manager, Family Supports, PCP Permission granted to share information with : Yes, Verbal Permission Granted  Share Information with NAME: Shane [name]     Permission granted to share info w Relationship: wife  Permission granted to share info w Contact Information: [phone]  Emotional Assessment Appearance:: Appears stated age Attitude/Demeanor/Rapport: Engaged Affect (typically observed): Accepting Orientation: : Oriented to Self, Oriented to Place, Oriented to  Time, Oriented to Situation   Psych Involvement: No (comment)  Admission diagnosis:  CAD (coronary artery disease) [I25.10] Patient Active Problem List   Diagnosis Date Noted   LV dysfunction 07/24/2021   CAD (coronary artery disease) 12/03/2019   Essential hypertension 07/24/2019   Alcohol use disorder, mild, abuse 03/25/2019   OSA on CPAP 09/07/2018   GAD (generalized anxiety disorder) 05/25/2018   Major depression, recurrent, chronic ([place]) 05/25/2018   S/P CABG (coronary artery bypass graft) 11/16/2017   Insulin resistance 11/16/2017   Subclinical hypothyroidism 11/16/2017   Macular degeneration    PVC's (premature ventricular contractions) 09/11/2014   Mixed hyperlipidemia 05/24/2014   History of Hodgkin's lymphoma 07/13/1995   PCP:  Leamon Arnt, MD Pharmacy:   [facility], [address] [place] [address] Phone: [phone] Fax: [phone]  [place], [place] -  [place] AT [facility] OF [address] [place] [address] Phone: [phone] Fax: [place] [address] [address] Phone: [phone] Fax: [phone]     Social Determinants of Health (SDOH) Interventions    Readmission Risk Interventions No flowsheet data found.

## 2021-08-06 NOTE — Progress Notes (Addendum)
Advanced Heart Failure Rounding Note  PCP-Cardiologist: None   Subjective:    Brisk diuresis yesterday, 5.2 L in UOP. Wt not checked in 2 days. Have asked RN to obtain standing wt.   Scr stable, 1.02 K 3.8 Mg 1.9   C/w PVCs, wide variability. This morning ~15/min   cMRI c/w multi-vessel coronary disease pattern scar.  LVEF 29% RVEF 41% Mod TR Large posterior pericardial effusion w/o tamponade   Ace level 81   He feels well. Denies resting or exertional dyspnea. No chest pain.    Objective:   Weight Range: 67.6 kg Body mass index is 22.99 kg/m.   Vital Signs:   Temp:  [97.3 F (36.3 C)-97.7 F (36.5 C)] 97.6 F (36.4 C) (01/26 0630) Pulse Rate:  [65-69] 65 (01/26 0630) Resp:  [14-16] 16 (01/26 0630) BP: (110-130)/(76-95) 120/87 (01/26 0630) SpO2:  [96 %-99 %] 96 % (01/26 0630) Last BM Date: 08/02/21  Weight change: Filed Weights   08/03/21 0935 08/04/21 1200  Weight: 70.3 kg 67.6 kg    Intake/Output:   Intake/Output Summary (Last 24 hours) at 08/06/2021 0909 Last data filed at 08/06/2021 0719 Gross per 24 hour  Intake 533 ml  Output 4250 ml  Net -3717 ml      Physical Exam   General:  Well appearing. No respiratory difficulty HEENT: normal Neck: supple. no JVD. Carotids 2+ bilat; no bruits. No lymphadenopathy or thyromegaly appreciated. Cor: PMI nondisplaced. Irregular rhythm. No rubs, gallops or murmurs. Lungs: clear Abdomen: soft, nontender, nondistended. No hepatosplenomegaly. No bruits or masses. Good bowel sounds. Extremities: no cyanosis, clubbing, rash, edema Neuro: alert & oriented x 3, cranial nerves grossly intact. moves all 4 extremities w/o difficulty. Affect pleasant.  Telemetry   NSR 80s. C/w PVCs, this morning averaging ~15/min   EKG    No new EKG to review   Labs    CBC Recent Labs    08/03/21 1758 08/04/21 0247  WBC 7.6 6.5  HGB 14.9 14.0  HCT 45.2 42.7  MCV 94.6 91.8  PLT 242 347   Basic Metabolic  Panel Recent Labs    08/05/21 1052 08/06/21 0608  NA 137 138  K 3.8 3.8  CL 101 99  CO2 27 28  GLUCOSE 128* 120*  BUN 14 14  CREATININE 0.93 1.02  CALCIUM 8.7* 9.3  MG 1.9 1.9   Liver Function Tests No results for input(s): AST, ALT, ALKPHOS, BILITOT, PROT, ALBUMIN in the last 72 hours. No results for input(s): LIPASE, AMYLASE in the last 72 hours. Cardiac Enzymes No results for input(s): CKTOTAL, CKMB, CKMBINDEX, TROPONINI in the last 72 hours.  BNP: BNP (last 3 results) No results for input(s): BNP in the last 8760 hours.  ProBNP (last 3 results) No results for input(s): PROBNP in the last 8760 hours.   D-Dimer No results for input(s): DDIMER in the last 72 hours. Hemoglobin A1C No results for input(s): HGBA1C in the last 72 hours. Fasting Lipid Panel No results for input(s): CHOL, HDL, LDLCALC, TRIG, CHOLHDL, LDLDIRECT in the last 72 hours. Thyroid Function Tests No results for input(s): TSH, T4TOTAL, T3FREE, THYROIDAB in the last 72 hours.  Invalid input(s): FREET3  Other results:   Imaging    ECHOCARDIOGRAM LIMITED  Result Date: 08/05/2021    ECHOCARDIOGRAM LIMITED REPORT   Patient Name:   Shane [name] Date of Exam: 08/05/2021 Medical Rec #:  [id number]          Height:  67.5 in Accession #:    [id number]         Weight:       149.0 lb Date of Birth:  07/28/1965          BSA:          1.794 m Patient Age:    56 years           BP:           119/95 mmHg Patient Gender: M                  HR:           63 bpm. Exam Location:  Inpatient Procedure: Limited Echo Indications:    Limited study for pericardial effusion  History:        Patient has prior history of Echocardiogram examinations, most                 recent 07/15/2021. CAD, Arrythmias:PVC; Risk Factors:Dyslipidemia.  Sonographer:    Arlyss Gandy Referring Phys: Rowena  1. Left ventricular ejection fraction, by estimation, is 25%. The left ventricle demonstrates global  hypokinesis.  2. Right ventricular systolic function is mildly reduced. The right ventricular size is normal.  3. The aortic valve is tricuspid.  4. The inferior vena cava is normal in size with <50% respiratory variability, suggesting right atrial pressure of 8 mmHg.  5. There is a large but loculated pericardial effusion located inferior to the left ventricle. The LV does not appear compressed. The RV does not appear significantly affected. No evidence for tamponade.  6. Limited echo with no doppler. FINDINGS  Left Ventricle: Left ventricular ejection fraction, by estimation, is 25%. The left ventricle demonstrates global hypokinesis. The left ventricular internal cavity size was normal in size. There is no left ventricular hypertrophy. Right Ventricle: The right ventricular size is normal. Right ventricular systolic function is mildly reduced. Aortic Valve: The aortic valve is tricuspid. Venous: The inferior vena cava is normal in size with less than 50% respiratory variability, suggesting right atrial pressure of 8 mmHg. LEFT VENTRICLE PLAX 2D LVIDd:         5.10 cm LVIDs:         4.40 cm LV PW:         0.90 cm LV IVS:        0.90 cm  IVC IVC diam: 1.80 cm Dalton McleanMD Electronically signed by Franki Monte Signature Date/Time: 08/05/2021/6:01:16 PM    Final      Medications:     Scheduled Medications:  aspirin  81 mg Oral Daily   carvedilol  6.25 mg Oral BID WC   escitalopram  20 mg Oral Daily   furosemide  40 mg Intravenous Q12H   mexiletine  300 mg Oral Q12H   pneumococcal 23 valent vaccine  0.5 mL Intramuscular Tomorrow-1000   rosuvastatin  20 mg Oral Daily   sacubitril-valsartan  1 tablet Oral BID   sodium chloride flush  3 mL Intravenous Q12H   spironolactone  12.5 mg Oral Daily    Infusions:  sodium chloride      PRN Medications: sodium chloride, acetaminophen, morphine injection, ondansetron (ZOFRAN) IV, sodium chloride flush    Patient Profile   56 y/o male w/ CAD and  prior ICM w/ improved EF post CABG, now w/ new drop in EF, 30-35%, mildly reduced RV and new exertional dyspnea/fatigue. Presented for [facility] w/ 3/4 patent grafts and elevated rt sided filling  pressures. Admitted from cath lab for further management.   Assessment/Plan   Acute Systolic Heart Failure  - prior h/o ischemic CM, that improved post revascularization - EF 30-35% at time of CABG in 2015, EF normalized on repeat Echos in 2016 and 2017 (50-55%) - EF back down, 30-35%, RV mildly reduced. LHC w/ occluded SVG-RCA but distal RCA filled by L>R collaterals. All other grafts patent. EF out of proportion to degree of CAD. RHC w/ elevated Rt sided filling pressures w/ fairly normal PCWP ~17. CO marginal, CI 2.0.  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 Prior Zio w/ 13% PVC burden - cMRI c/w ICM w/ scar.  - Volume much improved w/ Lasix. Switch to PO torsemide 40 mg daily  - Continue Entresto 24-26 mg bid  - Increase Spiro  to 25 mg daily  - SGLT2 in soon (outpatient f/u)  - Coreg 6.25 mg bid - some dyssynchrony/ paradoxical septal motion noted on echo but QRS <150 ms. Not candidate for CRT-D    - Will need repeat echo in 2-3 months after suppression of PVCs    2. PVCs - frequent on tele, Holter 5/21 showed 13% burden  - suspect contributing to cardiomyopathy  - no coronary ischemia on cath  - On mexiletine 300 bid. Still frequent PVCs, switch to amio 200 mg bid  - Supp K (3.8)  and Mg (1.9) - keep K > 4.0 and Mg > 2.0 - Has OSA but admits to poor compliance w/ CPAP. Encouraged nightly use  - Will need repeat Zio after trial of AAD. If still high burden, may need PVC ablation    3. CAD - remote MI 2011, s/p PCI to pLAD - CABGx 4 in 2015 - LHC this admit w/ 3/4 patent grafts, occluded SVG- RCA w/ L>R collaterals.  - No CP  - ASA 81 mg - Coreg 6.25 mg bid (home dose, avoid up titration w/ marginal output)  - Crestor 20 mg   4. Pericardial effusion -  large posterior effusion on MRI without tamponade.  - continue w/ diuretics plan repeat echo in several weeks, if persistent will plan tap    5. Hypertension  - improved - GDMT per above    6. OSA - reports poor compliance - encouraged nightly use   7. Hypokalemia/ Hypomagnesemia - K 3.8, Mg 1.8  - K and Mg supp given  - Increase Spiro to 25 mg daily   D/c home today. Will arrange f/u in the [facility] in 1 week.    Length of Stay: 2  Lyda Jester, PA-C  08/06/2021, 9:09 AM  Advanced Heart Failure Team Pager [phone] (M-F; 7a - 5p)  Please contact [facility] for night-coverage after hours (5p -7a ) and weekends on [email or web address]   Patient seen and examined with the above-signed Advanced Practice Provider and/or Housestaff. I personally reviewed laboratory data, imaging studies and relevant notes. I independently examined the patient and formulated the important aspects of the plan. I have edited the note to reflect any of my changes or salient points. I have personally discussed the plan with the patient and/or family.  Feeling better. Has diuresed well. Denies SOB, orthopnea or PND. Eager to go home. PVCs still not suppressed fully.   [place] for d/c today. Agree with switching mexilitene to amio 200 bid. Other discharge meds reviewed. Needs outpatient sleep study.   Will need follow-up echo to re-evaluate pericardial effusion. May need window.   Quillian Quince  Colbi Staubs, MD  4:43 PM

## 2021-08-11 ENCOUNTER — Encounter (INDEPENDENT_AMBULATORY_CARE_PROVIDER_SITE_OTHER): Payer: BC Managed Care – PPO | Admitting: Ophthalmology

## 2021-08-11 ENCOUNTER — Other Ambulatory Visit: Payer: Self-pay

## 2021-08-11 DIAGNOSIS — B399 Histoplasmosis, unspecified: Secondary | ICD-10-CM | POA: Diagnosis not present

## 2021-08-11 DIAGNOSIS — H43813 Vitreous degeneration, bilateral: Secondary | ICD-10-CM

## 2021-08-11 DIAGNOSIS — H35033 Hypertensive retinopathy, bilateral: Secondary | ICD-10-CM

## 2021-08-11 DIAGNOSIS — H353111 Nonexudative age-related macular degeneration, right eye, early dry stage: Secondary | ICD-10-CM

## 2021-08-11 DIAGNOSIS — H353221 Exudative age-related macular degeneration, left eye, with active choroidal neovascularization: Secondary | ICD-10-CM | POA: Diagnosis not present

## 2021-08-11 DIAGNOSIS — H348322 Tributary (branch) retinal vein occlusion, left eye, stable: Secondary | ICD-10-CM

## 2021-08-11 DIAGNOSIS — I10 Essential (primary) hypertension: Secondary | ICD-10-CM

## 2021-08-13 DIAGNOSIS — I219 Acute myocardial infarction, unspecified: Secondary | ICD-10-CM | POA: Diagnosis not present

## 2021-08-13 DIAGNOSIS — N179 Acute kidney failure, unspecified: Secondary | ICD-10-CM | POA: Diagnosis not present

## 2021-08-13 DIAGNOSIS — R7989 Other specified abnormal findings of blood chemistry: Secondary | ICD-10-CM | POA: Diagnosis not present

## 2021-08-13 DIAGNOSIS — Z1211 Encounter for screening for malignant neoplasm of colon: Secondary | ICD-10-CM | POA: Diagnosis not present

## 2021-08-13 DIAGNOSIS — Z79899 Other long term (current) drug therapy: Secondary | ICD-10-CM | POA: Diagnosis not present

## 2021-08-13 DIAGNOSIS — I255 Ischemic cardiomyopathy: Secondary | ICD-10-CM | POA: Diagnosis not present

## 2021-08-13 DIAGNOSIS — I509 Heart failure, unspecified: Secondary | ICD-10-CM | POA: Diagnosis not present

## 2021-08-14 ENCOUNTER — Other Ambulatory Visit: Payer: Self-pay

## 2021-08-14 ENCOUNTER — Other Ambulatory Visit (HOSPITAL_COMMUNITY): Payer: Self-pay | Admitting: Internal Medicine

## 2021-08-14 ENCOUNTER — Ambulatory Visit (HOSPITAL_COMMUNITY)
Admission: RE | Admit: 2021-08-14 | Discharge: 2021-08-14 | Disposition: A | Discharge status: Home / Self Care | Payer: BC Managed Care – PPO | Source: Ambulatory Visit | Attending: Internal Medicine | Admitting: Internal Medicine

## 2021-08-14 ENCOUNTER — Encounter (HOSPITAL_COMMUNITY): Payer: Self-pay

## 2021-08-14 ENCOUNTER — Other Ambulatory Visit (HOSPITAL_COMMUNITY): Payer: Self-pay

## 2021-08-14 ENCOUNTER — Ambulatory Visit (HOSPITAL_COMMUNITY)
Admit: 2021-08-14 | Discharge: 2021-08-14 | Disposition: A | Discharge status: Home / Self Care | Payer: BC Managed Care – PPO | Attending: Family Medicine | Admitting: Family Medicine

## 2021-08-14 ENCOUNTER — Telehealth (HOSPITAL_COMMUNITY): Payer: Self-pay | Admitting: Family Medicine

## 2021-08-14 ENCOUNTER — Telehealth (HOSPITAL_COMMUNITY): Payer: Self-pay

## 2021-08-14 VITALS — BP 84/66 | HR 45 | Wt 140.6 lb

## 2021-08-14 DIAGNOSIS — Z79899 Other long term (current) drug therapy: Secondary | ICD-10-CM | POA: Insufficient documentation

## 2021-08-14 DIAGNOSIS — E876 Hypokalemia: Secondary | ICD-10-CM | POA: Diagnosis not present

## 2021-08-14 DIAGNOSIS — Z923 Personal history of irradiation: Secondary | ICD-10-CM | POA: Diagnosis not present

## 2021-08-14 DIAGNOSIS — G4733 Obstructive sleep apnea (adult) (pediatric): Secondary | ICD-10-CM | POA: Insufficient documentation

## 2021-08-14 DIAGNOSIS — I493 Ventricular premature depolarization: Secondary | ICD-10-CM

## 2021-08-14 DIAGNOSIS — I255 Ischemic cardiomyopathy: Secondary | ICD-10-CM | POA: Insufficient documentation

## 2021-08-14 DIAGNOSIS — I110 Hypertensive heart disease with heart failure: Secondary | ICD-10-CM | POA: Diagnosis not present

## 2021-08-14 DIAGNOSIS — Z9989 Dependence on other enabling machines and devices: Secondary | ICD-10-CM

## 2021-08-14 DIAGNOSIS — I25810 Atherosclerosis of coronary artery bypass graft(s) without angina pectoris: Secondary | ICD-10-CM | POA: Diagnosis not present

## 2021-08-14 DIAGNOSIS — I3139 Other pericardial effusion (noninflammatory): Secondary | ICD-10-CM | POA: Insufficient documentation

## 2021-08-14 DIAGNOSIS — I5022 Chronic systolic (congestive) heart failure: Secondary | ICD-10-CM | POA: Insufficient documentation

## 2021-08-14 DIAGNOSIS — I2510 Atherosclerotic heart disease of native coronary artery without angina pectoris: Secondary | ICD-10-CM

## 2021-08-14 DIAGNOSIS — Z8572 Personal history of non-Hodgkin lymphomas: Secondary | ICD-10-CM | POA: Diagnosis not present

## 2021-08-14 DIAGNOSIS — Z9221 Personal history of antineoplastic chemotherapy: Secondary | ICD-10-CM | POA: Insufficient documentation

## 2021-08-14 DIAGNOSIS — Z955 Presence of coronary angioplasty implant and graft: Secondary | ICD-10-CM | POA: Diagnosis not present

## 2021-08-14 DIAGNOSIS — Z91199 Patient's noncompliance with other medical treatment and regimen due to unspecified reason: Secondary | ICD-10-CM | POA: Insufficient documentation

## 2021-08-14 DIAGNOSIS — I252 Old myocardial infarction: Secondary | ICD-10-CM | POA: Diagnosis not present

## 2021-08-14 DIAGNOSIS — I959 Hypotension, unspecified: Secondary | ICD-10-CM | POA: Diagnosis not present

## 2021-08-14 DIAGNOSIS — Z7984 Long term (current) use of oral hypoglycemic drugs: Secondary | ICD-10-CM | POA: Diagnosis not present

## 2021-08-14 DIAGNOSIS — I10 Essential (primary) hypertension: Secondary | ICD-10-CM

## 2021-08-14 LAB — MAGNESIUM: Magnesium: 2.6 mg/dL — ABNORMAL HIGH (ref 1.7–2.4)

## 2021-08-14 MED ORDER — ENTRESTO 24-26 MG PO TABS: 1.0000 | ORAL_TABLET | Freq: Two times a day (BID) | ORAL | 3 refills | Status: DC

## 2021-08-14 MED ORDER — SPIRONOLACTONE 25 MG PO TABS: 12.5000 mg | ORAL_TABLET | Freq: Every day | ORAL | 5 refills | Status: DC

## 2021-08-14 MED ORDER — MEXILETINE HCL 200 MG PO CAPS: 200.0000 mg | ORAL_CAPSULE | Freq: Two times a day (BID) | ORAL | 3 refills | Status: DC

## 2021-08-14 MED ORDER — DAPAGLIFLOZIN PROPANEDIOL 10 MG PO TABS: 10.0000 mg | ORAL_TABLET | Freq: Every day | ORAL | 6 refills | Status: DC

## 2021-08-14 NOTE — Progress Notes (Signed)
ADVANCED HF CLINIC CONSULT NOTE  Primary Care: Leamon Arnt, MD General Cardiologist: Dr. Gwenlyn Found HF Cardiologist: Dr. Haroldine Laws  HPI: Shane [name] is a 56 y.o. 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   Echo at time of CABG showed moderately reduced LVEF, 30-35%. EF improved post revascularization and had normalized, back to 50-55%, on repeat echo in 2016. In 2017, he underwent cardiac w/u for decrease exercise tolerance. Echo w/ normal LVEF, 50-55%, no significant valvular diease. NST was normal.    Previously moderate drinker, found to have elevated LFTS and unintentional weight loss. Concern for primary biliary cirrhosis and underwent liver biopsy at [facility], showing nonspecific findings of diffuse congestion and sinusoidal dilation, that raised the possibility of elevated right-sided heart pressures. Biopsy did not suggest significant fibrosis or biliary injury (PBC). He was referred back to cardiology for echo.    Echo 1/23 EF back down to 30-35%, RV mildly reduced, mild-mod MR. Mod TR.    He underwent R/LHC on 08/03/20 which showed occluded native RCA and RCA vein graft with left-to-right collaterals. The remainder of his grafts were patent.  All his native arteries were occluded. RHC showed elevated right sided filling pressures and marginal CO (see hemodynamics below). He was direct admitted for IV diuretics and AHF consultation. He was also observed to have frequent PVCs ~25/min and previous records showed where prior Zio monitor had also showed high burden PVCs ~13%. He was placed on mexiletine for suppression but c/w frequent PVCs, was later switched to amio 200 mg bid. cRMI w/ findings c/w ischemic CM w/ coronary disease pattern LGE update/ scar. Also noted to have large posterior pericardial effusion w/o tamponade as well as moderate TR. He was  diuresed w/ IV Lasix and switched to po torsemide, diuresed 19 lb. GDMT initiated and he was discharged home, weight 148 lbs.   Today he returns for post hospital HF follow up with his wife. He is not SOB walking on flat ground or with stairs. Some dizziness on standing, but no falls. Overall feeling fine. Denies palpitations, abnormal bleeding, CP,  edema, or PND/Orthopnea. Appetite ok. No fever or chills. Weight at home 137 pounds. Taking all medications. Planning a trip to [place] in 2 weeks, wants to know if he can go.   Cardiac Studies  - Echo (1/23): EF 30-35%, moderate LV dysfunction, global LV HK, RV mildly reduced, RVSP 30.7 mmHg, mild to mod MR, moderate TR   - R/LHC 08/03/21:  occluded RCA and RCA vein graft with left-to-right collaterals.  The remainder of his grafts are patent.  All his native arteries were occluded.   Hemodynamics: 1: Right atrial pressure-17/17 2: Right ventricular pressure-51/23, mean 27 3: Pulmonary artery pressure-57/9, mean 35 4: Pulmonary wedge pressure-A-wave 28, V wave 24, mean 17 5: Cardiac output-3.7 L/min with an index of 2 L/min/m by Fick 6: LVEDP-24   - Cardiac MRI 08/04/21: Ischemic scar pattern.  LVEF 29% RVEF 41% Mod TR Large posterior pericardial effusion w/o tamponade    Review of Systems: [y] = yes, [ ]  = no   General: Weight gain [ ] ; Weight loss [ ] ; Anorexia [ ] ; Fatigue [ ] ; Fever [ ] ; Chills [ ] ; Weakness [ ]   Cardiac: Chest pain/pressure [ ] ; Resting SOB [ ] ; Exertional SOB [ ] ; Orthopnea [ ] ; Pedal Edema [ ] ;  Palpitations [ ] ; Syncope [ ] ; Presyncope [ ] ; Paroxysmal nocturnal dyspnea[ ]   Pulmonary: Cough [ ] ; Wheezing[ ] ; Hemoptysis[ ] ; Sputum [ ] ; Snoring [y]  GI: Vomiting[ ] ; Dysphagia[ ] ; Melena[ ] ; Hematochezia [ ] ; Heartburn[ ] ; Abdominal pain [ ] ; Constipation [ ] ; Diarrhea [ ] ; BRBPR [ ]   GU: Hematuria[ ] ; Dysuria [ ] ; Nocturia[ ]   Vascular: Pain in legs with walking [ ] ; Pain in feet with lying flat [ ] ; Non-healing sores [  ]; Stroke [ ] ; TIA [ ] ; Slurred speech [ ] ;  Neuro: Headaches[ ] ; Vertigo[ ] ; Seizures[ ] ; Paresthesias[ ] ;Blurred vision [ ] ; Diplopia [ ] ; Vision changes [ ]   Ortho/Skin: Arthritis [ ] ; Joint pain [ ] ; Muscle pain [ ] ; Joint swelling [ ] ; Back Pain [ ] ; Rash [ ]   Psych: Depression[ ] ; Anxiety[ ]   Heme: Bleeding problems [ ] ; Clotting disorders [ ] ; Anemia [ ]   Endocrine: Diabetes [ ] ; Thyroid dysfunction[y ]  Past Medical History:  Diagnosis Date   CAD (coronary artery disease), s/p stent 2011 and CABG 2015 [facility])    Hodgkin disease [facility]) 1997   Hyperlipidemia    Macular degeneration    PVC's (premature ventricular contractions)    Current Outpatient Medications  Medication Sig Dispense Refill   amiodarone (PACERONE) 200 MG tablet Take 1 tablet (200 mg total) by mouth 2 (two) times daily. 60 tablet 5   ARIPiprazole (ABILIFY) 5 MG tablet Take 1 tablet (5 mg total) by mouth daily. 90 tablet 1   aspirin 81 MG tablet Take 81 mg by mouth daily.     BESIVANCE 0.6 % SUSP Place 1 drop into the left eye See admin instructions. Instill 1 drop into left eye 4 times daily the day of and the day after eye injection as needed.  1   Bevacizumab (AVASTIN IV) Inject 1 Dose into the eye every 6 (six) weeks.     carvedilol (COREG) 6.25 MG tablet Take 1 tablet by mouth twice daily. 180 tablet 3   escitalopram (LEXAPRO) 20 MG tablet Take 1 tablet (20 mg total) by mouth daily. 90 tablet 1   LORazepam (ATIVAN) 0.5 MG tablet Take 1 tablet (0.5 mg total) by mouth 2 (two) times daily. (Patient taking differently: Take 0.5 mg by mouth 2 (two) times daily as needed (anxiety.).) 60 tablet 1   metFORMIN (GLUCOPHAGE-XR) 750 MG 24 hr tablet Take 1 tablet (750 mg total) by mouth daily with breakfast. 30 tablet 0   Multiple Vitamins-Minerals (PRESERVISION AREDS 2 PO) Take 2 tablets by mouth in the morning.     nitroGLYCERIN (NITROSTAT) 0.4 MG SL tablet Place 1 tablet (0.4 mg total) under the tongue every 5  (five) minutes as needed for chest pain. 25 tablet 6   rosuvastatin (CRESTOR) 20 MG tablet Take 1 tablet by mouth daily. **Please call to schedule office visit prior to further refills** 90 tablet 3   sacubitril-valsartan (ENTRESTO) 49-51 MG Take 1 tablet by mouth 2 (two) times daily. 60 tablet 5   spironolactone (ALDACTONE) 25 MG tablet Take 1 tablet (25 mg total) by mouth daily. 30 tablet 5   torsemide (DEMADEX) 20 MG tablet Take 2 tablets (40 mg total) by mouth daily. 60 tablet 5   ursodiol (ACTIGALL) 500 MG tablet Take 500 mg by mouth 2 (two) times daily.     Current Facility-Administered Medications  Medication Dose Route Frequency Provider Last Rate Last Admin   sodium chloride flush (NS) 0.9 % injection 3 mL  3 mL Intravenous Q12H Lorretta Harp, MD       No Known Allergies  Social History   Socioeconomic History   Marital status: Married    Spouse name: Not on file   Number of children: Not on file   Years of education: Not on file   Highest education level: Not on file  Occupational History   Not on file  Tobacco Use   Smoking status: Never   Smokeless tobacco: Former   Tobacco comments:    a long time ago  Vaping Use   Vaping Use: Never used  Substance and Sexual Activity   Alcohol use: Yes    Alcohol/week: 3.0 standard drinks    Types: 3 Cans of beer per week    Comment: sometimes 3 beers daily   Drug use: No   Sexual activity: Yes  Other Topics Concern   Not on file  Social History Narrative   Not on file   Social Determinants of Health   Financial Resource Strain: Not on file  Food Insecurity: Not on file  Transportation Needs: Not on file  Physical Activity: Not on file  Stress: Not on file  Social Connections: Not on file  Intimate Partner Violence: Not on file   Family History  Problem Relation Age of Onset   Stroke Mother    Cancer Mother    COPD Mother    Heart disease Father    COPD Father    Heart attack Father    Heart Problems  Brother        heart valve replaced at 35 years old   Heart Problems Brother        MI at age 53; CABG at age 38   Cancer Maternal Grandmother    Colon cancer Neg Hx    BP (!) 84/66    Pulse (!) 45    Wt 63.8 kg (140 lb 9.6 oz)    SpO2 94%    BMI 21.70 kg/m   Wt Readings from Last 3 Encounters:  08/14/21 63.8 kg (140 lb 9.6 oz)  08/06/21 62.1 kg (136 lb 14.5 oz)  07/24/21 71.8 kg (158 lb 3.2 oz)   PHYSICAL EXAM: General:  NAD. No resp difficulty, thin HEENT: Normal Neck: Supple. No JVD. Carotids 2+ bilat; no bruits. No lymphadenopathy or thryomegaly appreciated. Cor: PMI nondisplaced. Brady irregular & rhythm. No rubs, gallops or murmurs. Lungs: Clear Abdomen: Soft, nontender, nondistended. No hepatosplenomegaly. No bruits or masses. Good bowel sounds. Extremities: No cyanosis, clubbing, rash, edema Neuro: Alert & oriented x 3, cranial nerves grossly intact. Moves all 4 extremities w/o difficulty. Affect pleasant.  ECG: SB with 1st degree AVB PR 260 msec, frequent PVCs, QTc 487 msec (personally reviewed).  ASSESSMENT & PLAN: 1. Chronic Systolic Heart Failure  - prior h/o ischemic CM, that improved post revascularization. - EF 30-35% at time of CABG in 2015, EF normalized on repeat Echos in 2016 and 2017 (50-55%) - Echo (1/23): EF back down, 30-35%, RV mildly reduced.  - R/LHC (1/23) w/ occluded SVG-RCA but distal RCA filled by L>R collaterals. All other grafts patent. EF out of proportion to degree of CAD. RHC w/ elevated Rt sided filling pressures w/ fairly normal PCWP ~17. CO marginal, CI 2.0.  - Suspect PVC - mediated CM (frequent PVCs noted on tele ~25/min on admit). Prior Zio w/ 13% PVC burden - cMRI (1/23) c/w ICM w/ scar.  - NYHA II. Volume looks good today. - With low BP, decrease  Entresto to 24/26 mg bid. - Decrease spiro to 12.5 mg daily (recent K 5.4). - Start Farxiga 10 mg daily. - Decrease torsemide to 20 mg daily. - Continue Coreg 6.25 mg bid. - some  dyssynchrony/ paradoxical septal motion noted on echo but QRS <150 ms. Not candidate for CRT-D.   - Will need repeat echo in 2-3 months after suppression of PVCs.  - Labs 08/13/21 reviewed; SCr 5.4, creatinine 1.84. BMET in 1 week. (Given Rx to have drawn at [facility]).   2. PVCs - High PVC burden this admit, Holter 5/21 showed 13% burden.  - Suspect contributing to CM. - No coronary ischemia on cath.  - ECG today with frequent PVCs, QTc 487 msec. - Continue amiodarone 200 mg bid.  - Needs to wear CPAP. - Repeat 14 day Zio to quantify.  - Restart mexiletine 200 mg bid and refer to EP for PVC ablation consideration. Discussed with Dr. Haroldine Laws. - Limit ETOH. - Check Mag today.   3. CAD - remote MI 2011, s/p PCI to pLAD - CABG x 4 in 2015 - LHC (1/23): w/ 3/4 patent grafts, occluded SVG- RCA w/ L>R collaterals.  - No CP.  - Continue ASA + statin - Continue Coreg 6.25 mg bid (home dose, avoid up titration w/ marginal output)    4. Pericardial effusion - Large posterior effusion on MRI without tamponade.  - Continue w/ diuretics. - Repeat echo in 3-4 weeks to reassess, if persistent will plan tap.    5. Hypertension  - Now with low BP. - Med changes as above.   6. OSA - Reports poor compliance w/ cpap.  - Update home sleep study.   7. H/o Hypokalemia/ Hypomagnesemia - Continue spiro. - Labs today.  Follow up in 2-3 months with Dr. Haroldine Laws + echo.   He is planning trip to [place] in 2 weeks. Discussed with DB, and would avoid international travel.  Allena Katz, FNP-BC 08/14/21

## 2021-08-14 NOTE — Telephone Encounter (Signed)
Called patient to advise Dr. Haroldine Laws does not recommend international travel right now with his current medical issues. Unable to leave message, will try again.  Allena Katz, FNP-BC 08/14/21

## 2021-08-14 NOTE — Patient Instructions (Signed)
Medication Changes:  Decrease Entresto to 24/26 mg Twice daily   Start Mexilitine 200 mg Twice daily   Start Farxiga 10 mg Daily  Your provider has prescribed Wilder Glade for you. Please be aware the most common side effect of this medication is urinary tract infections and yeast infections. Please practice good hygiene and keep this area clean and dry to help prevent this. If you do begin to have symptoms of these infections, such as difficulty urinating or painful urination,  please let us know.  Lab Work:  Labs done today, your results will be available in MyChart, we will contact you for abnormal readings.  Your physician recommends that you return for lab work in: 1-2 weeks, we have given you a prescription to have this done at [facility] near your home  Testing/Procedures:  Your physician has requested that you have an echocardiogram. Echocardiography is a painless test that uses sound waves to create images of your heart. It provides your doctor with information about the size and shape of your heart and how well your hearts chambers and valves are working. This procedure takes approximately one hour. There are no restrictions for this procedure. IN 3-4 WEEKS AND AGAIN IN 3 MONTHS  Your provider has recommended that  you wear a Zio Patch for 14 days.  This monitor will record your heart rhythm for our review.  IF you have any symptoms while wearing the monitor please press the button.  If you have any issues with the patch or you notice a red or orange light on it please call the company at [phone].  Once you remove the patch please mail it back to the company as soon as possible so we can get the results.  Your provider has recommended that you have a home sleep study.  We have provided you with the equipment in our office today. PLEASE DO NOT OPEN THE BOX AND COMPLETE THE TEST UNTIL WE ADVISE YOU TO DO SO, THIS IS THAT WE CAN ENSURE YOUR INSURANCE WILL PAY FOR THIS TEST. Please  download the app and follow the instructions. YOUR PIN NUMBER IS: [id number]. Once you have completed the test you just dispose of the equipment, the information is automatically uploaded to [place] via blue-tooth technology. If your test is positive for sleep apnea and you need a home CPAP machine you will be contacted by Dr Theodosia Blender office [facility]) to set this up.  Referrals:  You have been referred to EP, who specializes in PVC's, they will call you for an appointment   Special Instructions // Education:  Do the following things EVERYDAY: Weigh yourself in the morning before breakfast. Write it down and keep it in a log. Take your medicines as prescribed Eat low salt foods--Limit salt (sodium) to 2000 mg per day.  Stay as active as you can everyday Limit all fluids for the day to less than 2 liters   Follow-Up in: 3 months with Dr Haroldine Laws with an echocardiogram  At the [facility], you and your health needs are our priority. We have a designated team specialized in the treatment of Heart Failure. This Care Team includes your primary Heart Failure Specialized Cardiologist (physician), Advanced Practice Providers (APPs- Physician Assistants and Nurse Practitioners), and Pharmacist who all work together to provide you with the care you need, when you need it.   You may see any of the following providers on your designated Care Team at your next follow up:  Dr Glori Bickers  Dr Loralie Champagne Darrick Grinder, NP Lyda Jester, [place] [facility] [place], [place] Audry Riles, PharmD   Please be sure to bring in all your medications bottles to every appointment.   Need to Contact us:  If you have any questions or concerns before your next appointment please send [place] a message through [place] or call our office at [phone].    TO LEAVE A MESSAGE FOR THE NURSE SELECT OPTION 2, PLEASE LEAVE A MESSAGE INCLUDING: YOUR NAME DATE OF BIRTH CALL BACK NUMBER REASON  FOR CALL**this is important as we prioritize the call backs  YOU WILL RECEIVE A CALL BACK THE SAME DAY AS LONG AS YOU CALL BEFORE 4:00 PM

## 2021-08-14 NOTE — Telephone Encounter (Signed)
Patient advised and verbalized understanding. Med list updated to reflect changes.   Meds ordered this encounter  Medications   spironolactone (ALDACTONE) 25 MG tablet    Sig: Take 0.5 tablets (12.5 mg total) by mouth daily.    Dispense:  15 tablet    Refill:  5    Please cancel all previous orders for current medication. Change in dosage or pill size.

## 2021-08-14 NOTE — Telephone Encounter (Signed)
-----   Message from Rafael Bihari, [place] sent at 08/14/2021 11:51 AM EST ----- Magnesium is OK.  Please have him reduce his spiro to 12.5 mg daily (He had mildly elevated K on labs from yesterday. This will also help his BP to come up some.)  He was given Rx for repeat labs at [facility]

## 2021-08-18 ENCOUNTER — Encounter: Payer: Self-pay | Admitting: Cardiovascular Disease

## 2021-08-18 ENCOUNTER — Other Ambulatory Visit: Payer: Self-pay

## 2021-08-18 ENCOUNTER — Ambulatory Visit: Payer: BC Managed Care – PPO | Admitting: Cardiovascular Disease

## 2021-08-18 ENCOUNTER — Ambulatory Visit: Payer: BC Managed Care – PPO | Admitting: Pharmacist Clinician (PhC)/ Clinical Pharmacy Specialist

## 2021-08-18 DIAGNOSIS — I5020 Unspecified systolic (congestive) heart failure: Secondary | ICD-10-CM | POA: Diagnosis not present

## 2021-08-18 DIAGNOSIS — G4733 Obstructive sleep apnea (adult) (pediatric): Secondary | ICD-10-CM

## 2021-08-18 DIAGNOSIS — E782 Mixed hyperlipidemia: Secondary | ICD-10-CM

## 2021-08-18 DIAGNOSIS — Z9989 Dependence on other enabling machines and devices: Secondary | ICD-10-CM

## 2021-08-18 DIAGNOSIS — I519 Heart disease, unspecified: Secondary | ICD-10-CM

## 2021-08-18 DIAGNOSIS — I493 Ventricular premature depolarization: Secondary | ICD-10-CM

## 2021-08-18 DIAGNOSIS — I10 Essential (primary) hypertension: Secondary | ICD-10-CM

## 2021-08-18 DIAGNOSIS — Z951 Presence of aortocoronary bypass graft: Secondary | ICD-10-CM | POA: Diagnosis not present

## 2021-08-18 MED ORDER — CARVEDILOL 3.125 MG PO TABS: ORAL_TABLET | ORAL | 3 refills | Status: DC

## 2021-08-18 NOTE — Patient Instructions (Signed)
° °  Follow-Up: At [facility], you and your health needs are our priority.  As part of our continuing mission to provide you with exceptional heart care, we have created designated Provider Care Teams.  These Care Teams include your primary Cardiologist (physician) and Advanced Practice Providers (APPs -  Physician Assistants and Nurse Practitioners) who all work together to provide you with the care you need, when you need it.  We recommend signing up for the patient portal called "MyChart".  Sign up information is provided on this After Visit Summary.  MyChart is used to connect with patients for Virtual Visits (Telemedicine).  Patients are able to view lab/test results, encounter notes, upcoming appointments, etc.  Non-urgent messages can be sent to your provider as well.   To learn more about what you can do with MyChart, go to [email or web address].    Your next appointment:   6 month(s)  The format for your next appointment:   In Person  Provider:   Quay Burow, MD

## 2021-08-18 NOTE — Assessment & Plan Note (Signed)
Patient with recently diagnosed HFrEF, at 25% by echo in January.  He is currently on GDMT, however BP readings still soft and patient having some orthostatic symptoms.  Will decrease carvedilol to 3.125 mg twice daily and continue with all other medications.  If he continues to have problems could consider switching to metoprolol succ 12.5 mg, as there is less BP impact, or decreasing spironolactone further to 12.5 mg qod.  He will follow up with Dr. Gwenlyn Found and Bensimhon, as well as Dr. Claiborne Billings for OSA.

## 2021-08-18 NOTE — Progress Notes (Signed)
08/18/2021 Shane [name] 1966/03/07 [id number]   HPI:  Shane [name] is a 56 y.o. male patient of Dr Gwenlyn Found, with a PMH below who presents today for hypertension clinic evaluation.  He was seen by Dr. Gwenlyn Found last month, and while his blood pressure in the office was just above goal, he noted that home readings had been trending upward.  He was started on amlodipine 5 mg daily and asked to follow up with CVRR after a month.  Earlier this week he sent a message to the office informing Dr. Gwenlyn Found that his ankles were swollen to the point of being painful.  He was told to discontinue amlodipine and keep appointment for Dec 30.  At that time office BP was good, however home readings were trending upward.  He was started on olmesartan 20 mg.  Was seen by Dr. Gwenlyn Found about 2 weeks later, diastolic pressure still at 86, but systolic good at 631.  At that time he was noted to have HFrEF, with EF at 30-35% and was admitted just 10 days after that appointment for an acute exacerbation after heart catheterization.  He was most recently seen in the Heart Failure clinic, at which time BP was 84/66.  Entresto (started at hospital) was decreased to 24/26 mg bid and spironolactone to 12.5 (K 5.4).  Torsemide was also decreased to 20 mg daily and Farxiga 10 mg was added.     Today he returns for follow up.  Has been feeling well for the past week or two.  Notes that he lost about 10 pounds of fluid with hospitalization.  Breathing is much improved.  He does note some orthostatic symptoms if he stands up to fast, but is otherwise not bothered by his lower blood pressure readings.    Past Medical History: hyperlipidemia 3/22 LDL 48 on rosuvastatin 20  CAD S/p STEMI (2011), CABG x 4 (2015)  PVC's Asymptomatic now, on carvedilol 6.25 bid  OSA Recently seen by Dr. Claiborne Billings for potential treatment, does not tolerate CPAP  Primary biliary cirrhosis Newer dx, being evaluated at [facility]     Blood Pressure Goal:   130/80  Current Medications: Entresto 24/26 mg bid, carvedilol 6.25 mg bid, Farxiga 10 mg qd, spironolactone 12.5 mg qd   Family Hx: father  with CABG; father died at 45; mother no heart disease died at 51; older brother with vlave replaced, younger with CABG at  (first MI at 23); 2 sons 20,21 no issues yet  Social Hx: no tobacco, no alcohol, coffee home brew 1 cup per day  Diet: Appetite good, paying more attention to what he's eating - wife very cautious - friend made freezer meals for them.  Pasta w/ tomato sauce (ground chicken), shrimp fettucini, shrimp scampi w/ rice and veggies, eggplant parmesan (light cheese)   Exercise: none  Home BP readings: 9 readings since leaving hospital  Average 91/65 (range 85-103/55/72)  Intolerances:  nkda  Labs: 08/13/21:  Na 134, K 5.4, Glu 75, BUN 73, SCr 1.84 GFR 42   Wt Readings from Last 3 Encounters:  08/18/21 140 lb 12.8 oz (63.9 kg)  08/14/21 140 lb 9.6 oz (63.8 kg)  08/06/21 136 lb 14.5 oz (62.1 kg)   BP Readings from Last 3 Encounters:  08/18/21 90/60  08/14/21 (!) 84/66  08/06/21 110/87   Pulse Readings from Last 3 Encounters:  08/18/21 (!) 54  08/14/21 (!) 45  08/06/21 66    Current Outpatient Medications  Medication Sig Dispense  Refill   acetaminophen (TYLENOL) 500 MG tablet Take 500 mg by mouth every 6 (six) hours as needed.     amiodarone (PACERONE) 200 MG tablet Take 1 tablet (200 mg total) by mouth 2 (two) times daily. 60 tablet 5   ARIPiprazole (ABILIFY) 5 MG tablet Take 1 tablet (5 mg total) by mouth daily. 90 tablet 1   aspirin 81 MG tablet Take 81 mg by mouth daily.     BESIVANCE 0.6 % SUSP Place 1 drop into the left eye See admin instructions. Instill 1 drop into left eye 4 times daily the day of and the day after eye injection as needed.  1   Bevacizumab (AVASTIN IV) Inject 1 Dose into the eye every 6 (six) weeks.     carvedilol (COREG) 6.25 MG tablet Take 1 tablet by mouth twice daily. 180 tablet 3    dapagliflozin propanediol (FARXIGA) 10 MG TABS tablet Take 1 tablet (10 mg total) by mouth daily before breakfast. 30 tablet 6   escitalopram (LEXAPRO) 20 MG tablet Take 1 tablet (20 mg total) by mouth daily. 90 tablet 1   LORazepam (ATIVAN) 0.5 MG tablet Take 1 tablet (0.5 mg total) by mouth 2 (two) times daily. (Patient taking differently: Take 0.5 mg by mouth 2 (two) times daily as needed (anxiety.).) 60 tablet 1   metFORMIN (GLUCOPHAGE-XR) 750 MG 24 hr tablet Take 1 tablet (750 mg total) by mouth daily with breakfast. 30 tablet 0   mexiletine (MEXITIL) 200 MG capsule Take 1 capsule (200 mg total) by mouth 2 (two) times daily. 60 capsule 3   Multiple Vitamins-Minerals (PRESERVISION AREDS 2 PO) Take 2 tablets by mouth in the morning.     nitroGLYCERIN (NITROSTAT) 0.4 MG SL tablet Place 1 tablet (0.4 mg total) under the tongue every 5 (five) minutes as needed for chest pain. 25 tablet 6   rosuvastatin (CRESTOR) 20 MG tablet Take 1 tablet by mouth daily. **Please call to schedule office visit prior to further refills** 90 tablet 3   sacubitril-valsartan (ENTRESTO) 24-26 MG Take 1 tablet by mouth 2 (two) times daily. 60 tablet 3   spironolactone (ALDACTONE) 25 MG tablet Take 0.5 tablets (12.5 mg total) by mouth daily. 15 tablet 5   torsemide (DEMADEX) 20 MG tablet Take 2 tablets (40 mg total) by mouth daily. 60 tablet 5   ursodiol (ACTIGALL) 500 MG tablet Take 500 mg by mouth 2 (two) times daily.     Current Facility-Administered Medications  Medication Dose Route Frequency Provider Last Rate Last Admin   sodium chloride flush (NS) 0.9 % injection 3 mL  3 mL Intravenous Q12H Lorretta Harp, MD        No Known Allergies  Past Medical History:  Diagnosis Date   CAD (coronary artery disease), s/p stent 2011 and CABG 2015 [facility])    Hodgkin disease [facility]) 1997   Hyperlipidemia    Macular degeneration    PVC's (premature ventricular contractions)     Blood pressure 90/60, pulse  (!) 54, resp. rate 16, height 5' 7.4" (1.712 m), weight 140 lb 12.8 oz (63.9 kg), SpO2 99 %.  HFrEF (heart failure with reduced ejection fraction) ([place]) Patient with recently diagnosed HFrEF, at 25% by echo in January.  He is currently on GDMT, however BP readings still soft and patient having some orthostatic symptoms.  Will decrease carvedilol to 3.125 mg twice daily and continue with all other medications.  If he continues to have problems could consider switching  to metoprolol succ 12.5 mg, as there is less BP impact, or decreasing spironolactone further to 12.5 mg qod.  He will follow up with Dr. Gwenlyn Found and Bensimhon, as well as Dr. Claiborne Billings for OSA.     Tommy Medal PharmD CPP [facility] [address] [place], [place] [address] [phone]

## 2021-08-18 NOTE — Assessment & Plan Note (Signed)
History of hyperlipidemia on statin therapy with lipid profile performed 10/09/2020 revealing total cholesterol 103, LDL 48 and HDL 45.

## 2021-08-18 NOTE — Assessment & Plan Note (Signed)
History of CAD status post anterior myocardial infarction 08/28/2009 with a Xience DES stent placed in the proximal LAD.  He ultimately underwent CABG 02/27/2014 with a LIMA to his LAD, vein to a ramus branch, obtuse marginal branch and RCA.  His EF at that time was 35%.  He had a Myoview stress test performed 06/28/2016 which was low risk and nonischemic.  Because of progressive symptoms of heart failure with a decline in his EF and moderate MR and TR I ultimately performed right and left heart cath on him 08/03/2021 revealing an occluded RCA with occluded vein to the RCA, patent LIMA to the LAD, vein to the ramus branch and to an obtuse marginal branch.  He did have left-to-right collaterals.

## 2021-08-18 NOTE — Assessment & Plan Note (Signed)
History of obstructive sleep apnea on CPAP. 

## 2021-08-18 NOTE — Assessment & Plan Note (Signed)
History of LV dysfunction with an EF in the 25% range.  Demonstrated on echo 08/05/2021.  There was a question of loculated pericardial effusion inferior to the left ventricle that did not not appear to be impinging on the right ventricle or causing tamponade.  This is going to be followed by an upcoming 2D echocardiogram.

## 2021-08-18 NOTE — Progress Notes (Signed)
08/18/2021 Shane [name]   Nov 07, 1965  [id number]  Primary Physician Shane Arnt, MD Primary Cardiologist: Shane Harp MD Shane [name], [place]  HPI:  Shane [name] is a 56 y.o.  thin appearing married Caucasian male father of 2 children [name] is also a patient of mine who accompanies him today.    I last saw him in the office 07/24/2021.  He works as an Sales promotion account executive. His primary care physician is Dr. Cecille [name]. Marland Kitchen His cardiac risk factor profile is remarkable for hyperlipidemia and family history the father who had bypass surgery at age 58 and a brother who had bypass surgery as well. He suffered an anterior wall myocardial infarction 08/28/09 with a Xience DES stent placed in his proximal LAD. Because of recurrent symptoms he underwent catheterization in August and ultimately coronary bypass grafting 02/27/14 with a LIMA to his LAD, a vein to ramus branch, obtuse marginal branch and the RCA. His ejection fraction was 35%. He recuperated nicely although he did not participate in cardiac rehabilitation. A 2-D echocardiogram performed 08/27/14 revealed an improvement in his ejection fraction up to 50-55% with anterolateral wall motion abnormality. While honeymooning in [place] he noticed prolonged recovery from exercise activity as well as "just not feeling right". He denies chest pain. I performed a Myoview stress test on him after that on 06/28/16 which was low risk and nonischemic and 2-D echo that revealed preserved LV function with EF of 50-55%. Those symptoms have since subsided.   He has been diagnosed with primary biliary's cirrhosis and is undergoing evaluation at [facility].  He apparently had a recent liver biopsy.  He has lost 12 pounds since I saw him.  He complains of nausea and has been unable to eat a normal diet.  He was complaining of fairly new onset dyspnea on exertion.  I did get a 2D echocardiogram that shows a new decline  in LV function to 30 to 35% with mild to moderate MR, moderate TR.  His last echo in our system 07/02/2016 revealed a normal EF and normal valvular function.  I performed right and left heart cath on him 08/03/2021 revealing an occluded dominant RCA with occluded vein graft to the right, left-to-right collaterals and otherwise patent grafts.  He did have elevated filling pressures with a right atrial pressure of 17 and elevated V wave.  He was admitted by advanced heart failure service and consulted on by Dr. Haroldine [name].  He was placed on mexiletine for his high PVC burden and his heart failure medications were optimized.  He diuresed significantly and he is now 18 pounds lower than he was when I first saw him and feels clinically improved.   Current Meds  Medication Sig   acetaminophen (TYLENOL) 500 MG tablet Take 500 mg by mouth every 6 (six) hours as needed.   amiodarone (PACERONE) 200 MG tablet Take 1 tablet (200 mg total) by mouth 2 (two) times daily.   ARIPiprazole (ABILIFY) 5 MG tablet Take 1 tablet (5 mg total) by mouth daily.   aspirin 81 MG tablet Take 81 mg by mouth daily.   BESIVANCE 0.6 % SUSP Place 1 drop into the left eye See admin instructions. Instill 1 drop into left eye 4 times daily the day of and the day after eye injection as needed.   Bevacizumab (AVASTIN IV) Inject 1 Dose into the eye every 6 (six) weeks.   dapagliflozin propanediol (FARXIGA) 10  MG TABS tablet Take 1 tablet (10 mg total) by mouth daily before breakfast.   escitalopram (LEXAPRO) 20 MG tablet Take 1 tablet (20 mg total) by mouth daily.   LORazepam (ATIVAN) 0.5 MG tablet Take 1 tablet (0.5 mg total) by mouth 2 (two) times daily. (Patient taking differently: Take 0.5 mg by mouth 2 (two) times daily as needed (anxiety.).)   metFORMIN (GLUCOPHAGE-XR) 750 MG 24 hr tablet Take 1 tablet (750 mg total) by mouth daily with breakfast.   mexiletine (MEXITIL) 200 MG capsule Take 1 capsule (200 mg total) by mouth 2 (two)  times daily.   Multiple Vitamins-Minerals (PRESERVISION AREDS 2 PO) Take 2 tablets by mouth in the morning.   nitroGLYCERIN (NITROSTAT) 0.4 MG SL tablet Place 1 tablet (0.4 mg total) under the tongue every 5 (five) minutes as needed for chest pain.   rosuvastatin (CRESTOR) 20 MG tablet Take 1 tablet by mouth daily. **Please call to schedule office visit prior to further refills**   sacubitril-valsartan (ENTRESTO) 24-26 MG Take 1 tablet by mouth 2 (two) times daily.   spironolactone (ALDACTONE) 25 MG tablet Take 0.5 tablets (12.5 mg total) by mouth daily.   torsemide (DEMADEX) 20 MG tablet Take 2 tablets (40 mg total) by mouth daily.   ursodiol (ACTIGALL) 500 MG tablet Take 500 mg by mouth 2 (two) times daily.   [DISCONTINUED] carvedilol (COREG) 6.25 MG tablet Take 1 tablet by mouth twice daily.     No Known Allergies  Social History   Socioeconomic History   Marital status: Married    Spouse name: Not on file   Number of children: Not on file   Years of education: Not on file   Highest education level: Not on file  Occupational History   Not on file  Tobacco Use   Smoking status: Never   Smokeless tobacco: Former   Tobacco comments:    a long time ago  Vaping Use   Vaping Use: Never used  Substance and Sexual Activity   Alcohol use: Yes    Alcohol/week: 3.0 standard drinks    Types: 3 Cans of beer per week    Comment: sometimes 3 beers daily   Drug use: No   Sexual activity: Yes  Other Topics Concern   Not on file  Social History Narrative   Not on file   Social Determinants of Health   Financial Resource Strain: Not on file  Food Insecurity: Not on file  Transportation Needs: Not on file  Physical Activity: Not on file  Stress: Not on file  Social Connections: Not on file  Intimate Partner Violence: Not on file     Review of Systems: General: negative for chills, fever, night sweats or weight changes.  Cardiovascular: negative for chest pain, dyspnea on  exertion, edema, orthopnea, palpitations, paroxysmal nocturnal dyspnea or shortness of breath Dermatological: negative for rash Respiratory: negative for cough or wheezing Urologic: negative for hematuria Abdominal: negative for nausea, vomiting, diarrhea, bright red blood per rectum, melena, or hematemesis Neurologic: negative for visual changes, syncope, or dizziness All other systems reviewed and are otherwise negative except as noted above.    Blood pressure 110/64, pulse (!) 54, height 5\' 7"  (1.702 m), weight 140 lb 12.8 oz (63.9 kg), SpO2 97 %.  General appearance: alert and no distress Neck: no adenopathy, no carotid bruit, no JVD, supple, symmetrical, trachea midline, and thyroid not enlarged, symmetric, no tenderness/mass/nodules Lungs: clear to auscultation bilaterally Heart: regular rate and rhythm, S1, S2 normal,  no murmur, click, rub or gallop Extremities: extremities normal, atraumatic, no cyanosis or edema Pulses: 2+ and symmetric Skin: Skin color, texture, turgor normal. No rashes or lesions Neurologic: Grossly normal  EKG not performed today  ASSESSMENT AND PLAN:   Mixed hyperlipidemia History of hyperlipidemia on statin therapy with lipid profile performed 10/09/2020 revealing total cholesterol 103, LDL 48 and HDL 45.  PVC's (premature ventricular contractions) History of frequent PVCs (13% burden) currently on amiodarone and mexiletine per Dr. Haroldine [name] who thought that this may be contributing to his LV dysfunction.  S/P CABG (coronary artery bypass graft) History of CAD status post anterior myocardial infarction 08/28/2009 with a Xience DES stent placed in the proximal LAD.  He ultimately underwent CABG 02/27/2014 with a LIMA to his LAD, vein to a ramus branch, obtuse marginal branch and RCA.  His EF at that time was 35%.  He had a Myoview stress test performed 06/28/2016 which was low risk and nonischemic.  Because of progressive symptoms of heart failure with a  decline in his EF and moderate MR and TR I ultimately performed right and left heart cath on him 08/03/2021 revealing an occluded RCA with occluded vein to the RCA, patent LIMA to the LAD, vein to the ramus branch and to an obtuse marginal branch.  He did have left-to-right collaterals.  OSA on CPAP History of obstructive sleep apnea on CPAP  Essential hypertension History of essential hypertension although now he is somewhat hypotensive with a blood pressure of 110/64.  He is on carvedilol in addition to [facility] and spironolactone.  I am going to decrease his carvedilol from 6.25 to 3.125 mg p.o. twice daily.  LV dysfunction History of LV dysfunction with an EF in the 25% range.  Demonstrated on echo 08/05/2021.  There was a question of loculated pericardial effusion inferior to the left ventricle that did not not appear to be impinging on the right ventricle or causing tamponade.  This is going to be followed by an upcoming 2D echocardiogram.     Shane Harp MD [facility], [facility] 08/18/2021 10:18 AM

## 2021-08-18 NOTE — Assessment & Plan Note (Addendum)
History of essential hypertension although now he is somewhat hypotensive with a blood pressure of 110/64.  He is on carvedilol in addition to [facility] and spironolactone.  I am going to decrease his carvedilol from 6.25 to 3.125 mg p.o. twice daily.

## 2021-08-18 NOTE — Patient Instructions (Signed)
Check your blood pressure at home daily and keep record of the readings.  Take your BP meds as follows:  Decrease carvedilol to 3.125 mg twice daily  Continue with all other medications.   Bring all of your meds, your BP cuff and your record of home blood pressures to your next appointment.  Exercise as youre able, try to walk approximately 30 minutes per day.  Keep salt intake to a minimum, especially watch canned and prepared boxed foods.  Eat more fresh fruits and vegetables and fewer canned items.  Avoid eating in fast food restaurants.    HOW TO TAKE YOUR BLOOD PRESSURE: Rest 5 minutes before taking your blood pressure.  Dont smoke or drink caffeinated beverages for at least 30 minutes before. Take your blood pressure before (not after) you eat. Sit comfortably with your back supported and both feet on the floor (dont cross your legs). Elevate your arm to heart level on a table or a desk. Use the proper sized cuff. It should fit smoothly and snugly around your bare upper arm. There should be enough room to slip a fingertip under the cuff. The bottom edge of the cuff should be 1 inch above the crease of the elbow. Ideally, take 3 measurements at one sitting and record the average.

## 2021-08-18 NOTE — Assessment & Plan Note (Signed)
History of frequent PVCs (13% burden) currently on amiodarone and mexiletine per Dr. Haroldine Laws who thought that this may be contributing to his LV dysfunction.

## 2021-08-24 ENCOUNTER — Encounter (HOSPITAL_COMMUNITY): Payer: Self-pay | Admitting: Internal Medicine

## 2021-08-25 ENCOUNTER — Encounter: Payer: Self-pay | Admitting: Pharmacist Clinician (PhC)/ Clinical Pharmacy Specialist

## 2021-08-27 ENCOUNTER — Other Ambulatory Visit: Payer: Self-pay | Admitting: Family Medicine

## 2021-08-27 ENCOUNTER — Other Ambulatory Visit (HOSPITAL_COMMUNITY): Payer: Self-pay

## 2021-08-27 ENCOUNTER — Encounter: Payer: Self-pay | Admitting: Cardiovascular Disease

## 2021-08-27 ENCOUNTER — Other Ambulatory Visit: Payer: Self-pay | Admitting: Internal Medicine

## 2021-08-27 DIAGNOSIS — F324 Major depressive disorder, single episode, in partial remission: Secondary | ICD-10-CM

## 2021-08-27 DIAGNOSIS — E8881 Metabolic syndrome: Secondary | ICD-10-CM

## 2021-08-27 DIAGNOSIS — E038 Other specified hypothyroidism: Secondary | ICD-10-CM

## 2021-08-27 DIAGNOSIS — R0683 Snoring: Secondary | ICD-10-CM

## 2021-08-27 DIAGNOSIS — G4730 Sleep apnea, unspecified: Secondary | ICD-10-CM

## 2021-08-27 DIAGNOSIS — I5022 Chronic systolic (congestive) heart failure: Secondary | ICD-10-CM | POA: Diagnosis not present

## 2021-08-27 DIAGNOSIS — I25700 Atherosclerosis of coronary artery bypass graft(s), unspecified, with unstable angina pectoris: Secondary | ICD-10-CM

## 2021-08-27 DIAGNOSIS — Z951 Presence of aortocoronary bypass graft: Secondary | ICD-10-CM

## 2021-08-27 MED ORDER — METFORMIN HCL ER 750 MG PO TB24
750.0000 mg | ORAL_TABLET | Freq: Every day | ORAL | 0 refills | Status: DC
  Filled 2021-08-27: qty 30, 30d supply, fill #0

## 2021-08-28 LAB — BASIC METABOLIC PANEL
BUN/Creatinine Ratio: 29 — ABNORMAL HIGH (ref 9–20)
BUN: 50 mg/dL — ABNORMAL HIGH (ref 6–24)
CO2: 27 mmol/L (ref 20–29)
Calcium: 10.3 mg/dL — ABNORMAL HIGH (ref 8.7–10.2)
Chloride: 91 mmol/L — ABNORMAL LOW (ref 96–106)
Creatinine, Ser: 1.73 mg/dL — ABNORMAL HIGH (ref 0.76–1.27)
Glucose: 170 mg/dL — ABNORMAL HIGH (ref 70–99)
Potassium: 4.9 mmol/L (ref 3.5–5.2)
Sodium: 133 mmol/L — ABNORMAL LOW (ref 134–144)
eGFR: 46 mL/min/{1.73_m2} — ABNORMAL LOW (ref >59)

## 2021-09-03 ENCOUNTER — Telehealth (HOSPITAL_COMMUNITY): Payer: Self-pay | Admitting: Surgery

## 2021-09-03 NOTE — Telephone Encounter (Signed)
I called patient to inform him that it was okay to proceed with ordered home sleep study and that insurance prior authorization is not required per [place].  I briefly reviewed the instructions and he says that he will complete within the next several nights.

## 2021-09-03 NOTE — Telephone Encounter (Signed)
I called patient to inform him that it was okay to proceed with ordered home sleep study and that insurance prior authorization is not required per [place].  I briefly reviewed the instructions and he says that he will complete within the next several nights.

## 2021-09-04 ENCOUNTER — Inpatient Hospital Stay (HOSPITAL_COMMUNITY): Admission: RE | Admit: 2021-09-04 | Payer: BC Managed Care – PPO | Source: Ambulatory Visit

## 2021-09-04 ENCOUNTER — Encounter (INDEPENDENT_AMBULATORY_CARE_PROVIDER_SITE_OTHER): Payer: BC Managed Care – PPO | Admitting: Cardiology

## 2021-09-04 DIAGNOSIS — I5020 Unspecified systolic (congestive) heart failure: Secondary | ICD-10-CM

## 2021-09-05 ENCOUNTER — Other Ambulatory Visit (HOSPITAL_COMMUNITY): Payer: Self-pay

## 2021-09-05 ENCOUNTER — Other Ambulatory Visit: Payer: Self-pay | Admitting: Family Medicine

## 2021-09-07 NOTE — Telephone Encounter (Signed)
Last Visit: 04/10/21  Next Visit: 10/08/21  Last Refill:   Quanitiy:

## 2021-09-08 ENCOUNTER — Ambulatory Visit (INDEPENDENT_AMBULATORY_CARE_PROVIDER_SITE_OTHER): Payer: BC Managed Care – PPO | Admitting: Internal Medicine

## 2021-09-08 ENCOUNTER — Inpatient Hospital Stay (HOSPITAL_COMMUNITY): Admission: RE | Admit: 2021-09-08 | Payer: BC Managed Care – PPO | Source: Ambulatory Visit

## 2021-09-08 ENCOUNTER — Telehealth: Payer: Self-pay | Admitting: *Deleted

## 2021-09-08 ENCOUNTER — Other Ambulatory Visit: Payer: Self-pay | Admitting: Cardiovascular Disease

## 2021-09-08 ENCOUNTER — Ambulatory Visit: Payer: BC Managed Care – PPO

## 2021-09-08 ENCOUNTER — Encounter: Payer: Self-pay | Admitting: Internal Medicine

## 2021-09-08 ENCOUNTER — Other Ambulatory Visit: Payer: Self-pay

## 2021-09-08 VITALS — BP 128/74 | HR 58 | Temp 97.8°F | Ht 67.5 in | Wt 140.2 lb

## 2021-09-08 DIAGNOSIS — Z923 Personal history of irradiation: Secondary | ICD-10-CM

## 2021-09-08 DIAGNOSIS — N179 Acute kidney failure, unspecified: Secondary | ICD-10-CM | POA: Diagnosis not present

## 2021-09-08 DIAGNOSIS — I2510 Atherosclerotic heart disease of native coronary artery without angina pectoris: Secondary | ICD-10-CM

## 2021-09-08 DIAGNOSIS — Z8571 Personal history of Hodgkin lymphoma: Secondary | ICD-10-CM

## 2021-09-08 DIAGNOSIS — G4733 Obstructive sleep apnea (adult) (pediatric): Secondary | ICD-10-CM

## 2021-09-08 DIAGNOSIS — I5022 Chronic systolic (congestive) heart failure: Secondary | ICD-10-CM

## 2021-09-08 DIAGNOSIS — I10 Essential (primary) hypertension: Secondary | ICD-10-CM

## 2021-09-08 DIAGNOSIS — I5020 Unspecified systolic (congestive) heart failure: Secondary | ICD-10-CM

## 2021-09-08 DIAGNOSIS — I898 Other specified noninfective disorders of lymphatic vessels and lymph nodes: Secondary | ICD-10-CM

## 2021-09-08 DIAGNOSIS — Z8619 Personal history of other infectious and parasitic diseases: Secondary | ICD-10-CM

## 2021-09-08 DIAGNOSIS — Z9989 Dependence on other enabling machines and devices: Secondary | ICD-10-CM

## 2021-09-08 DIAGNOSIS — R590 Localized enlarged lymph nodes: Secondary | ICD-10-CM

## 2021-09-08 LAB — CBC WITH DIFFERENTIAL/PLATELET
Basophils Absolute: 0 10*3/uL (ref 0.0–0.1)
Basophils Relative: 0.5 % (ref 0.0–3.0)
Eosinophils Absolute: 0.1 10*3/uL (ref 0.0–0.7)
Eosinophils Relative: 2.6 % (ref 0.0–5.0)
HCT: 45.3 % (ref 39.0–52.0)
Hemoglobin: 15.1 g/dL (ref 13.0–17.0)
Lymphocytes Relative: 16 % (ref 12.0–46.0)
Lymphs Abs: 0.7 10*3/uL (ref 0.7–4.0)
MCHC: 33.3 g/dL (ref 30.0–36.0)
MCV: 91 fl (ref 78.0–100.0)
Monocytes Absolute: 0.4 10*3/uL (ref 0.1–1.0)
Monocytes Relative: 8.8 % (ref 3.0–12.0)
Neutro Abs: 3.3 10*3/uL (ref 1.4–7.7)
Neutrophils Relative %: 72.1 % (ref 43.0–77.0)
Platelets: 230 10*3/uL (ref 150.0–400.0)
RBC: 4.98 Mil/uL (ref 4.22–5.81)
RDW: 15.5 % (ref 11.5–15.5)
WBC: 4.6 10*3/uL (ref 4.0–10.5)

## 2021-09-08 LAB — BASIC METABOLIC PANEL
BUN: 42 mg/dL — ABNORMAL HIGH (ref 6–23)
CO2: 28 mEq/L (ref 19–32)
Calcium: 10 mg/dL (ref 8.4–10.5)
Chloride: 99 mEq/L (ref 96–112)
Creatinine, Ser: 1.39 mg/dL (ref 0.40–1.50)
GFR: 56.56 mL/min — ABNORMAL LOW (ref >60.00)
Glucose, Bld: 111 mg/dL — ABNORMAL HIGH (ref 70–99)
Potassium: 4.6 mEq/L (ref 3.5–5.1)
Sodium: 135 mEq/L (ref 135–145)

## 2021-09-08 LAB — HEPATIC FUNCTION PANEL
ALT: 25 U/L (ref 0–53)
AST: 26 U/L (ref 0–37)
Albumin: 4.4 g/dL (ref 3.5–5.2)
Alkaline Phosphatase: 160 U/L — ABNORMAL HIGH (ref 39–117)
Bilirubin, Direct: 0.3 mg/dL (ref 0.0–0.3)
Total Bilirubin: 0.9 mg/dL (ref 0.2–1.2)
Total Protein: 7.7 g/dL (ref 6.0–8.3)

## 2021-09-08 NOTE — Procedures (Signed)
° °  Sleep Study Report  Patient Information Study Date: 09/04/21 Patient Name: Shane [name] Patient ID: [id number] Birth Date: 31-Mar-2066 Age: 56 Gender: Male Referring Physician:Daniel Bensimhon, MD  TEST DESCRIPTION: Home sleep apnea testing was completed using the WatchPat, a Type 1 device, utilizing peripheral arterial tonometry (PAT), chest movement, actigraphy, pulse oximetry, pulse rate, body position and snore. AHI was calculated with apnea and hypopnea using valid sleep time as the denominator. RDI includes apneas, hypopneas, and RERAs. The data acquired and the scoring of sleep and all associated events were performed in accordance with the recommended standards and specifications as outlined in the AASM Manual for the Scoring of Sleep and Associated Events 2.2.0 (2015).  FINDINGS: 1. No evidence of Obstructive Sleep Apnea with AHI 3.3/hr. 2. No Central Sleep Apnea. 3. Oxygen desaturations as low as 78%. 4. Minimal snoring was present. O2 sats were < 88% for 0.1 minutes. 5. Total sleep time was 6 hrs and 0 min. 6. 15.6% of total sleep time was spent in REM sleep. 7. Normal sleep onset latency at 18 min. 8. Shortened REM sleep onset latency at 56 min. 9. Total awakenings were 3.  DIAGNOSIS: Normal study with no significant sleep disordered breathing.  RECOMMENDATIONS: 1. Normal study with no significant sleep disordered breathing.  2. Healthy sleep recommendations include: adequate nightly sleep (normal 7-9 hrs/night), avoidance of caffeine after noon and alcohol near bedtime, and maintaining a sleep environment that is cool, dark and quiet. 3. Weight loss for overweight patients is recommended.  4. Snoring recommendations include: weight loss where appropriate, side sleeping, and avoidance of alcohol before bed.  5. Operation of motor vehicle or dangerous equipment must be avoided when feeling drowsy, excessively sleepy, or mentally fatigued.  6. An ENT  consultation which may be useful for specific causes of and possible treatment of bothersome snoring .  7. Weight loss may be of benefit in reducing the severity of snoring.   Signature: Electronically Signed: 09/08/21 Fransico Him, MD; [facility]; [place], American Board of Sleep Medicine

## 2021-09-08 NOTE — Progress Notes (Signed)
OV 09/08/2021  Subjective:  Patient ID: Shane [name], male , DOB: 09-07-1965 , age 56 y.o. , MRN: [id number] , ADDRESS: [address] [place] [address] PCP Leamon Arnt, MD Patient Care Team: Leamon Arnt, MD as PCP - General (Family Medicine) Lorretta Harp, MD as Consulting Physician (Cardiology) Hayden Pedro, MD as Consulting Physician (Ophthalmology) Marin Olp Rudell Cobb, MD as Consulting Physician (Oncology)  This Provider for this visit: Treatment Team:  Attending Provider: Brand Males, MD    09/08/2021 -   Chief Complaint  Patient presents with   Consult    Pt is being referred by cardiologist due to spots seen on recent imaging. Pt denies any current complaints of cough, SOB, or chest discomfort.     HPI Shane [name] 56 y.o. -presents for follow-up with [name].  Shane [name] was a patient of mine for chronic cough and spinal lesions consistent with sarcoid from over a decade ago.  She was also my neighbor at the apartment complex over 10 years ago.  He has complicated set of medical issues.  He is 10 currently.  Approximately 25 years ago when he was 56 years old he had Hodgkin's disease and did get radiation to his chest and also got chemotherapy.  This was at [facility].  Details are not known.  Subsequently he is got ischemic cardiomyopathy with ejection fraction 25%.  Is also status post bypass.  He is also going through work-up for suspected primary biliary cirrhosis.  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  He has travel to the  but never lived there.  In 2015 he had a chest x-ray [this is the last chest x-ray both in the current records and the outside records] which I am unable to visualize but he does describe that he has calcified mediastinal granulomas.  He is unaware of this.  Most recently end of January 2023 had cardiac catheterization by Dr.  Quay Burow.  The report indicates that lesion suspected of Gohn complex were seen.  The following day he also had MRI of the heart and there is increased uptake of gadolinium to his left ventricle that is suggestive of ischemic heart disease or sarcoid.  He had a angiotensin-converting enzyme level around that time which is 81 and slightly high.  Therefore he has been referred here  Review of the labs indicate mid February 2023 his creatinine suddenly jumped up to 1.76 mg percent.  Prior to that it was all normal.  He endorses no respiratory complaints.  No shortness of breath no cough no wheezing no orthopnea no paroxysmal nocturnal dyspnea no hemoptysis.  Never came into contact with tuberculosis.  No autoimmune disease.    CT Chest data  No results found.    PFT  No flowsheet data found.     has a past medical history of CAD (coronary artery disease), s/p stent 2011 and CABG 2015 [facility]), Hodgkin disease [facility]) (1997), Hyperlipidemia, Macular degeneration, and PVC's (premature ventricular contractions).   reports that he has never smoked. He has quit using smokeless tobacco.  Past Surgical History:  Procedure Laterality Date   CARDIAC CATHETERIZATION  08/2009   PCI -LAD 2.59mmx18mm Xience   CORONARY ARTERY BYPASS GRAFT  02/2014   RIGHT/LEFT HEART CATH AND CORONARY ANGIOGRAPHY N/A 08/03/2021   Procedure: RIGHT/LEFT HEART CATH AND CORONARY ANGIOGRAPHY;  Surgeon: Quay Burow  J, MD;  Location: [facility];  Service: Cardiovascular;  Laterality: N/A;    No Known Allergies  Immunization History  Administered Date(s) Administered   Influenza,inj,Quad PF,6+ Mos 04/03/2018, 04/03/2019, 04/10/2021   Influenza-Unspecified 03/26/2020   PFIZER Comirnaty(Gray Top)Covid-19 Tri-Sucrose Vaccine 10/30/2020   PFIZER(Purple Top)SARS-COV-2 Vaccination 10/22/2019, 11/12/2019   Tdap 11/16/2017   Zoster Recombinat (Shingrix) 10/09/2020, 04/10/2021    Family History   Problem Relation Age of Onset   Stroke Mother    Cancer Mother    COPD Mother    Heart disease Father    COPD Father    Heart attack Father    Heart Problems Brother        heart valve replaced at 66 years old   Heart Problems Brother        MI at age 73; CABG at age 40   Cancer Maternal Grandmother    Colon cancer Neg Hx      Current Outpatient Medications:    acetaminophen (TYLENOL) 500 MG tablet, Take 500 mg by mouth every 6 (six) hours as needed., Disp: , Rfl:    amiodarone (PACERONE) 200 MG tablet, Take 1 tablet (200 mg total) by mouth 2 (two) times daily., Disp: 60 tablet, Rfl: 5   ARIPiprazole (ABILIFY) 5 MG tablet, Take 1 tablet (5 mg total) by mouth daily., Disp: 90 tablet, Rfl: 1   aspirin 81 MG tablet, Take 81 mg by mouth daily., Disp: , Rfl:    Bevacizumab (AVASTIN IV), Inject 1 Dose into the eye every 6 (six) weeks., Disp: , Rfl:    carvedilol (COREG) 3.125 MG tablet, Take 1 tablet by mouth twice daily., Disp: 180 tablet, Rfl: 3   dapagliflozin propanediol (FARXIGA) 10 MG TABS tablet, Take 1 tablet (10 mg total) by mouth daily before breakfast., Disp: 30 tablet, Rfl: 6   escitalopram (LEXAPRO) 20 MG tablet, Take 1 tablet (20 mg total) by mouth daily., Disp: 90 tablet, Rfl: 1   LORazepam (ATIVAN) 0.5 MG tablet, Take 1 tablet (0.5 mg total) by mouth 2 (two) times daily. (Patient taking differently: Take 0.5 mg by mouth 2 (two) times daily as needed (anxiety.).), Disp: 60 tablet, Rfl: 1   metFORMIN (GLUCOPHAGE-XR) 750 MG 24 hr tablet, Take 1 tablet by mouth daily with breakfast., Disp: 30 tablet, Rfl: 0   mexiletine (MEXITIL) 200 MG capsule, Take 1 capsule (200 mg total) by mouth 2 (two) times daily., Disp: 60 capsule, Rfl: 3   Multiple Vitamins-Minerals (PRESERVISION AREDS 2 PO), Take 2 tablets by mouth in the morning., Disp: , Rfl:    nitroGLYCERIN (NITROSTAT) 0.4 MG SL tablet, Place 1 tablet (0.4 mg total) under the tongue every 5 (five) minutes as needed for chest pain.,  Disp: 25 tablet, Rfl: 6   rosuvastatin (CRESTOR) 20 MG tablet, Take 1 tablet by mouth daily. **Please call to schedule office visit prior to further refills**, Disp: 90 tablet, Rfl: 3   sacubitril-valsartan (ENTRESTO) 24-26 MG, Take 1 tablet by mouth 2 (two) times daily., Disp: 60 tablet, Rfl: 3   spironolactone (ALDACTONE) 25 MG tablet, Take 0.5 tablets (12.5 mg total) by mouth daily., Disp: 15 tablet, Rfl: 5   torsemide (DEMADEX) 20 MG tablet, Take 2 tablets (40 mg total) by mouth daily., Disp: 60 tablet, Rfl: 5   ursodiol (ACTIGALL) 500 MG tablet, Take 500 mg by mouth 2 (two) times daily., Disp: , Rfl:       Objective:   Vitals:   09/08/21 0902  BP: 128/74  Pulse: (!) 58  Temp: 97.8 F (36.6 C)  TempSrc: Oral  SpO2: 99%  Weight: 140 lb 3.2 oz (63.6 kg)  Height: 5' 7.5" (1.715 m)    Estimated body mass index is 21.63 kg/m as calculated from the following:   Height as of this encounter: 5' 7.5" (1.715 m).   Weight as of this encounter: 140 lb 3.2 oz (63.6 kg).  @WEIGHTCHANGE @  [employer]   09/08/21 0902  Weight: 140 lb 3.2 oz (63.6 kg)     Physical Exam   General: No distress. Thn. Looks well Neuro: Alert and Oriented x 3. GCS 15. Speech normal Psych: Pleasant Resp:  Barrel Chest - no.  Wheeze - no, Crackles - no, No overt respiratory distress CVS: Normal heart sounds. Murmurs - no Ext: Stigmata of Connective Tissue Disease - no HEENT: Normal upper airway. PEERL +. No post nasal drip SKINL Dry        Assessment:       ICD-10-CM   1. Mediastinal adenopathy  R59.0 ANA    Anti-DNA antibody, double-stranded    QuantiFERON-TB Gold Plus    CBC with Differential/Platelet    Basic metabolic panel    Hepatic function panel    CT Chest High Resolution    Rheumatoid factor    Cyclic citrul peptide antibody, IgG    Cyclic citrul peptide antibody, IgG    Rheumatoid factor    Hepatic function panel    Basic metabolic panel    CBC with Differential/Platelet     QuantiFERON-TB Gold Plus    Anti-DNA antibody, double-stranded    ANA    2. History of therapeutic radiation  Z92.3 Rheumatoid factor    Cyclic citrul peptide antibody, IgG    Cyclic citrul peptide antibody, IgG    Rheumatoid factor    3. Calcified lymph nodes  I89.8 Rheumatoid factor    Cyclic citrul peptide antibody, IgG    Cyclic citrul peptide antibody, IgG    Rheumatoid factor    4. History of Hodgkin's lymphoma  Z85.71 Rheumatoid factor    Cyclic citrul peptide antibody, IgG    Cyclic citrul peptide antibody, IgG    Rheumatoid factor    5. History of histoplasmosis  Z86.19 Rheumatoid factor    Cyclic citrul peptide antibody, IgG    Cyclic citrul peptide antibody, IgG    Rheumatoid factor    6. AKI (acute kidney injury) ([place])  N17.9 Rheumatoid factor    Cyclic citrul peptide antibody, IgG    Cyclic citrul peptide antibody, IgG    Rheumatoid factor         Plan:     Patient Instructions     ICD-10-CM   1. Mediastinal adenopathy  R59.0     2. History of therapeutic radiation  Z92.3     3. Calcified lymph nodes  I89.8     4. History of Hodgkin's lymphoma  Z85.71     5. History of histoplasmosis  Z86.19     6. AKI (acute kidney injury) ([place])  N17.9       Most likely this is radiation related healing granuloma in the mediastinum.  Differential diagnosis includes histoplasmosis with calcified granuloma in the mediastinum versus tuberculosis versus sarcoid versus undiagnosed rheumatoid arthritis granulomatous  Plan Check ANA, ds-dna, Quantiferon Gold,  Check cbc, bmet , LFT Do HRCT supine and prone   - avoiding cotnrast given recent kidney injury Consider PET scan based on above results  Followup  Video visit wit APP next  few weeks to discuss test results    SIGNATURE    Dr. Brand Males, M.D., F.C.C.P,  Pulmonary and Critical Care Medicine Staff Physician, [place] Director - Interstitial Lung Disease  Program  Pulmonary  [place] at [place], [place], [address]  Pager: [phone], If no answer or between  15:00h - 7:00h: call [phone] Telephone: [phone]  9:34 AM 09/08/2021

## 2021-09-08 NOTE — Telephone Encounter (Signed)
-----   Message from Sueanne Margarita, MD sent at 09/08/2021  2:12 PM EST ----- Normal home sleep study so in lab PSG will be ordered

## 2021-09-08 NOTE — Telephone Encounter (Signed)
Patient notified of HST results and recommendations. He agrees to proceed with  in lab sleep study.

## 2021-09-08 NOTE — Patient Instructions (Addendum)
ICD-10-CM   1. Mediastinal adenopathy  R59.0     2. History of therapeutic radiation  Z92.3     3. Calcified lymph nodes  I89.8     4. History of Hodgkin's lymphoma  Z85.71     5. History of histoplasmosis  Z86.19     6. AKI (acute kidney injury) ([place])  N17.9       Most likely this is radiation related healing granuloma in the mediastinum.  Differential diagnosis includes histoplasmosis with calcified granuloma in the mediastinum versus tuberculosis versus sarcoid versus undiagnosed rheumatoid arthritis granulomatous  Plan Check ANA, ds-dna, Quantiferon Gold,  Check cbc, bmet , LFT Do HRCT supine and prone   - avoiding cotnrast given recent kidney injury Consider PET scan based on above results  Followup  Video visit wit APP next few weeks to discuss test results

## 2021-09-09 NOTE — Addendum Note (Signed)
Encounter addended by: Micki Riley, RN on: 09/09/2021 2:29 PM  Actions taken: Imaging Exam ended

## 2021-09-10 LAB — RHEUMATOID FACTOR: Rheumatoid fact SerPl-aCnc: <14 IU/mL (ref <14)

## 2021-09-10 LAB — CYCLIC CITRUL PEPTIDE ANTIBODY, IGG: Cyclic Citrullin Peptide Ab: <16 UNITS

## 2021-09-10 LAB — ANTI-DNA ANTIBODY, DOUBLE-STRANDED: ds DNA Ab: <1 IU/mL

## 2021-09-10 LAB — ANA: Anti Nuclear Antibody (ANA): NEGATIVE

## 2021-09-11 ENCOUNTER — Ambulatory Visit: Payer: BC Managed Care – PPO | Admitting: Cardiovascular Disease

## 2021-09-11 LAB — QUANTIFERON-TB GOLD PLUS
Mitogen-NIL: 6.37 IU/mL
NIL: 0.01 IU/mL
QuantiFERON-TB Gold Plus: NEGATIVE
TB1-NIL: 0 IU/mL
TB2-NIL: 0 IU/mL

## 2021-09-14 ENCOUNTER — Other Ambulatory Visit: Payer: Self-pay

## 2021-09-14 MED ORDER — CARVEDILOL 3.125 MG PO TABS: ORAL_TABLET | ORAL | 3 refills | Status: DC

## 2021-09-17 ENCOUNTER — Ambulatory Visit (HOSPITAL_COMMUNITY)
Admission: RE | Admit: 2021-09-17 | Discharge: 2021-09-17 | Disposition: A | Discharge status: Home / Self Care | Payer: BC Managed Care – PPO | Source: Ambulatory Visit | Attending: Cardiology | Admitting: Cardiology

## 2021-09-17 ENCOUNTER — Ambulatory Visit: Payer: BC Managed Care – PPO | Admitting: Cardiology

## 2021-09-17 ENCOUNTER — Other Ambulatory Visit: Payer: Self-pay

## 2021-09-17 ENCOUNTER — Encounter: Payer: Self-pay | Admitting: Cardiology

## 2021-09-17 ENCOUNTER — Ambulatory Visit (INDEPENDENT_AMBULATORY_CARE_PROVIDER_SITE_OTHER): Payer: BC Managed Care – PPO

## 2021-09-17 VITALS — BP 80/68 | HR 57 | Ht 67.5 in | Wt 139.2 lb

## 2021-09-17 DIAGNOSIS — I5020 Unspecified systolic (congestive) heart failure: Secondary | ICD-10-CM | POA: Insufficient documentation

## 2021-09-17 DIAGNOSIS — I5022 Chronic systolic (congestive) heart failure: Secondary | ICD-10-CM | POA: Diagnosis not present

## 2021-09-17 DIAGNOSIS — I493 Ventricular premature depolarization: Secondary | ICD-10-CM | POA: Diagnosis not present

## 2021-09-17 LAB — BASIC METABOLIC PANEL
Anion gap: 11 (ref 5–15)
BUN: 49 mg/dL — ABNORMAL HIGH (ref 6–20)
CO2: 31 mmol/L (ref 22–32)
Calcium: 9.6 mg/dL (ref 8.9–10.3)
Chloride: 94 mmol/L — ABNORMAL LOW (ref 98–111)
Creatinine, Ser: 1.89 mg/dL — ABNORMAL HIGH (ref 0.61–1.24)
GFR, Estimated: 41 mL/min — ABNORMAL LOW (ref >60)
Glucose, Bld: 88 mg/dL (ref 70–99)
Potassium: 4.1 mmol/L (ref 3.5–5.1)
Sodium: 136 mmol/L (ref 135–145)

## 2021-09-17 MED ORDER — MEXILETINE HCL 150 MG PO CAPS: 300.0000 mg | ORAL_CAPSULE | Freq: Two times a day (BID) | ORAL | 3 refills | Status: DC

## 2021-09-17 NOTE — Progress Notes (Unsigned)
Enrolled for [employer] to mail a ZIO XT long term holter monitor to the patients address on file.  

## 2021-09-17 NOTE — Patient Instructions (Signed)
Medication Instructions:  ?Your physician has recommended you make the following change in your medication:  ?STOP Amiodarone ?INCREASE Mexiletine to 300 mg twice daily ?STOP Aldactone ? ?*If you need a refill on your cardiac medications before your next appointment, please call your pharmacy* ? ? ?Lab Work: ?None ordered ? ? ? ?Testing/Procedures: ?                         ?\ ?ZIO XT- Long Term Monitor Instructions ? ?Your physician has requested you wear a ZIO patch monitor for 3 days in several months (place monitor about a month prior to your 3 month follow up)  ?Marland Kitchen  ?This is a single patch monitor. [employer] supplies one patch monitor per enrollment. Additional ?stickers are not available. Please do not apply patch if you will be having a Nuclear Stress Test,  ?Echocardiogram, Cardiac CT, MRI, or Chest Xray during the period you would be wearing the  ?monitor. The patch cannot be worn during these tests. You cannot remove and re-apply the  ?ZIO XT patch monitor.  ?Your ZIO patch monitor will be mailed 3 day USPS to your address on file. It may take 3-5 days  ?to receive your monitor after you have been enrolled.  ?Once you have received your monitor, please review the enclosed instructions. Your monitor  ?has already been registered assigning a specific monitor serial # to you. ? ?Billing and Patient Assistance Program Information ? ?We have supplied [employer] with any of your insurance information on file for billing purposes. ?[employer] offers a sliding scale Patient Assistance Program for patients that do not have  ?insurance, or whose insurance does not completely cover the cost of the ZIO monitor.  ?You must apply for the Patient Assistance Program to qualify for this discounted rate.  ?To apply, please call [employer] at [phone], select option 4, select option 2, ask to apply for  ?Patient Assistance Program. Theodore Demark will ask your household income, and how many people  ?are in your household. They will quote  your out-of-pocket cost based on that information.  ?[employer] will also be able to set up a 38-month interest-free payment plan if needed. ? ?Applying the monitor ?  ?Shave hair from upper left chest.  ?Hold abrader disc by orange tab. Rub abrader in 40 strokes over the upper left chest as  ?indicated in your monitor instructions.  ?Clean area with 4 enclosed alcohol pads. Let dry.  ?Apply patch as indicated in monitor instructions. Patch will be placed under collarbone on left  ?side of chest with arrow pointing upward.  ?Rub patch adhesive wings for 2 minutes. Remove white label marked "1". Remove the white  ?label marked "2". Rub patch adhesive wings for 2 additional minutes.  ?While looking in a mirror, press and release button in center of patch. A small green light will  ?flash 3-4 times. This will be your only indicator that the monitor has been turned on.  ?Do not shower for the first 24 hours. You may shower after the first 24 hours.  ?Press the button if you feel a symptom. You will hear a small click. Record Date, Time and  ?Symptom in the Patient Logbook.  ?When you are ready to remove the patch, follow instructions on the last 2 pages of Patient  ?Logbook. Stick patch monitor onto the last page of Patient Logbook.  ?Place Patient Logbook in the blue and white box. Use locking tab on box and tape  box closed  ?securely. The blue and white box has prepaid postage on it. Please place it in the mailbox as  ?soon as possible. Your physician should have your test results approximately 7 days after the  ?monitor has been mailed back to [facility].  ?Call [facility] at [phone] if you have questions regarding  ?your ZIO XT patch monitor. Call them immediately if you see an orange light blinking on your  ?monitor.  ?If your monitor falls off in less than 4 days, contact our Monitor department at [phone].  ?If your monitor becomes loose or falls off after 4 days call [employer] at  [phone] for  ?suggestions on securing your monitor ? ? ? ?Follow-Up: ?At [facility], you and your health needs are our priority.  As part of our continuing mission to provide you with exceptional heart care, we have created designated Provider Care Teams.  These Care Teams include your primary Cardiologist (physician) and Advanced Practice Providers (APPs -  Physician Assistants and Nurse Practitioners) who all work together to provide you with the care you need, when you need it. ? ?Your next appointment:   ?3 month(s) ? ?The format for your next appointment:   ?In Person ? ?Provider:   ?Allegra Lai, MD ? ? ? ?Thank you for choosing [facility]!! ? ? ?Trinidad Curet, RN ?[phone] ? ?  ?

## 2021-09-17 NOTE — Progress Notes (Signed)
Electrophysiology Office Note   Date:  09/17/2021   ID:  Shane YANNUZZI, DOB 1966-04-13, MRN [id number]  PCP:  Leamon Arnt, MD  Cardiologist:  Gwenlyn Found Primary Electrophysiologist:  Justen Fonda Meredith Leeds, MD    Chief Complaint: PVC   History of Present Illness: Shane [name] is a 56 y.o. male who is being seen today for the evaluation of PVC at the request of Shane [name]. Presenting today for electrophysiology evaluation.  He has a history STEMI for coronary artery disease, Hodgkin's disease, hyperlipidemia, macular degeneration, PVCs, systolic heart failure.  He had an anterior wall MI and had an LAD stent placed 08/28/2009.  Because of recurrent symptoms he underwent catheterization and ultimately bypass grafting 02/27/2014 with a LIMA to the LAD, vein to the ramus, OM, RCA.  His ejection fraction was 35% at that time.  Repeat echo showed an ejection fraction that was up to 50 to 55%.  He was recently diagnosed with biliary cirrhosis and is undergoing evaluation at [facility].    He had an echo that showed a new decline in his LV function to 30 to 35%.  Right and left heart cath January 2023 showed an occluded RCA, occluded vein graft to the right with left-to-right collaterals and otherwise patent grafts.  He was started on mexiletine for high burden of PVCs.  He was diuresed 18 pounds.  Today, he denies symptoms of palpitations, chest pain, shortness of breath, orthopnea, PND, lower extremity edema, claudication,  presyncope, syncope, bleeding, or neurologic sequela. The patient is tolerating medications without difficulties.  Feels well today.  He is much better since his hospitalization and diuresis.  He does note that he gets dizzy when standing.  His blood pressure is low today and he brings in blood pressure recordings that are consistently in the 58K to 99I systolic.   Past Medical History:  Diagnosis Date   CAD (coronary artery disease), s/p stent 2011 and  CABG 2015 [facility])    Hodgkin disease [facility]) 1997   Hyperlipidemia    Macular degeneration    PVC's (premature ventricular contractions)    Past Surgical History:  Procedure Laterality Date   CARDIAC CATHETERIZATION  08/2009   PCI -LAD 2.59mx18mm Xience   CORONARY ARTERY BYPASS GRAFT  02/2014   RIGHT/LEFT HEART CATH AND CORONARY ANGIOGRAPHY N/A 08/03/2021   Procedure: RIGHT/LEFT HEART CATH AND CORONARY ANGIOGRAPHY;  Surgeon: BLorretta Harp MD;  [facility];  Service: Cardiovascular;  Laterality: N/A;     Current Outpatient Medications  Medication Sig Dispense Refill   acetaminophen (TYLENOL) 500 MG tablet Take 500 mg by mouth every 6 (six) hours as needed (pain).     ARIPiprazole (ABILIFY) 5 MG tablet Take 1 tablet (5 mg total) by mouth daily. 90 tablet 1   aspirin 81 MG tablet Take 81 mg by mouth daily.     Bevacizumab (AVASTIN IV) Inject 1 Dose into the eye every 6 (six) weeks.     carvedilol (COREG) 3.125 MG tablet Take 1 tablet by mouth twice daily. 180 tablet 3   dapagliflozin propanediol (FARXIGA) 10 MG TABS tablet Take 1 tablet (10 mg total) by mouth daily before breakfast. 30 tablet 6   escitalopram (LEXAPRO) 20 MG tablet Take 1 tablet (20 mg total) by mouth daily. 90 tablet 1   LORazepam (ATIVAN) 0.5 MG tablet Take 1 tablet (0.5 mg total) by mouth 2 (two) times daily. (Patient taking differently: Take 0.5 mg by mouth  2 (two) times daily as needed (anxiety.).) 60 tablet 1   metFORMIN (GLUCOPHAGE-XR) 750 MG 24 hr tablet Take 1 tablet by mouth daily with breakfast. 30 tablet 0   mexiletine (MEXITIL) 150 MG capsule Take 2 capsules (300 mg total) by mouth 2 (two) times daily. 120 capsule 3   Multiple Vitamins-Minerals (PRESERVISION AREDS 2 PO) Take 2 tablets by mouth in the morning.     nitroGLYCERIN (NITROSTAT) 0.4 MG SL tablet Place 1 tablet (0.4 mg total) under the tongue every 5 (five) minutes as needed for chest pain. 25 tablet 6   rosuvastatin  (CRESTOR) 20 MG tablet Take 1 tablet by mouth daily. **Please call to schedule office visit prior to further refills** 90 tablet 3   sacubitril-valsartan (ENTRESTO) 24-26 MG Take 1 tablet by mouth 2 (two) times daily. 60 tablet 3   torsemide (DEMADEX) 20 MG tablet Take 2 tablets (40 mg total) by mouth daily. 60 tablet 5   ursodiol (ACTIGALL) 500 MG tablet Take 500 mg by mouth 2 (two) times daily.     No current facility-administered medications for this visit.    Allergies:   Patient has no known allergies.   Social History:  The patient  reports that he has never smoked. He has quit using smokeless tobacco. He reports current alcohol use of about 3.0 standard drinks per week. He reports that he does not use drugs.   Family History:  The patient's family history includes COPD in his father and mother; Cancer in his maternal grandmother and mother; Heart Problems in his brother and brother; Heart attack in his father; Heart disease in his father; Stroke in his mother.    ROS:  Please see the history of present illness.   Otherwise, review of systems is positive for none.   All other systems are reviewed and negative.    PHYSICAL EXAM: VS:  BP (!) 80/68    Pulse (!) 57    Ht 5' 7.5" (1.715 m)    Wt 139 lb 3.2 oz (63.1 kg)    SpO2 97%    BMI 21.48 kg/m  , BMI Body mass index is 21.48 kg/m. GEN: Well nourished, well developed, in no acute distress  HEENT: normal  Neck: no JVD, carotid bruits, or masses Cardiac: RRR; no murmurs, rubs, or gallops,no edema  Respiratory:  clear to auscultation bilaterally, normal work of breathing GI: soft, nontender, nondistended, + BS MS: no deformity or atrophy  Skin: warm and dry Neuro:  Strength and sensation are intact Psych: euthymic mood, full affect  EKG:  EKG is ordered today. Personal review of the ekg ordered shows sinus rhythm, PVCs  Recent Labs: 04/10/2021: TSH 8.54 08/14/2021: Magnesium 2.6 09/08/2021: ALT 25; BUN 42; Creatinine, Ser 1.39;  Hemoglobin 15.1; Platelets 230.0; Potassium 4.6; Sodium 135    Lipid Panel     Component Value Date/Time   CHOL 103 10/09/2020 0817   CHOL 109 10/19/2019 1146   TRIG 56.0 10/09/2020 0817   HDL 44.50 10/09/2020 0817   HDL 44 10/19/2019 1146   CHOLHDL 2 10/09/2020 0817   VLDL 11.2 10/09/2020 0817   LDLCALC 48 10/09/2020 0817   LDLCALC 50 10/19/2019 1146     Wt Readings from Last 3 Encounters:  09/17/21 139 lb 3.2 oz (63.1 kg)  09/08/21 140 lb 3.2 oz (63.6 kg)  08/18/21 140 lb 12.8 oz (63.9 kg)      Other studies Reviewed: Additional studies/ records that were reviewed today include: TTE 08/05/21  Review  of the above records today demonstrates:   1. Left ventricular ejection fraction, by estimation, is 25%. The left  ventricle demonstrates global hypokinesis.   2. Right ventricular systolic function is mildly reduced. The right  ventricular size is normal.   3. The aortic valve is tricuspid.   4. The inferior vena cava is normal in size with <50% respiratory  variability, suggesting right atrial pressure of 8 mmHg.   5. There is a large but loculated pericardial effusion located inferior  to the left ventricle. The LV does not appear compressed. The RV does not  appear significantly affected. No evidence for tamponade.   6. Limited echo with no doppler.   Cardiac monitor 09/09/2028 personally reviewed 8.8% ventricular ectopy Predominant underlying rhythm sinus rhythm  ASSESSMENT AND PLAN:  1.  PVCs: Initially 13% burden.  This is come down to 8% on his most recent cardiac monitor.  Currently on mexiletine 200 mg twice daily, amiodarone 200 mg twice daily.  High risk medication monitoring.  He would like to get off of his amiodarone.  Due to that we Valentin Benney increase his mexiletine to 300 mg twice daily.  I Devian Bartolomei see him back in 3 months with another monitor to determine if his PVC burden remains low.  At his current burden, would prefer to avoid ablation if possible.  2.   Coronary artery disease status post CABG: Has an occluded RCA.  Has left to right collaterals.  No current chest pain.  Plan per primary cardiology.  3.  Systolic heart failure: Ejection fraction severely reduced in January 2023.  Plan for echo upcoming.  Continue carvedilol 3.125 mg twice daily, Aldactone 12.5 mg daily, Farxiga 10 mg daily, Entresto 24/26 mg twice daily.  We Armanie Ullmer assess his ejection fraction on his next echo to determine if he would be a candidate for ICD therapy.  As his blood pressure is low, we Kylee Nardozzi stop his Aldactone today.  Case discussed with primary cardiology  Current medicines are reviewed at length with the patient today.   The patient does not have concerns regarding his medicines.  The following changes were made today: Increase mexiletine, stop amiodarone and Aldactone  Labs/ tests ordered today include:  Orders Placed This Encounter  Procedures   LONG TERM MONITOR (3-14 DAYS)   EKG 12-Lead     Disposition:   FU with Jonnatan Hanners 3 months  Signed, Shelonda Saxe Meredith Leeds, MD  09/17/2021 9:50 AM     [facility] [address] [place] [address] [phone] (office) [phone] (fax)

## 2021-09-18 ENCOUNTER — Telehealth (HOSPITAL_COMMUNITY): Payer: Self-pay | Admitting: Surgery

## 2021-09-18 DIAGNOSIS — I5022 Chronic systolic (congestive) heart failure: Secondary | ICD-10-CM

## 2021-09-18 MED ORDER — TORSEMIDE 20 MG PO TABS: 20.0000 mg | ORAL_TABLET | ORAL | 5 refills | Status: DC | PRN

## 2021-09-18 NOTE — Telephone Encounter (Signed)
I called patient and made him aware of results and recommendations per Allena Katz NP.  He will return next Friday for lab work.  I have updated medications in [facility]. ?

## 2021-09-18 NOTE — Telephone Encounter (Signed)
-----   Message from Rafael Bihari, [place] sent at 09/18/2021  8:02 AM EST ----- ?SCr elevated. Please change torsemide to 20 mg daily PRN weight gain/edema. Arlyce Harman was recently stopped as well. ? ?Repeat BMET in 1 week ?

## 2021-09-22 ENCOUNTER — Other Ambulatory Visit (HOSPITAL_COMMUNITY): Payer: Self-pay | Admitting: *Deleted

## 2021-09-22 ENCOUNTER — Encounter (HOSPITAL_COMMUNITY): Payer: Self-pay | Admitting: Internal Medicine

## 2021-09-22 MED ORDER — TORSEMIDE 20 MG PO TABS: 20.0000 mg | ORAL_TABLET | ORAL | 5 refills | Status: DC | PRN

## 2021-09-23 ENCOUNTER — Other Ambulatory Visit (HOSPITAL_COMMUNITY): Payer: Self-pay | Admitting: *Deleted

## 2021-09-23 MED ORDER — TORSEMIDE 20 MG PO TABS: 20.0000 mg | ORAL_TABLET | Freq: Every day | ORAL | 5 refills | Status: DC | PRN

## 2021-09-24 ENCOUNTER — Encounter: Payer: Self-pay | Admitting: Cardiovascular Disease

## 2021-09-24 ENCOUNTER — Encounter (HOSPITAL_COMMUNITY): Payer: Self-pay | Admitting: Internal Medicine

## 2021-09-25 ENCOUNTER — Ambulatory Visit (HOSPITAL_COMMUNITY): Admission: RE | Admit: 2021-09-25 | Payer: BC Managed Care – PPO | Source: Ambulatory Visit

## 2021-10-01 ENCOUNTER — Other Ambulatory Visit: Payer: Self-pay

## 2021-10-01 ENCOUNTER — Other Ambulatory Visit: Payer: Self-pay | Admitting: Adult Health

## 2021-10-01 ENCOUNTER — Ambulatory Visit (HOSPITAL_COMMUNITY)
Admission: RE | Admit: 2021-10-01 | Discharge: 2021-10-01 | Disposition: A | Discharge status: Home / Self Care | Payer: BC Managed Care – PPO | Source: Ambulatory Visit | Attending: Adult Health | Admitting: Adult Health

## 2021-10-01 DIAGNOSIS — F321 Major depressive disorder, single episode, moderate: Secondary | ICD-10-CM

## 2021-10-01 DIAGNOSIS — I5022 Chronic systolic (congestive) heart failure: Secondary | ICD-10-CM | POA: Diagnosis not present

## 2021-10-01 DIAGNOSIS — F39 Unspecified mood [affective] disorder: Secondary | ICD-10-CM

## 2021-10-01 DIAGNOSIS — F411 Generalized anxiety disorder: Secondary | ICD-10-CM

## 2021-10-01 LAB — BASIC METABOLIC PANEL
Anion gap: 8 (ref 5–15)
BUN: 41 mg/dL — ABNORMAL HIGH (ref 6–20)
CO2: 32 mmol/L (ref 22–32)
Calcium: 9.7 mg/dL (ref 8.9–10.3)
Chloride: 102 mmol/L (ref 98–111)
Creatinine, Ser: 1.82 mg/dL — ABNORMAL HIGH (ref 0.61–1.24)
GFR, Estimated: 43 mL/min — ABNORMAL LOW (ref >60)
Glucose, Bld: 96 mg/dL (ref 70–99)
Potassium: 4.6 mmol/L (ref 3.5–5.1)
Sodium: 142 mmol/L (ref 135–145)

## 2021-10-02 NOTE — Telephone Encounter (Signed)
RF requests show last filled 3/23. He should have been out in January if he was taking as prescribed. Patient also needs an appt. Called him and he said he was in a doctor's office and he would call to schedule an appt. He should have a 90-day supply of medication and will decline this RF.  ?

## 2021-10-06 ENCOUNTER — Other Ambulatory Visit: Payer: Self-pay

## 2021-10-06 ENCOUNTER — Encounter (INDEPENDENT_AMBULATORY_CARE_PROVIDER_SITE_OTHER): Payer: BC Managed Care – PPO | Admitting: Ophthalmology

## 2021-10-06 DIAGNOSIS — H348322 Tributary (branch) retinal vein occlusion, left eye, stable: Secondary | ICD-10-CM

## 2021-10-06 DIAGNOSIS — H32 Chorioretinal disorders in diseases classified elsewhere: Secondary | ICD-10-CM

## 2021-10-06 DIAGNOSIS — H35033 Hypertensive retinopathy, bilateral: Secondary | ICD-10-CM | POA: Diagnosis not present

## 2021-10-06 DIAGNOSIS — H43813 Vitreous degeneration, bilateral: Secondary | ICD-10-CM

## 2021-10-06 DIAGNOSIS — H353221 Exudative age-related macular degeneration, left eye, with active choroidal neovascularization: Secondary | ICD-10-CM | POA: Diagnosis not present

## 2021-10-06 DIAGNOSIS — B399 Histoplasmosis, unspecified: Secondary | ICD-10-CM

## 2021-10-06 DIAGNOSIS — I10 Essential (primary) hypertension: Secondary | ICD-10-CM

## 2021-10-06 DIAGNOSIS — H353111 Nonexudative age-related macular degeneration, right eye, early dry stage: Secondary | ICD-10-CM

## 2021-10-08 ENCOUNTER — Telehealth: Payer: Self-pay | Admitting: Family Medicine

## 2021-10-08 ENCOUNTER — Encounter: Payer: Self-pay | Admitting: Family Medicine

## 2021-10-08 ENCOUNTER — Ambulatory Visit (INDEPENDENT_AMBULATORY_CARE_PROVIDER_SITE_OTHER): Payer: BC Managed Care – PPO | Admitting: Family Medicine

## 2021-10-08 VITALS — BP 88/57 | HR 52 | Temp 97.6°F | Ht 67.5 in | Wt 143.4 lb

## 2021-10-08 DIAGNOSIS — K743 Primary biliary cirrhosis: Secondary | ICD-10-CM

## 2021-10-08 DIAGNOSIS — F411 Generalized anxiety disorder: Secondary | ICD-10-CM | POA: Diagnosis not present

## 2021-10-08 DIAGNOSIS — I5020 Unspecified systolic (congestive) heart failure: Secondary | ICD-10-CM | POA: Diagnosis not present

## 2021-10-08 DIAGNOSIS — I10 Essential (primary) hypertension: Secondary | ICD-10-CM | POA: Diagnosis not present

## 2021-10-08 DIAGNOSIS — E038 Other specified hypothyroidism: Secondary | ICD-10-CM

## 2021-10-08 DIAGNOSIS — Z0000 Encounter for general adult medical examination without abnormal findings: Secondary | ICD-10-CM

## 2021-10-08 DIAGNOSIS — Z23 Encounter for immunization: Secondary | ICD-10-CM

## 2021-10-08 DIAGNOSIS — E8881 Metabolic syndrome: Secondary | ICD-10-CM | POA: Diagnosis not present

## 2021-10-08 DIAGNOSIS — Z9989 Dependence on other enabling machines and devices: Secondary | ICD-10-CM

## 2021-10-08 DIAGNOSIS — F339 Major depressive disorder, recurrent, unspecified: Secondary | ICD-10-CM

## 2021-10-08 DIAGNOSIS — G4733 Obstructive sleep apnea (adult) (pediatric): Secondary | ICD-10-CM

## 2021-10-08 DIAGNOSIS — E782 Mixed hyperlipidemia: Secondary | ICD-10-CM | POA: Diagnosis not present

## 2021-10-08 DIAGNOSIS — Z951 Presence of aortocoronary bypass graft: Secondary | ICD-10-CM

## 2021-10-08 LAB — COMPREHENSIVE METABOLIC PANEL
ALT: 26 U/L (ref 0–53)
AST: 26 U/L (ref 0–37)
Albumin: 4.3 g/dL (ref 3.5–5.2)
Alkaline Phosphatase: 299 U/L — ABNORMAL HIGH (ref 39–117)
BUN: 40 mg/dL — ABNORMAL HIGH (ref 6–23)
CO2: 27 mEq/L (ref 19–32)
Calcium: 9.6 mg/dL (ref 8.4–10.5)
Chloride: 99 mEq/L (ref 96–112)
Creatinine, Ser: 1.43 mg/dL (ref 0.40–1.50)
GFR: 54.64 mL/min — ABNORMAL LOW (ref >60.00)
Glucose, Bld: 113 mg/dL — ABNORMAL HIGH (ref 70–99)
Potassium: 3.8 mEq/L (ref 3.5–5.1)
Sodium: 137 mEq/L (ref 135–145)
Total Bilirubin: 1 mg/dL (ref 0.2–1.2)
Total Protein: 6.9 g/dL (ref 6.0–8.3)

## 2021-10-08 LAB — CBC WITH DIFFERENTIAL/PLATELET
Basophils Absolute: 0.1 10*3/uL (ref 0.0–0.1)
Basophils Relative: 1 % (ref 0.0–3.0)
Eosinophils Absolute: 0.1 10*3/uL (ref 0.0–0.7)
Eosinophils Relative: 1.6 % (ref 0.0–5.0)
HCT: 39 % (ref 39.0–52.0)
Hemoglobin: 13 g/dL (ref 13.0–17.0)
Lymphocytes Relative: 13.6 % (ref 12.0–46.0)
Lymphs Abs: 1 10*3/uL (ref 0.7–4.0)
MCHC: 33.5 g/dL (ref 30.0–36.0)
MCV: 92.3 fl (ref 78.0–100.0)
Monocytes Absolute: 0.6 10*3/uL (ref 0.1–1.0)
Monocytes Relative: 7.6 % (ref 3.0–12.0)
Neutro Abs: 5.8 10*3/uL (ref 1.4–7.7)
Neutrophils Relative %: 76.2 % (ref 43.0–77.0)
Platelets: 249 10*3/uL (ref 150.0–400.0)
RBC: 4.22 Mil/uL (ref 4.22–5.81)
RDW: 16.4 % — ABNORMAL HIGH (ref 11.5–15.5)
WBC: 7.6 10*3/uL (ref 4.0–10.5)

## 2021-10-08 LAB — HEMOGLOBIN A1C: Hgb A1c MFr Bld: 7.2 % — ABNORMAL HIGH (ref 4.6–6.5)

## 2021-10-08 LAB — T4, FREE: Free T4: 0.5 ng/dL — ABNORMAL LOW (ref 0.60–1.60)

## 2021-10-08 LAB — LIPID PANEL
Cholesterol: 146 mg/dL (ref 0–200)
HDL: 67.7 mg/dL (ref >39.00)
LDL Cholesterol: 65 mg/dL (ref 0–99)
NonHDL: 78.28
Total CHOL/HDL Ratio: 2
Triglycerides: 67 mg/dL (ref 0.0–149.0)
VLDL: 13.4 mg/dL (ref 0.0–40.0)

## 2021-10-08 LAB — TSH: TSH: 62.93 u[IU]/mL — ABNORMAL HIGH (ref 0.35–5.50)

## 2021-10-08 MED ORDER — METFORMIN HCL ER 750 MG PO TB24: 750.0000 mg | ORAL_TABLET | Freq: Every day | ORAL | 3 refills | Status: DC

## 2021-10-08 NOTE — Telephone Encounter (Signed)
PT was not sure what the timeline should be for: ? ? ?Please schedule your nurse visits for your hepatitis vaccines as directed today.  ?You were given your 1st dose of both hepatitis b and a vaccines today ? ?Please advise and send back to front office. ?

## 2021-10-08 NOTE — Patient Instructions (Signed)
Please return in 12 months for your annual complete physical; please come fasting.  ?Please schedule your nurse visits for your hepatitis vaccines as directed today.  ?You were given your 1st dose of both hepatitis b and a vaccines today ? ?I will release your lab results to you on your MyChart account with further instructions. You may see the results before I do, but when I review them I will send you a message with my report or have my assistant call you if things need to be discussed. Please reply to my message with any questions. Thank you!  ? ?If you have any questions or concerns, please don't hesitate to send me a message via MyChart or call the office at [phone]. Thank you for visiting with [place] today! It's our pleasure caring for you.  ?

## 2021-10-08 NOTE — Progress Notes (Signed)
?Subjective  ?Chief Complaint  ?Patient presents with  ? Annual Exam  ?  Pt is fasting. No concerns.   ? Hypertension  ? Hyperlipidemia  ? Coronary Artery Disease  ?  Pt admitted 08/03/21; Right/Left heart cath- D/C 08/06/21.  ? ? ?HPI: Shane [name] is a 56 y.o. male who presents to [facility] today for a Male Wellness Visit. He also has the concerns and/or needs as listed above in the chief complaint. These will be addressed in addition to the Health Maintenance Visit.  ? ?Wellness Visit: annual visit with health maintenance review and exam  ? ?Health maintenance: Colorectal cancer screening is current.  Immunizations: Eligible for hepatitis A and hepatitis B vaccines due to liver disease. ? ?Body mass index is 22.13 kg/m?. ?Wt Readings from Last 3 Encounters:  ?10/08/21 143 lb 6.4 oz (65 kg)  ?09/17/21 139 lb 3.2 oz (63.1 kg)  ?09/08/21 140 lb 3.2 oz (63.6 kg)  ? ? ? ?Chronic disease management visit and/or acute problem visit: ?Liver disease: Reviewed GI notes.  Possible PBC but biopsy was not diagnostic.  On medications.  Monitoring every 6 months. ?Reduced ejection fraction heart failure: Reviewed cardiology notes.  Working on fluid management.  Reviewed multiple heart medications. ?Chronic depression managed by psychiatry.  On Abilify and Lexapro.  Reports mood is stable. ?Subclinical hypothyroidism: Reports good energy levels.  Due for recheck today. ?Insulin resistance on metformin.  Also on Farxiga for heart failure.  Diet is healthy ?Hyperlipidemia on statin: Fasting for recheck.  He has been at goal. ?History of Hodgkin's lymphoma with abnormal mediastinal adenopathy: Reviewed pulmonology notes.  Chest CT pending.  Likely scarring ? ?Patient Active Problem List  ? Diagnosis Date Noted  ? CAD (coronary artery disease) 12/03/2019  ? Essential hypertension 07/24/2019  ? OSA on CPAP 09/07/2018  ? GAD (generalized anxiety disorder) 05/25/2018  ? Major depression, recurrent,  chronic ([place]) 05/25/2018  ? S/P CABG (coronary artery bypass graft) 11/16/2017  ? Mixed hyperlipidemia 05/24/2014  ? Alcohol use disorder, mild, abuse 03/25/2019  ? Insulin resistance 11/16/2017  ? Subclinical hypothyroidism 11/16/2017  ? Macular degeneration   ? History of Hodgkin's lymphoma 07/13/1995  ? PVC's (premature ventricular contractions) 09/11/2014  ? Primary biliary cholangitis ([place]) 10/08/2021  ? HFrEF (heart failure with reduced ejection fraction) ([place]) 08/18/2021  ? LV dysfunction 07/24/2021  ? ?Health Maintenance  ?Topic Date Due  ? COVID-19 Vaccine (4 - Booster for Pfizer series) 10/24/2021 (Originally 12/25/2020)  ? COLONOSCOPY (Pts 45-13yr Insurance coverage will need to be confirmed)  11/04/2025  ? TETANUS/TDAP  11/17/2027  ? INFLUENZA VACCINE  Completed  ? Hepatitis C Screening  Completed  ? HIV Screening  Completed  ? Zoster Vaccines- Shingrix  Completed  ? HPV VACCINES  Aged Out  ? ?Immunization History  ?Administered Date(s) Administered  ? Influenza,inj,Quad PF,6+ Mos 04/03/2018, 04/03/2019, 04/10/2021  ? Influenza-Unspecified 03/26/2020  ? PFIZER Comirnaty(Gray Top)Covid-19 Tri-Sucrose Vaccine 10/30/2020  ? PFIZER(Purple Top)SARS-COV-2 Vaccination 10/22/2019, 11/12/2019  ? Tdap 11/16/2017  ? Zoster Recombinat (Shingrix) 10/09/2020, 04/10/2021  ? ?We updated and reviewed the patient's past history in detail and it is documented below. ?Allergies: ?Patient has No Known Allergies. ?Past Medical History ? has a past medical history of CAD (coronary artery disease), s/p stent 2011 and CABG 2015 ([facility], Hodgkin disease ([facility] (1997), Hyperlipidemia, Macular degeneration, and PVC's (premature ventricular contractions). ?Past Surgical History ?Patient  has a past surgical history that includes Coronary artery bypass  graft (02/2014); Cardiac catheterization (08/2009); and RIGHT/LEFT HEART CATH AND CORONARY ANGIOGRAPHY (N/A, 08/03/2021). ?Social History ?Patient  reports that he has never  smoked. He has quit using smokeless tobacco. He reports current alcohol use of about 3.0 standard drinks per week. He reports that he does not use drugs. ?Family History ?family history includes COPD in his father and mother; Cancer in his maternal grandmother and mother; Heart Problems in his brother and brother; Heart attack in his father; Heart disease in his father; Stroke in his mother. ?Review of Systems: ?Constitutional: negative for fever or malaise ?Ophthalmic: negative for photophobia, double vision or loss of vision ?Cardiovascular: negative for chest pain, dyspnea on exertion, or new LE swelling ?Respiratory: negative for SOB or persistent cough ?Gastrointestinal: negative for abdominal pain, change in bowel habits or melena ?Genitourinary: negative for dysuria or gross hematuria ?Musculoskeletal: negative for new gait disturbance or muscular weakness ?Integumentary: negative for new or persistent rashes ?Neurological: negative for TIA or stroke symptoms ?Psychiatric: negative for SI or delusions ?Allergic/Immunologic: negative for hives ? ?Patient Care Team  ?  Relationship Specialty Notifications Start End  ?Leamon Arnt, MD PCP - General Family Medicine All results, Admissions 12/03/19   ?Lorretta Harp, MD Consulting Physician Cardiology  11/16/17   ?Hayden Pedro, MD Consulting Physician Ophthalmology  11/16/17   ?Volanda Napoleon, MD Consulting Physician Oncology  11/16/17   ? ?Objective  ?Vitals: BP (!) 88/57   Pulse (!) 52   Temp 97.6 ?F (36.4 ?C) (Temporal)   Ht 5' 7.5" (1.715 m)   Wt 143 lb 6.4 oz (65 kg)   SpO2 100%   BMI 22.13 kg/m?  ?General:  Well developed, well nourished, no acute distress  ?Psych:  Alert and orientedx3,normal mood and affect ?HEENT:  Normocephalic, atraumatic, non-icteric sclera, PERRL, oropharynx is clear without mass or exudate, supple neck without adenopathy, mass or thyromegaly ?Cardiovascular:  Normal S1, S2, bradycardic without gallop, rub or murmur,  nondisplaced PMI, +2 distal pulses in bilateral upper and lower extremities. ?Respiratory:  Good breath sounds bilaterally, CTAB with normal respiratory effort ?Gastrointestinal: normal bowel sounds, soft, non-tender, no noted masses. No HSM ?MSK: no deformities, contusions. Joints are without erythema or swelling. Spine and CVA region are nontender ?Skin:  Warm, no rashes or suspicious lesions noted ?Neurologic:    Mental status is normal. CN 2-11 are normal. Gross motor and sensory exams are normal. Stable gait. No tremor ?GU: No inguinal hernias or adenopathy are appreciated bilaterally ?  ?Assessment  ?1. Annual physical exam   ?2. Essential hypertension   ?3. GAD (generalized anxiety disorder)   ?4. Mixed hyperlipidemia   ?5. Insulin resistance   ?6. HFrEF (heart failure with reduced ejection fraction) ([place])   ?7. Primary biliary cholangitis ([place])   ?8. Major depression, recurrent, chronic (HCC) Chronic  ?9. Subclinical hypothyroidism   ?10. S/P CABG (coronary artery bypass graft)   ?11. OSA on CPAP   ? ?  ?Plan  ?Male Wellness Visit: ?Age appropriate Health Maintenance and Prevention measures were discussed with patient. Included topics are cancer screening recommendations, ways to keep healthy (see AVS) including dietary and exercise recommendations, regular eye and dental care, use of seat belts, and avoidance of moderate alcohol use and tobacco use.  Screens are current ?BMI: discussed patient's BMI and encouraged positive lifestyle modifications to help get to or maintain a target BMI. ?HM needs and immunizations were addressed and ordered. See below for orders. See HM and immunization section for updates.  First hepatitis A and B vaccines given today.  Will return in 6 months for second doses ?Routine labs and screening tests ordered including cmp, cbc and lipids where appropriate. ?Discussed recommendations regarding Vit D and calcium supplementation (see AVS) ? ?Chronic disease f/u and/or acute problem  visit: (deemed necessary to be done in addition to the wellness visit): ?Liver disease: Follow-up with GI.  LFTs have improved. ?Heart failure per heart failure clinic. ?CAD: Stable status post CABG ?Pulmonology: Jenetta Downer

## 2021-10-08 NOTE — Telephone Encounter (Signed)
He is to come back for: ? ?(2nd and final) Hep A-in 6 mths. ?(2nd and final) Hep B-in 1 mth. ? ?Thank you!  ?

## 2021-10-09 LAB — T3: T3, Total: 59 ng/dL — ABNORMAL LOW (ref 76–181)

## 2021-10-14 ENCOUNTER — Encounter: Payer: Self-pay | Admitting: Family Medicine

## 2021-10-14 ENCOUNTER — Ambulatory Visit
Admission: RE | Admit: 2021-10-14 | Discharge: 2021-10-14 | Disposition: A | Discharge status: Home / Self Care | Payer: BC Managed Care – PPO | Source: Ambulatory Visit | Attending: Internal Medicine | Admitting: Internal Medicine

## 2021-10-14 ENCOUNTER — Ambulatory Visit (HOSPITAL_BASED_OUTPATIENT_CLINIC_OR_DEPARTMENT_OTHER): Payer: BC Managed Care – PPO | Admitting: Cardiology

## 2021-10-14 DIAGNOSIS — Z8709 Personal history of other diseases of the respiratory system: Secondary | ICD-10-CM | POA: Diagnosis not present

## 2021-10-14 DIAGNOSIS — R590 Localized enlarged lymph nodes: Secondary | ICD-10-CM

## 2021-10-14 DIAGNOSIS — I700 Atherosclerosis of aorta: Secondary | ICD-10-CM | POA: Diagnosis not present

## 2021-10-14 DIAGNOSIS — Z8571 Personal history of Hodgkin lymphoma: Secondary | ICD-10-CM | POA: Diagnosis not present

## 2021-10-14 DIAGNOSIS — I2510 Atherosclerotic heart disease of native coronary artery without angina pectoris: Secondary | ICD-10-CM | POA: Diagnosis not present

## 2021-10-14 MED ORDER — METFORMIN HCL ER 500 MG PO TB24: 1000.0000 mg | ORAL_TABLET | Freq: Every day | ORAL | 3 refills | Status: DC

## 2021-10-14 MED ORDER — LEVOTHYROXINE SODIUM 50 MCG PO TABS: 50.0000 ug | ORAL_TABLET | Freq: Every day | ORAL | 0 refills | Status: DC

## 2021-10-14 NOTE — Addendum Note (Signed)
Addended by: Billey Chang on: 10/14/2021 11:44 AM ? ? Modules accepted: Orders ? ?

## 2021-10-15 ENCOUNTER — Institutional Professional Consult (permissible substitution): Payer: BC Managed Care – PPO | Admitting: Internal Medicine

## 2021-10-15 ENCOUNTER — Encounter: Payer: Self-pay | Admitting: Family Medicine

## 2021-10-15 ENCOUNTER — Other Ambulatory Visit: Payer: Self-pay | Admitting: *Deleted

## 2021-10-15 MED ORDER — METFORMIN HCL ER 500 MG PO TB24
1000.0000 mg | ORAL_TABLET | Freq: Every day | ORAL | 3 refills | Status: DC
Start: 2021-10-15 — End: 2022-02-08

## 2021-10-20 ENCOUNTER — Other Ambulatory Visit (HOSPITAL_COMMUNITY): Payer: Self-pay | Admitting: *Deleted

## 2021-10-20 ENCOUNTER — Encounter: Payer: Self-pay | Admitting: Family Medicine

## 2021-10-20 ENCOUNTER — Encounter (HOSPITAL_COMMUNITY): Payer: Self-pay | Admitting: Internal Medicine

## 2021-10-20 ENCOUNTER — Encounter: Payer: Self-pay | Admitting: Nurse Practitioner

## 2021-10-20 ENCOUNTER — Telehealth (INDEPENDENT_AMBULATORY_CARE_PROVIDER_SITE_OTHER): Payer: BC Managed Care – PPO | Admitting: Nurse Practitioner

## 2021-10-20 DIAGNOSIS — Z8571 Personal history of Hodgkin lymphoma: Secondary | ICD-10-CM

## 2021-10-20 DIAGNOSIS — R251 Tremor, unspecified: Secondary | ICD-10-CM | POA: Diagnosis not present

## 2021-10-20 DIAGNOSIS — F3110 Bipolar disorder, current episode manic without psychotic features, unspecified: Secondary | ICD-10-CM | POA: Diagnosis not present

## 2021-10-20 DIAGNOSIS — F411 Generalized anxiety disorder: Secondary | ICD-10-CM | POA: Diagnosis not present

## 2021-10-20 DIAGNOSIS — I5020 Unspecified systolic (congestive) heart failure: Secondary | ICD-10-CM

## 2021-10-20 DIAGNOSIS — F902 Attention-deficit hyperactivity disorder, combined type: Secondary | ICD-10-CM | POA: Diagnosis not present

## 2021-10-20 DIAGNOSIS — R590 Localized enlarged lymph nodes: Secondary | ICD-10-CM

## 2021-10-20 MED ORDER — TORSEMIDE 20 MG PO TABS: 20.0000 mg | ORAL_TABLET | Freq: Every day | ORAL | 5 refills | Status: DC | PRN

## 2021-10-20 NOTE — Assessment & Plan Note (Signed)
HRCT findings with stable, unchanged mediastial calcified nodes when compared to 2015, consistent with treated Hodgkin's disease. Given this as well as negative serologies, suspect mot likely radiation related granulomas. Would not recommend PET at this time. Advised to notify if he develops any new respiratory symptoms or has further questions or concerns.  ?

## 2021-10-20 NOTE — Assessment & Plan Note (Signed)
Seems compensated per pt's reports. Denied any leg swelling. Recently cut torsemide in half, as directed by Dr. Haroldine Laws. Advised he contact their office with questions regarding dosing moving forward. Verbalized understanding.  ?

## 2021-10-20 NOTE — Assessment & Plan Note (Signed)
Treated in 30's at [facility]. Current findings consistent with radiation therapy to chest. ?

## 2021-10-20 NOTE — Progress Notes (Signed)
? ?Patient ID: Shane [name], male     DOB: 05-Oct-1965, 56 y.o.      MRN: [id number] ? ?Chief Complaint  ?Patient presents with  ? Follow-up  ?  Follow up appointment. Pt states that he is feeeling great this morning. No issues noted pulmonary wise. Pt states he had a CT but is unaware of the results. Has questions about the torsemide!   ? ? ?Virtual Visit via Video Note ? ?I connected with Marla Roe on 10/20/21 at  9:00 AM EDT by a video enabled telemedicine application and verified that I am speaking with the correct person using two identifiers. ? ?Location: ?Patient: Home ?Provider: Office ?  ?I discussed the limitations of evaluation and management by telemedicine and the availability of in person appointments. The patient expressed understanding and agreed to proceed. ? ?History of Present Illness: ?56 year old male, never smoker followed for mediastinal adenopathy. He is a patient of Dr. Golden Pop and last seen for initial consult 09/08/2021. He was referred by his cardiologist after undergoing cardiac MRI which showed late gadolinium enhancement, concerning for multi-vessel coronary disease vs cardiac sarcoidosis. He did have a slightly elevated ACE level to 81. On previous imaging, there are reports of calcified granulomas as well; however, he has a history of Hodgkin's disease and received radiation to his chest as well as underwent chemotherapy. Past medical history significant for HFrEF, CAD s/p CABG, HTN, primary biliary cholangitis, hypothyroidism, HLD, macular degeneration, anxiety, depression, hx of alcohol use disorder.  ? ?09/08/2021: OV with Dr. Chase Caller. Initial consult given abnormal cardiac MRI and hx of calcified mediastinal granulomas on imaging. Suspected LAD most likely radiation related. Workup to r/o histoplasmosis vs sarcoid vs TB vs RA. Serologies/labs unremarkable. HRCT ordered.  ? ?10/20/2021: Today - follow up ?Patient presents today via virtual visit with wife to  discuss HRCT results and lab results. His HRCT showed multiple large, coarsely calcified mediastinal nodes which were unchanged when compared to previous exam in 2015. They were in keeping with treated Hodgkin's disease. There were no enlarged noncalcified mediastinal or hilar lymph nodes. Lungs were otherwise clear. Previously identified pleural effusion and pericardial effusion had resolved. These findings were discussed with the pt and his wife. Today, he reports feeling well. He denies any issues with his breathing. He has not had any recent issues with lower extremity swelling or orthopnea. No reports of skin lesions, vision changes, fevers, night sweats, fatigue, or weight loss. He was curious about what to do with his torsemide dosing. Otherwise, has no concerns or complaints.  ? ?No Known Allergies ?Immunization History  ?Administered Date(s) Administered  ? Hepatitis A, Adult 10/08/2021  ? Hepb-cpg 10/08/2021  ? Influenza,inj,Quad PF,6+ Mos 04/03/2018, 04/03/2019, 04/10/2021  ? Influenza-Unspecified 03/26/2020  ? PFIZER Comirnaty(Gray Top)Covid-19 Tri-Sucrose Vaccine 10/30/2020  ? PFIZER(Purple Top)SARS-COV-2 Vaccination 10/22/2019, 11/12/2019  ? Tdap 11/16/2017  ? Zoster Recombinat (Shingrix) 10/09/2020, 04/10/2021  ? ?Past Medical History:  ?Diagnosis Date  ? CAD (coronary artery disease), s/p stent 2011 and CABG 2015 [facility])   ? Hodgkin disease ([place]) 1997  ? Hyperlipidemia   ? Macular degeneration   ? PVC's (premature ventricular contractions)   ? ? ?Tobacco History: ?Social History  ? ?Tobacco Use  ?Smoking Status Never  ?Smokeless Tobacco Former  ?Tobacco Comments  ? a long time ago  ? ?Counseling given: Not Answered ?Tobacco comments: a long time ago ? ? ?Outpatient Medications Prior to Visit  ?Medication Sig Dispense Refill  ? acetaminophen (  TYLENOL) 500 MG tablet Take 500 mg by mouth every 6 (six) hours as needed (pain).    ? ARIPiprazole (ABILIFY) 5 MG tablet Take 1 tablet (5 mg  total) by mouth daily. 90 tablet 1  ? aspirin 81 MG tablet Take 81 mg by mouth daily.    ? Bevacizumab (AVASTIN IV) Inject 1 Dose into the eye every 6 (six) weeks.    ? carvedilol (COREG) 3.125 MG tablet Take 1 tablet by mouth twice daily. 180 tablet 3  ? ciprofloxacin (CILOXAN) 0.3 % ophthalmic solution SMARTSIG:In Eye(s)    ? dapagliflozin propanediol (FARXIGA) 10 MG TABS tablet Take 1 tablet (10 mg total) by mouth daily before breakfast. 30 tablet 6  ? escitalopram (LEXAPRO) 20 MG tablet Take 1 tablet (20 mg total) by mouth daily. 90 tablet 1  ? levothyroxine (SYNTHROID) 50 MCG tablet Take 1 tablet (50 mcg total) by mouth daily. 90 tablet 0  ? LORazepam (ATIVAN) 0.5 MG tablet Take 1 tablet (0.5 mg total) by mouth 2 (two) times daily. (Patient taking differently: Take 0.5 mg by mouth 2 (two) times daily as needed (anxiety.).) 60 tablet 1  ? metFORMIN (GLUCOPHAGE-XR) 500 MG 24 hr tablet Take 2 tablets (1,000 mg total) by mouth daily with breakfast. 180 tablet 3  ? mexiletine (MEXITIL) 150 MG capsule Take 2 capsules (300 mg total) by mouth 2 (two) times daily. 120 capsule 3  ? Multiple Vitamins-Minerals (PRESERVISION AREDS 2 PO) Take 2 tablets by mouth in the morning.    ? nitroGLYCERIN (NITROSTAT) 0.4 MG SL tablet Place 1 tablet (0.4 mg total) under the tongue every 5 (five) minutes as needed for chest pain. 25 tablet 6  ? rosuvastatin (CRESTOR) 20 MG tablet Take 1 tablet by mouth daily. **Please call to schedule office visit prior to further refills** 90 tablet 3  ? sacubitril-valsartan (ENTRESTO) 24-26 MG Take 1 tablet by mouth 2 (two) times daily. 60 tablet 3  ? torsemide (DEMADEX) 20 MG tablet Take 1 tablet (20 mg total) by mouth daily as needed. For weight gain and/or swelling 30 tablet 5  ? ursodiol (ACTIGALL) 500 MG tablet Take 500 mg by mouth 2 (two) times daily.    ? ?No facility-administered medications prior to visit.  ? ?  ?Review of Systems:  ? ?Constitutional: No weight loss or gain, night sweats,  fevers, chills, fatigue, or lassitude. ?HEENT: No headaches, difficulty swallowing, tooth/dental problems, or sore throat. No sneezing, itching, ear ache, nasal congestion, or post nasal drip ?CV:  No chest pain, orthopnea, PND, swelling in lower extremities, anasarca, dizziness, palpitations, syncope ?Resp: No shortness of breath with exertion or at rest. No excess mucus or change in color of mucus. No productive or non-productive. No hemoptysis. No wheezing.  No chest wall deformity ?GI:  No heartburn, indigestion, abdominal pain, nausea, vomiting, diarrhea, change in bowel habits, loss of appetite, bloody stools.  ?Skin: No rash, lesions, ulcerations ?MSK:  No joint pain or swelling.  No decreased range of motion.  No back pain. ?Neuro: No dizziness or lightheadedness.  ?Psych: No depression or anxiety. Mood stable.  ? ?Observations/Objective: ?Patient is well-developed, well-nourished in no acute distress. A&Ox3. Resting comfortably at home. Unlabored breathing. Speech is clear and coherent with logical content.  ? ? ? ?Assessment and Plan: ?Mediastinal adenopathy ?HRCT findings with stable, unchanged mediastial calcified nodes when compared to 2015, consistent with treated Hodgkin's disease. Given this as well as negative serologies, suspect mot likely radiation related granulomas. Would not recommend PET at  this time. Advised to notify if he develops any new respiratory symptoms or has further questions or concerns.  ? ?HFrEF (heart failure with reduced ejection fraction) ([place]) ?Seems compensated per pt's reports. Denied any leg swelling. Recently cut torsemide in half, as directed by Dr. Haroldine Laws. Advised he contact their office with questions regarding dosing moving forward. Verbalized understanding.  ? ?History of Hodgkin's lymphoma ?Treated in 30's at [facility]. Current findings consistent with radiation therapy to chest. ? ? ? ?Follow Up Instructions: Follow up with Dr. Chase Caller as needed. If symptoms do not  improve or worsen, please contact office for sooner follow up or seek emergency care. ? ?  ?I discussed the assessment and treatment plan with the patient. The patient was provided an opportunity to ask questions an

## 2021-10-22 ENCOUNTER — Ambulatory Visit: Payer: BC Managed Care – PPO | Admitting: Psychiatry

## 2021-10-22 NOTE — Progress Notes (Unsigned)
?    [facility] Counselor/Therapist Progress Note ? ?Patient ID: Shane [name], MRN: [id number],   ? ?Date: 10/22/2021 ? ?Time Spent: ***  ? ?Treatment Type: {CHL AMB THERAPY TYPES:[phone]} ? ?Reported Symptoms: ***        ? ? ?Patient today reports anxiety, frustration, and sadness," mostly around marital issues that affect personal issues."   ? ?Mental Status Exam: ? ?Appearance:   {PSY:22683}     ?Behavior:  {PSY:21022743}  ?Motor:  {PSY:22302}  ?Speech/Language:   {PSY:22685}  ?Affect:  {PSY:22687}  ?Mood:  {PSY:31886}  ?Thought process:  {PSY:31888}  ?Thought content:    {[phone]}  ?Sensory/Perceptual disturbances:    {[phone]}  ?Orientation:  {PSY:30297}  ?Attention:  {PSY:22877}  ?Concentration:  {[phone]}  ?Memory:  {PSY:[phone]}  ?Fund of knowledge:   {[phone]}  ?Insight:    {[phone]}  ?Judgment:   {[phone]}  ?Impulse Control:  {[phone]}  ? ?Risk Assessment: ?Danger to Self:  {PSY:22692} ?Self-injurious Behavior: {PSY:22692} ?Danger to Others: {PSY:22692} ?Duty to Warn:{PSY:311194} ?Physical Aggression / Violence:{PSY:21197} ?Access to Firearms a concern: {PSY:21197} ? ?Gang Involvement:{PSY:21197} ? ?Subjective: ***  ? ? ? ?Patient in today reporting anxiety, some depression, and marital issues. Processes some very recent interactions/conversations with his wife, which has led him to more seriously question his marriage.  ?Problems increased most recently with wife's being paranoid and patient is finding it difficult "to know what to do next." Is feeling not very hopeful about his marriage as he is" very tired of hoping for the best and wife seeming to sabotage things and make accusations that are just not true."  Needed session today to discuss what all this means to him and he is not wanting to make a quick decision but is getting increasingly exhausted in "trying to help make things work out better for their marriage".  Stayed in a hotel  recently when reportedly wife was being very difficult and making paranoid accusations of him again.  Patient processed a lot of his feelings and frustrations today and is choosing not to make any quick decisions until he has had more time to think about it and try to discuss some his most current marital concern in his marital therapy session coming up.  Reviewed some of our prior work on managing his anxious/negative thoughts, conflict, and best communication strategies which can help him at home and at work.  Also reviewed active listening skills that can very much be a part of his better managing conflict.  ? ? ?Interventions: {PSY:[phone]} ? ?Treatment Goals: ?Goals remain on tx plan while patient works on strategies to meet his goals. Progress is documented each session in the "Progress" section on Plan. ?Long Term Goal: ?Enhance the ability to handle effectively his depression and the full variety of life's anxieties. ? Short Term Goal:  ?Verbalize an understanding of how thoughts, physical feelings, and behavioral actions contribute to anxiety and anger and its treatment. ?Strategy:  ?Patient will work to recognize and interrupt triggers to his anxious or depressive thoughts  so as to replace with positive, more reality-based thoughts, which lead to more positive actions/behaviors. ? ? ?Diagnosis:No diagnosis found. ? ?Plan: ***Patient in today showing good motivation and active participation in session.  Focused on ? ? ? ? ? ? ?//////////////////////////////////////////////////////////////////////////////////////// ? ? ?Encouraged patient in the practice of positive behaviors including: ? ? ? ? ? ?Goal review and progress/challenges noted with patient. ? ?Next appointment within 2 weeks. ? ?This record has been created using [employer].  Chart  creation errors have been sought, but may not always have been located and corrected.  Such creation errors do not reflect on the standard of medical care  provided. ? ? ?[name], LCSW ? ? ? ? ? ? ? ? ? ? ? ? ? ? ? ? ? ? ?

## 2021-10-23 ENCOUNTER — Ambulatory Visit (INDEPENDENT_AMBULATORY_CARE_PROVIDER_SITE_OTHER): Payer: BC Managed Care – PPO | Admitting: Psychiatry

## 2021-10-23 DIAGNOSIS — F411 Generalized anxiety disorder: Secondary | ICD-10-CM | POA: Diagnosis not present

## 2021-10-23 NOTE — Progress Notes (Signed)
?    [facility] Counselor/Therapist Progress Note ? ?Patient ID: Shane [name], MRN: [id number],   ? ?Date: 10/23/2021 ? ?Time Spent: 55 minutes  ? ?Treatment Type: Individual Therapy ? ?Reported Symptoms: anxiety ? ?Mental Status Exam: ? ?Appearance:   Casual     ?Behavior:  Appropriate, Sharing, and Motivated  ?Motor:  Normal  ?Speech/Language:   Clear and Coherent  ?Affect:  anxious  ?Mood:  anxious  ?Thought process:  goal directed  ?Thought content:    overthinking  ?Sensory/Perceptual disturbances:    WNL  ?Orientation:  oriented to person, place, time/date, situation, day of week, month of year, year, and stated date of October 23, 2021  ?Attention:  Good  ?Concentration:  Good  ?Memory:  Mild issues with short term memory and dr is aware  ?Fund of knowledge:   Good  ?Insight:    Good  ?Judgment:   Good  ?Impulse Control:  Good  ? ?Risk Assessment: ?Danger to Self:  No ?Self-injurious Behavior: No ?Danger to Others: No ?Duty to Warn:no ?Physical Aggression / Violence:No  ?Access to Firearms a concern: No  ?Gang Involvement:No  ? ?Subjective:  In today reporting anxiety especially due to increased medical issues and wondering what the "future may look like". Reports recent diagnosis with congestive heart failure. Has gone from pre-diabetic to having diabetes, and elevated liver enzymes and thyroid issues. Finds himself jumping ahead to what "it will look like when I die, maybe into my 74's". Don't "necessarily feel sad but just not sure what the future holds." Denies any SI. Patient shares med history "since last September and still being followed by heart and liver doctor." Feels his meds are helping. He and wife are doing some better in relationship and "still working at it".  ? ?Interventions: Ego-Supportive and Insight-Oriented ? ?Plan:   ?Patient not signing tx updates on computer screen due to [place]  ?Treatment Goals: ?Goals remain on tx plan while patient works on strategies to meet his goals.  Progress is documented each session in the "Progress" section on Plan. ?Long Term Goal: ?Enhance the ability to handle effectively his depression and the full variety of life's anxieties. ? Short Term Goal:  ?Verbalize an understanding of how thoughts, physical feelings, and behavioral actions contribute to anxiety and anger and its treatment. ?Strategy:  ?Patient will work to recognize and interrupt triggers to his anxious or depressive thoughts  so as to replace with positive, more reality-based thoughts, which lead to more positive actions/behaviors. ? ? ?Diagnosis: ?  ICD-10-CM   ?1. Generalized anxiety disorder  F41.1   ?  ? ?Plan:  Patient in today reporting anxiety and some fear re: recent multiple health issues that he was able to share and process today which he states was very helpful. Does not talk to others much about so reported "some good relief" at end of session today.  Encouraged him to make notes if that would be helpful between sessions, and to follow all medical advice from his doctors, as he continues treatment for heart failure issues, diabetes, thyroid issues, and elevated liver enzymes.  Other areas of his life, including his marriage have alternately shown some progress since patient was last seen. Stated several times how much better he felt after being able to connect today in session and share and talk through a lot of his fears and concerns.  Also knows how important it is to try and stay in the present, follow-up on any and all medical advice, and  trying not to assume worst case scenarios as actually at this point he is showing some good response to medications that are being used to address his health concerns. Encouraged patient in the practice of more positive behaviors including: Staying in the present and focusing on what he can control versus cannot control, using deep breathing exercises to help with anxiety, practice more active listening skills with wife which can help with  potential conflict, consistently practice positive self talk, look intentionally for more positives versus negatives each day, remain on his medications as prescribed, exercise in accordance with Dr. Recommendations, letting go of things from the past that can hold him back, and recognize the strength he shows when working with goal-directed behaviors to move in a direction that supports his improved emotional health and overall wellbeing. ? ?Goal review and progress/challenges noted with patient. ? ?Next appointment within 2 weeks. ? ?This record has been created using [employer].  Chart creation errors have been sought, but may not always have been located and corrected.  Such creation errors do not reflect on the standard of medical care provided. ? ? ?[name], LCSW ? ? ? ? ? ? ? ? ? ? ? ? ? ? ? ? ? ? ?

## 2021-10-26 ENCOUNTER — Encounter: Payer: Self-pay | Admitting: Cardiovascular Disease

## 2021-10-26 ENCOUNTER — Ambulatory Visit (INDEPENDENT_AMBULATORY_CARE_PROVIDER_SITE_OTHER): Payer: BC Managed Care – PPO | Admitting: Cardiovascular Disease

## 2021-10-26 ENCOUNTER — Telehealth (HOSPITAL_COMMUNITY): Payer: Self-pay

## 2021-10-26 DIAGNOSIS — I493 Ventricular premature depolarization: Secondary | ICD-10-CM | POA: Diagnosis not present

## 2021-10-26 DIAGNOSIS — G4733 Obstructive sleep apnea (adult) (pediatric): Secondary | ICD-10-CM

## 2021-10-26 DIAGNOSIS — Z8571 Personal history of Hodgkin lymphoma: Secondary | ICD-10-CM

## 2021-10-26 DIAGNOSIS — Z951 Presence of aortocoronary bypass graft: Secondary | ICD-10-CM

## 2021-10-26 DIAGNOSIS — E785 Hyperlipidemia, unspecified: Secondary | ICD-10-CM

## 2021-10-26 DIAGNOSIS — I5020 Unspecified systolic (congestive) heart failure: Secondary | ICD-10-CM | POA: Diagnosis not present

## 2021-10-26 DIAGNOSIS — Z8659 Personal history of other mental and behavioral disorders: Secondary | ICD-10-CM

## 2021-10-26 DIAGNOSIS — I2510 Atherosclerotic heart disease of native coronary artery without angina pectoris: Secondary | ICD-10-CM

## 2021-10-26 NOTE — Patient Instructions (Signed)
Medication Instructions:  ?The current medical regimen is effective;  continue present plan and medications as directed. Please refer to the Current Medication list given to you today.  ? ?*If you need a refill on your cardiac medications before your next appointment, please call your pharmacy* ? ?Lab Work:    ?NONE    ? ?Testing/Procedures:  ?SLEEP STUDY IN MAY ? ?Follow-Up: ?Your next appointment:  IN AUGUST IF SLEEP STUDY IS POSITIVE   with DR Claiborne Billings    ? ?Please call our office 2 months in advance to schedule this appointment  ? ?At [facility], you and your health needs are our priority.  As part of our continuing mission to provide you with exceptional heart care, we have created designated Provider Care Teams.  These Care Teams include your primary Cardiologist (physician) and Advanced Practice Providers (APPs -  Physician Assistants and Nurse Practitioners) who all work together to provide you with the care you need, when you need it. ? ? ? ?Important Information About [employer] ? ? ? ? ? ? ? ?  ? ?

## 2021-10-26 NOTE — Telephone Encounter (Signed)
Called to confirm/remind patient of their appointment at the [facility] on 10/27/21.  ? ?Patient reminded to bring all medications and/or complete list. ? ?Confirmed patient has transportation. Gave directions, instructed to utilize [place] parking. ? ?Confirmed appointment prior to ending call.  ? ?

## 2021-10-26 NOTE — Progress Notes (Deleted)
Cardiology Office Note    Date:  10/26/2021   ID:  Shane [name], DOB 03/26/1966, MRN [id number]  PCP:  Willow Ora, MD  Cardiologist:  Nicki Guadalajara, MD   No chief complaint on file.   History of Present Illness:  Shane [name] is a 56 y.o. male ***    Past Medical History:  Diagnosis Date   CAD (coronary artery disease), s/p stent 2011 and CABG 2015 [facility])    Hodgkin disease [facility]) 1997   Hyperlipidemia    Macular degeneration    PVC's (premature ventricular contractions)     Past Surgical History:  Procedure Laterality Date   CARDIAC CATHETERIZATION  08/2009   PCI -LAD 2.75mmx18mm Xience   CORONARY ARTERY BYPASS GRAFT  02/2014   RIGHT/LEFT HEART CATH AND CORONARY ANGIOGRAPHY N/A 08/03/2021   Procedure: RIGHT/LEFT HEART CATH AND CORONARY ANGIOGRAPHY;  Surgeon: Runell Gess, MD;  Location: [facility] INV[facility] LAB;  Service: Cardiovascular;  Laterality: N/A;    Current Medications: Outpatient Medications Prior to Visit  Medication Sig Dispense Refill   acetaminophen (TYLENOL) 500 MG tablet Take 500 mg by mouth every 6 (six) hours as needed (pain).     ARIPiprazole (ABILIFY) 5 MG tablet Take 1 tablet (5 mg total) by mouth daily. 90 tablet 1   aspirin 81 MG tablet Take 81 mg by mouth daily.     Bevacizumab (AVASTIN IV) Inject 1 Dose into the eye every 6 (six) weeks.     carvedilol (COREG) 3.125 MG tablet Take 1 tablet by mouth twice daily. 180 tablet 3   ciprofloxacin (CILOXAN) 0.3 % ophthalmic solution SMARTSIG:In Eye(s)     dapagliflozin propanediol (FARXIGA) 10 MG TABS tablet Take 1 tablet (10 mg total) by mouth daily before breakfast. 30 tablet 6   escitalopram (LEXAPRO) 20 MG tablet Take 1 tablet (20 mg total) by mouth daily. 90 tablet 1   levothyroxine (SYNTHROID) 50 MCG tablet Take 1 tablet (50 mcg total) by mouth daily. 90 tablet 0   LORazepam (ATIVAN) 0.5 MG tablet Take 1 tablet (0.5 mg total) by mouth 2 (two) times daily. (Patient  taking differently: Take 0.5 mg by mouth 2 (two) times daily as needed (anxiety.).) 60 tablet 1   metFORMIN (GLUCOPHAGE-XR) 500 MG 24 hr tablet Take 2 tablets (1,000 mg total) by mouth daily with breakfast. 180 tablet 3   mexiletine (MEXITIL) 150 MG capsule Take 2 capsules (300 mg total) by mouth 2 (two) times daily. 120 capsule 3   Multiple Vitamins-Minerals (PRESERVISION AREDS 2 PO) Take 2 tablets by mouth in the morning.     nitroGLYCERIN (NITROSTAT) 0.4 MG SL tablet Place 1 tablet (0.4 mg total) under the tongue every 5 (five) minutes as needed for chest pain. 25 tablet 6   rosuvastatin (CRESTOR) 20 MG tablet Take 1 tablet by mouth daily. **Please call to schedule office visit prior to further refills** 90 tablet 3   sacubitril-valsartan (ENTRESTO) 24-26 MG Take 1 tablet by mouth 2 (two) times daily. 60 tablet 3   torsemide (DEMADEX) 20 MG tablet Take 1 tablet (20 mg total) by mouth daily as needed. For weight gain and/or swelling 30 tablet 5   ursodiol (ACTIGALL) 500 MG tablet Take 500 mg by mouth 2 (two) times daily.     No facility-administered medications prior to visit.     Allergies:   Patient has no known allergies.   Social History   Socioeconomic History   Marital status: Married  Spouse name: Not on file   Number of children: Not on file   Years of education: Not on file   Highest education level: Not on file  Occupational History   Not on file  Tobacco Use   Smoking status: Never   Smokeless tobacco: Former   Tobacco comments:    a long time ago  Vaping Use   Vaping Use: Never used  Substance and Sexual Activity   Alcohol use: Yes    Alcohol/week: 3.0 standard drinks    Types: 3 Cans of beer per week    Comment: sometimes 3 beers daily   Drug use: No   Sexual activity: Yes  Other Topics Concern   Not on file  Social History Narrative   Not on file   Social Determinants of Health   Financial Resource Strain: Not on file  Food Insecurity: Not on file   Transportation Needs: Not on file  Physical Activity: Not on file  Stress: Not on file  Social Connections: Not on file     Family History:  The patient's ***family history includes COPD in his father and mother; Cancer in his maternal grandmother and mother; Heart Problems in his brother and brother; Heart attack in his father; Heart disease in his father; Stroke in his mother.   ROS General: Negative; No fevers, chills, or night sweats;  HEENT: Negative; No changes in vision or hearing, sinus congestion, difficulty swallowing Pulmonary: Negative; No cough, wheezing, shortness of breath, hemoptysis Cardiovascular: Negative; No chest pain, presyncope, syncope, palpitations GI: Negative; No nausea, vomiting, diarrhea, or abdominal pain GU: Negative; No dysuria, hematuria, or difficulty voiding Musculoskeletal: Negative; no myalgias, joint pain, or weakness Hematologic/Oncology: Negative; no easy bruising, bleeding Endocrine: Negative; no heat/cold intolerance; no diabetes Neuro: Negative; no changes in balance, headaches Skin: Negative; No rashes or skin lesions Psychiatric: Negative; No behavioral problems, depression Sleep: Negative; No snoring, daytime sleepiness, hypersomnolence, bruxism, restless legs, hypnogognic hallucinations, no cataplexy Other comprehensive 14 point system review is negative.   PHYSICAL EXAM:   VS:  BP (!) 106/56   Pulse 70   Ht 5' 7.5" (1.715 m)   Wt 145 lb 3.2 oz (65.9 kg)   SpO2 97%   BMI 22.41 kg/m    Wt Readings from Last 3 Encounters:  10/26/21 145 lb 3.2 oz (65.9 kg)  10/08/21 143 lb 6.4 oz (65 kg)  09/17/21 139 lb 3.2 oz (63.1 kg)    General: Alert, oriented, no distress.  Skin: normal turgor, no rashes, warm and dry HEENT: Normocephalic, atraumatic. Pupils equal round and reactive to light; sclera anicteric; extraocular muscles intact; Fundi ** Nose without nasal septal hypertrophy Mouth/Parynx benign; Mallinpatti scale Neck: No JVD, no  carotid bruits; normal carotid upstroke Lungs: clear to ausculatation and percussion; no wheezing or rales Chest wall: without tenderness to palpitation Heart: PMI not displaced, RRR, s1 s2 normal, 1/6 systolic murmur, no diastolic murmur, no rubs, gallops, thrills, or heaves Abdomen: soft, nontender; no hepatosplenomehaly, BS+; abdominal aorta nontender and not dilated by palpation. Back: no CVA tenderness Pulses 2+ Musculoskeletal: full range of motion, normal strength, no joint deformities Extremities: no clubbing cyanosis or edema, Homan's sign negative  Neurologic: grossly nonfocal; Cranial nerves grossly wnl Psychologic: Normal mood and affect   Studies/Labs Reviewed:   EKG:  EKG is*** ordered today.  The ekg ordered today demonstrates ***  Recent Labs:    Latest Ref Rng & Units 10/08/2021   10:43 AM 10/01/2021    3:23 PM 09/17/2021  3:30 PM  BMP  Glucose 70 - 99 mg/dL 295   96   88    BUN 6 - 23 mg/dL 40   41   49    Creatinine 0.40 - 1.50 mg/dL 6.21   3.08   6.57    Sodium 135 - 145 mEq/L 137   142   136    Potassium 3.5 - 5.1 mEq/L 3.8   4.6   4.1    Chloride 96 - 112 mEq/L 99   102   94    CO2 19 - 32 mEq/L 27   32   31    Calcium 8.4 - 10.5 mg/dL 9.6   9.7   9.6          Latest Ref Rng & Units 10/08/2021   10:43 AM 09/08/2021    9:35 AM 07/24/2021   11:31 AM  Hepatic Function  Total Protein 6.0 - 8.3 g/dL 6.9   7.7   6.8    Albumin 3.5 - 5.2 g/dL 4.3   4.4   4.3    AST 0 - 37 U/L 26   26   21     ALT 0 - 53 U/L 26   25   16     Alk Phosphatase 39 - 117 U/L 299   160   232    Total Bilirubin 0.2 - 1.2 mg/dL 1.0   0.9   1.7    Bilirubin, Direct 0.0 - 0.3 mg/dL  0.3          Latest Ref Rng & Units 10/08/2021   10:43 AM 09/08/2021    9:35 AM 08/04/2021    2:47 AM  CBC  WBC 4.0 - 10.5 K/uL 7.6   4.6   6.5    Hemoglobin 13.0 - 17.0 g/dL 84.6   96.2   95.2    Hematocrit 39.0 - 52.0 % 39.0   45.3   42.7    Platelets 150.0 - 400.0 K/uL 249.0   230.0   255     Lab  Results  Component Value Date   MCV 92.3 10/08/2021   MCV 91.0 09/08/2021   MCV 91.8 08/04/2021   Lab Results  Component Value Date   TSH 62.93 (H) 10/08/2021   Lab Results  Component Value Date   HGBA1C 7.2 (H) 10/08/2021     BNP No results found for: BNP  ProBNP No results found for: PROBNP   Lipid Panel     Component Value Date/Time   CHOL 146 10/08/2021 1043   CHOL 109 10/19/2019 1146   TRIG 67.0 10/08/2021 1043   HDL 67.70 10/08/2021 1043   HDL 44 10/19/2019 1146   CHOLHDL 2 10/08/2021 1043   VLDL 13.4 10/08/2021 1043   LDLCALC 65 10/08/2021 1043   LDLCALC 50 10/19/2019 1146   LABVLDL 15 10/19/2019 1146     RADIOLOGY: CT Chest High Resolution  Result Date: 10/14/2021 CLINICAL DATA:  Pericardial effusion and pleural effusions reported by prior MR, mediastinal adenopathy, history of Hodgkin's disease, status post chemotherapy and radiation * Tracking Code: BO * EXAM: CT CHEST WITHOUT CONTRAST TECHNIQUE: Multidetector CT imaging of the chest was performed following the standard protocol without intravenous contrast. High resolution imaging of the lungs, as well as inspiratory and expiratory imaging, was performed. RADIATION DOSE REDUCTION: This exam was performed according to the departmental dose-optimization program which includes automated exposure control, adjustment of the mA and/or kV according to patient size and/or use of iterative reconstruction  technique. COMPARISON:  Cardiac MR, 08/04/2021, CT chest, 02/15/2014 FINDINGS: Cardiovascular: Aortic atherosclerosis. Normal heart size. Three-vessel coronary artery calcifications status post median sternotomy and CABG. No pericardial effusion. Mediastinum/Nodes: Multiple large, coarsely calcified mediastinal lymph nodes, unchanged compared to prior examination dated 02/15/2014. No enlarged noncalcified mediastinal or hilar lymph nodes. Thyroid gland, trachea, and esophagus demonstrate no significant findings.  Lungs/Pleura: No significant air trapping on expiratory phase imaging. No pleural effusion or pneumothorax. Upper Abdomen: No acute abnormality. Musculoskeletal: No chest wall abnormality. No suspicious osseous lesions identified. IMPRESSION: 1. Pleural effusions and pericardial effusion identified by prior cardiac MR are resolved. 2. Multiple large, coarsely calcified mediastinal lymph nodes, unchanged compared to prior examination dated 02/15/2014 and in keeping with treated Hodgkin's disease. No enlarged noncalcified mediastinal or hilar lymph nodes. 3. No evidence of fibrotic interstitial lung disease. 4. Coronary artery disease. Aortic Atherosclerosis (ICD10-I70.0). Electronically Signed   By: Jearld Lesch M.D.   On: 10/14/2021 15:43     Additional studies/ records that were reviewed today include:  ***    ASSESSMENT:    1. OSA on CPAP      PLAN:  ***   Medication Adjustments/Labs and Tests Ordered: Current medicines are reviewed at length with the patient today.  Concerns regarding medicines are outlined above.  Medication changes, Labs and Tests ordered today are listed in the Patient Instructions below. There are no Patient Instructions on file for this visit.   Signed, Nicki Guadalajara, MD  10/26/2021 10:11 AM    [facility] [address], [address], [place], [place]  [address] Phone: [phone]

## 2021-10-27 ENCOUNTER — Ambulatory Visit: Payer: BC Managed Care – PPO | Admitting: Adult Health

## 2021-10-27 ENCOUNTER — Ambulatory Visit (HOSPITAL_COMMUNITY)
Admission: RE | Admit: 2021-10-27 | Discharge: 2021-10-27 | Disposition: A | Discharge status: Home / Self Care | Payer: BC Managed Care – PPO | Source: Ambulatory Visit | Attending: Cardiology | Admitting: Cardiology

## 2021-10-27 ENCOUNTER — Other Ambulatory Visit: Payer: Self-pay

## 2021-10-27 ENCOUNTER — Encounter (HOSPITAL_COMMUNITY): Payer: Self-pay

## 2021-10-27 VITALS — BP 110/76 | HR 62 | Wt 146.4 lb

## 2021-10-27 DIAGNOSIS — I255 Ischemic cardiomyopathy: Secondary | ICD-10-CM | POA: Insufficient documentation

## 2021-10-27 DIAGNOSIS — I252 Old myocardial infarction: Secondary | ICD-10-CM | POA: Insufficient documentation

## 2021-10-27 DIAGNOSIS — Z9221 Personal history of antineoplastic chemotherapy: Secondary | ICD-10-CM | POA: Diagnosis not present

## 2021-10-27 DIAGNOSIS — I5020 Unspecified systolic (congestive) heart failure: Secondary | ICD-10-CM

## 2021-10-27 DIAGNOSIS — E039 Hypothyroidism, unspecified: Secondary | ICD-10-CM | POA: Diagnosis not present

## 2021-10-27 DIAGNOSIS — Z8249 Family history of ischemic heart disease and other diseases of the circulatory system: Secondary | ICD-10-CM | POA: Diagnosis not present

## 2021-10-27 DIAGNOSIS — Z955 Presence of coronary angioplasty implant and graft: Secondary | ICD-10-CM | POA: Diagnosis not present

## 2021-10-27 DIAGNOSIS — I493 Ventricular premature depolarization: Secondary | ICD-10-CM | POA: Insufficient documentation

## 2021-10-27 DIAGNOSIS — I110 Hypertensive heart disease with heart failure: Secondary | ICD-10-CM | POA: Insufficient documentation

## 2021-10-27 DIAGNOSIS — I5022 Chronic systolic (congestive) heart failure: Secondary | ICD-10-CM | POA: Insufficient documentation

## 2021-10-27 DIAGNOSIS — I25810 Atherosclerosis of coronary artery bypass graft(s) without angina pectoris: Secondary | ICD-10-CM | POA: Insufficient documentation

## 2021-10-27 DIAGNOSIS — E119 Type 2 diabetes mellitus without complications: Secondary | ICD-10-CM | POA: Diagnosis not present

## 2021-10-27 DIAGNOSIS — Z8572 Personal history of non-Hodgkin lymphomas: Secondary | ICD-10-CM | POA: Diagnosis not present

## 2021-10-27 DIAGNOSIS — Z923 Personal history of irradiation: Secondary | ICD-10-CM | POA: Diagnosis not present

## 2021-10-27 DIAGNOSIS — Z7984 Long term (current) use of oral hypoglycemic drugs: Secondary | ICD-10-CM | POA: Diagnosis not present

## 2021-10-27 DIAGNOSIS — Z79899 Other long term (current) drug therapy: Secondary | ICD-10-CM | POA: Diagnosis not present

## 2021-10-27 DIAGNOSIS — G4733 Obstructive sleep apnea (adult) (pediatric): Secondary | ICD-10-CM | POA: Insufficient documentation

## 2021-10-27 DIAGNOSIS — I3139 Other pericardial effusion (noninflammatory): Secondary | ICD-10-CM | POA: Diagnosis not present

## 2021-10-27 DIAGNOSIS — Z79890 Hormone replacement therapy (postmenopausal): Secondary | ICD-10-CM | POA: Insufficient documentation

## 2021-10-27 LAB — MAGNESIUM: Magnesium: 2.6 mg/dL — ABNORMAL HIGH (ref 1.7–2.4)

## 2021-10-27 LAB — BASIC METABOLIC PANEL
Anion gap: 8 (ref 5–15)
BUN: 21 mg/dL — ABNORMAL HIGH (ref 6–20)
CO2: 27 mmol/L (ref 22–32)
Calcium: 9.4 mg/dL (ref 8.9–10.3)
Chloride: 104 mmol/L (ref 98–111)
Creatinine, Ser: 1.2 mg/dL (ref 0.61–1.24)
GFR, Estimated: >60 mL/min (ref >60)
Glucose, Bld: 84 mg/dL (ref 70–99)
Potassium: 4.8 mmol/L (ref 3.5–5.1)
Sodium: 139 mmol/L (ref 135–145)

## 2021-10-27 LAB — BRAIN NATRIURETIC PEPTIDE: B Natriuretic Peptide: 992.9 pg/mL — ABNORMAL HIGH (ref 0.0–100.0)

## 2021-10-27 MED ORDER — LEVOTHYROXINE SODIUM 50 MCG PO TABS
50.0000 ug | ORAL_TABLET | Freq: Every day | ORAL | 0 refills | Status: DC
Start: 2021-10-27 — End: 2022-01-11

## 2021-10-27 NOTE — Progress Notes (Addendum)
? ?ADVANCED HF CLINIC PROGRESS NOTE ? ?Primary Care: Leamon Arnt, MD ?General Cardiologist: Dr. Gwenlyn Found ?HF Cardiologist: Dr. Haroldine Laws ? ? ?Reason for Visit: F/u chronic systolic heart failure ? ?HPI: ?Shane [name] is a 56 y.o. 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  ? ?Echo at time of CABG showed moderately reduced LVEF, 30-35%. EF improved post revascularization and had normalized, back to 50-55%, on repeat echo in 2016. In 2017, he underwent cardiac w/u for decrease exercise tolerance. Echo w/ normal LVEF, 50-55%, no significant valvular diease. NST was normal.  ?  ?Previously moderate drinker, found to have elevated LFTS and unintentional weight loss. Concern for primary biliary cirrhosis and underwent liver biopsy at [facility], showing nonspecific findings of diffuse congestion and sinusoidal dilation, that raised the possibility of elevated right-sided heart pressures. Biopsy did not suggest significant fibrosis or biliary injury (PBC). He was referred back to cardiology for echo.  ?  ?Echo 1/23 EF back down to 30-35%, RV mildly reduced, mild-mod MR. Mod TR.  ?  ?He underwent R/LHC on 08/03/20 which showed occluded native RCA and RCA vein graft with left-to-right collaterals. The remainder of his grafts were patent.  All his native arteries were occluded. RHC showed elevated right sided filling pressures and marginal CO (see hemodynamics below). He was direct admitted for IV diuretics and AHF consultation. He was also observed to have frequent PVCs ~25/min and previous records showed where prior Zio monitor had also showed high burden PVCs ~13%. He was placed on mexiletine for suppression but c/w frequent PVCs, was later switched to amio 200 mg bid. cRMI w/ findings c/w ischemic CM w/ coronary disease pattern LGE update/ scar. Also noted to have large posterior pericardial  effusion w/o tamponade as well as moderate TR. He was diuresed w/ IV Lasix and switched to po torsemide, diuresed 19 lb. GDMT initiated and he was discharged home, weight 148 lbs.  ? ?He had repeat Zio 2/23 while on amio + mexiletine. This showed reduction in PVC burden from 13 to 8.8%. He was subsequently referred to EP for PVC ablation. Seen by Dr. Curt Bears. Given concern for side effects w/ long term use of amio + pt preference, amiodarone was discontinued and mexiletine was increased to 300 mg bid. Dr. Curt Bears recommended repeat Zio to re quantify burden on monotherapy. Zio currently in process. At EP visit, his BP was also low and he complained of dizziness and spironolactone was discontinued. Torsemide changed to PRN. He is also being considered for ICD, pending f/u echo.  ? ?Also of note, his PCP recently check TFT and found to be hypothyroid. He has been started on levothyroxine 50 mcg daily.  ? ?He returns today for f/u. Feels well. No complaints. BP 110/76, pulse rate 62 bpm. No further dizziness. Wt is stable at 146 lb. Denies significant dyspnea w/ ADLs. Denies palpitations. ReDS 29%. ? ?Dr. Claiborne Billings following for OSA. He is scheduled for split night sleep study for CPAP titration.  ?  ?Cardiac Studies  ?- Echo (1/23): EF 30-35%, moderate LV dysfunction, global LV HK, RV mildly reduced, RVSP 30.7 mmHg, mild to mod MR, moderate TR ?  ?- R/LHC 08/03/21:  occluded RCA and RCA vein graft with left-to-right collaterals.  The remainder of his grafts are patent.  All his native arteries were occluded. ?  ?Hemodynamics: ?1: Right  atrial pressure-17/17 ?2: Right ventricular pressure-51/23, mean 27 ?3: Pulmonary artery pressure-57/9, mean 35 ?4: Pulmonary wedge pressure-A-wave 28, V wave 24, mean 17 ?5: Cardiac output-3.7 L/min with an index of 2 L/min/m? by Fick ?6: LVEDP-24 ?  ?- Cardiac MRI 08/04/21: ?Ischemic scar pattern.  ?LVEF 29% ?RVEF 41% ?Mod TR ?Large posterior pericardial effusion w/o tamponade  ? ?Zio 2/23  - PVC burden 8.8%  ?  ?Review of Systems: [y] = yes, '[ ]'$  = no  ? ?General: Weight gain '[ ]'$ ; Weight loss '[ ]'$ ; Anorexia '[ ]'$ ; Fatigue '[ ]'$ ; Fever '[ ]'$ ; Chills '[ ]'$ ; Weakness '[ ]'$   ?Cardiac: Chest pain/pressure '[ ]'$ ; Resting SOB '[ ]'$ ; Exertional SOB '[ ]'$ ; Orthopnea '[ ]'$ ; Pedal Edema '[ ]'$ ; Palpitations '[ ]'$ ; Syncope '[ ]'$ ; Presyncope '[ ]'$ ; Paroxysmal nocturnal dyspnea'[ ]'$   ?Pulmonary: Cough '[ ]'$ ; Wheezing'[ ]'$ ; Hemoptysis'[ ]'$ ; Sputum '[ ]'$ ; Snoring [y]  ?GI: Vomiting'[ ]'$ ; Dysphagia'[ ]'$ ; Melena'[ ]'$ ; Hematochezia '[ ]'$ ; Heartburn'[ ]'$ ; Abdominal pain '[ ]'$ ; Constipation '[ ]'$ ; Diarrhea '[ ]'$ ; BRBPR '[ ]'$   ?GU: Hematuria'[ ]'$ ; Dysuria '[ ]'$ ; Nocturia'[ ]'$   ?Vascular: Pain in legs with walking '[ ]'$ ; Pain in feet with lying flat '[ ]'$ ; Non-healing sores '[ ]'$ ; Stroke '[ ]'$ ; TIA '[ ]'$ ; Slurred speech '[ ]'$ ;  ?Neuro: Headaches'[ ]'$ ; Vertigo'[ ]'$ ; Seizures'[ ]'$ ; Paresthesias'[ ]'$ ;Blurred vision '[ ]'$ ; Diplopia '[ ]'$ ; Vision changes '[ ]'$   ?Ortho/Skin: Arthritis '[ ]'$ ; Joint pain '[ ]'$ ; Muscle pain '[ ]'$ ; Joint swelling '[ ]'$ ; Back Pain '[ ]'$ ; Rash '[ ]'$   ?Psych: Depression'[ ]'$ ; Anxiety'[ ]'$   ?Heme: Bleeding problems '[ ]'$ ; Clotting disorders '[ ]'$ ; Anemia '[ ]'$   ?Endocrine: Diabetes '[ ]'$ ; Thyroid dysfunction[y ] ? ?Past Medical History:  ?Diagnosis Date  ? CAD (coronary artery disease), s/p stent 2011 and CABG 2015 [facility])   ? Hodgkin disease ([place]) 1997  ? Hyperlipidemia   ? Macular degeneration   ? PVC's (premature ventricular contractions)   ? ?Current Outpatient Medications  ?Medication Sig Dispense Refill  ? acetaminophen (TYLENOL) 500 MG tablet Take 500 mg by mouth every 6 (six) hours as needed (pain).    ? ARIPiprazole (ABILIFY) 5 MG tablet Take 1 tablet (5 mg total) by mouth daily. 90 tablet 1  ? aspirin 81 MG tablet Take 81 mg by mouth daily.    ? Bevacizumab (AVASTIN IV) Inject 1 Dose into the eye every 6 (six) weeks.    ? carvedilol (COREG) 3.125 MG tablet Take 1 tablet by mouth twice daily. 180 tablet 3  ? ciprofloxacin (CILOXAN) 0.3 % ophthalmic solution SMARTSIG:In Eye(s)    ?  dapagliflozin propanediol (FARXIGA) 10 MG TABS tablet Take 1 tablet (10 mg total) by mouth daily before breakfast. 30 tablet 6  ? escitalopram (LEXAPRO) 20 MG tablet Take 1 tablet (20 mg total) by mouth daily. 90 tablet 1  ? levothyroxine (SYNTHROID) 50 MCG tablet Take 1 tablet (50 mcg total) by mouth daily. 90 tablet 0  ? LORazepam (ATIVAN) 0.5 MG tablet Take 1 tablet (0.5 mg total) by mouth 2 (two) times daily. (Patient taking differently: Take 0.5 mg by mouth 2 (two) times daily as needed (anxiety.).) 60 tablet 1  ? metFORMIN (GLUCOPHAGE-XR) 500 MG 24 hr tablet Take 2 tablets (1,000 mg total) by mouth daily with breakfast. 180 tablet 3  ? mexiletine (MEXITIL) 150 MG capsule Take 2 capsules (300 mg total) by mouth 2 (two) times daily. 120 capsule 3  ? Multiple Vitamins-Minerals (PRESERVISION AREDS 2 PO)  Take 2 tablets by mouth in the morning.    ? nitroGLYCERIN (NITROSTAT) 0.4 MG SL tablet Place 1 tablet (0.4 mg total) under the tongue every 5 (five) minutes as needed for chest pain. 25 tablet 6  ? rosuvastatin (CRESTOR) 20 MG tablet Take 1 tablet by mouth daily. **Please call to schedule office visit prior to further refills** 90 tablet 3  ? sacubitril-valsartan (ENTRESTO) 24-26 MG Take 1 tablet by mouth 2 (two) times daily. 60 tablet 3  ? torsemide (DEMADEX) 20 MG tablet Take 1 tablet (20 mg total) by mouth daily as needed. For weight gain and/or swelling 30 tablet 5  ? ursodiol (ACTIGALL) 500 MG tablet Take 500 mg by mouth 2 (two) times daily.    ? ?No current facility-administered medications for this encounter.  ? ?No Known Allergies ? ?Social History  ? ?Socioeconomic History  ? Marital status: Married  ?  Spouse name: Not on file  ? Number of children: Not on file  ? Years of education: Not on file  ? Highest education level: Not on file  ?Occupational History  ? Not on file  ?Tobacco Use  ? Smoking status: Never  ? Smokeless tobacco: Former  ? Tobacco comments:  ?  a long time ago  ?Vaping Use  ? Vaping  Use: Never used  ?Substance and Sexual Activity  ? Alcohol use: Yes  ?  Alcohol/week: 3.0 standard drinks  ?  Types: 3 Cans of beer per week  ?  Comment: sometimes 3 beers daily  ? Drug use: No  ? Sexual activity:

## 2021-10-27 NOTE — Progress Notes (Signed)
ReDS Vest / Clip - 10/27/21 1400   ? ?  ? ReDS Vest / Clip  ? Station Marker C   ? Ruler Value 28   ? ReDS Value Range Low volume   ? ReDS Actual Value 29   ? ?  ?  ? ?  ? ? ?

## 2021-10-27 NOTE — Patient Instructions (Signed)
It was great to see you today! ?No medication changes are needed at this time. ? ? ?Labs today ?We will only contact you if something comes back abnormal or we need to make some changes. ?Otherwise no news is good news! ? ?Keep follow up as scheduled with Dr Haroldine Laws ? ? ?Do the following things EVERYDAY: ?Weigh yourself in the morning before breakfast. Write it down and keep it in a log. ?Take your medicines as prescribed ?Eat low salt foods--Limit salt (sodium) to 2000 mg per day.  ?Stay as active as you can everyday ?Limit all fluids for the day to less than 2 liters ? ?At the [facility], you and your health needs are our priority. As part of our continuing mission to provide you with exceptional heart care, we have created designated Provider Care Teams. These Care Teams include your primary Cardiologist (physician) and Advanced Practice Providers (APPs- Physician Assistants and Nurse Practitioners) who all work together to provide you with the care you need, when you need it.  ? ?You may see any of the following providers on your designated Care Team at your next follow up: ?Dr Glori Bickers ?Dr Loralie Champagne ?Darrick Grinder, NP ?Lyda Jester, PA ?Jessica Milford,NP ?Marlyce Huge, PA ?Audry Riles, PharmD ? ? ?Please be sure to bring in all your medications bottles to every appointment.  ? ?If you have any questions or concerns before your next appointment please send [place] a message through [place] or call our office at [phone].   ? ?TO LEAVE A MESSAGE FOR THE NURSE SELECT OPTION 2, PLEASE LEAVE A MESSAGE INCLUDING: ?YOUR NAME ?DATE OF BIRTH ?CALL BACK NUMBER ?REASON FOR CALL**this is important as we prioritize the call backs ? ?YOU WILL RECEIVE A CALL BACK THE SAME DAY AS LONG AS YOU CALL BEFORE 4:00 PM ? ? ?

## 2021-10-29 ENCOUNTER — Telehealth (HOSPITAL_COMMUNITY): Payer: Self-pay | Admitting: Cardiology

## 2021-10-29 ENCOUNTER — Telehealth: Payer: Self-pay | Admitting: Internal Medicine

## 2021-10-29 DIAGNOSIS — I5022 Chronic systolic (congestive) heart failure: Secondary | ICD-10-CM

## 2021-10-29 MED ORDER — TORSEMIDE 20 MG PO TABS: 20.0000 mg | ORAL_TABLET | Freq: Every day | ORAL | 5 refills | Status: DC

## 2021-10-29 NOTE — Telephone Encounter (Signed)
Patient called.  Patient aware.  

## 2021-10-29 NOTE — Telephone Encounter (Signed)
-----   Message from Consuelo Pandy, [place] sent at 10/27/2021  4:33 PM EDT ----- ?Fluid marker is elevated. Should go back to taking Torsemide 20 mg daily. Check BMP and BNP again in 1 wk  ?

## 2021-10-29 NOTE — Telephone Encounter (Signed)
? ?  Autoimmune - negative ?Quantiferon gold - negative ?CT - effusion resolved, Calcified nodes are unchanged since 2015 ? ?OVerall reassuring ? ?Plan ?  - ensure followup per recent OV ? ?CT ?IMPRESSION: ?1. Pleural effusions and pericardial effusion identified by prior ?cardiac MR are resolved. ?2. Multiple large, coarsely calcified mediastinal lymph nodes, ?unchanged compared to prior examination dated 02/15/2014 and in ?keeping with treated Hodgkin's disease. No enlarged noncalcified ?mediastinal or hilar lymph nodes. ?3. No evidence of fibrotic interstitial lung disease. ?4. Coronary artery disease. ?  ?Aortic Atherosclerosis (ICD10-I70.0). ?  ?  ?Electronically Signed ?  By: Delanna Ahmadi M.D. ?  On: 10/14/2021 15:43 ?

## 2021-10-30 ENCOUNTER — Encounter: Payer: Self-pay | Admitting: Cardiovascular Disease

## 2021-10-30 ENCOUNTER — Encounter: Payer: Self-pay | Admitting: Adult Health

## 2021-10-30 ENCOUNTER — Ambulatory Visit: Payer: BC Managed Care – PPO | Admitting: Adult Health

## 2021-10-30 DIAGNOSIS — F39 Unspecified mood [affective] disorder: Secondary | ICD-10-CM

## 2021-10-30 DIAGNOSIS — F411 Generalized anxiety disorder: Secondary | ICD-10-CM | POA: Diagnosis not present

## 2021-10-30 DIAGNOSIS — F321 Major depressive disorder, single episode, moderate: Secondary | ICD-10-CM | POA: Diagnosis not present

## 2021-10-30 DIAGNOSIS — F410 Panic disorder [episodic paroxysmal anxiety] without agoraphobia: Secondary | ICD-10-CM

## 2021-10-30 MED ORDER — ESCITALOPRAM OXALATE 20 MG PO TABS: 20.0000 mg | ORAL_TABLET | Freq: Every day | ORAL | 1 refills | Status: DC

## 2021-10-30 MED ORDER — ARIPIPRAZOLE 5 MG PO TABS: 5.0000 mg | ORAL_TABLET | Freq: Every day | ORAL | 1 refills | Status: DC

## 2021-10-30 MED ORDER — LORAZEPAM 0.5 MG PO TABS: 0.5000 mg | ORAL_TABLET | ORAL | 2 refills | Status: DC | PRN

## 2021-10-30 NOTE — Telephone Encounter (Signed)
Called and spoke with pt letting him know the results of recent testing per MR and he verbalized understanding. Have scheduled a video visit with [facility] Tuesday 4/25 to have all further discussed and decide next steps. Nothing further needed. ?

## 2021-10-30 NOTE — Progress Notes (Signed)
? ?Cardiology Office Note   ? ?Date:  10/30/2021  ? ?ID:  Shane [name], DOB Jul 03, 1966, MRN [id number] ? ?PCP:  Leamon Arnt, MD  ?Cardiologist:  Shelva Majestic, MD (sleep); Dr. Gwenlyn Found ? ?4 month F/U sleep evaluation  ? ? ?History of Present Illness:  ?Shane [name] is a 56 y.o. male who is followed by Dr. Gwenlyn Found for cardiology care.  He has a strong family history of CAD and has a history of prior Hodgkin's disease in 1997, macular degeneration, and hyperlipidemia.  He suffered an anterior wall myocardial infarction on August 28, 2009.  At that time, a Xience DES stent was placed in his proximal LAD.  Due to subsequent recurrent symptomatology in August 2015 he underwent CABG revascularization at [facility] with a LIMA to his LAD, and had SVGs placed to his ramus, OM, and RCA.  A nuclear stress test in December 2017 was low risk and nonischemic.  2D echo revealed preserved LV function with EF at 50 to 55%.  Earlier this year he was diagnosed with primary biliary cirrhosis and is undergoing evaluation at [facility].  He will need a future liver biopsy.  ? ?In 2020, he had a sleep study done at [facility] neurology and saw Dr. Brett Fairy.  At that time, he had a history of loud snoring, and awakening gasping for breath.  A sleep study apparently demonstrated moderately severe sleep apnea with a P RDI of 34.3 being most pronounced in non-REM sleep at 37.2 compared to REM sleep at 18.8/h.  PHI was 20.8/h.  He was started on AutoPap therapy and saw Vaughan Browner, NP on August 20 for follow-up evaluation.  Apparently, he used CPAP intermittently and ultimately completely stopped using therapy in April 2022.  He did not tolerate the fullface mask which resulted in some pressure on the bridge of his nose.  He saw Dr. Gwenlyn Found on June 09, 2021.  During Dr. Kennon Holter evaluation he suggested he see me for reassessment of his sleep apnea and discussions of possible alternatives. ? ?I saw him for my initial  sleep evaluation on June 12, 2021.  At that time, I had a very lengthy discussion with him regarding potential adverse cardiovascular consequences of untreated sleep apnea.  After very thorough and comprehensive discussion, he wished to pursue another trial of CPAP.  I provided him with a new ResMed AirFit F30i mask.  I discussed optimal sleep duration and recommended reevaluation and scheduled him for an in lab split-night study. ? ?Apparently he had subsequently been admitted to the hospital with acute on chronic biventricular systolic heart failure and was hospitalized in January 23 to August 06, 2021.  He had undergone a right and left heart catheterization.  He had an occluded native RCA and RCA vein graft with left-to-right collaterals.  The remainder of his grafts were patent.  He was observed to have frequent PVCs approximately 25/min and was placed on mexiletine for suppression but with frequent PVCs was later switched to amiodarone.  Subsequent to his hospitalization his split-night study was canceled and Dr. Haroldine Laws set him up for an Itamar study.  His Itamar study was interpreted by Dr. Radford Pax and per her report there are was no evidence for obstructive sleep apnea with an AHI of 3.3/h.  However, I am concerned that this may be a false negative study particularly with oxygen desaturations as low as 78% in this patient with significantly reduced LV function.  His most recent echo Doppler study done  on August 05, 2021 showed an EF of 25% with global hypokinesis ? ?He presents back to me for follow-up evaluation. ? ?Past Medical History:  ?Diagnosis Date  ? CAD (coronary artery disease), s/p stent 2011 and CABG 2015 [facility])   ? Hodgkin disease ([place]) 1997  ? Hyperlipidemia   ? Macular degeneration   ? PVC's (premature ventricular contractions)   ? ? ?Past Surgical History:  ?Procedure Laterality Date  ? CARDIAC CATHETERIZATION  08/2009  ? PCI -LAD 2.12mx18mm Xience  ? CORONARY ARTERY  BYPASS GRAFT  02/2014  ? RIGHT/LEFT HEART CATH AND CORONARY ANGIOGRAPHY N/A 08/03/2021  ? Procedure: RIGHT/LEFT HEART CATH AND CORONARY ANGIOGRAPHY;  Surgeon: BLorretta Harp MD;  [facility];  Service: Cardiovascular;  Laterality: N/A;  ? ? ?Current Medications: ?Outpatient Medications Prior to Visit  ?Medication Sig Dispense Refill  ? acetaminophen (TYLENOL) 500 MG tablet Take 500 mg by mouth every 6 (six) hours as needed (pain).    ? aspirin 81 MG tablet Take 81 mg by mouth daily.    ? Bevacizumab (AVASTIN IV) Inject 1 Dose into the eye every 6 (six) weeks.    ? carvedilol (COREG) 3.125 MG tablet Take 1 tablet by mouth twice daily. 180 tablet 3  ? ciprofloxacin (CILOXAN) 0.3 % ophthalmic solution Eye injection every 6-8 weeks    ? dapagliflozin propanediol (FARXIGA) 10 MG TABS tablet Take 1 tablet (10 mg total) by mouth daily before breakfast. 30 tablet 6  ? metFORMIN (GLUCOPHAGE-XR) 500 MG 24 hr tablet Take 2 tablets (1,000 mg total) by mouth daily with breakfast. 180 tablet 3  ? mexiletine (MEXITIL) 150 MG capsule Take 2 capsules (300 mg total) by mouth 2 (two) times daily. 120 capsule 3  ? Multiple Vitamins-Minerals (PRESERVISION AREDS 2 PO) Take 2 tablets by mouth in the morning.    ? nitroGLYCERIN (NITROSTAT) 0.4 MG SL tablet Place 1 tablet (0.4 mg total) under the tongue every 5 (five) minutes as needed for chest pain. 25 tablet 6  ? rosuvastatin (CRESTOR) 20 MG tablet Take 1 tablet by mouth daily. **Please call to schedule office visit prior to further refills** 90 tablet 3  ? sacubitril-valsartan (ENTRESTO) 24-26 MG Take 1 tablet by mouth 2 (two) times daily. 60 tablet 3  ? ursodiol (ACTIGALL) 500 MG tablet Take 500 mg by mouth 2 (two) times daily.    ? ARIPiprazole (ABILIFY) 5 MG tablet Take 1 tablet (5 mg total) by mouth daily. 90 tablet 1  ? escitalopram (LEXAPRO) 20 MG tablet Take 1 tablet (20 mg total) by mouth daily. 90 tablet 1  ? levothyroxine (SYNTHROID) 50 MCG tablet Take 1  tablet (50 mcg total) by mouth daily. 90 tablet 0  ? LORazepam (ATIVAN) 0.5 MG tablet Take 1 tablet (0.5 mg total) by mouth 2 (two) times daily. (Patient taking differently: Take 0.5 mg by mouth 2 (two) times daily as needed (anxiety.).) 60 tablet 1  ? torsemide (DEMADEX) 20 MG tablet Take 1 tablet (20 mg total) by mouth daily as needed. For weight gain and/or swelling 30 tablet 5  ? ?No facility-administered medications prior to visit.  ?  ? ?Allergies:   Patient has no known allergies.  ? ?Social History  ? ?Socioeconomic History  ? Marital status: Married  ?  Spouse name: Not on file  ? Number of children: Not on file  ? Years of education: Not on file  ? Highest education level: Not on file  ?Occupational History  ?  Not on file  ?Tobacco Use  ? Smoking status: Never  ? Smokeless tobacco: Former  ? Tobacco comments:  ?  a long time ago  ?Vaping Use  ? Vaping Use: Never used  ?Substance and Sexual Activity  ? Alcohol use: Yes  ?  Alcohol/week: 3.0 standard drinks  ?  Types: 3 Cans of beer per week  ?  Comment: sometimes 3 beers daily  ? Drug use: No  ? Sexual activity: Yes  ?Other Topics Concern  ? Not on file  ?Social History Narrative  ? Not on file  ? ?Social Determinants of Health  ? ?Financial Resource Strain: Not on file  ?Food Insecurity: Not on file  ?Transportation Needs: Not on file  ?Physical Activity: Not on file  ?Stress: Not on file  ?Social Connections: Not on file  ?  ?Socially, he was born in [place] and lived there for 25 years.  He is in his second marriage and has been married for 5 years.  He has 2 children ages 64 and 79 from his first marriage.  He works at [employer].  ? ?Family History:  The patient's family history includes COPD in his father and mother; Cancer in his maternal grandmother and mother; Heart Problems in his brother and brother; Heart attack in his father; Heart disease in his father; Stroke in his mother.  ? ?ROS ?General: Negative; No fevers, chills, or night sweats;  ?HEENT:  Negative; No changes in vision or hearing, sinus congestion, difficulty swallowing ?Pulmonary: Negative; No cough, wheezing, shortness of breath, hemoptysis ?Cardiovascular: History of anterior MI 2011;, st

## 2021-10-30 NOTE — Progress Notes (Signed)
Shane [name] ?[id number] ?31-Jan-1966 ?56 y.o. ? ?Subjective:  ? ?Patient ID:  Shane [name] is a 56 y.o. (DOB [birth year]) male. ? ?Chief Complaint: No chief complaint on file. ? ? ?HPI ?Shane [name] presents to the office today for follow-up of GAD, MDD, insomnia, mood disorder, panic attacks.  ? ?Describes mood today as "ok". Pleasant. Mood symptoms - denies depression, anxiety, and irritability. Denies anxiety attacks. Mood is consistent. Stating "I'm doing alright". He and wife doing well. Stable interest and motivation. Taking medications as prescribed.  ?Energy levels stable. Active, has a regular exercise routine.  ?Enjoys some usual interests and activities. Married. Lives with wife. Has 2 grown sons. Family in [place].  ?Appetite adequate. Weight loss - 143 from 170 pounds.  ?Sleeps well most nights. Averages 6 to 7 hours. Has not been using CPAP machine. ?Focus and concentration stable. Completing tasks. Managing aspects of household. Work going well [employer]. ?Denies SI or HI.  ?Denies AH or VH. ? ? ? ? ?GAD-7   ? ?[place] Office Visit from 05/24/2018 in [place] Visit from 04/03/2018 in [place]  ?Total GAD-7 Score 2 9  ? ?  ? ?PHQ2-9   ? ?[place] Office Visit from 10/08/2021 in [place] Visit from 04/10/2021 in [place] Visit from 10/09/2020 in [place] Visit from 12/03/2019 in [place] Visit from 04/03/2019 in [place]  ?PHQ-2 Total Score 0 0 0 0 1  ?PHQ-9 Total Score 2 2 -- 1 4  ? ?  ? ?Flowsheet Row Admission (Discharged) from 08/03/2021 in [facility]  ?C-SSRS RISK CATEGORY No Risk  ? ?  ?  ? ?Review of Systems:  ?Review of Systems  ?Musculoskeletal:  Negative for gait problem.  ?Neurological:  Negative for tremors.   ?Psychiatric/Behavioral:    ?     Please refer to HPI  ? ?Medications: I have reviewed the patient's current medications. ? ?Current Outpatient Medications  ?Medication Sig Dispense Refill  ? acetaminophen (TYLENOL) 500 MG tablet Take 500 mg by mouth every 6 (six) hours as needed (pain).    ? ARIPiprazole (ABILIFY) 5 MG tablet Take 1 tablet (5 mg total) by mouth daily. 90 tablet 1  ? aspirin 81 MG tablet Take 81 mg by mouth daily.    ? Bevacizumab (AVASTIN IV) Inject 1 Dose into the eye every 6 (six) weeks.    ? carvedilol (COREG) 3.125 MG tablet Take 1 tablet by mouth twice daily. 180 tablet 3  ? ciprofloxacin (CILOXAN) 0.3 % ophthalmic solution Eye injection every 6-8 weeks    ? dapagliflozin propanediol (FARXIGA) 10 MG TABS tablet Take 1 tablet (10 mg total) by mouth daily before breakfast. 30 tablet 6  ? escitalopram (LEXAPRO) 20 MG tablet Take 1 tablet (20 mg total) by mouth daily. 90 tablet 1  ? levothyroxine (SYNTHROID) 50 MCG tablet Take 1 tablet (50 mcg total) by mouth daily. 90 tablet 0  ? LORazepam (ATIVAN) 0.5 MG tablet Take 1 tablet (0.5 mg total) by mouth as needed for anxiety. 30 tablet 2  ? metFORMIN (GLUCOPHAGE-XR) 500 MG 24 hr tablet Take 2 tablets (1,000 mg total) by mouth daily with breakfast. 180 tablet 3  ? mexiletine (MEXITIL) 150 MG capsule Take 2 capsules (300 mg total) by mouth 2 (two) times daily. 120 capsule 3  ? Multiple Vitamins-Minerals (PRESERVISION AREDS  2 PO) Take 2 tablets by mouth in the morning.    ? nitroGLYCERIN (NITROSTAT) 0.4 MG SL tablet Place 1 tablet (0.4 mg total) under the tongue every 5 (five) minutes as needed for chest pain. 25 tablet 6  ? rosuvastatin (CRESTOR) 20 MG tablet Take 1 tablet by mouth daily. **Please call to schedule office visit prior to further refills** 90 tablet 3  ? sacubitril-valsartan (ENTRESTO) 24-26 MG Take 1 tablet by mouth 2 (two) times daily. 60 tablet 3  ? torsemide (DEMADEX) 20 MG tablet Take 1 tablet (20 mg total) by mouth daily. For  weight gain and/or swelling 30 tablet 5  ? ursodiol (ACTIGALL) 500 MG tablet Take 500 mg by mouth 2 (two) times daily.    ? ?No current facility-administered medications for this visit.  ? ? ?Medication Side Effects: None ? ?Allergies: No Known Allergies ? ?Past Medical History:  ?Diagnosis Date  ? CAD (coronary artery disease), s/p stent 2011 and CABG 2015 [facility])   ? Hodgkin disease ([place]) 1997  ? Hyperlipidemia   ? Macular degeneration   ? PVC's (premature ventricular contractions)   ? ? ?Past Medical History, Surgical history, Social history, and Family history were reviewed and updated as appropriate.  ? ?Please see review of systems for further details on the patient's review from today.  ? ?Objective:  ? ?Physical Exam:  ?There were no vitals taken for this visit. ? ?Physical Exam ?Constitutional:   ?   General: He is not in acute distress. ?Musculoskeletal:     ?   General: No deformity.  ?Neurological:  ?   Mental Status: He is alert and oriented to person, place, and time.  ?   Coordination: Coordination normal.  ?Psychiatric:     ?   Attention and Perception: Attention and perception normal. He does not perceive auditory or visual hallucinations.     ?   Mood and Affect: Mood normal. Mood is not anxious or depressed. Affect is not labile, blunt, angry or inappropriate.     ?   Speech: Speech normal.     ?   Behavior: Behavior normal.     ?   Thought Content: Thought content normal. Thought content is not paranoid or delusional. Thought content does not include homicidal or suicidal ideation. Thought content does not include homicidal or suicidal plan.     ?   Cognition and Memory: Cognition and memory normal.     ?   Judgment: Judgment normal.  ?   Comments: Insight intact  ? ? ?Lab Review:  ?   ?Component Value Date/Time  ? NA 139 10/27/2021 1444  ? NA 133 (L) 08/27/2021 1122  ? NA 141 12/24/2015 1051  ? K 4.8 10/27/2021 1444  ? K 4.6 12/24/2015 1051  ? CL 104 10/27/2021 1444  ? CO2 27  10/27/2021 1444  ? CO2 28 12/24/2015 1051  ? GLUCOSE 84 10/27/2021 1444  ? GLUCOSE 95 12/24/2015 1051  ? BUN 21 (H) 10/27/2021 1444  ? BUN 50 (H) 08/27/2021 1122  ? BUN 14.6 12/24/2015 1051  ? CREATININE 1.20 10/27/2021 1444  ? CREATININE 1.1 12/24/2015 1051  ? CALCIUM 9.4 10/27/2021 1444  ? CALCIUM 9.8 12/24/2015 1051  ? PROT 6.9 10/08/2021 1043  ? PROT 6.8 07/24/2021 1131  ? PROT 7.6 12/24/2015 1051  ? ALBUMIN 4.3 10/08/2021 1043  ? ALBUMIN 4.3 07/24/2021 1131  ? ALBUMIN 4.5 12/24/2015 1051  ? AST 26 10/08/2021 1043  ? AST 25 12/24/2015  1051  ? ALT 26 10/08/2021 1043  ? ALT 34 12/24/2015 1051  ? ALKPHOS 299 (H) 10/08/2021 1043  ? ALKPHOS 45 12/24/2015 1051  ? BILITOT 1.0 10/08/2021 1043  ? BILITOT 1.7 (H) 07/24/2021 1131  ? BILITOT 0.99 12/24/2015 1051  ? GFRNONAA >60 10/27/2021 1444  ? ? ?   ?Component Value Date/Time  ? WBC 7.6 10/08/2021 1043  ? RBC 4.22 10/08/2021 1043  ? HGB 13.0 10/08/2021 1043  ? HGB 14.1 07/24/2021 1131  ? HGB 15.7 12/24/2015 1050  ? HCT 39.0 10/08/2021 1043  ? HCT 42.1 07/24/2021 1131  ? HCT 44.7 12/24/2015 1050  ? PLT 249.0 10/08/2021 1043  ? PLT 228 07/24/2021 1131  ? MCV 92.3 10/08/2021 1043  ? MCV 92 07/24/2021 1131  ? MCV 93 12/24/2015 1050  ? MCH 30.1 08/04/2021 0247  ? MCHC 33.5 10/08/2021 1043  ? RDW 16.4 (H) 10/08/2021 1043  ? RDW 14.3 07/24/2021 1131  ? RDW 12.9 12/24/2015 1050  ? LYMPHSABS 1.0 10/08/2021 1043  ? LYMPHSABS 1.3 12/24/2015 1050  ? MONOABS 0.6 10/08/2021 1043  ? EOSABS 0.1 10/08/2021 1043  ? EOSABS 0.1 12/24/2015 1050  ? BASOSABS 0.1 10/08/2021 1043  ? BASOSABS 0.0 12/24/2015 1050  ? ? ?No results found for: POCLITH, LITHIUM  ? ?No results found for: PHENYTOIN, PHENOBARB, VALPROATE, CBMZ  ? ?.res ?Assessment: Plan:   ? ?Assessment: Plan:   ? ?Plan: ? ?1. Continue Lexapro '20mg'$  daily ?2. Continue Ativan 0.'5mg'$  BID - takes occasionally. ?3. Continue Abilify '5mg'$  daily. ? ?Continue therapy with Rinaldo Cloud.  ? ?RTC 6 months ? ?Patient advised to contact office with any  questions, adverse effects, or acute worsening in signs and symptoms. ? ?Discussed potential benefits, risk, and side effects of benzodiazepines to include potential risk of tolerance and dependence, as well as possible drowsiness.  Ad

## 2021-11-03 ENCOUNTER — Telehealth (INDEPENDENT_AMBULATORY_CARE_PROVIDER_SITE_OTHER): Payer: BC Managed Care – PPO | Admitting: Nurse Practitioner

## 2021-11-03 ENCOUNTER — Telehealth: Payer: Self-pay | Admitting: Adult Health

## 2021-11-03 ENCOUNTER — Other Ambulatory Visit: Payer: Self-pay

## 2021-11-03 DIAGNOSIS — F411 Generalized anxiety disorder: Secondary | ICD-10-CM

## 2021-11-03 DIAGNOSIS — I898 Other specified noninfective disorders of lymphatic vessels and lymph nodes: Secondary | ICD-10-CM

## 2021-11-03 MED ORDER — LORAZEPAM 0.5 MG PO TABS: 0.5000 mg | ORAL_TABLET | Freq: Two times a day (BID) | ORAL | 2 refills | Status: DC | PRN

## 2021-11-03 NOTE — Progress Notes (Signed)
Error. No visit today. Seen 4/11 and discussed results.  ?

## 2021-11-03 NOTE — Telephone Encounter (Signed)
Twice daily as needed.

## 2021-11-03 NOTE — Telephone Encounter (Signed)
Shane [name] with [facility] called re: RX for Lorazepam 0.5 mg. Directions say one tablet by mouth  prn for anxiety. She is asking how many times a day?? Their phone number is [phone]. ?

## 2021-11-03 NOTE — Telephone Encounter (Signed)
Pharmacy canceled previous script, needs a new one to reflect the correct information. Pended to [facility].  ?

## 2021-11-03 NOTE — Telephone Encounter (Signed)
Please see message. Rx/note says as needed.  ?

## 2021-11-09 ENCOUNTER — Ambulatory Visit: Payer: BC Managed Care – PPO | Admitting: Psychiatry

## 2021-11-09 ENCOUNTER — Telehealth (HOSPITAL_COMMUNITY): Payer: Self-pay | Admitting: Surgery

## 2021-11-09 ENCOUNTER — Ambulatory Visit (HOSPITAL_COMMUNITY)
Admission: RE | Admit: 2021-11-09 | Discharge: 2021-11-09 | Disposition: A | Discharge status: Home / Self Care | Payer: BC Managed Care – PPO | Source: Ambulatory Visit | Attending: Cardiology | Admitting: Cardiology

## 2021-11-09 DIAGNOSIS — Z79899 Other long term (current) drug therapy: Secondary | ICD-10-CM | POA: Insufficient documentation

## 2021-11-09 DIAGNOSIS — I5022 Chronic systolic (congestive) heart failure: Secondary | ICD-10-CM | POA: Insufficient documentation

## 2021-11-09 DIAGNOSIS — I252 Old myocardial infarction: Secondary | ICD-10-CM | POA: Insufficient documentation

## 2021-11-09 DIAGNOSIS — Z955 Presence of coronary angioplasty implant and graft: Secondary | ICD-10-CM | POA: Diagnosis not present

## 2021-11-09 DIAGNOSIS — I3139 Other pericardial effusion (noninflammatory): Secondary | ICD-10-CM | POA: Diagnosis not present

## 2021-11-09 DIAGNOSIS — I2510 Atherosclerotic heart disease of native coronary artery without angina pectoris: Secondary | ICD-10-CM | POA: Diagnosis not present

## 2021-11-09 DIAGNOSIS — G4733 Obstructive sleep apnea (adult) (pediatric): Secondary | ICD-10-CM | POA: Insufficient documentation

## 2021-11-09 DIAGNOSIS — I110 Hypertensive heart disease with heart failure: Secondary | ICD-10-CM | POA: Diagnosis not present

## 2021-11-09 DIAGNOSIS — I493 Ventricular premature depolarization: Secondary | ICD-10-CM | POA: Insufficient documentation

## 2021-11-09 DIAGNOSIS — E119 Type 2 diabetes mellitus without complications: Secondary | ICD-10-CM | POA: Diagnosis not present

## 2021-11-09 DIAGNOSIS — E039 Hypothyroidism, unspecified: Secondary | ICD-10-CM | POA: Insufficient documentation

## 2021-11-09 DIAGNOSIS — Z7984 Long term (current) use of oral hypoglycemic drugs: Secondary | ICD-10-CM | POA: Diagnosis not present

## 2021-11-09 LAB — BASIC METABOLIC PANEL
Anion gap: 7 (ref 5–15)
BUN: 22 mg/dL — ABNORMAL HIGH (ref 6–20)
CO2: 31 mmol/L (ref 22–32)
Calcium: 9.3 mg/dL (ref 8.9–10.3)
Chloride: 101 mmol/L (ref 98–111)
Creatinine, Ser: 1.24 mg/dL (ref 0.61–1.24)
GFR, Estimated: >60 mL/min (ref >60)
Glucose, Bld: 129 mg/dL — ABNORMAL HIGH (ref 70–99)
Potassium: 4.8 mmol/L (ref 3.5–5.1)
Sodium: 139 mmol/L (ref 135–145)

## 2021-11-09 LAB — BRAIN NATRIURETIC PEPTIDE: B Natriuretic Peptide: 1268.4 pg/mL — ABNORMAL HIGH (ref 0.0–100.0)

## 2021-11-09 NOTE — Telephone Encounter (Signed)
I contacted patient to review results and recommendations per provider.  He confirms that he is taking Torsemide '20mg'$  daily. ?

## 2021-11-09 NOTE — Telephone Encounter (Signed)
-----   Message from Rafael Bihari, [place] sent at 11/09/2021  1:25 PM EDT ----- ?BNP remains elevated. Please make sure he is taking torsemide 20 mg daily. ?

## 2021-11-10 ENCOUNTER — Encounter (HOSPITAL_COMMUNITY): Payer: Self-pay | Admitting: Internal Medicine

## 2021-11-10 ENCOUNTER — Ambulatory Visit (HOSPITAL_BASED_OUTPATIENT_CLINIC_OR_DEPARTMENT_OTHER)
Admission: RE | Admit: 2021-11-10 | Discharge: 2021-11-10 | Disposition: A | Discharge status: Home / Self Care | Payer: BC Managed Care – PPO | Source: Ambulatory Visit | Attending: Internal Medicine | Admitting: Internal Medicine

## 2021-11-10 ENCOUNTER — Ambulatory Visit (HOSPITAL_BASED_OUTPATIENT_CLINIC_OR_DEPARTMENT_OTHER)
Admission: RE | Admit: 2021-11-10 | Discharge: 2021-11-10 | Disposition: A | Discharge status: Home / Self Care | Payer: BC Managed Care – PPO | Source: Ambulatory Visit | Attending: Family Medicine | Admitting: Family Medicine

## 2021-11-10 VITALS — BP 106/68 | HR 56 | Wt 141.2 lb

## 2021-11-10 DIAGNOSIS — Z79899 Other long term (current) drug therapy: Secondary | ICD-10-CM | POA: Diagnosis not present

## 2021-11-10 DIAGNOSIS — I493 Ventricular premature depolarization: Secondary | ICD-10-CM

## 2021-11-10 DIAGNOSIS — E119 Type 2 diabetes mellitus without complications: Secondary | ICD-10-CM | POA: Diagnosis not present

## 2021-11-10 DIAGNOSIS — I110 Hypertensive heart disease with heart failure: Secondary | ICD-10-CM | POA: Diagnosis not present

## 2021-11-10 DIAGNOSIS — G4733 Obstructive sleep apnea (adult) (pediatric): Secondary | ICD-10-CM | POA: Diagnosis not present

## 2021-11-10 DIAGNOSIS — Z951 Presence of aortocoronary bypass graft: Secondary | ICD-10-CM

## 2021-11-10 DIAGNOSIS — I2510 Atherosclerotic heart disease of native coronary artery without angina pectoris: Secondary | ICD-10-CM | POA: Diagnosis not present

## 2021-11-10 DIAGNOSIS — Z955 Presence of coronary angioplasty implant and graft: Secondary | ICD-10-CM | POA: Diagnosis not present

## 2021-11-10 DIAGNOSIS — I5020 Unspecified systolic (congestive) heart failure: Secondary | ICD-10-CM

## 2021-11-10 DIAGNOSIS — I252 Old myocardial infarction: Secondary | ICD-10-CM | POA: Diagnosis not present

## 2021-11-10 DIAGNOSIS — I5022 Chronic systolic (congestive) heart failure: Secondary | ICD-10-CM

## 2021-11-10 DIAGNOSIS — E039 Hypothyroidism, unspecified: Secondary | ICD-10-CM | POA: Diagnosis not present

## 2021-11-10 DIAGNOSIS — Z7984 Long term (current) use of oral hypoglycemic drugs: Secondary | ICD-10-CM | POA: Diagnosis not present

## 2021-11-10 DIAGNOSIS — I3139 Other pericardial effusion (noninflammatory): Secondary | ICD-10-CM | POA: Diagnosis not present

## 2021-11-10 LAB — ECHOCARDIOGRAM COMPLETE
Area-P 1/2: 2.42 cm2
Calc EF: 41.2 %
MV M vel: 4.03 m/s
MV Peak grad: 64.8 mmHg
Radius: 0.6 cm
S' Lateral: 4.2 cm
Single Plane A2C EF: 45.9 %
Single Plane A4C EF: 38.3 %

## 2021-11-10 MED ORDER — SPIRONOLACTONE 25 MG PO TABS: 12.5000 mg | ORAL_TABLET | Freq: Every evening | ORAL | 3 refills | Status: DC

## 2021-11-10 NOTE — Progress Notes (Signed)
? ?[facility] PROGRESS NOTE ? ?Primary Care: Leamon Arnt, MD ?General Cardiologist: Dr. Gwenlyn Found ?HF Cardiologist: Dr. Haroldine Laws ? ? ?Reason for Visit: F/u chronic systolic heart failure ? ?HPI: ?Shane [name] is a 56 y.o. 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  ? ?Echo at time of CABG showed moderately reduced LVEF, 30-35%. EF improved post revascularization and had normalized, back to 50-55%, on repeat echo in 2016. In 2017, he underwent cardiac w/u for decrease exercise tolerance. Echo w/ normal LVEF, 50-55%, no significant valvular diease.  ?  ?Echo 1/23 EF back down to 30-35%, RV mildly reduced, mild-mod MR. Mod TR.  ?  ?R/LHC on 1/23 occluded native RCA and RCA vein graft with left-to-right collaterals. The remainder of his grafts were patent.  All his native arteries were occluded. RHC showed elevated right sided filling pressures and marginal CO (see hemodynamics below). He was direct admitted for IV diuretics and AHF consultation. He was also observed to have frequent PVCs ~25/min and previous records showed where prior Zio monitor had also showed high burden PVCs ~13%. He was placed on mexiletine for suppression but c/w frequent PVCs, was later switched to amio 200 mg bid. cMRI EF 29% c/w ischemic CM w/ coronary disease pattern LGE update/ scar. Also noted to have large posterior pericardial effusion w/o tamponade as well as moderate TR.  ? ?He had repeat Zio 2/23 while on amio + mexiletine. This showed reduction in PVC burden from 13 to 8.8%. He was subsequently referred to EP for PVC ablation. Seen by Dr. Curt Bears. Given concern for side effects w/ long term use of amio + pt preference, amiodarone was discontinued and mexiletine was increased to 300 mg bid. Dr. Curt Bears recommended repeat Zio to re quantify burden on monotherapy. Zio not finalized yet ? ?At EP  visit, his BP was also low and he complained of dizziness and spironolactone was discontinued. Torsemide changed to PRN. He is also being considered for ICD, pending f/u echo.  ? ?Also of note, his PCP recently check TFT and found to be hypothyroid. He has been started on levothyroxine 50 mcg daily.  ? ?He returns today for f/u with his wife. Torsemide restarted at '20mg'$  daily due to elevated BNP. Working FT with [employer], [employer]. Mild SOB with walking up steps. No CP, edema, orthopnea or PND. Walking on TM 3-4x/week. About 30 mins Denies palpitations  ? ?Dr. Claiborne Billings following for OSA. He is scheduled for split night sleep study for CPAP titration. Unable to complete due to lack of staffing. Rescheduled for 5/9 ? ?Echo today 11/10/21 EF 30-35% Personally reviewed ? ?  ?Cardiac Studies  ?- Echo (1/23): EF 30-35%, moderate LV dysfunction, global LV HK, RV mildly reduced, RVSP 30.7 mmHg, mild to mod MR, moderate TR ?  ?- R/LHC 08/03/21:  occluded RCA and RCA vein graft with left-to-right collaterals.  The remainder of his grafts are patent.  All his native arteries were occluded. ?  ?Hemodynamics: ?1: Right atrial pressure-17/17 ?2: Right ventricular pressure-51/23, mean 27 ?3: Pulmonary artery pressure-57/9, mean 35 ?4: Pulmonary wedge pressure-A-wave 28, V wave 24, mean 17 ?5: Cardiac output-3.7 L/min with an index of 2 L/min/m? by Fick ?6: LVEDP-24 ?  ?- Cardiac MRI 08/04/21: ?Ischemic scar pattern.  ?LVEF 29% ?RVEF 41% ?Mod TR ?Large posterior pericardial effusion w/o tamponade  ? ?  Zio 2/23 - PVC burden 8.8%  ?  ? ? ?Past Medical History:  ?Diagnosis Date  ? CAD (coronary artery disease), s/p stent 2011 and CABG 2015 [facility])   ? Hodgkin disease ([place]) 1997  ? Hyperlipidemia   ? Macular degeneration   ? PVC's (premature ventricular contractions)   ? ?Current Outpatient Medications  ?Medication Sig Dispense Refill  ? acetaminophen (TYLENOL) 500 MG tablet Take 500 mg by mouth every 6 (six) hours as needed (pain).    ?  ARIPiprazole (ABILIFY) 5 MG tablet Take 1 tablet (5 mg total) by mouth daily. 90 tablet 1  ? aspirin 81 MG tablet Take 81 mg by mouth daily.    ? Bevacizumab (AVASTIN IV) Inject 1 Dose into the eye every 6 (six) weeks.    ? carvedilol (COREG) 3.125 MG tablet Take 1 tablet by mouth twice daily. 180 tablet 3  ? ciprofloxacin (CILOXAN) 0.3 % ophthalmic solution Eye injection every 6-8 weeks    ? dapagliflozin propanediol (FARXIGA) 10 MG TABS tablet Take 1 tablet (10 mg total) by mouth daily before breakfast. 30 tablet 6  ? escitalopram (LEXAPRO) 20 MG tablet Take 1 tablet (20 mg total) by mouth daily. 90 tablet 1  ? levothyroxine (SYNTHROID) 50 MCG tablet Take 1 tablet (50 mcg total) by mouth daily. 90 tablet 0  ? LORazepam (ATIVAN) 0.5 MG tablet Take 1 tablet (0.5 mg total) by mouth 2 (two) times daily as needed for anxiety. 60 tablet 2  ? metFORMIN (GLUCOPHAGE-XR) 500 MG 24 hr tablet Take 2 tablets (1,000 mg total) by mouth daily with breakfast. 180 tablet 3  ? mexiletine (MEXITIL) 150 MG capsule Take 2 capsules (300 mg total) by mouth 2 (two) times daily. 120 capsule 3  ? Multiple Vitamins-Minerals (PRESERVISION AREDS 2 PO) Take 2 tablets by mouth in the morning.    ? nitroGLYCERIN (NITROSTAT) 0.4 MG SL tablet Place 1 tablet (0.4 mg total) under the tongue every 5 (five) minutes as needed for chest pain. 25 tablet 6  ? rosuvastatin (CRESTOR) 20 MG tablet Take 1 tablet by mouth daily. **Please call to schedule office visit prior to further refills** 90 tablet 3  ? sacubitril-valsartan (ENTRESTO) 24-26 MG Take 1 tablet by mouth 2 (two) times daily. 60 tablet 3  ? torsemide (DEMADEX) 20 MG tablet Take 1 tablet (20 mg total) by mouth daily. For weight gain and/or swelling 30 tablet 5  ? ursodiol (ACTIGALL) 500 MG tablet Take 500 mg by mouth 2 (two) times daily.    ? ?No current facility-administered medications for this encounter.  ? ?No Known Allergies ? ?Social History  ? ?Socioeconomic History  ? Marital status:  Married  ?  Spouse name: Not on file  ? Number of children: Not on file  ? Years of education: Not on file  ? Highest education level: Not on file  ?Occupational History  ? Not on file  ?Tobacco Use  ? Smoking status: Never  ? Smokeless tobacco: Former  ? Tobacco comments:  ?  a long time ago  ?Vaping Use  ? Vaping Use: Never used  ?Substance and Sexual Activity  ? Alcohol use: Yes  ?  Alcohol/week: 3.0 standard drinks  ?  Types: 3 Cans of beer per week  ?  Comment: sometimes 3 beers daily  ? Drug use: No  ? Sexual activity: Yes  ?Other Topics Concern  ? Not on file  ?Social History Narrative  ? Not on file  ? ?Social  Determinants of Health  ? ?Financial Resource Strain: Not on file  ?Food Insecurity: Not on file  ?Transportation Needs: Not on file  ?Physical Activity: Not on file  ?Stress: Not on file  ?Social Connections: Not on file  ?Intimate Partner Violence: Not on file  ? ?Family History  ?Problem Relation Age of Onset  ? Stroke Mother   ? Cancer Mother   ? COPD Mother   ? Heart disease Father   ? COPD Father   ? Heart attack Father   ? Heart Problems Brother   ?     heart valve replaced at 68 years old  ? Heart Problems Brother   ?     MI at age 63; CABG at age 76  ? Cancer Maternal Grandmother   ? Colon cancer Neg Hx   ? ?BP 106/68   Pulse (!) 56   Wt 64 kg (141 lb 3.2 oz)   SpO2 100%   BMI 21.79 kg/m?  ? ?Wt Readings from Last 3 Encounters:  ?11/10/21 64 kg (141 lb 3.2 oz)  ?10/27/21 66.4 kg (146 lb 6.4 oz)  ?10/26/21 65.9 kg (145 lb 3.2 oz)  ? ?PHYSICAL EXAM: ?General:  Well appearing. No resp difficulty ?HEENT: normal ?Neck: supple. no JVD. Carotids 2+ bilat; no bruits. No lymphadenopathy or thryomegaly appreciated. ?Cor: PMI nondisplaced. Regular rate & rhythm. No rubs, gallops or murmurs. ?Lungs: clear ?Abdomen: soft, nontender, nondistended. No hepatosplenomegaly. No bruits or masses. Good bowel sounds. ?Extremities: no cyanosis, clubbing, rash, edema ?Neuro: alert & orientedx3, cranial nerves  grossly intact. moves all 4 extremities w/o difficulty. Affect pleasant ? ?ECG: SR 61 1AVB 2 monomorphic PVCs Personally reviewed ? ? ?ASSESSMENT & PLAN: ? ?1. Chronic Systolic Heart Failure  ?- prior h/o ischem

## 2021-11-10 NOTE — Patient Instructions (Signed)
Medication Changes: ? ?START spironolactone 12.'5mg'$  oral, at bedtime.  ? ?Lab Work: ? ?None today. ?Get your labs drawn in 1 week, then again in 4 weeks.  ?your results will be available in MyChart, we will contact you for abnormal readings. ? ? ?Special Instructions // Education: ? ?Do the following things EVERYDAY: ?Weigh yourself in the morning before breakfast. Write it down and keep it in a log. ?Take your medicines as prescribed ?Eat low salt foods--Limit salt (sodium) to 2000 mg per day.  ?Stay as active as you can everyday ?Limit all fluids for the day to less than 2 liters ? ? ?Follow-Up in: 3 months ? ?At the [facility], you and your health needs are our priority. We have a designated team specialized in the treatment of Heart Failure. This Care Team includes your primary Heart Failure Specialized Cardiologist (physician), Advanced Practice Providers (APPs- Physician Assistants and Nurse Practitioners), and Pharmacist who all work together to provide you with the care you need, when you need it.  ? ?You may see any of the following providers on your designated Care Team at your next follow up: ? ?Dr Glori Bickers ?Dr Loralie Champagne ?Darrick Grinder, NP ?Lyda Jester, PA ?Jessica Milford,NP ?Marlyce Huge, PA ?Audry Riles, PharmD ? ? ?Please be sure to bring in all your medications bottles to every appointment.  ? ?Need to Contact us: ? ?If you have any questions or concerns before your next appointment please send [place] a message through [place] or call our office at [phone].   ? ?TO LEAVE A MESSAGE FOR THE NURSE SELECT OPTION 2, PLEASE LEAVE A MESSAGE INCLUDING: ?YOUR NAME ?DATE OF BIRTH ?CALL BACK NUMBER ?REASON FOR CALL**this is important as we prioritize the call backs ? ?YOU WILL RECEIVE A CALL BACK THE SAME DAY AS LONG AS YOU CALL BEFORE 4:00 PM ? ? ?

## 2021-11-12 ENCOUNTER — Ambulatory Visit (INDEPENDENT_AMBULATORY_CARE_PROVIDER_SITE_OTHER): Payer: BC Managed Care – PPO | Admitting: *Deleted

## 2021-11-12 DIAGNOSIS — Z23 Encounter for immunization: Secondary | ICD-10-CM

## 2021-11-12 NOTE — Progress Notes (Signed)
Per orders of Dr. Jonni Sanger, injection of 2nd Hep B Vaccine given in right deltoid per patient preference by Zacarias Pontes, CMA. Patient tolerated injection well.  ? ?. ?

## 2021-11-17 ENCOUNTER — Ambulatory Visit (HOSPITAL_BASED_OUTPATIENT_CLINIC_OR_DEPARTMENT_OTHER): Payer: BC Managed Care – PPO | Attending: Cardiology | Admitting: Cardiovascular Disease

## 2021-11-17 ENCOUNTER — Ambulatory Visit (HOSPITAL_COMMUNITY)
Admission: RE | Admit: 2021-11-17 | Discharge: 2021-11-17 | Disposition: A | Discharge status: Home / Self Care | Payer: BC Managed Care – PPO | Source: Ambulatory Visit | Attending: Cardiology | Admitting: Cardiology

## 2021-11-17 DIAGNOSIS — I5022 Chronic systolic (congestive) heart failure: Secondary | ICD-10-CM | POA: Diagnosis not present

## 2021-11-17 DIAGNOSIS — I5020 Unspecified systolic (congestive) heart failure: Secondary | ICD-10-CM

## 2021-11-17 DIAGNOSIS — I10 Essential (primary) hypertension: Secondary | ICD-10-CM

## 2021-11-17 DIAGNOSIS — Z9989 Dependence on other enabling machines and devices: Secondary | ICD-10-CM

## 2021-11-17 DIAGNOSIS — G4733 Obstructive sleep apnea (adult) (pediatric): Secondary | ICD-10-CM | POA: Diagnosis not present

## 2021-11-17 DIAGNOSIS — I110 Hypertensive heart disease with heart failure: Secondary | ICD-10-CM | POA: Insufficient documentation

## 2021-11-17 DIAGNOSIS — I2510 Atherosclerotic heart disease of native coronary artery without angina pectoris: Secondary | ICD-10-CM | POA: Diagnosis not present

## 2021-11-17 LAB — BASIC METABOLIC PANEL
Anion gap: 10 (ref 5–15)
BUN: 28 mg/dL — ABNORMAL HIGH (ref 6–20)
CO2: 24 mmol/L (ref 22–32)
Calcium: 9.1 mg/dL (ref 8.9–10.3)
Chloride: 105 mmol/L (ref 98–111)
Creatinine, Ser: 1.19 mg/dL (ref 0.61–1.24)
GFR, Estimated: >60 mL/min (ref >60)
Glucose, Bld: 101 mg/dL — ABNORMAL HIGH (ref 70–99)
Potassium: 4.2 mmol/L (ref 3.5–5.1)
Sodium: 139 mmol/L (ref 135–145)

## 2021-11-18 ENCOUNTER — Ambulatory Visit (INDEPENDENT_AMBULATORY_CARE_PROVIDER_SITE_OTHER): Payer: BC Managed Care – PPO | Admitting: Psychiatry

## 2021-11-18 DIAGNOSIS — F411 Generalized anxiety disorder: Secondary | ICD-10-CM | POA: Diagnosis not present

## 2021-11-18 NOTE — Progress Notes (Signed)
?    [facility] Counselor/Therapist Progress Note ? ?Patient ID: Shane [name], MRN: [id number],   ? ?Date: 11/18/2021 ? ?Time Spent: 50 minutes  ? ?Treatment Type: Individual Therapy ? ?Reported Symptoms: anxiety, stressed (personal and work), "not depressed" ? ?Mental Status Exam: ? ?Appearance:   Casual     ?Behavior:  Appropriate, Sharing, and Motivated  ?Motor:  Normal  ?Speech/Language:   Clear and Coherent  ?Affect:  Appropriate  ?Mood:  anxious  ?Thought process:  goal directed  ?Thought content:    WNL  ?Sensory/Perceptual disturbances:    WNL  ?Orientation:  oriented to person, place, time/date, situation, day of week, month of year, year, and stated date of Nov 18, 2021  ?Attention:  Good  ?Concentration:  Good and Fair  ?Memory:  WNL  ?Fund of knowledge:   Good  ?Insight:    Good  ?Judgment:   Good  ?Impulse Control:  Good  ? ?Risk Assessment: ?Danger to Self:  No ?Self-injurious Behavior: No ?Danger to Others: No ?Duty to Warn:no ?Physical Aggression / Violence:No  ?Access to Firearms a concern: No  ?Gang Involvement:No  ? ?Subjective:  Patient in today reporting stress and anxiety related to work and health concerns. Patient reports being very busy and ends up tired mentally and physically, and he shares that he is being careful to get enough rest and appropriate exercise per doctor. Has flexibility at work to take breaks as needed.  Walking, reframing, slow deep breathing exercises, positive self-talk all help. When stressed, I can put it down, walk away and think about things and then come back to it. Not obsessing as much about the future as he seems to be progressing physically although very gradually.  Does have some family time away coming up that his doctors have approved, which can be of help to him mentally and emotionally as well. ? ?Interventions: Solution-Oriented/Positive Psychology and Insight-Oriented ? ?Plan:   ?Patient not signing tx updates on computer screen due to [place]   ?Treatment Goals: ?Goals remain on tx plan while patient works on strategies to meet his goals. Progress is documented each session in the "Progress" section on Plan. ?Long Term Goal: ?Enhance the ability to handle effectively his depression and the full variety of life's anxieties. ? Short Term Goal:  ?Verbalize an understanding of how thoughts, physical feelings, and behavioral actions contribute to anxiety and anger and its treatment. ?Strategy:  ?Patient will work to recognize and interrupt triggers to his anxious or depressive thoughts  so as to replace with positive, more reality-based thoughts, which lead to more positive actions/behaviors. ? ? ?Diagnosis: ?  ICD-10-CM   ?1. Generalized anxiety disorder  F41.1   ?  ? ?Plan:  Patient in today showing good motivation and active participation in session.  Focused on anxiety and stress regarding work and health concerns as noted above.  Managing the health concerns better and doctors are giving him reasons to be encouraged some at this point.  Has advantage at work of knowing that he can take breaks as needed.  Doctors have also approved a trip he is planning away to see family.  Worked in session on strategies that can help him during stressful times including deep breathing exercises and focusing on how he is controlling his breathing, peaceful music, walking, reframing, looking for positives versus negatives, and using more positive/encouraging self talk.  Is getting more positive feedback from his doctors as they continue to treat/monitor him for diabetes, thyroid issues, heart failure  issues, and elevated liver enzymes.  Marital issues are at a minimum now which is strong improvement.  Patient definitely less fearful and becoming more hopeful.  Continues to remain in the present and trying to not assume worst case scenarios especially with recent encouragement on his health situation, and following up on any advice recommended by his doctors.  Due to the  above mentioned family time away coming up that has been approved by his doctors, will see patient again in approximately 6 weeks and patient knows he can call if needed before that time. Encouraged patient to be practicing more positive behaviors discussed in session including: Staying in the present and focusing on what he can control versus cannot, using deep breathing exercises to help with anxiety, practice more active listening skills with wife which can help with potential conflict, consistently practice positive self talk, look intentionally for more positives versus negatives daily, remain on his medications as prescribed, exercise in accordance with his doctor's recommendations, letting go of things from the past that can hold him back now, and realize the strength he shows when working with goal-directed behaviors to move forward in a direction that supports his improved emotional health. ? ?Goal review and progress/challenges noted with patient. ? ?Next appointment within 3 weeks. ? ?This record has been created using [employer].  Chart creation errors have been sought, but may not always have been located and corrected.  Such creation errors do not reflect on the standard of medical care provided. ? ? ?[name], LCSW ? ? ? ? ? ? ? ? ? ? ? ? ? ? ? ? ? ? ?

## 2021-11-23 ENCOUNTER — Encounter (HOSPITAL_BASED_OUTPATIENT_CLINIC_OR_DEPARTMENT_OTHER): Payer: Self-pay | Admitting: Cardiovascular Disease

## 2021-11-23 NOTE — Procedures (Signed)
? ? ? ?Patient Name: Shane [name], Shane [name] ?Study Date: 11/17/2021 ?Gender: Male ?D.O.B: Sep 15, 1965 ?Age (years): 6 ?Referring Provider: Shelva Majestic MD, ABSM ?Height (inches): 68 ?Interpreting Physician: Shelva Majestic MD, ABSM ?Weight (lbs): 142 ?RPSGT: Carolin Coy ?BMI: 22 ?MRN: [id number] ?Neck Size: 13.00 ? ?CLINICAL INFORMATION ?The patient is referred for a split night study with BPAP. ? ?Most recent [place] polysomnogram dated 09/04/2021 revealed an AHI of 3.3/h; O2 nadir 78%. ? ?MEDICATIONS ?acetaminophen (TYLENOL) 500 MG tablet ?ARIPiprazole (ABILIFY) 5 MG tablet ?aspirin 81 MG tablet ?Bevacizumab (AVASTIN IV) ?carvedilol (COREG) 3.125 MG tablet ?ciprofloxacin (CILOXAN) 0.3 % ophthalmic solution ?dapagliflozin propanediol (FARXIGA) 10 MG TABS tablet ?escitalopram (LEXAPRO) 20 MG tablet ?levothyroxine (SYNTHROID) 50 MCG tablet ?LORazepam (ATIVAN) 0.5 MG tablet ?metFORMIN (GLUCOPHAGE-XR) 500 MG 24 hr tablet ?mexiletine (MEXITIL) 150 MG capsule ?Multiple Vitamins-Minerals (PRESERVISION AREDS 2 PO) ?nitroGLYCERIN (NITROSTAT) 0.4 MG SL tablet ?rosuvastatin (CRESTOR) 20 MG tablet ?sacubitril-valsartan (ENTRESTO) 24-26 MG ?spironolactone (ALDACTONE) 25 MG tablet ?torsemide (DEMADEX) 20 MG tablet ?ursodiol (ACTIGALL) 500 MG tablet ?Medications self-administered by patient taken the night of the study : N/A ? ?SLEEP STUDY TECHNIQUE ?As per the AASM Manual for the Scoring of Sleep and Associated Events v2.3 (April 2016) with a hypopnea requiring 4% desaturations. ? ?The channels recorded and monitored were frontal, central and occipital EEG, electrooculogram (EOG), submentalis EMG (chin), nasal and oral airflow, thoracic and abdominal wall motion, anterior tibialis EMG, snore microphone, electrocardiogram, and pulse oximetry. Bi-level positive airway pressure (BiPAP) was initiated when the patient met split night criteria and was titrated according to treat sleep-disordered breathing. ? ?RESPIRATORY  PARAMETERS ?Diagnostic ?Total AHI (/hr): 18.1 RDI (/hr): 54.4 OA Index (/hr): 4.2 CA Index (/hr): 0.0 ?REM AHI (/hr): N/A NREM AHI (/hr): 18.1 Supine AHI (/hr): 18.1 Non-supine AHI (/hr): N/A ?Min O2 Sat (%): 87.0 Mean O2 (%): 93.7 Time below 88% (min): 0.5  ? ?Titration ?Optimal IPAP Pressure (cm): 21 Optimal EPAP Pressure (cm): 17 AHI at Optimal Pressure (/hr): 0 Min O2 at Optimal Pressure (%): 94.0 ?Sleep % at Optimal (%): 94 Supine % at Optimal (%): 100  ? ?SLEEP ARCHITECTURE ?The study was initiated at 9:59:39 PM and terminated at 4:46:16 AM. The total recorded time was 406.6 minutes. EEG confirmed total sleep time was 314.5 minutes yielding a sleep efficiency of 77.3%%. Sleep onset after lights out was 13.7 minutes with a REM latency of 205.5 minutes. The patient spent 31.0%% of the night in stage N1 sleep, 56.1%% in stage N2 sleep, 0.0%% in stage N3 and 12.9% in REM. Wake after sleep onset (WASO) was 78.4 minutes. The Arousal Index was 43.9/hour. ? ?LEG MOVEMENT DATA ?The total Periodic Limb Movements of Sleep (PLMS) were 0. The PLMS index was 0.0 . ? ?CARDIAC DATA ?The 2 lead EKG demonstrated sinus rhythm. The mean heart rate was 100.0 beats per minute. Other EKG findings include: frequent PVCs with occasional bigeminal rhythm. ? ?IMPRESSIONS ?- Moderate obstructive sleep apnea overall (AHI18.1 /h; RDI 54.4/h); however, sleep REM sleep was absent of the diagnostic evaluation. CPAP was initiated at 5 cm,  titrated to 17 cm (AHI 12.4/h) and transitioned to BiPAP at 21/17 (AHI ); O2 nadir 94%.   ?- No significant central sleep apnea occurred during the diagnostic portion of the study (CAI 0.0/hour). ?- The patient had minimal oxygen desaturation during the diagnostic portion of the study (Min O2 87.0%) ?- The patient snored with moderate snoring volume during the diagnostic portion of the study. ?- EKG findings include frequent PVCs with occasional ventricular  bigeminal rhythm. ?- Clinically significant  periodic limb movements of sleep did not occur during the study. ? ?DIAGNOSIS ?- Obstructive Sleep Apnea (G47.33) ? ?RECOMMENDATIONS ?- Recommend an initial trial BiPAP Auto therapy with EPAP min of 16, PS of 4 and IPAP max of 25 with heated humidification. A  Medium size Resmed Full Face AirFit F30i mask was used for the titration. ?- Effort shouild be made to optimize nasal and oropharyngeal patency. ?- Avoid alcohol, sedatives and other CNS depressants that may worsen sleep apnea and disrupt normal sleep architecture. ?- Sleep hygiene should be reviewed to assess factors that may improve sleep quality. ?- Weight management and regular exercise should be initiated or continued. ?- Recommend a download and sleep clinic evaluation after one month of therapy.  ? ?[Electronically signed] 11/23/2021 09:42 AM ? ?Shelva Majestic MD, [facility], ABSM ?Diplomate, Tax adviser of Sleep Medicine ? ?NPI: [id number] ? ?[place] ?PH: [phone]   FX: [phone] ?ACCREDITED BY THE AMERICAN ACADEMY OF SLEEP MEDICINE ? ?

## 2021-11-30 ENCOUNTER — Other Ambulatory Visit: Payer: Self-pay | Admitting: Cardiovascular Disease

## 2021-11-30 DIAGNOSIS — E785 Hyperlipidemia, unspecified: Secondary | ICD-10-CM

## 2021-12-01 ENCOUNTER — Encounter (INDEPENDENT_AMBULATORY_CARE_PROVIDER_SITE_OTHER): Payer: BC Managed Care – PPO | Admitting: Ophthalmology

## 2021-12-01 ENCOUNTER — Ambulatory Visit: Payer: BC Managed Care – PPO | Admitting: Psychiatry

## 2021-12-01 ENCOUNTER — Telehealth: Payer: Self-pay | Admitting: *Deleted

## 2021-12-01 DIAGNOSIS — I10 Essential (primary) hypertension: Secondary | ICD-10-CM

## 2021-12-01 DIAGNOSIS — H32 Chorioretinal disorders in diseases classified elsewhere: Secondary | ICD-10-CM | POA: Diagnosis not present

## 2021-12-01 DIAGNOSIS — B399 Histoplasmosis, unspecified: Secondary | ICD-10-CM

## 2021-12-01 DIAGNOSIS — H35033 Hypertensive retinopathy, bilateral: Secondary | ICD-10-CM

## 2021-12-01 DIAGNOSIS — H2513 Age-related nuclear cataract, bilateral: Secondary | ICD-10-CM

## 2021-12-01 DIAGNOSIS — H353221 Exudative age-related macular degeneration, left eye, with active choroidal neovascularization: Secondary | ICD-10-CM

## 2021-12-01 DIAGNOSIS — H43813 Vitreous degeneration, bilateral: Secondary | ICD-10-CM

## 2021-12-01 DIAGNOSIS — H348321 Tributary (branch) retinal vein occlusion, left eye, with retinal neovascularization: Secondary | ICD-10-CM | POA: Diagnosis not present

## 2021-12-01 NOTE — Telephone Encounter (Signed)
Patient returned a call to me and was given his sleep study results. He agrees to proceed with MD recommendations.

## 2021-12-01 NOTE — Telephone Encounter (Signed)
Left message to return a call to discuss sleep study. 

## 2021-12-01 NOTE — Telephone Encounter (Signed)
-----   Message from Troy Sine, MD sent at 11/23/2021  9:53 AM EDT ----- Shane [name], please notify patient and set up with BiPAP with DME

## 2021-12-02 ENCOUNTER — Telehealth: Payer: Self-pay | Admitting: *Deleted

## 2021-12-02 DIAGNOSIS — F3110 Bipolar disorder, current episode manic without psychotic features, unspecified: Secondary | ICD-10-CM | POA: Diagnosis not present

## 2021-12-02 DIAGNOSIS — F411 Generalized anxiety disorder: Secondary | ICD-10-CM | POA: Diagnosis not present

## 2021-12-02 DIAGNOSIS — F902 Attention-deficit hyperactivity disorder, combined type: Secondary | ICD-10-CM | POA: Diagnosis not present

## 2021-12-02 DIAGNOSIS — R251 Tremor, unspecified: Secondary | ICD-10-CM | POA: Diagnosis not present

## 2021-12-02 NOTE — Telephone Encounter (Signed)
BIPAP order faxed to [facility] for set up.

## 2021-12-03 ENCOUNTER — Ambulatory Visit: Payer: BC Managed Care – PPO | Admitting: Psychiatry

## 2021-12-08 ENCOUNTER — Ambulatory Visit (HOSPITAL_COMMUNITY)
Admission: RE | Admit: 2021-12-08 | Discharge: 2021-12-08 | Disposition: A | Discharge status: Home / Self Care | Payer: BC Managed Care – PPO | Source: Ambulatory Visit | Attending: Cardiology | Admitting: Cardiology

## 2021-12-08 DIAGNOSIS — I5020 Unspecified systolic (congestive) heart failure: Secondary | ICD-10-CM | POA: Diagnosis not present

## 2021-12-08 LAB — BASIC METABOLIC PANEL
Anion gap: 10 (ref 5–15)
BUN: 21 mg/dL — ABNORMAL HIGH (ref 6–20)
CO2: 30 mmol/L (ref 22–32)
Calcium: 9.4 mg/dL (ref 8.9–10.3)
Chloride: 99 mmol/L (ref 98–111)
Creatinine, Ser: 1.31 mg/dL — ABNORMAL HIGH (ref 0.61–1.24)
GFR, Estimated: >60 mL/min (ref >60)
Glucose, Bld: 129 mg/dL — ABNORMAL HIGH (ref 70–99)
Potassium: 4.5 mmol/L (ref 3.5–5.1)
Sodium: 139 mmol/L (ref 135–145)

## 2021-12-10 DIAGNOSIS — I493 Ventricular premature depolarization: Secondary | ICD-10-CM | POA: Diagnosis not present

## 2021-12-11 ENCOUNTER — Encounter (HOSPITAL_COMMUNITY): Payer: Self-pay | Admitting: Internal Medicine

## 2021-12-14 ENCOUNTER — Other Ambulatory Visit (HOSPITAL_COMMUNITY): Payer: Self-pay | Admitting: *Deleted

## 2021-12-14 DIAGNOSIS — G4733 Obstructive sleep apnea (adult) (pediatric): Secondary | ICD-10-CM | POA: Diagnosis not present

## 2021-12-14 MED ORDER — ENTRESTO 24-26 MG PO TABS: 1.0000 | ORAL_TABLET | Freq: Two times a day (BID) | ORAL | 3 refills | Status: DC

## 2021-12-16 DIAGNOSIS — D225 Melanocytic nevi of trunk: Secondary | ICD-10-CM | POA: Diagnosis not present

## 2021-12-16 DIAGNOSIS — L814 Other melanin hyperpigmentation: Secondary | ICD-10-CM | POA: Diagnosis not present

## 2021-12-21 ENCOUNTER — Other Ambulatory Visit (HOSPITAL_COMMUNITY): Payer: Self-pay

## 2021-12-21 ENCOUNTER — Encounter: Payer: Self-pay | Admitting: *Deleted

## 2021-12-21 ENCOUNTER — Encounter: Payer: Self-pay | Admitting: Cardiology

## 2021-12-21 ENCOUNTER — Ambulatory Visit (INDEPENDENT_AMBULATORY_CARE_PROVIDER_SITE_OTHER): Payer: BC Managed Care – PPO | Admitting: Cardiology

## 2021-12-21 VITALS — BP 86/50 | HR 64 | Ht 67.5 in | Wt 140.4 lb

## 2021-12-21 DIAGNOSIS — I493 Ventricular premature depolarization: Secondary | ICD-10-CM

## 2021-12-21 DIAGNOSIS — I5020 Unspecified systolic (congestive) heart failure: Secondary | ICD-10-CM | POA: Diagnosis not present

## 2021-12-21 DIAGNOSIS — Z01812 Encounter for preprocedural laboratory examination: Secondary | ICD-10-CM

## 2021-12-21 LAB — CBC
Hematocrit: 41 % (ref 37.5–51.0)
Hemoglobin: 13.9 g/dL (ref 13.0–17.7)
MCH: 32.7 pg (ref 26.6–33.0)
MCHC: 33.9 g/dL (ref 31.5–35.7)
MCV: 97 fL (ref 79–97)
Platelets: 309 10*3/uL (ref 150–450)
RBC: 4.25 x10E6/uL (ref 4.14–5.80)
RDW: 14.1 % (ref 11.6–15.4)
WBC: 8.1 10*3/uL (ref 3.4–10.8)

## 2021-12-21 MED ORDER — SPIRONOLACTONE 25 MG PO TABS: 12.5000 mg | ORAL_TABLET | Freq: Every evening | ORAL | 11 refills | Status: DC

## 2021-12-21 MED ORDER — MEXILETINE HCL 150 MG PO CAPS: 300.0000 mg | ORAL_CAPSULE | Freq: Two times a day (BID) | ORAL | 4 refills | Status: DC

## 2021-12-21 NOTE — Progress Notes (Signed)
Electrophysiology Office Note   Date:  12/21/2021   ID:  Shane [name], DOB 11/28/1965, MRN [id number]  PCP:  Leamon Arnt, MD  Cardiologist:  Gwenlyn Found Primary Electrophysiologist:  Jasnoor Trussell Meredith Leeds, MD    Chief Complaint: PVC   History of Present Illness: Shane [name] is a 56 y.o. male who is being seen today for the evaluation of PVC at the request of Leamon Arnt, MD. Presenting today for electrophysiology evaluation.  He has a history significant for coronary artery disease, Hodgkin's disease, hyperlipidemia, macular degeneration, PVCs, systolic heart failure.  He had an anterior wall MI and had LAD stent placed in 2011.  Recurrent symptoms with and is now status post CABG with graft LIMA to the LAD, vein to the OM and RCA.  Ejection fraction was 30 to 35% but repeat echo showed an ejection fraction improved to 50 to 55%.  He is undergoing therapy for biliary cirrhosis at [facility].  He had a new decline in his LV function.  He had an occluded RCA at catheterization and occluded vein graft.  He had a left to right collaterals.  He was noted to have an elevated burden of PVCs.  Initially on amiodarone and mexiletine, amiodarone was stopped.  PVC burden is back up to 20%.  Feels weak, fatigued, short of breath.   Past Medical History:  Diagnosis Date   CAD (coronary artery disease), s/p stent 2011 and CABG 2015 [facility])    Hodgkin disease [facility]) 1997   Hyperlipidemia    Macular degeneration    PVC's (premature ventricular contractions)    Past Surgical History:  Procedure Laterality Date   CARDIAC CATHETERIZATION  08/2009   PCI -LAD 2.3mx18mm Xience   CORONARY ARTERY BYPASS GRAFT  02/2014   RIGHT/LEFT HEART CATH AND CORONARY ANGIOGRAPHY N/A 08/03/2021   Procedure: RIGHT/LEFT HEART CATH AND CORONARY ANGIOGRAPHY;  Surgeon: BLorretta Harp MD;  [facility];  Service: Cardiovascular;  Laterality: N/A;     Current Outpatient  Medications  Medication Sig Dispense Refill   acetaminophen (TYLENOL) 500 MG tablet Take 500 mg by mouth every 6 (six) hours as needed (pain).     ARIPiprazole (ABILIFY) 5 MG tablet Take 1 tablet (5 mg total) by mouth daily. 90 tablet 1   aspirin 81 MG tablet Take 81 mg by mouth daily.     Bevacizumab (AVASTIN IV) Inject 1 Dose into the eye every 6 (six) weeks.     carvedilol (COREG) 3.125 MG tablet Take 1 tablet by mouth twice daily. 180 tablet 3   ciprofloxacin (CILOXAN) 0.3 % ophthalmic solution Eye injection every 6-8 weeks     dapagliflozin propanediol (FARXIGA) 10 MG TABS tablet Take 1 tablet (10 mg total) by mouth daily before breakfast. 30 tablet 6   escitalopram (LEXAPRO) 20 MG tablet Take 1 tablet (20 mg total) by mouth daily. 90 tablet 1   levothyroxine (SYNTHROID) 50 MCG tablet Take 1 tablet (50 mcg total) by mouth daily. 90 tablet 0   LORazepam (ATIVAN) 0.5 MG tablet Take 1 tablet (0.5 mg total) by mouth 2 (two) times daily as needed for anxiety. 60 tablet 2   metFORMIN (GLUCOPHAGE-XR) 500 MG 24 hr tablet Take 2 tablets (1,000 mg total) by mouth daily with breakfast. 180 tablet 3   mexiletine (MEXITIL) 150 MG capsule Take 2 capsules (300 mg total) by mouth 2 (two) times daily. 120 capsule 3   Multiple Vitamins-Minerals (PRESERVISION AREDS 2 PO) Take  2 tablets by mouth in the morning.     nitroGLYCERIN (NITROSTAT) 0.4 MG SL tablet Place 1 tablet (0.4 mg total) under the tongue every 5 (five) minutes as needed for chest pain. 25 tablet 6   rosuvastatin (CRESTOR) 20 MG tablet Take 1 tablet by mouth daily. **Please call to schedule office visit prior to further refills** 90 tablet 0   sacubitril-valsartan (ENTRESTO) 24-26 MG Take 1 tablet by mouth 2 (two) times daily. 60 tablet 3   spironolactone (ALDACTONE) 25 MG tablet Take 0.5 tablets (12.5 mg total) by mouth at bedtime. 90 tablet 3   torsemide (DEMADEX) 20 MG tablet Take 1 tablet (20 mg total) by mouth daily. For weight gain and/or  swelling 30 tablet 5   ursodiol (ACTIGALL) 500 MG tablet Take 500 mg by mouth 2 (two) times daily.     No current facility-administered medications for this visit.    Allergies:   Patient has no known allergies.   Social History:  The patient  reports that he has never smoked. He has quit using smokeless tobacco. He reports current alcohol use of about 3.0 standard drinks of alcohol per week. He reports that he does not use drugs.   Family History:  The patient's family history includes COPD in his father and mother; Cancer in his maternal grandmother and mother; Heart Problems in his brother and brother; Heart attack in his father; Heart disease in his father; Stroke in his mother.    ROS:  Please see the history of present illness.   Otherwise, review of systems is positive for none.   All other systems are reviewed and negative.    PHYSICAL EXAM: VS:  There were no vitals taken for this visit. , BMI There is no height or weight on file to calculate BMI. GEN: Well nourished, well developed, in no acute distress  HEENT: normal  Neck: no JVD, carotid bruits, or masses Cardiac: RRR; no murmurs, rubs, or gallops,no edema  Respiratory:  clear to auscultation bilaterally, normal work of breathing GI: soft, nontender, nondistended, + BS MS: no deformity or atrophy  Skin: warm and dry Neuro:  Strength and sensation are intact Psych: euthymic mood, full affect  EKG:  EKG is ordered today. Personal review of the ekg ordered shows sinus rhythm, PVCs  Recent Labs: 10/08/2021: ALT 26; Hemoglobin 13.0; Platelets 249.0; TSH 62.93 10/27/2021: Magnesium 2.6 11/09/2021: B Natriuretic Peptide 1,268.4 12/08/2021: BUN 21; Creatinine, Ser 1.31; Potassium 4.5; Sodium 139    Lipid Panel     Component Value Date/Time   CHOL 146 10/08/2021 1043   CHOL 109 10/19/2019 1146   TRIG 67.0 10/08/2021 1043   HDL 67.70 10/08/2021 1043   HDL 44 10/19/2019 1146   CHOLHDL 2 10/08/2021 1043   VLDL 13.4  10/08/2021 1043   LDLCALC 65 10/08/2021 1043   LDLCALC 50 10/19/2019 1146     Wt Readings from Last 3 Encounters:  11/17/21 142 lb (64.4 kg)  11/10/21 141 lb 3.2 oz (64 kg)  10/27/21 146 lb 6.4 oz (66.4 kg)      Other studies Reviewed: Additional studies/ records that were reviewed today include: TTE 11/10/21 Review of the above records today demonstrates:   1. Left ventricular ejection fraction, by estimation, is 30 to 35%. The  left ventricle has moderately decreased function. The left ventricle  demonstrates regional wall motion abnormalities (see scoring  diagram/findings for description). Left ventricular   diastolic parameters are consistent with Grade II diastolic dysfunction  (pseudonormalization). Elevated  left atrial pressure. There is akinesis of  the left ventricular, apical anteroseptal wall, anterior wall and apical  segment. There is severe  hypokinesis of the left ventricular, basal-mid inferior wall and  inferolateral wall.   2. Right ventricular systolic function is mildly reduced. The right  ventricular size is mildly enlarged. There is normal pulmonary artery  systolic pressure.   3. Left atrial size was mildly dilated.   4. Right atrial size was mildly dilated.   5. The mitral valve is normal in structure. Mild to moderate mitral valve  regurgitation.   6. The aortic valve is tricuspid. Aortic valve regurgitation is not  visualized. No aortic stenosis is present.   7. The inferior vena cava is dilated in size with >50% respiratory  variability, suggesting right atrial pressure of 8 mmHg.   Cardiac monitor 12/18/2021 personally reviewed Sinus rhythm, 20% PVCs  ASSESSMENT AND PLAN:  1.  PVCs: Initially 13% burden.  Has now come down to 8%.  Currently on mexiletine 300 mg twice daily.  Unfortunately his PVC burden is back up to 20%.  He is in ventricular bigeminy today and feeling poorly.  He would like to avoid amiodarone.  Due to that, we Jhamir Pickup plan for  ablation.  Risk and benefits of been discussed with bleeding, tamponade, heart block, stroke, among others.  He understands these risks and is agreed to the procedure.  2.  Coronary disease: Status post CABG.  Occluded RCA.  Left to right collaterals.  No current chest pain.  3.  Chronic systolic heart failure: Ejection fraction severely reduced.  Currently on Farxiga 10 mg, carvedilol 3.125 mg twice daily, Entresto 24/26 mg twice daily, Aldactone 25 mg daily.  Pending his procedure, Jentri Aye need a repeat echo if PVC burden is reduced.   Current medicines are reviewed at length with the patient today.   The patient does not have concerns regarding his medicines.  The following changes were made today: None  Labs/ tests ordered today include:  No orders of the defined types were placed in this encounter.    Disposition:   FU with Takasha Vetere 3 months  Signed, Cashlyn Huguley Meredith Leeds, MD  12/21/2021 11:27 AM     [facility] [address] [place]  [address] [phone] (office) [phone] (fax)

## 2021-12-21 NOTE — H&P (View-Only) (Signed)
Electrophysiology Office Note   Date:  12/21/2021   ID:  Shane [name], DOB 02-Jan-1966, MRN [id number]  PCP:  Leamon Arnt, MD  Cardiologist:  Gwenlyn Found Primary Electrophysiologist:  Maddelynn Moosman Meredith Leeds, MD    Chief Complaint: PVC   History of Present Illness: Shane [name] is a 55 y.o. male who is being seen today for the evaluation of PVC at the request of Leamon Arnt, MD. Presenting today for electrophysiology evaluation.  He has a history significant for coronary artery disease, Hodgkin's disease, hyperlipidemia, macular degeneration, PVCs, systolic heart failure.  He had an anterior wall MI and had LAD stent placed in 2011.  Recurrent symptoms with and is now status post CABG with graft LIMA to the LAD, vein to the OM and RCA.  Ejection fraction was 30 to 35% but repeat echo showed an ejection fraction improved to 50 to 55%.  He is undergoing therapy for biliary cirrhosis at [facility].  He had a new decline in his LV function.  He had an occluded RCA at catheterization and occluded vein graft.  He had a left to right collaterals.  He was noted to have an elevated burden of PVCs.  Initially on amiodarone and mexiletine, amiodarone was stopped.  PVC burden is back up to 20%.  Feels weak, fatigued, short of breath.   Past Medical History:  Diagnosis Date   CAD (coronary artery disease), s/p stent 2011 and CABG 2015 [facility])    Hodgkin disease [facility]) 1997   Hyperlipidemia    Macular degeneration    PVC's (premature ventricular contractions)    Past Surgical History:  Procedure Laterality Date   CARDIAC CATHETERIZATION  08/2009   PCI -LAD 2.10mx18mm Xience   CORONARY ARTERY BYPASS GRAFT  02/2014   RIGHT/LEFT HEART CATH AND CORONARY ANGIOGRAPHY N/A 08/03/2021   Procedure: RIGHT/LEFT HEART CATH AND CORONARY ANGIOGRAPHY;  Surgeon: BLorretta Harp MD;  [facility];  Service: Cardiovascular;  Laterality: N/A;     Current Outpatient  Medications  Medication Sig Dispense Refill   acetaminophen (TYLENOL) 500 MG tablet Take 500 mg by mouth every 6 (six) hours as needed (pain).     ARIPiprazole (ABILIFY) 5 MG tablet Take 1 tablet (5 mg total) by mouth daily. 90 tablet 1   aspirin 81 MG tablet Take 81 mg by mouth daily.     Bevacizumab (AVASTIN IV) Inject 1 Dose into the eye every 6 (six) weeks.     carvedilol (COREG) 3.125 MG tablet Take 1 tablet by mouth twice daily. 180 tablet 3   ciprofloxacin (CILOXAN) 0.3 % ophthalmic solution Eye injection every 6-8 weeks     dapagliflozin propanediol (FARXIGA) 10 MG TABS tablet Take 1 tablet (10 mg total) by mouth daily before breakfast. 30 tablet 6   escitalopram (LEXAPRO) 20 MG tablet Take 1 tablet (20 mg total) by mouth daily. 90 tablet 1   levothyroxine (SYNTHROID) 50 MCG tablet Take 1 tablet (50 mcg total) by mouth daily. 90 tablet 0   LORazepam (ATIVAN) 0.5 MG tablet Take 1 tablet (0.5 mg total) by mouth 2 (two) times daily as needed for anxiety. 60 tablet 2   metFORMIN (GLUCOPHAGE-XR) 500 MG 24 hr tablet Take 2 tablets (1,000 mg total) by mouth daily with breakfast. 180 tablet 3   mexiletine (MEXITIL) 150 MG capsule Take 2 capsules (300 mg total) by mouth 2 (two) times daily. 120 capsule 3   Multiple Vitamins-Minerals (PRESERVISION AREDS 2 PO) Take  2 tablets by mouth in the morning.     nitroGLYCERIN (NITROSTAT) 0.4 MG SL tablet Place 1 tablet (0.4 mg total) under the tongue every 5 (five) minutes as needed for chest pain. 25 tablet 6   rosuvastatin (CRESTOR) 20 MG tablet Take 1 tablet by mouth daily. **Please call to schedule office visit prior to further refills** 90 tablet 0   sacubitril-valsartan (ENTRESTO) 24-26 MG Take 1 tablet by mouth 2 (two) times daily. 60 tablet 3   spironolactone (ALDACTONE) 25 MG tablet Take 0.5 tablets (12.5 mg total) by mouth at bedtime. 90 tablet 3   torsemide (DEMADEX) 20 MG tablet Take 1 tablet (20 mg total) by mouth daily. For weight gain and/or  swelling 30 tablet 5   ursodiol (ACTIGALL) 500 MG tablet Take 500 mg by mouth 2 (two) times daily.     No current facility-administered medications for this visit.    Allergies:   Patient has no known allergies.   Social History:  The patient  reports that he has never smoked. He has quit using smokeless tobacco. He reports current alcohol use of about 3.0 standard drinks of alcohol per week. He reports that he does not use drugs.   Family History:  The patient's family history includes COPD in his father and mother; Cancer in his maternal grandmother and mother; Heart Problems in his brother and brother; Heart attack in his father; Heart disease in his father; Stroke in his mother.    ROS:  Please see the history of present illness.   Otherwise, review of systems is positive for none.   All other systems are reviewed and negative.    PHYSICAL EXAM: VS:  There were no vitals taken for this visit. , BMI There is no height or weight on file to calculate BMI. GEN: Well nourished, well developed, in no acute distress  HEENT: normal  Neck: no JVD, carotid bruits, or masses Cardiac: RRR; no murmurs, rubs, or gallops,no edema  Respiratory:  clear to auscultation bilaterally, normal work of breathing GI: soft, nontender, nondistended, + BS MS: no deformity or atrophy  Skin: warm and dry Neuro:  Strength and sensation are intact Psych: euthymic mood, full affect  EKG:  EKG is ordered today. Personal review of the ekg ordered shows sinus rhythm, PVCs  Recent Labs: 10/08/2021: ALT 26; Hemoglobin 13.0; Platelets 249.0; TSH 62.93 10/27/2021: Magnesium 2.6 11/09/2021: B Natriuretic Peptide 1,268.4 12/08/2021: BUN 21; Creatinine, Ser 1.31; Potassium 4.5; Sodium 139    Lipid Panel     Component Value Date/Time   CHOL 146 10/08/2021 1043   CHOL 109 10/19/2019 1146   TRIG 67.0 10/08/2021 1043   HDL 67.70 10/08/2021 1043   HDL 44 10/19/2019 1146   CHOLHDL 2 10/08/2021 1043   VLDL 13.4  10/08/2021 1043   LDLCALC 65 10/08/2021 1043   LDLCALC 50 10/19/2019 1146     Wt Readings from Last 3 Encounters:  11/17/21 142 lb (64.4 kg)  11/10/21 141 lb 3.2 oz (64 kg)  10/27/21 146 lb 6.4 oz (66.4 kg)      Other studies Reviewed: Additional studies/ records that were reviewed today include: TTE 11/10/21 Review of the above records today demonstrates:   1. Left ventricular ejection fraction, by estimation, is 30 to 35%. The  left ventricle has moderately decreased function. The left ventricle  demonstrates regional wall motion abnormalities (see scoring  diagram/findings for description). Left ventricular   diastolic parameters are consistent with Grade II diastolic dysfunction  (pseudonormalization). Elevated  left atrial pressure. There is akinesis of  the left ventricular, apical anteroseptal wall, anterior wall and apical  segment. There is severe  hypokinesis of the left ventricular, basal-mid inferior wall and  inferolateral wall.   2. Right ventricular systolic function is mildly reduced. The right  ventricular size is mildly enlarged. There is normal pulmonary artery  systolic pressure.   3. Left atrial size was mildly dilated.   4. Right atrial size was mildly dilated.   5. The mitral valve is normal in structure. Mild to moderate mitral valve  regurgitation.   6. The aortic valve is tricuspid. Aortic valve regurgitation is not  visualized. No aortic stenosis is present.   7. The inferior vena cava is dilated in size with >50% respiratory  variability, suggesting right atrial pressure of 8 mmHg.   Cardiac monitor 12/18/2021 personally reviewed Sinus rhythm, 20% PVCs  ASSESSMENT AND PLAN:  1.  PVCs: Initially 13% burden.  Has now come down to 8%.  Currently on mexiletine 300 mg twice daily.  Unfortunately his PVC burden is back up to 20%.  He is in ventricular bigeminy today and feeling poorly.  He would like to avoid amiodarone.  Due to that, we Larcenia Holaday plan for  ablation.  Risk and benefits of been discussed with bleeding, tamponade, heart block, stroke, among others.  He understands these risks and is agreed to the procedure.  2.  Coronary disease: Status post CABG.  Occluded RCA.  Left to right collaterals.  No current chest pain.  3.  Chronic systolic heart failure: Ejection fraction severely reduced.  Currently on Farxiga 10 mg, carvedilol 3.125 mg twice daily, Entresto 24/26 mg twice daily, Aldactone 25 mg daily.  Pending his procedure, Patriciann Becht need a repeat echo if PVC burden is reduced.   Current medicines are reviewed at length with the patient today.   The patient does not have concerns regarding his medicines.  The following changes were made today: None  Labs/ tests ordered today include:  No orders of the defined types were placed in this encounter.    Disposition:   FU with Selisa Tensley 3 months  Signed, Sayana Salley Meredith Leeds, MD  12/21/2021 11:27 AM     [facility] [address] [place] [address] [phone] (office) [phone] (fax)

## 2021-12-21 NOTE — Patient Instructions (Signed)
Medication Instructions:  Your physician recommends that you continue on your current medications as directed. Please refer to the Current Medication list given to you today.  *If you need a refill on your cardiac medications before your next appointment, please call your pharmacy*   Lab Work: Today: CBC  If you have labs (blood work) drawn today and your tests are completely normal, you will receive your results only by: [place] (if you have MyChart) OR A paper copy in the mail If you have any lab test that is abnormal or we need to change your treatment, we will call you to review the results.   Testing/Procedures: Your physician has recommended that you have an ablation. Catheter ablation is a medical procedure used to treat some cardiac arrhythmias (irregular heartbeats). During catheter ablation, a long, thin, flexible tube is put into a blood vessel in your groin (upper thigh), or neck. This tube is called an ablation catheter. It is then guided to your heart through the blood vessel. Radio frequency waves destroy small areas of heart tissue where abnormal heartbeats may cause an arrhythmia to start. Please see the instruction sheet given to you today.   Follow-Up: At [facility], you and your health needs are our priority.  As part of our continuing mission to provide you with exceptional heart care, we have created designated Provider Care Teams.  These Care Teams include your primary Cardiologist (physician) and Advanced Practice Providers (APPs -  Physician Assistants and Nurse Practitioners) who all work together to provide you with the care you need, when you need it.  Your next appointment:   4 week(s) after your ablation  The format for your next appointment:   In Person  Provider:   Allegra Lai, MD    Thank you for choosing [facility]!!   Trinidad Curet, RN [phone]  Other Instructions   Important Information About Sugar

## 2021-12-22 NOTE — Pre-Procedure Instructions (Signed)
Instructed patient on the following items: Arrival time 0830 Nothing to eat or drink after midnight No meds AM of procedure Responsible person to drive you home and stay with you for 24 hrs    

## 2021-12-23 ENCOUNTER — Ambulatory Visit (HOSPITAL_COMMUNITY): Payer: BC Managed Care – PPO | Admitting: Anesthesiology

## 2021-12-23 ENCOUNTER — Ambulatory Visit (HOSPITAL_COMMUNITY)
Admission: RE | Admit: 2021-12-23 | Discharge: 2021-12-24 | Disposition: A | Discharge status: Home / Self Care | Payer: BC Managed Care – PPO | Attending: Cardiology | Admitting: Cardiology

## 2021-12-23 ENCOUNTER — Other Ambulatory Visit: Payer: Self-pay

## 2021-12-23 ENCOUNTER — Encounter (HOSPITAL_COMMUNITY)
Admission: RE | Disposition: A | Discharge status: Home / Self Care | Payer: Self-pay | Source: Home / Self Care | Attending: Cardiology

## 2021-12-23 DIAGNOSIS — E785 Hyperlipidemia, unspecified: Secondary | ICD-10-CM | POA: Diagnosis not present

## 2021-12-23 DIAGNOSIS — I5022 Chronic systolic (congestive) heart failure: Secondary | ICD-10-CM | POA: Diagnosis not present

## 2021-12-23 DIAGNOSIS — Z951 Presence of aortocoronary bypass graft: Secondary | ICD-10-CM | POA: Diagnosis not present

## 2021-12-23 DIAGNOSIS — E119 Type 2 diabetes mellitus without complications: Secondary | ICD-10-CM | POA: Diagnosis not present

## 2021-12-23 DIAGNOSIS — I252 Old myocardial infarction: Secondary | ICD-10-CM | POA: Insufficient documentation

## 2021-12-23 DIAGNOSIS — Z8571 Personal history of Hodgkin lymphoma: Secondary | ICD-10-CM | POA: Insufficient documentation

## 2021-12-23 DIAGNOSIS — E039 Hypothyroidism, unspecified: Secondary | ICD-10-CM | POA: Diagnosis not present

## 2021-12-23 DIAGNOSIS — Z79899 Other long term (current) drug therapy: Secondary | ICD-10-CM | POA: Insufficient documentation

## 2021-12-23 DIAGNOSIS — Z7984 Long term (current) use of oral hypoglycemic drugs: Secondary | ICD-10-CM | POA: Diagnosis not present

## 2021-12-23 DIAGNOSIS — G4730 Sleep apnea, unspecified: Secondary | ICD-10-CM | POA: Diagnosis not present

## 2021-12-23 DIAGNOSIS — Z955 Presence of coronary angioplasty implant and graft: Secondary | ICD-10-CM | POA: Insufficient documentation

## 2021-12-23 DIAGNOSIS — I493 Ventricular premature depolarization: Secondary | ICD-10-CM | POA: Diagnosis not present

## 2021-12-23 DIAGNOSIS — I110 Hypertensive heart disease with heart failure: Secondary | ICD-10-CM | POA: Insufficient documentation

## 2021-12-23 DIAGNOSIS — I2510 Atherosclerotic heart disease of native coronary artery without angina pectoris: Secondary | ICD-10-CM | POA: Diagnosis not present

## 2021-12-23 DIAGNOSIS — I10 Essential (primary) hypertension: Secondary | ICD-10-CM | POA: Diagnosis not present

## 2021-12-23 HISTORY — PX: PVC ABLATION: EP1236

## 2021-12-23 LAB — POCT ACTIVATED CLOTTING TIME
Activated Clotting Time: 233 seconds
Activated Clotting Time: 257 seconds

## 2021-12-23 LAB — GLUCOSE, CAPILLARY
Glucose-Capillary: 68 mg/dL — ABNORMAL LOW (ref 70–99)
Glucose-Capillary: 95 mg/dL (ref 70–99)
Glucose-Capillary: 148 mg/dL — ABNORMAL HIGH (ref 70–99)

## 2021-12-23 SURGERY — PVC ABLATION
Anesthesia: Monitor Anesthesia Care

## 2021-12-23 MED ORDER — PHENYLEPHRINE HCL-NACL 20-0.9 MG/250ML-% IV SOLN
INTRAVENOUS | Status: DC | PRN
  Administered 2021-12-23: 25 ug/min via INTRAVENOUS

## 2021-12-23 MED ORDER — PHENYLEPHRINE 80 MCG/ML (10ML) SYRINGE FOR IV PUSH (FOR BLOOD PRESSURE SUPPORT)
PREFILLED_SYRINGE | INTRAVENOUS | Status: DC | PRN
  Administered 2021-12-23: 80 ug via INTRAVENOUS
  Administered 2021-12-23: 160 ug via INTRAVENOUS

## 2021-12-23 MED ORDER — SPIRONOLACTONE 12.5 MG HALF TABLET
12.5000 mg | ORAL_TABLET | Freq: Every day | ORAL | Status: DC
Start: 2021-12-23 — End: 2021-12-24
  Administered 2021-12-23 – 2021-12-24 (×2): 12.5 mg via ORAL
  Filled 2021-12-23 (×2): qty 1

## 2021-12-23 MED ORDER — PROPOFOL 500 MG/50ML IV EMUL
INTRAVENOUS | Status: DC | PRN
  Administered 2021-12-23: 100 ug/kg/min via INTRAVENOUS

## 2021-12-23 MED ORDER — LEVOTHYROXINE SODIUM 50 MCG PO TABS
50.0000 ug | ORAL_TABLET | Freq: Every day | ORAL | Status: DC
  Administered 2021-12-23 – 2021-12-24 (×2): 50 ug via ORAL
  Filled 2021-12-23 (×2): qty 1

## 2021-12-23 MED ORDER — BUPIVACAINE HCL (PF) 0.25 % IJ SOLN
INTRAMUSCULAR | Status: AC
  Filled 2021-12-23: qty 30

## 2021-12-23 MED ORDER — SODIUM CHLORIDE 0.9 % IV SOLN: INTRAVENOUS | Status: DC

## 2021-12-23 MED ORDER — ALBUMIN HUMAN 5 % IV SOLN
INTRAVENOUS | Status: AC
  Filled 2021-12-23: qty 250

## 2021-12-23 MED ORDER — HEPARIN SODIUM (PORCINE) 1000 UNIT/ML IJ SOLN
INTRAMUSCULAR | Status: AC
  Filled 2021-12-23: qty 10

## 2021-12-23 MED ORDER — ASPIRIN 81 MG PO CHEW
81.0000 mg | CHEWABLE_TABLET | Freq: Every day | ORAL | Status: DC
  Administered 2021-12-23 – 2021-12-24 (×2): 81 mg via ORAL
  Filled 2021-12-23 (×2): qty 1

## 2021-12-23 MED ORDER — LORAZEPAM 0.5 MG PO TABS
0.5000 mg | ORAL_TABLET | Freq: Two times a day (BID) | ORAL | Status: DC | PRN
Start: 2021-12-23 — End: 2021-12-24

## 2021-12-23 MED ORDER — CIPROFLOXACIN HCL 0.3 % OP SOLN: 1.0000 [drp] | Freq: Four times a day (QID) | OPHTHALMIC | Status: DC

## 2021-12-23 MED ORDER — METFORMIN HCL ER 500 MG PO TB24
1000.0000 mg | ORAL_TABLET | Freq: Every day | ORAL | Status: DC
  Administered 2021-12-24: 1000 mg via ORAL
  Filled 2021-12-23: qty 2

## 2021-12-23 MED ORDER — SODIUM CHLORIDE 0.9% FLUSH
3.0000 mL | INTRAVENOUS | Status: DC | PRN
  Administered 2021-12-23: 3 mL via INTRAVENOUS

## 2021-12-23 MED ORDER — DAPAGLIFLOZIN PROPANEDIOL 10 MG PO TABS
10.0000 mg | ORAL_TABLET | Freq: Every day | ORAL | Status: DC
  Administered 2021-12-24: 10 mg via ORAL
  Filled 2021-12-23: qty 1

## 2021-12-23 MED ORDER — SODIUM CHLORIDE 0.9 % IV SOLN: 250.0000 mL | INTRAVENOUS | Status: DC | PRN

## 2021-12-23 MED ORDER — SODIUM CHLORIDE 0.9% FLUSH
3.0000 mL | Freq: Two times a day (BID) | INTRAVENOUS | Status: DC
  Administered 2021-12-23: 3 mL via INTRAVENOUS

## 2021-12-23 MED ORDER — HEPARIN SODIUM (PORCINE) 1000 UNIT/ML IJ SOLN
INTRAMUSCULAR | Status: DC | PRN
  Administered 2021-12-23: 5000 [IU] via INTRAVENOUS
  Administered 2021-12-23: 3000 [IU] via INTRAVENOUS

## 2021-12-23 MED ORDER — ACETAMINOPHEN 500 MG PO TABS: 1000.0000 mg | ORAL_TABLET | Freq: Four times a day (QID) | ORAL | Status: DC | PRN

## 2021-12-23 MED ORDER — PROTAMINE SULFATE 10 MG/ML IV SOLN
INTRAVENOUS | Status: DC | PRN
  Administered 2021-12-23: 40 mg via INTRAVENOUS

## 2021-12-23 MED ORDER — FENTANYL CITRATE (PF) 100 MCG/2ML IJ SOLN
INTRAMUSCULAR | Status: DC | PRN
  Administered 2021-12-23 (×2): 25 ug via INTRAVENOUS

## 2021-12-23 MED ORDER — PROSIGHT PO TABS
1.0000 | ORAL_TABLET | Freq: Every day | ORAL | Status: DC
  Administered 2021-12-23 – 2021-12-24 (×2): 1 via ORAL
  Filled 2021-12-23 (×2): qty 1

## 2021-12-23 MED ORDER — ONDANSETRON HCL 4 MG/2ML IJ SOLN: 4.0000 mg | Freq: Four times a day (QID) | INTRAMUSCULAR | Status: DC | PRN

## 2021-12-23 MED ORDER — HEPARIN (PORCINE) IN NACL 1000-0.9 UT/500ML-% IV SOLN
INTRAVENOUS | Status: DC | PRN
  Administered 2021-12-23: 500 mL

## 2021-12-23 MED ORDER — ROSUVASTATIN CALCIUM 20 MG PO TABS
20.0000 mg | ORAL_TABLET | Freq: Every day | ORAL | Status: DC
  Administered 2021-12-23 – 2021-12-24 (×2): 20 mg via ORAL
  Filled 2021-12-23 (×2): qty 1

## 2021-12-23 MED ORDER — ACETAMINOPHEN 325 MG PO TABS: 650.0000 mg | ORAL_TABLET | ORAL | Status: DC | PRN

## 2021-12-23 MED ORDER — NITROGLYCERIN 0.4 MG SL SUBL
0.4000 mg | SUBLINGUAL_TABLET | SUBLINGUAL | Status: DC | PRN
Start: 2021-12-23 — End: 2021-12-24

## 2021-12-23 MED ORDER — ESCITALOPRAM OXALATE 20 MG PO TABS
20.0000 mg | ORAL_TABLET | Freq: Every day | ORAL | Status: DC
  Administered 2021-12-23 – 2021-12-24 (×2): 20 mg via ORAL
  Filled 2021-12-23 (×2): qty 1

## 2021-12-23 MED ORDER — ALBUMIN HUMAN 5 % IV SOLN
12.5000 g | Freq: Once | INTRAVENOUS | Status: AC
Start: 2021-12-23 — End: 2021-12-23
  Administered 2021-12-23: 12.5 g via INTRAVENOUS

## 2021-12-23 MED ORDER — ARIPIPRAZOLE 2 MG PO TABS
5.0000 mg | ORAL_TABLET | Freq: Every day | ORAL | Status: DC
  Administered 2021-12-23 – 2021-12-24 (×2): 5 mg via ORAL
  Filled 2021-12-23 (×2): qty 3

## 2021-12-23 MED ORDER — BUPIVACAINE HCL (PF) 0.25 % IJ SOLN
INTRAMUSCULAR | Status: DC | PRN
  Administered 2021-12-23: 15 mL

## 2021-12-23 MED ORDER — MEXILETINE HCL 150 MG PO CAPS
300.0000 mg | ORAL_CAPSULE | Freq: Two times a day (BID) | ORAL | Status: DC
  Administered 2021-12-23: 300 mg via ORAL
  Filled 2021-12-23 (×2): qty 2

## 2021-12-23 MED ORDER — URSODIOL 300 MG PO CAPS
300.0000 mg | ORAL_CAPSULE | Freq: Two times a day (BID) | ORAL | Status: DC
  Administered 2021-12-23 – 2021-12-24 (×2): 300 mg via ORAL
  Filled 2021-12-23 (×2): qty 1

## 2021-12-23 SURGICAL SUPPLY — 13 items
CATH 8FR REPROCESSED SOUNDSTAR (CATHETERS) ×2 IMPLANT
CATH 8FR SOUNDSTAR REPROCESSED (CATHETERS) IMPLANT
CATH JOSEPH QUAD ALLRED 6F REP (CATHETERS) ×1 IMPLANT
CATH SMTCH THERMOCOOL SF DF (CATHETERS) ×1 IMPLANT
CLOSURE PERCLOSE PROSTYLE (VASCULAR PRODUCTS) ×4 IMPLANT
PACK EP LATEX FREE (CUSTOM PROCEDURE TRAY) ×2
PACK EP LF (CUSTOM PROCEDURE TRAY) ×1 IMPLANT
PAD DEFIB RADIO PHYSIO CONN (PAD) ×2 IMPLANT
PATCH CARTO3 (PAD) ×1 IMPLANT
SHEATH PINNACLE 7F 10CM (SHEATH) ×1 IMPLANT
SHEATH PINNACLE 8F 10CM (SHEATH) ×2 IMPLANT
SHEATH PINNACLE 9F 10CM (SHEATH) ×1 IMPLANT
TUBING SMART ABLATE COOLFLOW (TUBING) ×1 IMPLANT

## 2021-12-23 NOTE — Transfer of Care (Signed)
Immediate Anesthesia Transfer of Care Note  Patient: Shane [name]  Procedure(s) Performed: PVC ABLATION  Patient Location: Cath Lab  Anesthesia Type:MAC  Level of Consciousness: awake, alert  and oriented  Airway & Oxygen Therapy: Patient Spontanous Breathing  Post-op Assessment: Report given to RN, Post -op Vital signs reviewed and stable and Patient moving all extremities  Post vital signs: Reviewed and stable  Last Vitals:  Vitals Value Taken Time  BP 93/63 12/23/21 1249  Temp    Pulse 73 12/23/21 1252  Resp 15 12/23/21 1252  SpO2 100 % 12/23/21 1252  Vitals shown include unvalidated device data.  Last Pain:  Vitals:   12/23/21 0920  TempSrc: Oral  PainSc: 0-No pain         Complications: There were no known notable events for this encounter.

## 2021-12-23 NOTE — Anesthesia Postprocedure Evaluation (Signed)
Anesthesia Post Note  Patient: Shane [name]  Procedure(s) Performed: PVC ABLATION     Patient location during evaluation: PACU Anesthesia Type: MAC Level of consciousness: awake and alert Pain management: pain level controlled Vital Signs Assessment: post-procedure vital signs reviewed and stable Respiratory status: spontaneous breathing, nonlabored ventilation, respiratory function stable and patient connected to nasal cannula oxygen Cardiovascular status: stable and blood pressure returned to baseline Postop Assessment: no apparent nausea or vomiting Anesthetic complications: no   There were no known notable events for this encounter.  Last Vitals:  Vitals:   12/23/21 1350 12/23/21 1405  BP: (!) 85/59 (!) 87/56  Pulse: 77 78  Resp: 19 16  Temp:    SpO2: 100% 100%    Last Pain:  Vitals:   12/23/21 1255  TempSrc:   PainSc: 0-No pain                 Belenda Cruise P Selenia Mihok

## 2021-12-23 NOTE — Progress Notes (Signed)
Pt admitted to rm 23 from cath lab. CHG wipe given. Initiated tele. Oriented pt to the unit. Call bell within reach.   Lavenia Atlas, RN

## 2021-12-23 NOTE — Anesthesia Procedure Notes (Addendum)
Procedure Name: MAC Date/Time: 12/23/2021 10:29 AM  Performed by: Kyung Rudd, CRNAPre-anesthesia Checklist: Patient identified, Emergency Drugs available, Suction available and Patient being monitored Patient Re-evaluated:Patient Re-evaluated prior to induction Oxygen Delivery Method: Nasal cannula Induction Type: IV induction Placement Confirmation: positive ETCO2 Dental Injury: Teeth and Oropharynx as per pre-operative assessment

## 2021-12-23 NOTE — Interval H&P Note (Signed)
History and Physical Interval Note:  12/23/2021 9:48 AM  Shane [name]  has presented today for surgery, with the diagnosis of pvc.  The various methods of treatment have been discussed with the patient and family. After consideration of risks, benefits and other options for treatment, the patient has consented to  Procedure(s): PVC ABLATION (N/A) as a surgical intervention.  The patient's history has been reviewed, patient examined, no change in status, stable for surgery.  I have reviewed the patient's chart and labs.  Questions were answered to the patient's satisfaction.     Gayleen Sholtz [employer]

## 2021-12-23 NOTE — Anesthesia Preprocedure Evaluation (Addendum)
Anesthesia Evaluation  Patient identified by MRN, date of birth, ID band Patient awake    Reviewed: Allergy & Precautions, NPO status , Patient's Chart, lab work & pertinent test results  Airway Mallampati: II  TM Distance: >3 FB Neck ROM: Full    Dental no notable dental hx.    Pulmonary sleep apnea ,    Pulmonary exam normal        Cardiovascular hypertension, Pt. on medications and Pt. on home beta blockers + CAD, + Cardiac Stents (2011) and + CABG (2015)  + dysrhythmias (PVCs)  Rhythm:Irregular Rate:Normal     Neuro/Psych Anxiety Depression negative neurological ROS     GI/Hepatic negative GI ROS,   Endo/Other  diabetes, Well Controlled, Type 2, Oral Hypoglycemic AgentsHypothyroidism   Renal/GU negative Renal ROS     Musculoskeletal negative musculoskeletal ROS (+)   Abdominal Normal abdominal exam  (+)   Peds  Hematology   Anesthesia Other Findings   Reproductive/Obstetrics                             Anesthesia Physical Anesthesia Plan  ASA: 3  Anesthesia Plan: MAC   Post-op Pain Management:    Induction: Intravenous  PONV Risk Score and Plan: 1 and Ondansetron, Dexamethasone, Midazolam and Treatment may vary due to age or medical condition  Airway Management Planned: Simple Face Mask, Nasal Cannula and Natural Airway  Additional Equipment: None  Intra-op Plan:   Post-operative Plan:   Informed Consent: I have reviewed the patients History and Physical, chart, labs and discussed the procedure including the risks, benefits and alternatives for the proposed anesthesia with the patient or authorized representative who has indicated his/her understanding and acceptance.     Dental advisory given  Plan Discussed with: CRNA  Anesthesia Plan Comments: (Lab Results      Component                Value               Date                      WBC                      8.1                  12/21/2021                HGB                      13.9                12/21/2021                HCT                      41.0                12/21/2021                MCV                      97                  12/21/2021                PLT  309                 12/21/2021           Lab Results      Component                Value               Date                      NA                       139                 12/08/2021                K                        4.5                 12/08/2021                CO2                      30                  12/08/2021                GLUCOSE                  129 (H)             12/08/2021                BUN                      21 (H)              12/08/2021                CREATININE               1.31 (H)            12/08/2021                CALCIUM                  9.4                 12/08/2021                EGFR                     46 (L)              08/27/2021                GFRNONAA                 >60                 12/08/2021             ECHO 03/23: 1. Left ventricular ejection fraction, by estimation, is 30 to 35%. The  left ventricle has moderately decreased function. The left ventricle  demonstrates regional wall motion abnormalities (see scoring  diagram/findings for description). Left ventricular  diastolic parameters are consistent with Grade II diastolic dysfunction  (  pseudonormalization). Elevated left atrial pressure. There is akinesis of  the left ventricular, apical anteroseptal wall, anterior wall and apical  segment. There is severe  hypokinesis of the left ventricular, basal-mid inferior wall and  inferolateral wall.  2. Right ventricular systolic function is mildly reduced. The right  ventricular size is mildly enlarged. There is normal pulmonary artery  systolic pressure.  3. Left atrial size was mildly dilated.  4. Right atrial size was mildly dilated.  5. The mitral  valve is normal in structure. Mild to moderate mitral valve  regurgitation.  6. The aortic valve is tricuspid. Aortic valve regurgitation is not  visualized. No aortic stenosis is present.  7. The inferior vena cava is dilated in size with >50% respiratory  variability, suggesting right atrial pressure of 8 mmHg. )        Anesthesia Quick Evaluation

## 2021-12-24 ENCOUNTER — Encounter: Payer: Self-pay | Admitting: Cardiology

## 2021-12-24 ENCOUNTER — Encounter (HOSPITAL_COMMUNITY): Payer: Self-pay | Admitting: Cardiology

## 2021-12-24 DIAGNOSIS — E119 Type 2 diabetes mellitus without complications: Secondary | ICD-10-CM | POA: Diagnosis not present

## 2021-12-24 DIAGNOSIS — I110 Hypertensive heart disease with heart failure: Secondary | ICD-10-CM | POA: Diagnosis not present

## 2021-12-24 DIAGNOSIS — Z8571 Personal history of Hodgkin lymphoma: Secondary | ICD-10-CM | POA: Diagnosis not present

## 2021-12-24 DIAGNOSIS — E785 Hyperlipidemia, unspecified: Secondary | ICD-10-CM | POA: Diagnosis not present

## 2021-12-24 DIAGNOSIS — I493 Ventricular premature depolarization: Secondary | ICD-10-CM | POA: Diagnosis not present

## 2021-12-24 DIAGNOSIS — Z79899 Other long term (current) drug therapy: Secondary | ICD-10-CM | POA: Diagnosis not present

## 2021-12-24 DIAGNOSIS — G4730 Sleep apnea, unspecified: Secondary | ICD-10-CM | POA: Diagnosis not present

## 2021-12-24 DIAGNOSIS — Z955 Presence of coronary angioplasty implant and graft: Secondary | ICD-10-CM | POA: Diagnosis not present

## 2021-12-24 DIAGNOSIS — I252 Old myocardial infarction: Secondary | ICD-10-CM | POA: Diagnosis not present

## 2021-12-24 DIAGNOSIS — Z951 Presence of aortocoronary bypass graft: Secondary | ICD-10-CM | POA: Diagnosis not present

## 2021-12-24 DIAGNOSIS — I2510 Atherosclerotic heart disease of native coronary artery without angina pectoris: Secondary | ICD-10-CM | POA: Diagnosis not present

## 2021-12-24 DIAGNOSIS — E039 Hypothyroidism, unspecified: Secondary | ICD-10-CM | POA: Diagnosis not present

## 2021-12-24 DIAGNOSIS — I5022 Chronic systolic (congestive) heart failure: Secondary | ICD-10-CM | POA: Diagnosis not present

## 2021-12-24 DIAGNOSIS — Z7984 Long term (current) use of oral hypoglycemic drugs: Secondary | ICD-10-CM | POA: Diagnosis not present

## 2021-12-24 LAB — GLUCOSE, CAPILLARY: Glucose-Capillary: 91 mg/dL (ref 70–99)

## 2021-12-24 MED ORDER — ENTRESTO 24-26 MG PO TABS: 1.0000 | ORAL_TABLET | Freq: Two times a day (BID) | ORAL | 3 refills | Status: DC

## 2021-12-24 NOTE — Discharge Instructions (Signed)

## 2021-12-24 NOTE — Discharge Summary (Addendum)
ELECTROPHYSIOLOGY PROCEDURE DISCHARGE SUMMARY    Patient ID: Shane [name],  MRN: [id number], DOB/AGE: 57/31/1967 57 y.o.  Admit date: 12/23/2021 Discharge date: 12/24/2021  Primary Care Physician: Leamon Arnt, MD  Primary Cardiologist: None  Electrophysiologist: Dr. Curt Bears  Primary Discharge Diagnosis:  PVCs  Secondary Discharge Diagnosis:  Chronic systolic CHF  Procedures This Admission:  Electrophysiology study and radiofrequency catheter ablation of  PVCs  on 12/23/2021 by Dr. Curt Bears.  This study demonstrated: 1. Sinus rhythm upon presentation.  2.  Ablation of PVCs in the left ventricle under the right/left coronary commissure  3. No inducible arrhythmias following ablation.  4. No early apparent complications.    Brief HPI: Shane [name] is a 56 y.o. male with a history of PVCs with burden > 20% and chronic systolic CHF with most recent EF of 30-35%.  They have failed medical therapy with mexiletine. Risks, benefits, and alternatives to catheter ablation of  PVCs  were reviewed with the patient who wished to proceed.    Hospital Course:  The patient was admitted and underwent EPS/RFCA of  PVCs  with details as outlined above.  They were monitored on telemetry overnight which demonstrated significant reduction in PVC burden.  Groin was without complication on the day of discharge.  The patient was examined and considered to be stable for discharge.  Wound care and restrictions were reviewed with the patient.  The patient [name] be seen back by  Dr. Curt Bears  in 4 weeks.   Mexiletine discontinued. Given hypotension,   Physical Exam: Vitals:   12/23/21 1734 12/23/21 1940 12/23/21 2307 12/24/21 0544  BP: 94/70 1'14/80 92/68 90/65 '$  Pulse: 61 60 60 62  Resp: '16 18 14 17  '$ Temp: 98.3 F (36.8 C) 97.7 F (36.5 C) 97.9 F (36.6 C) (!) 97.4 F (36.3 C)  TempSrc: Oral Oral Oral Oral  SpO2: 100% 100% 98% 98%  Weight: 63.5 kg     Height: '5\' 7"'$  (1.702 m)        GEN- The patient is well appearing, alert and oriented x 3 today.   HEENT: normocephalic, atraumatic; sclera clear, conjunctiva pink; hearing intact; oropharynx clear; neck supple  Lungs- Clear to ausculation bilaterally, normal work of breathing.  No wheezes, rales, rhonchi Heart- Regular rate and rhythm, no murmurs, rubs or gallops  GI- soft, non-tender, non-distended, bowel sounds present  Extremities- no clubbing, cyanosis, or edema; DP/PT/radial pulses 2+ bilaterally, groin without hematoma/bruit MS- no significant deformity or atrophy Skin- warm and dry, no rash or lesion Psych- euthymic mood, full affect Neuro- strength and sensation are intact   Labs:   Lab Results  Component Value Date   WBC 8.1 12/21/2021   HGB 13.9 12/21/2021   HCT 41.0 12/21/2021   MCV 97 12/21/2021   PLT 309 12/21/2021   No results for input(s): "NA", "K", "CL", "CO2", "BUN", "CREATININE", "CALCIUM", "PROT", "BILITOT", "ALKPHOS", "ALT", "AST", "GLUCOSE" in the last 168 hours.  Invalid input(s): "LABALBU"   Discharge Medications:  Allergies as of 12/24/2021   No Known Allergies      Medication List     STOP taking these medications    mexiletine 150 MG capsule Commonly known as: MEXITIL       TAKE these medications    acetaminophen 500 MG tablet Commonly known as: TYLENOL Take 1,000 mg by mouth every 6 (six) hours as needed for headache (pain).   ARIPiprazole 5 MG tablet Commonly known as: ABILIFY Take 1 tablet (5 mg  total) by mouth daily.   aspirin 81 MG tablet Take 81 mg by mouth daily.   AVASTIN IV Inject 1 Dose into the eye See admin instructions. Every six to eight weeks in left eye   carvedilol 3.125 MG tablet Commonly known as: COREG Take 1 tablet by mouth twice daily.   ciprofloxacin 0.3 % ophthalmic solution Commonly known as: CILOXAN Place 1 drop into the left eye 4 (four) times daily. After Eye injection every 6-8 weeks for two days   dapagliflozin  propanediol 10 MG Tabs tablet Commonly known as: Farxiga Take 1 tablet (10 mg total) by mouth daily before breakfast.   Entresto 24-26 MG Generic drug: sacubitril-valsartan Take 1 tablet by mouth 2 (two) times daily. Start taking on: December 25, 2021   escitalopram 20 MG tablet Commonly known as: LEXAPRO Take 1 tablet (20 mg total) by mouth daily.   levothyroxine 50 MCG tablet Commonly known as: SYNTHROID Take 1 tablet (50 mcg total) by mouth daily.   LORazepam 0.5 MG tablet Commonly known as: ATIVAN Take 1 tablet (0.5 mg total) by mouth 2 (two) times daily as needed for anxiety.   metFORMIN 500 MG 24 hr tablet Commonly known as: GLUCOPHAGE-XR Take 2 tablets (1,000 mg total) by mouth daily with breakfast.   nitroGLYCERIN 0.4 MG SL tablet Commonly known as: Nitrostat Place 1 tablet (0.4 mg total) under the tongue every 5 (five) minutes as needed for chest pain.   PRESERVISION AREDS 2 PO Take 2 tablets by mouth in the morning.   rosuvastatin 20 MG tablet Commonly known as: CRESTOR Take 1 tablet by mouth daily. **Please call to schedule office visit prior to further refills**   spironolactone 25 MG tablet Commonly known as: ALDACTONE Take 0.5 tablets (12.5 mg total) by mouth at bedtime.   torsemide 20 MG tablet Commonly known as: DEMADEX Take 1 tablet (20 mg total) by mouth daily. For weight gain and/or swelling   ursodiol 500 MG tablet Commonly known as: ACTIGALL Take 500 mg by mouth 2 (two) times daily.        Disposition:    Follow-up Information     Constance Haw, MD Follow up.   Specialty: Cardiology Why: on 7/24 at 1115 Contact information: [place] [address] [phone]                 Duration of Discharge Encounter: Greater than 30 minutes including physician time.  Signed, Shirley Friar, PA-C  12/24/2021 8:28 AM   I have seen and examined this patient with Oda Kilts.  Agree with above,  note added to reflect my findings.  Patient was admitted to the hospital post PVC ablation.  PVCs were originating from inferior to the right left coronary cusps inferior on the ventricular side of the valve to the commissure.  He has had a significant reduction in his PVC burden overnight.  We Efraim Vanallen plan for discharge home with follow-up in clinic.    GEN: Well nourished, well developed, in no acute distress  HEENT: normal  Neck: no JVD, carotid bruits, or masses Cardiac: RRR; no murmurs, rubs, or gallops,no edema  Respiratory:  clear to auscultation bilaterally, normal work of breathing GI: soft, nontender, nondistended, + BS MS: no deformity or atrophy  Skin: warm and dry,  Neuro:  Strength and sensation are intact Psych: euthymic mood, full affect    Tnia Anglada M. Janith Nielson MD 12/24/2021 10:28 AM

## 2021-12-24 NOTE — Progress Notes (Signed)
Mobility Specialist Progress Note:   12/24/21 0950  Mobility  Activity Ambulated independently in hallway  Level of Assistance Independent  Assistive Device None  Distance Ambulated (ft) 550 ft  Activity Response Tolerated well  $Mobility charge 1 Mobility   Pt ambulating independently in hallway with wife. No complaints.   [facility] Sritha Chauncey Mobility Specialist

## 2021-12-24 NOTE — Progress Notes (Signed)
D/c tele and IV. Went over AVS with pt and all question were addressed.   Taejon Irani S Marquinn Meschke, RN  

## 2021-12-24 NOTE — Progress Notes (Signed)
Cardiology Clinic Note   Patient Name: Shane [name] Date of Encounter: 12/25/2021  Primary Care Provider:  Leamon Arnt, MD Primary Cardiologist:  [facility]   Patient Profile    56 year old male with history of chronic systolic heart failure most recent echo revealing EF of 30 to 35% (recent hospitalization for EP study and radiofrequency catheter ablation of PVCs on 12/23/2021 by Dr. Curt Bears, hyperlipidemia, type 2 diabetes, hypothyroidism, depression.  During recent procedure and overnight stay, mexiletine was discontinued in the setting of hypotension with a blood pressure of 94/70.  Past Medical History    Past Medical History:  Diagnosis Date   CAD (coronary artery disease), s/p stent 2011 and CABG 2015 [facility])    Hodgkin disease [facility]) 1997   Hyperlipidemia    Macular degeneration    PVC's (premature ventricular contractions)    Past Surgical History:  Procedure Laterality Date   CARDIAC CATHETERIZATION  08/2009   PCI -LAD 2.66mx18mm Xience   CORONARY ARTERY BYPASS GRAFT  02/2014   PVC ABLATION N/A 12/23/2021   Procedure: PVC ABLATION;  Surgeon: CConstance Haw MD;  [place] [facility];  Service: Cardiovascular;  Laterality: N/A;   RIGHT/LEFT HEART CATH AND CORONARY ANGIOGRAPHY N/A 08/03/2021   Procedure: RIGHT/LEFT HEART CATH AND CORONARY ANGIOGRAPHY;  Surgeon: BLorretta Harp MD;  [place] [facility];  Service: Cardiovascular;  Laterality: N/A;    Allergies  No Known Allergies  History of Present Illness    Mr. BBranderpresents today status post EP/RFCA of PVCs by Dr. CCurt Bearson 12/23/2021 with significant reduction in PVC burden on follow-up telemetry overnight.  During hospitalization for same, mexiletine was discontinued in the setting of hypotension.  He was continued on carvedilol 3.125 mg twice daily (Entresto 24/26 mg twice daily which was to be given on December 25, 2021, spironolactone 12.5 mg at at bedtime,  torsemide 20 mg daily.   He comes today with his wife feeling better.  He states he is beginning to have a little bit more energy.  He has no complaints of catheter insertion site of right groin causing him pain or bleeding.  He has noticed that his blood pressure has gotten some better compared to previous blood pressures in the 8[address] 750Psystolic.  At one point he was having lightheadedness and dizziness with position changes.  This is slowly getting better.  His weight has been maintained within 3 to 5 pounds.  He denies any dyspnea on exertion, lower extremity edema, PND, or chest discomfort.  He denies feeling palpitations or his heart skipping.  Home Medications    Current Outpatient Medications  Medication Sig Dispense Refill   acetaminophen (TYLENOL) 500 MG tablet Take 1,000 mg by mouth every 6 (six) hours as needed for headache (pain).     ARIPiprazole (ABILIFY) 5 MG tablet Take 1 tablet (5 mg total) by mouth daily. 90 tablet 1   aspirin 81 MG tablet Take 81 mg by mouth daily.     Bevacizumab (AVASTIN IV) Inject 1 Dose into the eye See admin instructions. Every six to eight weeks in left eye     carvedilol (COREG) 3.125 MG tablet Take 1 tablet by mouth twice daily. 180 tablet 3   ciprofloxacin (CILOXAN) 0.3 % ophthalmic solution Place 1 drop into the left eye 4 (four) times daily. After Eye injection every 6-8 weeks for two days     dapagliflozin propanediol (FARXIGA) 10 MG TABS tablet Take 1 tablet (10 mg  total) by mouth daily before breakfast. 30 tablet 6   escitalopram (LEXAPRO) 20 MG tablet Take 1 tablet (20 mg total) by mouth daily. 90 tablet 1   levothyroxine (SYNTHROID) 50 MCG tablet Take 1 tablet (50 mcg total) by mouth daily. 90 tablet 0   LORazepam (ATIVAN) 0.5 MG tablet Take 1 tablet (0.5 mg total) by mouth 2 (two) times daily as needed for anxiety. 60 tablet 2   metFORMIN (GLUCOPHAGE-XR) 500 MG 24 hr tablet Take 2 tablets (1,000 mg total) by mouth daily with breakfast. 180  tablet 3   Multiple Vitamins-Minerals (PRESERVISION AREDS 2 PO) Take 2 tablets by mouth in the morning.     nitroGLYCERIN (NITROSTAT) 0.4 MG SL tablet Place 1 tablet (0.4 mg total) under the tongue every 5 (five) minutes as needed for chest pain. 25 tablet 6   rosuvastatin (CRESTOR) 20 MG tablet Take 1 tablet by mouth daily. **Please call to schedule office visit prior to further refills** 90 tablet 0   sacubitril-valsartan (ENTRESTO) 24-26 MG Take 1 tablet by mouth 2 (two) times daily. 60 tablet 3   torsemide (DEMADEX) 20 MG tablet Take 1 tablet (20 mg total) by mouth daily. For weight gain and/or swelling 30 tablet 5   ursodiol (ACTIGALL) 500 MG tablet Take 500 mg by mouth 2 (two) times daily.     spironolactone (ALDACTONE) 25 MG tablet Take 0.5 tablets (12.5 mg total) by mouth at bedtime. 15 tablet 11   No current facility-administered medications for this visit.     Family History    Family History  Problem Relation Age of Onset   Stroke Mother    Cancer Mother    COPD Mother    Heart disease Father    COPD Father    Heart attack Father    Heart Problems Brother        heart valve replaced at 80 years old   Heart Problems Brother        MI at age 29; CABG at age 10   Cancer Maternal Grandmother    Colon cancer Neg Hx    He indicated that his mother is deceased. He indicated that his father is deceased. He indicated that both of his brothers are alive. He indicated that his maternal grandmother is deceased. He indicated that his maternal grandfather is deceased. He indicated that his paternal grandmother is deceased. He indicated that his paternal grandfather is deceased. He indicated that the status of his neg hx is unknown.  Social History    Social History   Socioeconomic History   Marital status: Married    Spouse name: Not on file   Number of children: Not on file   Years of education: Not on file   Highest education level: Not on file  Occupational History   Not on  file  Tobacco Use   Smoking status: Never   Smokeless tobacco: Former   Tobacco comments:    a long time ago  Vaping Use   Vaping Use: Never used  Substance and Sexual Activity   Alcohol use: Yes    Alcohol/week: 3.0 standard drinks of alcohol    Types: 3 Cans of beer per week    Comment: sometimes 3 beers daily   Drug use: No   Sexual activity: Yes  Other Topics Concern   Not on file  Social History Narrative   Not on file   Social Determinants of Health   Financial Resource Strain: Not on file  Food  Insecurity: Not on file  Transportation Needs: Not on file  Physical Activity: Not on file  Stress: Not on file  Social Connections: Not on file  Intimate Partner Violence: Not on file     Review of Systems    General:  No chills, fever, night sweats or weight changes.  Cardiovascular:  No chest pain, dyspnea on exertion, edema, orthopnea, palpitations, paroxysmal nocturnal dyspnea. Dermatological: No rash, lesions/masses Respiratory: No cough, dyspnea Urologic: No hematuria, dysuria Abdominal:   No nausea, vomiting, diarrhea, bright red blood per rectum, melena, or hematemesis Neurologic:  No visual changes, wkns, changes in mental status. All other systems reviewed and are otherwise negative except as noted above.     Physical Exam    VS:  BP 92/60 (BP Location: Left Arm)   Pulse (!) 58   Ht 5' 7.5" (1.715 m)   Wt 143 lb 3.2 oz (65 kg)   SpO2 100%   BMI 22.10 kg/m  , BMI Body mass index is 22.1 kg/m.     GEN: Well nourished, well developed, in no acute distress. HEENT: normal. Neck: Supple, no JVD, carotid bruits, or masses. Cardiac: RRR, no murmurs, rubs, or gallops. No clubbing, cyanosis, edema.  Radials/DP/PT 2+ and equal bilaterally.  Respiratory:  Respirations regular and unlabored, clear to auscultation bilaterally. GI: Soft, nontender, nondistended, BS + x 4. MS: no deformity or atrophy.  Catheter insertion site right groin well-healed, no hematoma,  bleeding, or ecchymosis. Skin: warm and dry, no rash. Neuro:  Strength and sensation are intact. Psych: Normal affect.  Accessory Clinical Findings    ECG personally reviewed by me today-sinus bradycardia, first-degree AV block, with left axis deviation.  PR interval 0.262 ms.- No acute changes  Lab Results  Component Value Date   WBC 8.1 12/21/2021   HGB 13.9 12/21/2021   HCT 41.0 12/21/2021   MCV 97 12/21/2021   PLT 309 12/21/2021   Lab Results  Component Value Date   CREATININE 1.31 (H) 12/08/2021   BUN 21 (H) 12/08/2021   NA 139 12/08/2021   K 4.5 12/08/2021   CL 99 12/08/2021   CO2 30 12/08/2021   Lab Results  Component Value Date   ALT 26 10/08/2021   AST 26 10/08/2021   ALKPHOS 299 (H) 10/08/2021   BILITOT 1.0 10/08/2021   Lab Results  Component Value Date   CHOL 146 10/08/2021   HDL 67.70 10/08/2021   LDLCALC 65 10/08/2021   TRIG 67.0 10/08/2021   CHOLHDL 2 10/08/2021    Lab Results  Component Value Date   HGBA1C 7.2 (H) 10/08/2021    Review of Prior Studies: Electrophysiology study and radiofrequency catheter ablation of  PVCs  on 12/23/2021 by Dr. Curt Bears.  This study demonstrated: 1. Sinus rhythm upon presentation.  2.  Ablation of PVCs in the left ventricle under the right/left coronary commissure  3. No inducible arrhythmias following ablation.  4. No early apparent complications.    Echocardiogram 11/10/2021  1. Left ventricular ejection fraction, by estimation, is 30 to 35%. The  left ventricle has moderately decreased function. The left ventricle  demonstrates regional wall motion abnormalities (see scoring  diagram/findings for description). Left ventricular   diastolic parameters are consistent with Grade II diastolic dysfunction  (pseudonormalization). Elevated left atrial pressure. There is akinesis of  the left ventricular, apical anteroseptal wall, anterior wall and apical  segment. There is severe  hypokinesis of the left ventricular,  basal-mid inferior wall and  inferolateral wall.   2.  Right ventricular systolic function is mildly reduced. The right  ventricular size is mildly enlarged. There is normal pulmonary artery  systolic pressure.   3. Left atrial size was mildly dilated.   4. Right atrial size was mildly dilated.   5. The mitral valve is normal in structure. Mild to moderate mitral valve  regurgitation.   6. The aortic valve is tricuspid. Aortic valve regurgitation is not  visualized. No aortic stenosis is present.   7. The inferior vena cava is dilated in size with >50% respiratory  variability, suggesting right atrial pressure of 8 mmHg.   Assessment & Plan   1.  Premature ventricular contractions: Status post radiofrequency catheter ablation of PVCs on 12/23/2021 by Dr. Curt Bears.  He is feeling well, actually states that his energy level has increased some.  He offers no complaints of palpitations, heart skipping, shortness of breath, or fatigue.  Right groin site is well-healed.  I have removed the dressing and applied Band-Aid.  He is advised to wait 1 additional week before lifting anything greater than 20 pounds.  He verbalizes understanding.  2.  Chronic combined CHF: Most recent echocardiogram on 11/10/2021 revealed an EF of 30 to 35% with grade 2 diastolic dysfunction.  He remains on Entresto 2426 mg, carvedilol 3.125 mg twice daily, spironolactone 12.5 mg at at bedtime, and torsemide 20 mg dail daily as needed for edema or weight gain, and Farxiga 10 mg daily.  Weight has been stable.  He feels better currently despite hypotension, but blood pressure has improved since ablation.   I have advised him that if his blood pressure is less than 80/60 and he is symptomatic, he will likely need to hold the spironolactone in the evening.  If this is the case he should report this to our practice.  He is to continue to daily weights and salt avoidance.  He will be seen by Advanced Heart Failure clinic in August  2023.  3.  Coronary artery disease: History of coronary artery bypass grafting in [facility] at [facility].  He offers no complaints of chest discomfort or angina symptoms at this time.  Continue secondary prevention.  4.  Hyperlipidemia: He will continue statin therapy with rosuvastatin 20 mg daily.  LDL goal less than 50 due to high risk patient.  If labs are not completed by next office visit, these should be ordered.  5.  Type 2 diabetes: Patient is followed by PCP with labs and management.    Current medicines are reviewed at length with the patient today.  I have spent 25 min's  dedicated to the care of this patient on the date of this encounter to include pre-visit review of records, assessment, management and diagnostic testing,with shared decision making.   Signed, Phill Myron. West Pugh, ANP, AACC   12/25/2021 4:06 PM    [facility] [place] [address] Office [phone] Fax [phone]  Notice: This dictation was prepared with Dragon dictation along with smaller phrase technology. Any transcriptional errors that result from this process are unintentional and may not be corrected upon review.

## 2021-12-24 NOTE — Telephone Encounter (Signed)
Discussed with Dr. Curt Bears. Pt advised to hold Entresto, restart on Monday. Advised to send [place] update next week on how he is doing. Advised if worsening before then to call office/go to ED for evaluation if needed. Patient verbalized understanding and agreeable to plan.

## 2021-12-25 ENCOUNTER — Encounter: Payer: Self-pay | Admitting: Cardiology

## 2021-12-25 ENCOUNTER — Encounter: Payer: Self-pay | Admitting: Adult Health

## 2021-12-25 ENCOUNTER — Ambulatory Visit (INDEPENDENT_AMBULATORY_CARE_PROVIDER_SITE_OTHER): Payer: BC Managed Care – PPO | Admitting: Adult Health

## 2021-12-25 VITALS — BP 92/60 | HR 58 | Ht 67.5 in | Wt 143.2 lb

## 2021-12-25 DIAGNOSIS — I5020 Unspecified systolic (congestive) heart failure: Secondary | ICD-10-CM

## 2021-12-25 DIAGNOSIS — I493 Ventricular premature depolarization: Secondary | ICD-10-CM | POA: Diagnosis not present

## 2021-12-25 MED ORDER — SPIRONOLACTONE 25 MG PO TABS: 12.5000 mg | ORAL_TABLET | Freq: Every evening | ORAL | 11 refills | Status: DC

## 2021-12-25 NOTE — Patient Instructions (Addendum)
Medication Instructions:  No Changes *If you need a refill on your cardiac medications before your next appointment, please call your pharmacy*   Lab Work: No Lab If you have labs (blood work) drawn today and your tests are completely normal, you will receive your results only by: [place] (if you have MyChart) OR A paper copy in the mail If you have any lab test that is abnormal or we need to change your treatment, we will call you to review the results.   Testing/Procedures: No Testing   Follow-Up: At [facility], you and your health needs are our priority.  As part of our continuing mission to provide you with exceptional heart care, we have created designated Provider Care Teams.  These Care Teams include your primary Cardiologist (physician) and Advanced Practice Providers (APPs -  Physician Assistants and Nurse Practitioners) who all work together to provide you with the care you need, when you need it.  We recommend signing up for the patient portal called "MyChart".  Sign up information is provided on this After Visit Summary.  MyChart is used to connect with patients for Virtual Visits (Telemedicine).  Patients are able to view lab/test results, encounter notes, upcoming appointments, etc.  Non-urgent messages can be sent to your provider as well.   To learn more about what you can do with MyChart, go to [email or web address].    Your next appointment:   Keep Scheduled Appointment  The format for your next appointment:   In Person  Provider:   Ocie Cornfield, MD     Other Instructions If Blood Pressure less than 80/60 . Do Not Take Spironolactone.  Important Information About Sugar

## 2022-01-10 ENCOUNTER — Other Ambulatory Visit: Payer: Self-pay | Admitting: Family Medicine

## 2022-01-13 ENCOUNTER — Encounter: Payer: Self-pay | Admitting: Cardiology

## 2022-01-13 DIAGNOSIS — G4733 Obstructive sleep apnea (adult) (pediatric): Secondary | ICD-10-CM | POA: Diagnosis not present

## 2022-01-14 ENCOUNTER — Ambulatory Visit (INDEPENDENT_AMBULATORY_CARE_PROVIDER_SITE_OTHER): Payer: BC Managed Care – PPO | Admitting: Family Medicine

## 2022-01-14 ENCOUNTER — Encounter: Payer: Self-pay | Admitting: Family Medicine

## 2022-01-14 VITALS — BP 78/42 | HR 68 | Temp 98.4°F | Ht 67.5 in | Wt 141.0 lb

## 2022-01-14 DIAGNOSIS — I952 Hypotension due to drugs: Secondary | ICD-10-CM | POA: Diagnosis not present

## 2022-01-14 DIAGNOSIS — I152 Hypertension secondary to endocrine disorders: Secondary | ICD-10-CM

## 2022-01-14 DIAGNOSIS — I2510 Atherosclerotic heart disease of native coronary artery without angina pectoris: Secondary | ICD-10-CM

## 2022-01-14 DIAGNOSIS — E1159 Type 2 diabetes mellitus with other circulatory complications: Secondary | ICD-10-CM | POA: Diagnosis not present

## 2022-01-14 DIAGNOSIS — I5020 Unspecified systolic (congestive) heart failure: Secondary | ICD-10-CM | POA: Diagnosis not present

## 2022-01-14 DIAGNOSIS — E039 Hypothyroidism, unspecified: Secondary | ICD-10-CM

## 2022-01-14 LAB — BASIC METABOLIC PANEL
BUN: 79 mg/dL — ABNORMAL HIGH (ref 6–23)
CO2: 24 mEq/L (ref 19–32)
Calcium: 9.6 mg/dL (ref 8.4–10.5)
Chloride: 99 mEq/L (ref 96–112)
Creatinine, Ser: 2.45 mg/dL — ABNORMAL HIGH (ref 0.40–1.50)
GFR: 28.58 mL/min — ABNORMAL LOW (ref >60.00)
Glucose, Bld: 109 mg/dL — ABNORMAL HIGH (ref 70–99)
Potassium: 4.4 mEq/L (ref 3.5–5.1)
Sodium: 133 mEq/L — ABNORMAL LOW (ref 135–145)

## 2022-01-14 LAB — POCT GLYCOSYLATED HEMOGLOBIN (HGB A1C): Hemoglobin A1C: 5.6 % (ref 4.0–5.6)

## 2022-01-14 LAB — MICROALBUMIN / CREATININE URINE RATIO
Creatinine,U: 123.3 mg/dL
Microalb Creat Ratio: 4.2 mg/g (ref 0.0–30.0)
Microalb, Ur: 5.2 mg/dL — ABNORMAL HIGH (ref 0.0–1.9)

## 2022-01-14 LAB — TSH: TSH: 11.05 u[IU]/mL — ABNORMAL HIGH (ref 0.35–5.50)

## 2022-01-14 NOTE — Progress Notes (Signed)
Subjective  CC:  Chief Complaint  Patient presents with   Diabetes    Pt here to F/U with DM. Pt statred that he still has been experiencing low BP.     HPI: Shane [name] is a 56 y.o. male who presents to the office today for follow up of diabetes and problems listed above in the chief complaint.  Diabetes follow up:new onset: diet had increased sugars: a1c 7.2 and added met 1000 bid to farxiga. Tolerating well.  His diabetic control is reported as Improved.  He denies exertional CP or SOB or symptomatic hypoglycemia. He denies foot sores or paresthesias.  Heart failure and CAD: on meds and now with symptomatic low blood pressure. Has message in to Dr. Curt Bears. No cp or palpitations. No syncope. + orthostatic lightheadedness. Hypothyroidism now on levothyroxine 69mg daily. Perhaps a little less fatigue but otherwise feels the same. No leg edema or skin/hair changes.   Wt Readings from Last 3 Encounters:  01/14/22 141 lb (64 kg)  12/25/21 143 lb 3.2 oz (65 kg)  12/23/21 139 lb 15.9 oz (63.5 kg)    BP Readings from Last 3 Encounters:  01/14/22 (!) 78/42  12/25/21 92/60  12/24/21 (!) 101/55    Assessment  1. Hypertension associated with type 2 diabetes mellitus ([place]   2. Coronary artery disease involving native coronary artery of native heart without angina pectoris   3. Acquired hypothyroidism   4. Hypotension due to drugs   5. HFrEF (heart failure with reduced ejection fraction) ([place]      Plan  Diabetes is currently very well controlled. Cont metformin and farxiga. Check urine microalbuminuria screen.  Hypotension: cards to adjust meds further. Holding diuretics have not helped. Cautioned against falls.  Hypothyroidism: recheck tsh and adjust dose of levothx accordingly. He feels well.   Follow up: 3 mo for recheck. Orders Placed This Encounter  Procedures   TSH   Basic metabolic panel   Microalbumin / creatinine urine ratio   POCT HgB A1C   No orders of the  defined types were placed in this encounter.     Immunization History  Administered Date(s) Administered   Hepatitis A, Adult 10/08/2021   Hepatitis B, adult 11/12/2021   Hepb-cpg 10/08/2021   Influenza,inj,Quad PF,6+ Mos 04/03/2018, 04/03/2019, 04/10/2021   Influenza-Unspecified 03/26/2020   PFIZER Comirnaty(Gray Top)Covid-19 Tri-Sucrose Vaccine 10/30/2020   PFIZER(Purple Top)SARS-COV-2 Vaccination 10/22/2019, 11/12/2019   Tdap 11/16/2017   Zoster Recombinat (Shingrix) 10/09/2020, 04/10/2021    Diabetes Related Lab Review: Lab Results  Component Value Date   HGBA1C 5.6 01/14/2022   HGBA1C 7.2 (H) 10/08/2021   HGBA1C 5.9 04/10/2021    No results found for: "MICROALBUR", "[id number]" Lab Results  Component Value Date   CREATININE 1.31 (H) 12/08/2021   BUN 21 (H) 12/08/2021   NA 139 12/08/2021   K 4.5 12/08/2021   CL 99 12/08/2021   CO2 30 12/08/2021   Lab Results  Component Value Date   CHOL 146 10/08/2021   CHOL 103 10/09/2020   CHOL 109 10/19/2019   Lab Results  Component Value Date   HDL 67.70 10/08/2021   HDL 44.50 10/09/2020   HDL 44 10/19/2019   Lab Results  Component Value Date   LDLCALC 65 10/08/2021   LDLCALC 48 10/09/2020   LDLCALC 50 10/19/2019   Lab Results  Component Value Date   TRIG 67.0 10/08/2021   TRIG 56.0 10/09/2020   TRIG 75 10/19/2019   Lab Results  Component Value Date  CHOLHDL 2 10/08/2021   CHOLHDL 2 10/09/2020   CHOLHDL 2.5 10/19/2019   Lab Results  Component Value Date   TSH 62.93 (H) 10/08/2021     I have reviewed the PMH, Fam and Soc history. Patient Active Problem List   Diagnosis Date Noted   CAD (coronary artery disease) 12/03/2019    Priority: High   Essential hypertension 07/24/2019    Priority: High    Essential hypertension    OSA on CPAP 09/07/2018    Priority: High    Obstructive sleep apnea    GAD (generalized anxiety disorder) 05/25/2018    Priority: High   Major depression, recurrent, chronic  ([place]) 05/25/2018    Priority: High   S/P CABG (coronary artery bypass graft) 11/16/2017    Priority: High    CAD    Mixed hyperlipidemia 05/24/2014    Priority: High    Hyperlipidemia    Alcohol use disorder, mild, abuse 03/25/2019    Priority: Medium    Insulin resistance 11/16/2017    Priority: Medium    Acquired hypothyroidism 11/16/2017    Priority: Medium     10/2021    Macular degeneration     Priority: Medium    History of Hodgkin's lymphoma 07/13/1995    Priority: Medium     Treated 30s; cured.    PVC's (premature ventricular contractions) 09/11/2014    Priority: Low    Frequent PVCs    Hypertension associated with type 2 diabetes mellitus ([place]) 01/14/2022   PVC (premature ventricular contraction) 12/23/2021   Mediastinal adenopathy 10/20/2021   Primary biliary cholangitis ([place]) 10/08/2021   HFrEF (heart failure with reduced ejection fraction) ([place]) 08/18/2021   LV dysfunction 07/24/2021    LV dysfunction     Social History: Patient  reports that he has never smoked. He has quit using smokeless tobacco. He reports current alcohol use of about 3.0 standard drinks of alcohol per week. He reports that he does not use drugs.  Review of Systems: Ophthalmic: negative for eye pain, loss of vision or double vision Cardiovascular: negative for chest pain Respiratory: negative for SOB or persistent cough Gastrointestinal: negative for abdominal pain Genitourinary: negative for dysuria or gross hematuria MSK: negative for foot lesions Neurologic: negative for weakness or gait disturbance  Objective  Vitals: BP (!) 78/42   Pulse 68   Temp 98.4 F (36.9 C)   Ht 5' 7.5" (1.715 m)   Wt 141 lb (64 kg)   SpO2 97%   BMI 21.76 kg/m  General: well appearing, no acute distress , thin Psych:  Alert and oriented, normal mood and affect HEENT:  Normocephalic, atraumatic, moist mucous membranes, supple neck  Cardiovascular:  Nl S1 and S2, RRR without murmur, gallop or  rub. no edema Respiratory:  Good breath sounds bilaterally, CTAB with normal effort, no rales Foot exam: no erythema, pallor, or cyanosis visible nl proprioception and sensation to monofilament testing bilaterally, +2 distal pulses bilaterally    Diabetic education: ongoing education regarding chronic disease management for diabetes was given today. We continue to reinforce the ABC's of diabetic management: A1c (<7 or 8 dependent upon patient), tight blood pressure control, and cholesterol management with goal LDL < 100 minimally. We discuss diet strategies, exercise recommendations, medication options and possible side effects. At each visit, we review recommended immunizations and preventive care recommendations for diabetics and stress that good diabetic control can prevent other problems. See below for this patient's data.   Commons side effects, risks, benefits, and alternatives  for medications and treatment plan prescribed today were discussed, and the patient expressed understanding of the given instructions. Patient is instructed to call or message via MyChart if he/she has any questions or concerns regarding our treatment plan. No barriers to understanding were identified. We discussed Red Flag symptoms and signs in detail. Patient expressed understanding regarding what to do in case of urgent or emergency type symptoms.  Medication list was reconciled, printed and provided to the patient in AVS. Patient instructions and summary information was reviewed with the patient as documented in the AVS. This note was prepared with assistance of Dragon voice recognition software. Occasional wrong-word or sound-a-like substitutions may have occurred due to the inherent limitations of voice recognition software  This visit occurred during the SARS-CoV-2 public health emergency.  Safety protocols were in place, including screening questions prior to the visit, additional usage of staff PPE, and extensive  cleaning of exam room while observing appropriate contact time as indicated for disinfecting solutions.

## 2022-01-14 NOTE — Patient Instructions (Signed)
Please return in 3 months for diabetes follow up   I will release your lab results to you on your MyChart account with further instructions. You may see the results before I do, but when I review them I will send you a message with my report or have my assistant call you if things need to be discussed. Please reply to my message with any questions. Thank you!   Be cautious due to your low blood pressure and follow up with cardiology today to get his recommendations on blood pressure medication adjustments.   If you have any questions or concerns, please don't hesitate to send me a message via MyChart or call the office at [phone]. Thank you for visiting with [place] today! It's our pleasure caring for you.

## 2022-01-18 ENCOUNTER — Telehealth: Payer: Self-pay | Admitting: *Deleted

## 2022-01-18 DIAGNOSIS — R7989 Other specified abnormal findings of blood chemistry: Secondary | ICD-10-CM

## 2022-01-18 NOTE — Telephone Encounter (Signed)
Called pt and informed of repeat BMET need.   Pt canNOT stop by Wednesday for this, but he will stop by the following day, Thursday, for follow up lab work.  Aware he does NOT need to be fasting. Pt agreeable to plan.

## 2022-01-18 NOTE — Telephone Encounter (Signed)
-----   Message from Will Meredith Leeds, MD sent at 01/18/2022  9:33 AM EDT ----- Patient needs repeat BMP on 7/12. ----- Message ----- From: Jolaine Artist, MD Sent: 01/17/2022  10:09 PM EDT To: Constance Haw, MD; Stanton Kidney, RN; #  Thanks Will.   Please let me know as well and we can assist. - dan  ----- Message ----- From: Constance Haw, MD Sent: 01/15/2022   5:48 PM EDT To: Jolaine Artist, MD; Stanton Kidney, RN; #  Called patient about his significantly elevated creatinine.  He has not been taking Aldactone, torsemide, Entresto.  I told him to continue to hold these medications and we will recheck his BMP next week.  He may need to follow-up with heart failure cardiology. ----- Message ----- From: Leamon Arnt, MD Sent: 01/14/2022   4:33 PM EDT To: Constance Haw, MD; #  Please call patient: thyroid level is much improved but need to increase dose as we discussed. Please order levothyroxine 5mg daily #90 3rf; stop the 543m dose. We will recheck again in 3 months.  Other labs suggest he is dehydrated as kidney function is worse. I will forward results to cardiologist who is adjusting his other medications.

## 2022-01-19 ENCOUNTER — Ambulatory Visit (INDEPENDENT_AMBULATORY_CARE_PROVIDER_SITE_OTHER): Payer: BC Managed Care – PPO | Admitting: Psychiatry

## 2022-01-19 DIAGNOSIS — F411 Generalized anxiety disorder: Secondary | ICD-10-CM | POA: Diagnosis not present

## 2022-01-19 NOTE — Progress Notes (Signed)
[facility] Counselor/Therapist Progress Note  Patient ID: Shane [name], MRN: [id number],    Date: 01/19/2022  Time Spent: 50 minutes   Treatment Type: Individual Therapy  Reported Symptoms:  anxiety  Mental Status Exam:  Appearance:   Neat     Behavior:  Appropriate, Sharing, and Motivated  Motor:  Normal  Speech/Language:   Clear and Coherent  Affect:  anxious  Mood:  anxious  Thought process:  goal directed  Thought content:    WNL  Sensory/Perceptual disturbances:    WNL  Orientation:  oriented to person, place, time/date, situation, day of week, month of year, year, and stated date of January 19, 2022  Attention:  Fair/Good  Concentration:  Good and Fair  Memory:  WNL  Fund of knowledge:   Good  Insight:    Fair  Judgment:   Good  Impulse Control:  Good   Risk Assessment: Danger to Self:  No Self-injurious Behavior: No Danger to Others: No Duty to Warn:no Physical Aggression / Violence:No  Access to Firearms a concern: No  Gang Involvement:No   Subjective: Patient in today reporting anxiety "but not depression". Trying to stay on task more and "less scatter-brained." Anxiety about all the physical issues he has gone through recently, and needed to process this more today in session.  Is starting to feel a little less stressed about his health but "it is a work in progress." Is back at work and doing ok . Work is chaotic and I'm not sure I'm handling it the best. Discussed his tendency to be self-critical and wanting to decrease it. Looking forward to upcoming vacation. Is feeling some anxiety about health but is definitely more confident than previously.  Is back at work and feels he is doing good there, but does occasionally doubt himself and some things, and adds that he is just adjusting to his more normal routine.  He is good about taking breaks as needed, positive self talk, using his deep breathing exercises that can help with anxiety.  Is hoping to retire  within 2 to 3 years.  Looking forward to an upcoming trip with his wife for weekend and also a visit from a "half sister and her husband that I did not realize I had until recently".  Not obsessing as much over the future and looks and seems better physically.  Interventions: Cognitive Behavioral Therapy  Plan:   Patient not signing tx updates on computer screen due to COVID  Treatment Goals: Goals remain on tx plan while patient works on strategies to meet his goals. Progress is documented each session in the "Progress" section on Plan. Long Term Goal: Enhance the ability to handle effectively his depression and the full variety of life's anxieties.  Short Term Goal:  Verbalize an understanding of how thoughts, physical feelings, and behavioral actions contribute to anxiety and anger and its treatment. Strategy:  Patient will work to recognize and interrupt triggers to his anxious or depressive thoughts  so as to replace with positive, more reality-based thoughts, which lead to more positive actions/behaviors  Diagnosis:   ICD-10-CM   1. Generalized anxiety disorder  F41.1      Plan:  Patient today showing good motivation and participation as he focused on his anxiety related mostly to recent health concerns and work.  Feels relationships within family and marriage are going well right now.  Was able to process several concerns about health, progress made, and readjusting to increased hours on the  job following his recent health concerns.  States that his doctors do feel he is doing better and stabilizing, and is moving forward.  Able to plan a weekend away with his wife soon and will also be meeting for the first time a "half sister" that he did not know he had until recently.  Is looking better physically and not as stressed.  Trying to manage the stress he does feel in more healthy ways which we also discussed today in session.  His marital relationship experiencing improvements is  significant for him and his wife as they work to move forward in a more supportive and positive direction.  Does feel that he needs to continue therapy to continue working with strategies on his anxiety, setting limits that are appropriate, and trying to move forward with healthy relationships.  States that he realizes his improvements and wants to build on them and maintain them as he is working with some new behaviors and coping and sees them as positive. Encouraged patient in his practice of positive behaviors as discussed in session including: Staying in the present and focusing on what he can control versus cannot, using deep breathing exercises to help with anxiety, practice more active listening skills with wife which can help or 80: Potential conflict, consistently practice positive self talk, be aware of the positives each day, remain on medications as prescribed, exercise in accordance with his doctor's recommendation, letting go of things from the past that can hold him back at this point, remain in contact with supportive people, and recognize the strength he shows when working with goal directed behaviors to move forward in a direction that supports his improved emotional health and overall outlook.  Review and progress/challenges noted with patient.  Next appointment within 3 to 4 weeks.  This record has been created using [employer].  Chart creation errors have been sought, but may not always have been located and corrected.  Such creation errors do not reflect on the standard of medical care provided.   [name], LCSW

## 2022-01-21 ENCOUNTER — Other Ambulatory Visit: Payer: BC Managed Care – PPO

## 2022-01-21 DIAGNOSIS — R7989 Other specified abnormal findings of blood chemistry: Secondary | ICD-10-CM

## 2022-01-22 LAB — BASIC METABOLIC PANEL
BUN/Creatinine Ratio: 23 — ABNORMAL HIGH (ref 9–20)
BUN: 26 mg/dL — ABNORMAL HIGH (ref 6–24)
CO2: 26 mmol/L (ref 20–29)
Calcium: 10.1 mg/dL (ref 8.7–10.2)
Chloride: 101 mmol/L (ref 96–106)
Creatinine, Ser: 1.12 mg/dL (ref 0.76–1.27)
Glucose: 112 mg/dL — ABNORMAL HIGH (ref 70–99)
Potassium: 4.4 mmol/L (ref 3.5–5.2)
Sodium: 142 mmol/L (ref 134–144)
eGFR: 77 mL/min/{1.73_m2} (ref >59)

## 2022-01-22 NOTE — Progress Notes (Signed)
Improved renal function with stopping diuretics and entresto. To HF clinic.

## 2022-01-26 ENCOUNTER — Encounter (INDEPENDENT_AMBULATORY_CARE_PROVIDER_SITE_OTHER): Payer: BC Managed Care – PPO | Admitting: Ophthalmology

## 2022-01-26 DIAGNOSIS — H353221 Exudative age-related macular degeneration, left eye, with active choroidal neovascularization: Secondary | ICD-10-CM

## 2022-01-26 DIAGNOSIS — I10 Essential (primary) hypertension: Secondary | ICD-10-CM

## 2022-01-26 DIAGNOSIS — H35033 Hypertensive retinopathy, bilateral: Secondary | ICD-10-CM

## 2022-01-26 DIAGNOSIS — B399 Histoplasmosis, unspecified: Secondary | ICD-10-CM | POA: Diagnosis not present

## 2022-01-26 DIAGNOSIS — H348322 Tributary (branch) retinal vein occlusion, left eye, stable: Secondary | ICD-10-CM

## 2022-01-26 DIAGNOSIS — H43813 Vitreous degeneration, bilateral: Secondary | ICD-10-CM

## 2022-01-26 DIAGNOSIS — H32 Chorioretinal disorders in diseases classified elsewhere: Secondary | ICD-10-CM

## 2022-01-31 ENCOUNTER — Other Ambulatory Visit: Payer: Self-pay | Admitting: Family Medicine

## 2022-02-01 ENCOUNTER — Ambulatory Visit: Payer: BC Managed Care – PPO | Admitting: Cardiology

## 2022-02-01 ENCOUNTER — Encounter: Payer: Self-pay | Admitting: Cardiology

## 2022-02-01 VITALS — BP 124/76 | HR 52 | Ht 67.0 in | Wt 148.8 lb

## 2022-02-01 DIAGNOSIS — I493 Ventricular premature depolarization: Secondary | ICD-10-CM

## 2022-02-01 DIAGNOSIS — I5022 Chronic systolic (congestive) heart failure: Secondary | ICD-10-CM | POA: Diagnosis not present

## 2022-02-01 NOTE — Progress Notes (Signed)
Electrophysiology Office Note   Date:  02/01/2022   ID:  Shane [name], DOB Jan 13, 1966, MRN [id number]  PCP:  Leamon Arnt, MD  Cardiologist:  Gwenlyn Found Primary Electrophysiologist:  Kristalynn Coddington Meredith Leeds, MD    Chief Complaint: PVC   History of Present Illness: Shane [name] is a 56 y.o. male who is being seen today for the evaluation of PVC at the request of Leamon Arnt, MD. Presenting today for electrophysiology evaluation.  He has a history seen for coronary artery disease, Hodgkin's disease, hyperlipidemia, macular degeneration, PVCs, systolic heart failure.  He had an anterior wall MI and LAD stent placed in 2011.  He had recurrent symptoms and is status post CABG with LIMA to the LAD, vein to the OM and RCA.  Ejection fraction was 30 to 35% but improved to 50 to 55%.  He began having PVCs with a 20% burden.  His ejection fraction went down.  He is now status post PVC ablation 12/23/2021 inferior to the right left coronary commissure.  Today, denies symptoms of palpitations, chest pain, shortness of breath, orthopnea, PND, lower extremity edema, claudication, dizziness, presyncope, syncope, bleeding, or neurologic sequela. The patient is tolerating medications without difficulties.  Since his ablation he has done well.  He has no chest pain or shortness of breath.  He is back to doing his daily activities without restriction.  His creatinine became elevated and his Aldactone, Entresto, torsemide were all stopped.  His creatinine has improved.  His blood pressure has gotten much better since stopping these medications with an improvement in his energy.   Past Medical History:  Diagnosis Date   CAD (coronary artery disease), s/p stent 2011 and CABG 2015 [facility])    Hodgkin disease [facility]) 1997   Hyperlipidemia    Macular degeneration    PVC's (premature ventricular contractions)    Past Surgical History:  Procedure Laterality Date   CARDIAC CATHETERIZATION   08/2009   PCI -LAD 2.52mx18mm Xience   CORONARY ARTERY BYPASS GRAFT  02/2014   PVC ABLATION N/A 12/23/2021   Procedure: PVC ABLATION;  Surgeon: CConstance Haw MD;  [facility];  Service: Cardiovascular;  Laterality: N/A;   RIGHT/LEFT HEART CATH AND CORONARY ANGIOGRAPHY N/A 08/03/2021   Procedure: RIGHT/LEFT HEART CATH AND CORONARY ANGIOGRAPHY;  Surgeon: BLorretta Harp MD;  [facility];  Service: Cardiovascular;  Laterality: N/A;     Current Outpatient Medications  Medication Sig Dispense Refill   acetaminophen (TYLENOL) 500 MG tablet Take 1,000 mg by mouth every 6 (six) hours as needed for headache (pain).     ARIPiprazole (ABILIFY) 5 MG tablet Take 1 tablet (5 mg total) by mouth daily. 90 tablet 1   aspirin 81 MG tablet Take 81 mg by mouth daily.     Bevacizumab (AVASTIN IV) Inject 1 Dose into the eye See admin instructions. Every six to eight weeks in left eye     carvedilol (COREG) 3.125 MG tablet Take 1 tablet by mouth twice daily. 180 tablet 3   ciprofloxacin (CILOXAN) 0.3 % ophthalmic solution Place 1 drop into the left eye 4 (four) times daily. After Eye injection every 6-8 weeks for two days     dapagliflozin propanediol (FARXIGA) 10 MG TABS tablet Take 1 tablet (10 mg total) by mouth daily before breakfast. 30 tablet 6   escitalopram (LEXAPRO) 20 MG tablet Take 1 tablet (20 mg total) by mouth daily. 90 tablet 1   levothyroxine (  SYNTHROID) 50 MCG tablet TAKE 1 TABLET(50 MCG) BY MOUTH DAILY 90 tablet 0   LORazepam (ATIVAN) 0.5 MG tablet Take 1 tablet (0.5 mg total) by mouth 2 (two) times daily as needed for anxiety. 60 tablet 2   metFORMIN (GLUCOPHAGE-XR) 500 MG 24 hr tablet Take 2 tablets (1,000 mg total) by mouth daily with breakfast. 180 tablet 3   Multiple Vitamins-Minerals (PRESERVISION AREDS 2 PO) Take 2 tablets by mouth in the morning.     nitroGLYCERIN (NITROSTAT) 0.4 MG SL tablet Place 1 tablet (0.4 mg total) under the tongue every 5  (five) minutes as needed for chest pain. 25 tablet 6   rosuvastatin (CRESTOR) 20 MG tablet Take 1 tablet by mouth daily. **Please call to schedule office visit prior to further refills** 90 tablet 0   sacubitril-valsartan (ENTRESTO) 24-26 MG Take 1 tablet by mouth 2 (two) times daily. (Patient taking differently: Take 1 tablet by mouth 2 (two) times daily. As needed) 60 tablet 3   spironolactone (ALDACTONE) 25 MG tablet Take 0.5 tablets (12.5 mg total) by mouth at bedtime. (Patient taking differently: Take 12.5 mg by mouth at bedtime. As needed) 15 tablet 11   torsemide (DEMADEX) 20 MG tablet Take 1 tablet (20 mg total) by mouth daily. For weight gain and/or swelling 30 tablet 5   ursodiol (ACTIGALL) 500 MG tablet Take 500 mg by mouth 2 (two) times daily.     No current facility-administered medications for this visit.    Allergies:   Patient has no known allergies.   Social History:  The patient  reports that he has never smoked. He has quit using smokeless tobacco. He reports current alcohol use of about 3.0 standard drinks of alcohol per week. He reports that he does not use drugs.   Family History:  The patient's family history includes COPD in his father and mother; Cancer in his maternal grandmother and mother; Heart Problems in his brother and brother; Heart attack in his father; Heart disease in his father; Stroke in his mother.    ROS:  Please see the history of present illness.   Otherwise, review of systems is positive for none.   All other systems are reviewed and negative.   PHYSICAL EXAM: VS:  BP 124/76   Pulse (!) 52   Ht '5\' 7"'$  (1.702 m)   Wt 148 lb 12.8 oz (67.5 kg)   SpO2 98%   BMI 23.31 kg/m  , BMI Body mass index is 23.31 kg/m. GEN: Well nourished, well developed, in no acute distress  HEENT: normal  Neck: no JVD, carotid bruits, or masses Cardiac: RRR; no murmurs, rubs, or gallops,no edema  Respiratory:  clear to auscultation bilaterally, normal work of  breathing GI: soft, nontender, nondistended, + BS MS: no deformity or atrophy  Skin: warm and dry Neuro:  Strength and sensation are intact Psych: euthymic mood, full affect  EKG:  EKG is ordered today. Personal review of the ekg ordered shows sinus rhythm, rate 52, first-degree AV block  Recent Labs: 10/08/2021: ALT 26 10/27/2021: Magnesium 2.6 11/09/2021: B Natriuretic Peptide 1,268.4 12/21/2021: Hemoglobin 13.9; Platelets 309 01/14/2022: TSH 11.05 01/21/2022: BUN 26; Creatinine, Ser 1.12; Potassium 4.4; Sodium 142    Lipid Panel     Component Value Date/Time   CHOL 146 10/08/2021 1043   CHOL 109 10/19/2019 1146   TRIG 67.0 10/08/2021 1043   HDL 67.70 10/08/2021 1043   HDL 44 10/19/2019 1146   CHOLHDL 2 10/08/2021 1043   VLDL 13.4  10/08/2021 1043   LDLCALC 65 10/08/2021 1043   LDLCALC 50 10/19/2019 1146     Wt Readings from Last 3 Encounters:  02/01/22 148 lb 12.8 oz (67.5 kg)  01/14/22 141 lb (64 kg)  12/25/21 143 lb 3.2 oz (65 kg)      Other studies Reviewed: Additional studies/ records that were reviewed today include: TTE 11/10/21 Review of the above records today demonstrates:   1. Left ventricular ejection fraction, by estimation, is 30 to 35%. The  left ventricle has moderately decreased function. The left ventricle  demonstrates regional wall motion abnormalities (see scoring  diagram/findings for description). Left ventricular   diastolic parameters are consistent with Grade II diastolic dysfunction  (pseudonormalization). Elevated left atrial pressure. There is akinesis of  the left ventricular, apical anteroseptal wall, anterior wall and apical  segment. There is severe  hypokinesis of the left ventricular, basal-mid inferior wall and  inferolateral wall.   2. Right ventricular systolic function is mildly reduced. The right  ventricular size is mildly enlarged. There is normal pulmonary artery  systolic pressure.   3. Left atrial size was mildly dilated.    4. Right atrial size was mildly dilated.   5. The mitral valve is normal in structure. Mild to moderate mitral valve  regurgitation.   6. The aortic valve is tricuspid. Aortic valve regurgitation is not  visualized. No aortic stenosis is present.   7. The inferior vena cava is dilated in size with >50% respiratory  variability, suggesting right atrial pressure of 8 mmHg.   Cardiac monitor 12/18/2021 personally reviewed Sinus rhythm, 20% PVCs  ASSESSMENT AND PLAN:  1.  PVCs: Initially had a 13% burden that went up to 20%.  Is now status post ablation 1823.  Ablation was performed inferior to the right/left coronary cusp commissure.  He continues to not have PVCs.  He is feeling well with improved energy.  2.  Coronary artery disease: Status post CABG.  Occluded RCA.  Left to right collaterals.  No current chest pain.  3.  Chronic systolic heart failure: Ejection fraction severely reduced.  Currently on Farxiga 10 mg, carvedilol 3.125 mg twice daily.  He was taken off of his Entresto, Aldactone, torsemide as his creatinine was significantly elevated.  His blood pressure has improved being off of these medications.  He has a repeat echo scheduled for September.  We Karlin Binion continue to hold off on his heart failure medications until he sees the heart failure clinic back in 1 month.  Current medicines are reviewed at length with the patient today.   The patient does not have concerns regarding his medicines.  The following changes were made today: None  Labs/ tests ordered today include:  Orders Placed This Encounter  Procedures   EKG 12-Lead     Disposition:   FU with Derrica Sieg 4 months  Signed, Dinara Lupu Meredith Leeds, MD  02/01/2022 11:14 AM     [facility] [address] [place] [address] [phone] (office) [phone] (fax)

## 2022-02-01 NOTE — Patient Instructions (Signed)
Medication Instructions:  Your physician recommends that you continue on your current medications as directed. Please refer to the Current Medication list given to you today.  *If you need a refill on your cardiac medications before your next appointment, please call your pharmacy*   Lab Work: None ordered  Testing/Procedures: None ordered   Follow-Up: At [facility], you and your health needs are our priority.  As part of our continuing mission to provide you with exceptional heart care, we have created designated Provider Care Teams.  These Care Teams include your primary Cardiologist (physician) and Advanced Practice Providers (APPs -  Physician Assistants and Nurse Practitioners) who all work together to provide you with the care you need, when you need it.   Your next appointment:   November     The format for your next appointment:   In Person  Provider:   Allegra Lai, MD    Thank you for choosing [facility]!!   Trinidad Curet, RN [phone]  Other Instructions   Important Information About Sugar

## 2022-02-07 ENCOUNTER — Other Ambulatory Visit: Payer: Self-pay | Admitting: Family Medicine

## 2022-02-13 DIAGNOSIS — G4733 Obstructive sleep apnea (adult) (pediatric): Secondary | ICD-10-CM | POA: Diagnosis not present

## 2022-02-27 ENCOUNTER — Other Ambulatory Visit: Payer: Self-pay | Admitting: Cardiovascular Disease

## 2022-02-27 DIAGNOSIS — E785 Hyperlipidemia, unspecified: Secondary | ICD-10-CM

## 2022-03-02 ENCOUNTER — Ambulatory Visit (HOSPITAL_COMMUNITY)
Admission: RE | Admit: 2022-03-02 | Discharge: 2022-03-02 | Disposition: A | Discharge status: Home / Self Care | Payer: BC Managed Care – PPO | Source: Ambulatory Visit | Attending: Internal Medicine | Admitting: Internal Medicine

## 2022-03-02 ENCOUNTER — Encounter (HOSPITAL_COMMUNITY): Payer: Self-pay | Admitting: Internal Medicine

## 2022-03-02 ENCOUNTER — Other Ambulatory Visit (HOSPITAL_COMMUNITY): Payer: Self-pay | Admitting: Internal Medicine

## 2022-03-02 VITALS — BP 124/70 | HR 59 | Wt 156.6 lb

## 2022-03-02 DIAGNOSIS — I2510 Atherosclerotic heart disease of native coronary artery without angina pectoris: Secondary | ICD-10-CM | POA: Diagnosis not present

## 2022-03-02 DIAGNOSIS — Z7984 Long term (current) use of oral hypoglycemic drugs: Secondary | ICD-10-CM | POA: Diagnosis not present

## 2022-03-02 DIAGNOSIS — Z951 Presence of aortocoronary bypass graft: Secondary | ICD-10-CM

## 2022-03-02 DIAGNOSIS — I3139 Other pericardial effusion (noninflammatory): Secondary | ICD-10-CM | POA: Insufficient documentation

## 2022-03-02 DIAGNOSIS — Z9221 Personal history of antineoplastic chemotherapy: Secondary | ICD-10-CM | POA: Insufficient documentation

## 2022-03-02 DIAGNOSIS — I255 Ischemic cardiomyopathy: Secondary | ICD-10-CM | POA: Insufficient documentation

## 2022-03-02 DIAGNOSIS — Z01818 Encounter for other preprocedural examination: Secondary | ICD-10-CM | POA: Insufficient documentation

## 2022-03-02 DIAGNOSIS — I110 Hypertensive heart disease with heart failure: Secondary | ICD-10-CM | POA: Insufficient documentation

## 2022-03-02 DIAGNOSIS — I5022 Chronic systolic (congestive) heart failure: Secondary | ICD-10-CM | POA: Diagnosis not present

## 2022-03-02 DIAGNOSIS — Z8572 Personal history of non-Hodgkin lymphomas: Secondary | ICD-10-CM | POA: Diagnosis not present

## 2022-03-02 DIAGNOSIS — Z955 Presence of coronary angioplasty implant and graft: Secondary | ICD-10-CM | POA: Insufficient documentation

## 2022-03-02 DIAGNOSIS — E119 Type 2 diabetes mellitus without complications: Secondary | ICD-10-CM | POA: Diagnosis not present

## 2022-03-02 DIAGNOSIS — G4733 Obstructive sleep apnea (adult) (pediatric): Secondary | ICD-10-CM | POA: Insufficient documentation

## 2022-03-02 DIAGNOSIS — I493 Ventricular premature depolarization: Secondary | ICD-10-CM

## 2022-03-02 DIAGNOSIS — I959 Hypotension, unspecified: Secondary | ICD-10-CM | POA: Insufficient documentation

## 2022-03-02 DIAGNOSIS — I25810 Atherosclerosis of coronary artery bypass graft(s) without angina pectoris: Secondary | ICD-10-CM | POA: Diagnosis not present

## 2022-03-02 DIAGNOSIS — Z79899 Other long term (current) drug therapy: Secondary | ICD-10-CM | POA: Insufficient documentation

## 2022-03-02 DIAGNOSIS — L905 Scar conditions and fibrosis of skin: Secondary | ICD-10-CM | POA: Diagnosis not present

## 2022-03-02 DIAGNOSIS — I5020 Unspecified systolic (congestive) heart failure: Secondary | ICD-10-CM

## 2022-03-02 DIAGNOSIS — Z923 Personal history of irradiation: Secondary | ICD-10-CM | POA: Insufficient documentation

## 2022-03-02 DIAGNOSIS — I252 Old myocardial infarction: Secondary | ICD-10-CM | POA: Diagnosis not present

## 2022-03-02 LAB — BASIC METABOLIC PANEL
Anion gap: 6 (ref 5–15)
BUN: 21 mg/dL — ABNORMAL HIGH (ref 6–20)
CO2: 26 mmol/L (ref 22–32)
Calcium: 9.3 mg/dL (ref 8.9–10.3)
Chloride: 108 mmol/L (ref 98–111)
Creatinine, Ser: 1.05 mg/dL (ref 0.61–1.24)
GFR, Estimated: >60 mL/min (ref >60)
Glucose, Bld: 113 mg/dL — ABNORMAL HIGH (ref 70–99)
Potassium: 4.9 mmol/L (ref 3.5–5.1)
Sodium: 140 mmol/L (ref 135–145)

## 2022-03-02 LAB — BRAIN NATRIURETIC PEPTIDE: B Natriuretic Peptide: 852.2 pg/mL — ABNORMAL HIGH (ref 0.0–100.0)

## 2022-03-02 MED ORDER — SPIRONOLACTONE 25 MG PO TABS: 12.5000 mg | ORAL_TABLET | Freq: Every day | ORAL | 6 refills | Status: DC

## 2022-03-02 NOTE — Progress Notes (Signed)
ADVANCED HF CLINIC PROGRESS NOTE  Primary Care: Leamon Arnt, MD General Cardiologist: Dr. Gwenlyn Found HF Cardiologist: Dr. Haroldine Laws   Reason for Visit: F/u chronic systolic heart failure  HPI: Shane [name] is a 56 y.o. 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   Echo at time of CABG showed moderately reduced LVEF, 30-35%. EF improved post revascularization and had normalized, back to 50-55%, on repeat echo in 2016. In 2017, he underwent cardiac w/u for decrease exercise tolerance. Echo w/ normal LVEF, 50-55%, no significant valvular diease.    Echo 1/23 EF back down to 30-35%, RV mildly reduced, mild-mod MR. Mod TR.    R/LHC on 1/23 occluded native RCA and RCA vein graft with left-to-right collaterals. The remainder of his grafts were patent.  All his native arteries were occluded. RHC showed elevated right sided filling pressures and marginal CO (see hemodynamics below). He was direct admitted for IV diuretics and AHF consultation. He was also observed to have frequent PVCs ~25/min and previous records showed where prior Zio monitor had also showed high burden PVCs ~13%. He was placed on mexiletine for suppression but c/w frequent PVCs, was later switched to amio 200 mg bid. cMRI EF 29% c/w ischemic CM w/ coronary disease pattern LGE update/ scar. Also noted to have large posterior pericardial effusion w/o tamponade as well as moderate TR.   He had repeat Zio 2/23 while on amio + mexiletine. This showed reduction in PVC burden from 13 to 8.8%. He was subsequently referred to EP for PVC ablation. Seen by Dr. Curt Bears. Given concern for side effects w/ long term use of amio + pt preference, amiodarone was discontinued and mexiletine was increased to 300 mg bid. Dr. Curt Bears recommended repeat Zio to re quantify burden on monotherapy. Zio 6/23 19.1% PVCs. Underwent  PVC ablation 6/23  Following with Dr. Claiborne Billings. Now on Bipap.   Dr. Claiborne Billings following for OSA. AHI 18.1 started on bipap  Echo 11/10/21 EF 30-35%  Here with his with wife. Denies CP or SOB. Has not returned to exercise yet. Wearing Bipap regularly. Doesn't feel tired but falls asleep as soon as he sits down. Wife says it seems he is sleeping better. No edema, orthopnea or PND. No palpitations. Was having low BP and developed AKI with Scr 2.45. Stopped Entresto and spiro. Now taking spiro occasionally. SBP 120s     Cardiac Studies  - Echo (1/23): EF 30-35%, moderate LV dysfunction, global LV HK, RV mildly reduced, RVSP 30.7 mmHg, mild to mod MR, moderate TR   - R/LHC 08/03/21:  occluded RCA and RCA vein graft with left-to-right collaterals.  The remainder of his grafts are patent.  All his native arteries were occluded.   Hemodynamics: 1: Right atrial pressure-17/17 2: Right ventricular pressure-51/23, mean 27 3: Pulmonary artery pressure-57/9, mean 35 4: Pulmonary wedge pressure-A-wave 28, V wave 24, mean 17 5: Cardiac output-3.7 L/min with an index of 2 L/min/m by Fick 6: LVEDP-24   - Cardiac MRI 08/04/21: Ischemic scar pattern.  LVEF 29% RVEF 41% Mod TR Large posterior pericardial effusion w/o tamponade   Zio 2/23 - PVC burden 8.8%      Past Medical History:  Diagnosis Date   CAD (coronary artery disease), s/p stent 2011 and CABG 2015 [facility])    Hodgkin disease [facility]) 1997   Hyperlipidemia  Macular degeneration    PVC's (premature ventricular contractions)    Current Outpatient Medications  Medication Sig Dispense Refill   acetaminophen (TYLENOL) 500 MG tablet Take 1,000 mg by mouth every 6 (six) hours as needed for headache (pain).     ARIPiprazole (ABILIFY) 5 MG tablet Take 1 tablet (5 mg total) by mouth daily. 90 tablet 1   aspirin 81 MG tablet Take 81 mg by mouth daily.     Bevacizumab (AVASTIN IV) Inject 1 Dose into the eye See admin instructions. Every  six to eight weeks in left eye     carvedilol (COREG) 3.125 MG tablet Take 1 tablet by mouth twice daily. 180 tablet 3   ciprofloxacin (CILOXAN) 0.3 % ophthalmic solution Place 1 drop into the left eye 4 (four) times daily. After Eye injection every 6-8 weeks for two days     dapagliflozin propanediol (FARXIGA) 10 MG TABS tablet Take 1 tablet (10 mg total) by mouth daily before breakfast. 30 tablet 6   escitalopram (LEXAPRO) 20 MG tablet Take 1 tablet (20 mg total) by mouth daily. 90 tablet 1   levothyroxine (SYNTHROID) 50 MCG tablet TAKE 1 TABLET(50 MCG) BY MOUTH DAILY 90 tablet 0   LORazepam (ATIVAN) 0.5 MG tablet Take 1 tablet (0.5 mg total) by mouth 2 (two) times daily as needed for anxiety. 60 tablet 2   metFORMIN (GLUCOPHAGE-XR) 500 MG 24 hr tablet TAKE 2 TABLETS(1000 MG) BY MOUTH DAILY WITH BREAKFAST 90 tablet 1   Multiple Vitamins-Minerals (PRESERVISION AREDS 2 PO) Take 2 tablets by mouth in the morning.     nitroGLYCERIN (NITROSTAT) 0.4 MG SL tablet Place 1 tablet (0.4 mg total) under the tongue every 5 (five) minutes as needed for chest pain. 25 tablet 6   rosuvastatin (CRESTOR) 20 MG tablet Take 1 tablet by mouth daily. **Please call to schedule office visit prior to further refills** 90 tablet 3   spironolactone (ALDACTONE) 25 MG tablet Take 25 mg by mouth at bedtime as needed.     torsemide (DEMADEX) 20 MG tablet Take 20 mg by mouth daily as needed.     ursodiol (ACTIGALL) 500 MG tablet Take 500 mg by mouth 2 (two) times daily.     sacubitril-valsartan (ENTRESTO) 24-26 MG Take 1 tablet by mouth 2 (two) times daily. (Patient not taking: Reported on 03/02/2022) 60 tablet 3   No current facility-administered medications for this encounter.   No Known Allergies  Social History   Socioeconomic History   Marital status: Married    Spouse name: Not on file   Number of children: Not on file   Years of education: Not on file   Highest education level: Not on file  Occupational History    Not on file  Tobacco Use   Smoking status: Never   Smokeless tobacco: Former   Tobacco comments:    a long time ago  Vaping Use   Vaping Use: Never used  Substance and Sexual Activity   Alcohol use: Yes    Alcohol/week: 3.0 standard drinks of alcohol    Types: 3 Cans of beer per week    Comment: sometimes 3 beers daily   Drug use: No   Sexual activity: Yes  Other Topics Concern   Not on file  Social History Narrative   Not on file   Social Determinants of Health   Financial Resource Strain: Not on file  Food Insecurity: Not on file  Transportation Needs: Not on file  Physical Activity: Not on  file  Stress: Not on file  Social Connections: Not on file  Intimate Partner Violence: Not on file   Family History  Problem Relation Age of Onset   Stroke Mother    Cancer Mother    COPD Mother    Heart disease Father    COPD Father    Heart attack Father    Heart Problems Brother        heart valve replaced at 45 years old   Heart Problems Brother        MI at age 29; CABG at age 21   Cancer Maternal Grandmother    Colon cancer Neg Hx    BP 124/70   Pulse (!) 59   Wt 71 kg (156 lb 9.6 oz)   SpO2 98%   BMI 24.53 kg/m   Wt Readings from Last 3 Encounters:  03/02/22 71 kg (156 lb 9.6 oz)  02/01/22 67.5 kg (148 lb 12.8 oz)  01/14/22 64 kg (141 lb)   PHYSICAL EXAM: General:  Well appearing. No resp difficulty HEENT: normal Neck: supple. no JVD. Carotids 2+ bilat; no bruits. No lymphadenopathy or thryomegaly appreciated. Cor: PMI nondisplaced. Regular rate & rhythm. No rubs, gallops or murmurs. Lungs: clear Abdomen: soft, nontender, nondistended. No hepatosplenomegaly. No bruits or masses. Good bowel sounds. Extremities: no cyanosis, clubbing, rash, edema Neuro: alert & orientedx3, cranial nerves grossly intact. moves all 4 extremities w/o difficulty. Affect pleasant  ASSESSMENT & PLAN:  1. Chronic Systolic Heart Failure  - prior h/o ischemic CM, that improved  post revascularization. - EF 30-35% at time of CABG in 2015, EF normalized on repeat Echos in 2016 and 2017 (50-55%) - Echo (1/23): EF back down, 30-35%, RV mildly reduced.  - R/LHC (1/23) w/ occluded SVG-RCA but distal RCA filled by L>R collaterals. All other grafts patent. EF out of proportion to degree of CAD. RHC w/ elevated Rt sided filling pressures w/ fairly normal PCWP ~17. CO marginal, CI 2.0.  - Suspect PVC - mediated CM - cMRI (1/23) EF 29% with inferior and inferlolateral scar with some RV LGE with ? sarcoid - Repeat Zio 6/23 19.1% PVC - 6/23 PVC ablation  - Echo 11/10/21 EF 30-35%  - Stable NYHA I-II - Off Entresto due to low BP - Taking spiro 12.5 occasionally when BP in 130s. Will increase to 12.'5mg'$  daily - continue Farxiga 10 mg daily. - Continue Coreg 3.125 mg bid. - Taking torsemide 20 PRN  - It seems like PVCs are suppressed post ablation will repeat Zio to confirm. Also repeat echo if EF still down can consider PET scanning to look for potential sarcoid   2. PVCs - High PVC burden, Holter 5/21 showed 13% burden.  - Suspect contributing to CM. - No coronary ischemia on cath.  - Zio 2/23 w/ burden down to 8.8% on dual AAD therapy w/ amio + mexilitine  - Zio 5/23 19.1% PVC - PVC ablation 6/23 - Repeat Zio to assess PVC burden - Sleep study negative    3. CAD - remote MI 2011, s/p PCI to pLAD - CABG x 4 in 2015 - LHC (1/23): w/ 3/4 patent grafts, occluded SVG- RCA w/ L>R collaterals.  - No s/s angina - Continue ASA + statin - Continue Coreg 6.25 mg bid   4. Pericardial effusion - Large posterior effusion on MRI without tamponade.  - Continue w/ diuretics. - Resolved on echo 5/23   5. Hypertension  - improved   6. OSA - on BIPAP  followed by Dr. Claiborne Billings  - still with daytime fatigue. Check download  7. Type 2DM  - recent A1c 7.2 - on Metformin + Rhodia Albright, MD  10:14 AM

## 2022-03-02 NOTE — Progress Notes (Signed)
Zio patch placed onto patient.  All instructions and information reviewed with patient, they verbalize understanding with no questions. 

## 2022-03-02 NOTE — Patient Instructions (Signed)
Medication Changes:  CHANGE spironolactone to 12.'5mg'$  daily.   Lab Work:  Labs done today, your results will be available in MyChart, we will contact you for abnormal readings.  Testing/Procedures:  Your provider has recommended that  you wear a Zio Patch for 7 days.  This monitor will record your heart rhythm for our review.  IF you have any symptoms while wearing the monitor please press the button.  If you have any issues with the patch or you notice a red or orange light on it please call the company at [phone].  Once you remove the patch please mail it back to the company as soon as possible so we can get the results.  Your physician has requested that you have an echocardiogram. Echocardiography is a painless test that uses sound waves to create images of your heart. It provides your doctor with information about the size and shape of your heart and how well your heart's chambers and valves are working. This procedure takes approximately one hour. There are no restrictions for this procedure.    Special Instructions // Education:  Do the following things EVERYDAY: Weigh yourself in the morning before breakfast. Write it down and keep it in a log. Take your medicines as prescribed Eat low salt foods--Limit salt (sodium) to 2000 mg per day.  Stay as active as you can everyday Limit all fluids for the day to less than 2 liters  Follow-Up in: 4 months    At the [facility], you and your health needs are our priority. We have a designated team specialized in the treatment of Heart Failure. This Care Team includes your primary Heart Failure Specialized Cardiologist (physician), Advanced Practice Providers (APPs- Physician Assistants and Nurse Practitioners), and Pharmacist who all work together to provide you with the care you need, when you need it.   You may see any of the following providers on your designated Care Team at your next follow up:  Dr Glori Bickers Dr Haynes Kerns, NP Lyda Jester, [facility] [place], [place] Audry Riles, PharmD   Please be sure to bring in all your medications bottles to every appointment.   Need to Contact us:  If you have any questions or concerns before your next appointment please send [place] a message through [place] or call our office at [phone].    TO LEAVE A MESSAGE FOR THE NURSE SELECT OPTION 2, PLEASE LEAVE A MESSAGE INCLUDING: YOUR NAME DATE OF BIRTH CALL BACK NUMBER REASON FOR CALL**this is important as we prioritize the call backs  YOU WILL RECEIVE A CALL BACK THE SAME DAY AS LONG AS YOU CALL BEFORE 4:00 PM

## 2022-03-11 ENCOUNTER — Other Ambulatory Visit (HOSPITAL_COMMUNITY): Payer: Self-pay | Admitting: Family Medicine

## 2022-03-12 ENCOUNTER — Ambulatory Visit (HOSPITAL_COMMUNITY)
Admission: RE | Admit: 2022-03-12 | Discharge: 2022-03-12 | Disposition: A | Discharge status: Home / Self Care | Payer: BC Managed Care – PPO | Source: Ambulatory Visit | Attending: Internal Medicine | Admitting: Internal Medicine

## 2022-03-12 DIAGNOSIS — I2510 Atherosclerotic heart disease of native coronary artery without angina pectoris: Secondary | ICD-10-CM | POA: Insufficient documentation

## 2022-03-12 DIAGNOSIS — I5022 Chronic systolic (congestive) heart failure: Secondary | ICD-10-CM

## 2022-03-12 DIAGNOSIS — I340 Nonrheumatic mitral (valve) insufficiency: Secondary | ICD-10-CM | POA: Diagnosis not present

## 2022-03-12 DIAGNOSIS — G4730 Sleep apnea, unspecified: Secondary | ICD-10-CM | POA: Insufficient documentation

## 2022-03-12 DIAGNOSIS — E785 Hyperlipidemia, unspecified: Secondary | ICD-10-CM | POA: Diagnosis not present

## 2022-03-12 DIAGNOSIS — I5020 Unspecified systolic (congestive) heart failure: Secondary | ICD-10-CM

## 2022-03-12 DIAGNOSIS — I509 Heart failure, unspecified: Secondary | ICD-10-CM | POA: Insufficient documentation

## 2022-03-12 DIAGNOSIS — I110 Hypertensive heart disease with heart failure: Secondary | ICD-10-CM | POA: Insufficient documentation

## 2022-03-12 DIAGNOSIS — E119 Type 2 diabetes mellitus without complications: Secondary | ICD-10-CM | POA: Diagnosis not present

## 2022-03-12 LAB — ECHOCARDIOGRAM COMPLETE
AR max vel: 1.84 cm2
AV Area VTI: 1.98 cm2
AV Area mean vel: 1.84 cm2
AV Mean grad: 2 mmHg
AV Peak grad: 4.5 mmHg
Ao pk vel: 1.06 m/s
Area-P 1/2: 4.15 cm2
MV M vel: 5.04 m/s
MV Peak grad: 101.6 mmHg
Radius: 0.3 cm
S' Lateral: 4.1 cm

## 2022-03-12 NOTE — Progress Notes (Signed)
  Echocardiogram 2D Echocardiogram has been performed.  Shane [name] 03/12/2022, 11:52 AM

## 2022-03-16 ENCOUNTER — Encounter: Payer: Self-pay | Admitting: Cardiovascular Disease

## 2022-03-16 ENCOUNTER — Encounter (HOSPITAL_COMMUNITY): Payer: Self-pay | Admitting: Internal Medicine

## 2022-03-22 ENCOUNTER — Telehealth: Payer: Self-pay | Admitting: *Deleted

## 2022-03-22 NOTE — Telephone Encounter (Signed)
BIPAP restart order faxed to [place] via EMR electronic fax per request from [place].

## 2022-03-23 ENCOUNTER — Ambulatory Visit: Payer: BC Managed Care – PPO | Admitting: Psychiatry

## 2022-03-23 DIAGNOSIS — F411 Generalized anxiety disorder: Secondary | ICD-10-CM | POA: Diagnosis not present

## 2022-03-23 NOTE — Progress Notes (Signed)
[facility] Counselor/Therapist Progress Note  Patient ID: Shane [name], MRN: [id number],    Date: 03/23/2022  Time Spent: 55 minutes   Treatment Type: Individual Therapy  Reported Symptoms: anxiety, "no depression"  Mental Status Exam:  Appearance:   Casual and Neat     Behavior:  Appropriate, Sharing, and Motivated  Motor:  Normal  Speech/Language:   Clear and Coherent  Affect:  anxious  Mood:  anxious  Thought process:  goal directed  Thought content:    WNL  Sensory/Perceptual disturbances:    WNL  Orientation:  oriented to person, place, time/date, situation, day of week, month of year, year, and stated date of Sept. 12, 2023  Attention:  Good  Concentration:  Good  Memory:  WNL  Fund of knowledge:   Good  Insight:    Good  Judgment:   Good  Impulse Control:  Good   Risk Assessment: Danger to Self:  No Self-injurious Behavior: No Danger to Others: No Duty to Warn:no Physical Aggression / Violence:No  Access to Firearms a concern: No  Gang Involvement:No   Subjective: Patient today reporting "no depression" and anxiety has increased some recently due to work changes, re-arranges, budgeting, planning etc. "Lot of learning in midst of moving parts." "A lot of me to keep under control." Not quite as "scatter-brained". Did have echocardiogram that wasn't as "strong as I had hoped" but it is still encouraging. Focused in session today on his anxiety and coping strategies to better manage anxiety and frustration, and able to "tune out" distractions at office and modulate his response to stressors and stop them from escalating.  Practiced this in session today as well as interjecting some new steps in his process of backing away from anxious situations.  Continues to feel that his stress is decreasing overall both at work and at home however work does tend to be the more challenging of his stressors.  Tends to be a chaotic environment that is often unpredictable.   After his recent health issues, he is much more in touch with his health status and trying to exercise better self-care.  Hoping to retire within 2 to 3 years.  Refrains from obsessing as much of the future, which also translates into less worrying.  Interventions: Cognitive Behavioral Therapy and Ego-Supportive  Plan:   Patient not signing tx updates on computer screen due to COVID  Treatment Goals: Goals remain on tx plan while patient works on strategies to meet his goals. Progress is documented each session in the "Progress" section on Plan. Long Term Goal: Enhance the ability to handle effectively his depression and the full variety of life's anxieties.  Short Term Goal:  Verbalize an understanding of how thoughts, physical feelings, and behavioral actions contribute to anxiety and anger and its treatment. Strategy:  Patient will work to recognize and interrupt triggers to his anxious or depressive thoughts  so as to replace with positive, more reality-based thoughts, which lead to more positive actions/behaviors  Diagnosis:   ICD-10-CM   1. Generalized anxiety disorder  F41.1      Plan: Patient today showing real good motivation and active participation in session as he worked further on his anxiety mostly related to home and work situations.  Did very well in seeing how anxiety "catches me" and hard for him to shake.  Did equally as well and following through on some goal-directed behaviors as we discussed strategies he can use and not letting anxiety overpower him and  specific steps he can take to be able to manage it without much disruption.  Patient and wife continue to do better as a couple and that is helping him as well.  Is definitely showing improvement and continues to need therapy to further work with goal directed behaviors and continue the progress that is happening.  Outlook is definitely more positive.Encouraged patient in his practice of positive behaviors including: Staying  focused on the present and what he can control versus cannot, using the deep breathing exercises discussed in sessions to help with his anxiety, practice more active listening skills with his wife and others, consistently practice positive self talk, be aware of positives each day, remain on medications as prescribed, exercise per his doctor's recommendations, letting go of things from the past that can hold him back, stay in contact with supportive people, and realize the strength he shows working with goal directed behaviors to move forward in a direction that supports his improved emotional health and overall outlook.  Goal review and progress/challenges noted with patient.  Next appointment within 1 month.  This record has been created using [employer].  Chart creation errors have been sought, but may not always have been located and corrected.  Such creation errors do not reflect on the standard of medical care provided.   [name], LCSW

## 2022-03-30 ENCOUNTER — Other Ambulatory Visit: Payer: Self-pay | Admitting: Adult Health

## 2022-03-30 ENCOUNTER — Encounter (INDEPENDENT_AMBULATORY_CARE_PROVIDER_SITE_OTHER): Payer: BC Managed Care – PPO | Admitting: Ophthalmology

## 2022-03-30 ENCOUNTER — Other Ambulatory Visit: Payer: Self-pay | Admitting: Family Medicine

## 2022-03-30 DIAGNOSIS — F321 Major depressive disorder, single episode, moderate: Secondary | ICD-10-CM

## 2022-03-30 DIAGNOSIS — H35033 Hypertensive retinopathy, bilateral: Secondary | ICD-10-CM

## 2022-03-30 DIAGNOSIS — I10 Essential (primary) hypertension: Secondary | ICD-10-CM | POA: Diagnosis not present

## 2022-03-30 DIAGNOSIS — H353221 Exudative age-related macular degeneration, left eye, with active choroidal neovascularization: Secondary | ICD-10-CM

## 2022-03-30 DIAGNOSIS — H348322 Tributary (branch) retinal vein occlusion, left eye, stable: Secondary | ICD-10-CM

## 2022-03-30 DIAGNOSIS — H32 Chorioretinal disorders in diseases classified elsewhere: Secondary | ICD-10-CM

## 2022-03-30 DIAGNOSIS — F411 Generalized anxiety disorder: Secondary | ICD-10-CM

## 2022-03-30 DIAGNOSIS — B399 Histoplasmosis, unspecified: Secondary | ICD-10-CM

## 2022-03-30 DIAGNOSIS — F39 Unspecified mood [affective] disorder: Secondary | ICD-10-CM

## 2022-03-30 DIAGNOSIS — H43813 Vitreous degeneration, bilateral: Secondary | ICD-10-CM

## 2022-04-13 ENCOUNTER — Ambulatory Visit (INDEPENDENT_AMBULATORY_CARE_PROVIDER_SITE_OTHER): Payer: BC Managed Care – PPO

## 2022-04-13 DIAGNOSIS — Z23 Encounter for immunization: Secondary | ICD-10-CM | POA: Diagnosis not present

## 2022-04-13 NOTE — Progress Notes (Signed)
Shane [name] 56 yr old male presents to office today for 2nd HEP A vaccine per Billey Chang, MD. Administered HAVRIX 1 mL IM left arm. Patient tolerated well.

## 2022-04-16 ENCOUNTER — Encounter: Payer: Self-pay | Admitting: Family Medicine

## 2022-04-16 ENCOUNTER — Ambulatory Visit: Payer: BC Managed Care – PPO | Admitting: Family Medicine

## 2022-04-16 VITALS — BP 130/80 | HR 60 | Temp 98.7°F | Ht 67.0 in | Wt 156.2 lb

## 2022-04-16 DIAGNOSIS — E118 Type 2 diabetes mellitus with unspecified complications: Secondary | ICD-10-CM | POA: Insufficient documentation

## 2022-04-16 DIAGNOSIS — I5020 Unspecified systolic (congestive) heart failure: Secondary | ICD-10-CM

## 2022-04-16 DIAGNOSIS — F339 Major depressive disorder, recurrent, unspecified: Secondary | ICD-10-CM

## 2022-04-16 DIAGNOSIS — Z23 Encounter for immunization: Secondary | ICD-10-CM

## 2022-04-16 DIAGNOSIS — E039 Hypothyroidism, unspecified: Secondary | ICD-10-CM

## 2022-04-16 DIAGNOSIS — E1159 Type 2 diabetes mellitus with other circulatory complications: Secondary | ICD-10-CM

## 2022-04-16 DIAGNOSIS — K743 Primary biliary cirrhosis: Secondary | ICD-10-CM | POA: Diagnosis not present

## 2022-04-16 DIAGNOSIS — N62 Hypertrophy of breast: Secondary | ICD-10-CM

## 2022-04-16 DIAGNOSIS — I152 Hypertension secondary to endocrine disorders: Secondary | ICD-10-CM

## 2022-04-16 LAB — HEPATIC FUNCTION PANEL
ALT: 23 U/L (ref 0–53)
AST: 22 U/L (ref 0–37)
Albumin: 4.9 g/dL (ref 3.5–5.2)
Alkaline Phosphatase: 131 U/L — ABNORMAL HIGH (ref 39–117)
Bilirubin, Direct: 0.3 mg/dL (ref 0.0–0.3)
Total Bilirubin: 1.2 mg/dL (ref 0.2–1.2)
Total Protein: 7.8 g/dL (ref 6.0–8.3)

## 2022-04-16 LAB — TSH: TSH: 3.33 u[IU]/mL (ref 0.35–5.50)

## 2022-04-16 LAB — POCT GLYCOSYLATED HEMOGLOBIN (HGB A1C): Hemoglobin A1C: 5.7 % — AB (ref 4.0–5.6)

## 2022-04-16 NOTE — Patient Instructions (Signed)
Please return in 6 months for your annual complete physical; please come fasting and diabetes follow up.   I will release your lab results to you on your MyChart account with further instructions. You may see the results before I do, but when I review them I will send you a message with my report or have my assistant call you if things need to be discussed. Please reply to my message with any questions. Thank you!   If you have any questions or concerns, please don't hesitate to send me a message via MyChart or call the office at [phone]. Thank you for visiting with [place] today! It's our pleasure caring for you.

## 2022-04-16 NOTE — Progress Notes (Signed)
Subjective  CC:  Chief Complaint  Patient presents with   Diabetes    Pt here for F/U with DM    HPI: Shane [name] is a 56 y.o. male who presents to the office today for follow up of diabetes and problems listed above in the chief complaint.  Diabetes follow up: His diabetic control is reported as Improved. He is on met xr 1000 daily and farxiga 10. Tolerates both well.  He denies exertional CP or SOB or symptomatic hypoglycemia. He denies foot sores or paresthesias. Eligible for flu shot today. Needs eye exam.  HTN and HFredEF: reviewed cards; meds have been adjusted and renal function and bp has improved. Feels well now.  Biliary cholangitis: requests liver panel for his GI specialist. Stable w/o pain on meds Hypothyroidism: we increased his levtx to 75mg 3 months ago and tolerating well. No sxs of overtreatment. Due for recheck. Weight is trending upwards.  Depression is stable on meds. Sees counselor.  Reports right breast ttp; reports has had right breast "mass" for 20 years. But sore over the last few week. No redness or fevers or nipple drainage. He is now on spironolactone.   Wt Readings from Last 3 Encounters:  04/16/22 156 lb 3.2 oz (70.9 kg)  03/02/22 156 lb 9.6 oz (71 kg)  02/01/22 148 lb 12.8 oz (67.5 kg)    BP Readings from Last 3 Encounters:  04/16/22 130/80  03/02/22 124/70  02/01/22 124/76    Assessment  1. Controlled type 2 diabetes mellitus with complication, without long-term current use of insulin ([facility]   2. Hypertension associated with type 2 diabetes mellitus ([facility]   3. Acquired hypothyroidism   4. Need for immunization against influenza   5. Major depression, recurrent, chronic ([facility]   6. Primary biliary cholangitis (HCC)   7. HFrEF (heart failure with reduced ejection fraction) ([facility]   8. Gynecomastia      Plan  Diabetes is currently very well controlled. Cont met xr1000 daily;farxiga 10 daily; recheck 6 months. If low a1c, will decrease met  dose. Continue diet. Rec eye exam. Flu shot updated HTN controlled CHF now stable. Following with cards. Recheck thyroid function on new dose of meds. Clinically euthyroid Mood is controlled. Recheck liver panel.  Gynecomastia - tenderness may be induce by spironolactone. Benign features. Will monitor and get ultrasound if persists.  Follow up: 6 mo for cpe. Orders Placed This Encounter  Procedures   Flu Vaccine QUAD 617moM (Fluarix, Fluzone & Alfiuria Quad PF)   TSH   Hepatic function panel   POCT HgB A1C   No orders of the defined types were placed in this encounter.     Immunization History  Administered Date(s) Administered   Hepatitis A, Adult 10/08/2021, 04/13/2022   Hepatitis B, adult 11/12/2021   Hepb-cpg 10/08/2021   Influenza,inj,Quad PF,6+ Mos 04/03/2018, 04/03/2019, 04/10/2021, 04/16/2022   Influenza-Unspecified 03/26/2020   PFIZER Comirnaty(Gray Top)Covid-19 Tri-Sucrose Vaccine 10/30/2020   PFIZER(Purple Top)SARS-COV-2 Vaccination 10/22/2019, 11/12/2019   Tdap 11/16/2017   Zoster Recombinat (Shingrix) 10/09/2020, 04/10/2021    Diabetes Related Lab Review: Lab Results  Component Value Date   HGBA1C 5.7 (A) 04/16/2022   HGBA1C 5.6 01/14/2022   HGBA1C 7.2 (H) 10/08/2021    Lab Results  Component Value Date   MICROALBUR 5.2 (H) 01/14/2022   Lab Results  Component Value Date   CREATININE 1.05 03/02/2022   BUN 21 (H) 03/02/2022   NA 140 03/02/2022   K 4.9 03/02/2022   CL  108 03/02/2022   CO2 26 03/02/2022   Lab Results  Component Value Date   CHOL 146 10/08/2021   CHOL 103 10/09/2020   CHOL 109 10/19/2019   Lab Results  Component Value Date   HDL 67.70 10/08/2021   HDL 44.50 10/09/2020   HDL 44 10/19/2019   Lab Results  Component Value Date   LDLCALC 65 10/08/2021   LDLCALC 48 10/09/2020   LDLCALC 50 10/19/2019   Lab Results  Component Value Date   TRIG 67.0 10/08/2021   TRIG 56.0 10/09/2020   TRIG 75 10/19/2019   Lab Results   Component Value Date   CHOLHDL 2 10/08/2021   CHOLHDL 2 10/09/2020   CHOLHDL 2.5 10/19/2019   No results found for: "LDLDIRECT" The ASCVD Risk score (Arnett DK, et al., 2019) failed to calculate for the following reasons:   The patient has a prior MI or stroke diagnosis I have reviewed the [place], Fam and Soc history. Patient Active Problem List   Diagnosis Date Noted   Controlled type 2 diabetes mellitus with complication, without long-term current use of insulin ([place]) 04/16/2022    Priority: High   Hypertension associated with type 2 diabetes mellitus ([place]) 01/14/2022    Priority: High   Primary biliary cholangitis ([place]) 10/08/2021    Priority: High   HFrEF (heart failure with reduced ejection fraction) ([place]) 08/18/2021    Priority: High   CAD (coronary artery disease) 12/03/2019    Priority: High   Essential hypertension 07/24/2019    Priority: High    Essential hypertension    OSA on CPAP 09/07/2018    Priority: High    Obstructive sleep apnea    GAD (generalized anxiety disorder) 05/25/2018    Priority: High   Major depression, recurrent, chronic ([place]) 05/25/2018    Priority: High   S/P CABG (coronary artery bypass graft) 11/16/2017    Priority: High    CAD    Mixed hyperlipidemia 05/24/2014    Priority: High    Hyperlipidemia    Alcohol use disorder, mild, abuse 03/25/2019    Priority: Medium    Acquired hypothyroidism 11/16/2017    Priority: Medium     10/2021    Macular degeneration     Priority: Medium    History of Hodgkin's lymphoma 07/13/1995    Priority: Medium     Treated 30s; cured.    PVC's (premature ventricular contractions) 09/11/2014    Priority: Low    Frequent PVCs    Gynecomastia 04/16/2022   PVC (premature ventricular contraction) 12/23/2021   Mediastinal adenopathy 10/20/2021   LV dysfunction 07/24/2021    LV dysfunction     Social History: Patient  reports that he has never smoked. He has quit using smokeless tobacco. He  reports current alcohol use of about 3.0 standard drinks of alcohol per week. He reports that he does not use drugs.  Review of Systems: Ophthalmic: negative for eye pain, loss of vision or double vision Cardiovascular: negative for chest pain Respiratory: negative for SOB or persistent cough Gastrointestinal: negative for abdominal pain Genitourinary: negative for dysuria or gross hematuria MSK: negative for foot lesions Neurologic: negative for weakness or gait disturbance  Objective  Vitals: BP 130/80   Pulse 60   Temp 98.7 F (37.1 C)   Ht '5\' 7"'  (1.702 m)   Wt 156 lb 3.2 oz (70.9 kg)   SpO2 98%   BMI 24.46 kg/m  General: well appearing, no acute distress  Psych:  Alert and oriented,  normal mood and affect HEENT:  Normocephalic, atraumatic, moist mucous membranes, supple neck  Cardiovascular:  Nl S1 and S2, RRR without murmur, gallop or rub. no edema Respiratory:  Good breath sounds bilaterally, CTAB with normal effort, no rales Right subareolar fullness, mobile. Non-nodular, minimal ttp.   Diabetic education: ongoing education regarding chronic disease management for diabetes was given today. We continue to reinforce the ABC's of diabetic management: A1c (<7 or 8 dependent upon patient), tight blood pressure control, and cholesterol management with goal LDL < 100 minimally. We discuss diet strategies, exercise recommendations, medication options and possible side effects. At each visit, we review recommended immunizations and preventive care recommendations for diabetics and stress that good diabetic control can prevent other problems. See below for this patient's data.   Commons side effects, risks, benefits, and alternatives for medications and treatment plan prescribed today were discussed, and the patient expressed understanding of the given instructions. Patient is instructed to call or message via MyChart if he/she has any questions or concerns regarding our treatment plan.  No barriers to understanding were identified. We discussed Red Flag symptoms and signs in detail. Patient expressed understanding regarding what to do in case of urgent or emergency type symptoms.  Medication list was reconciled, printed and provided to the patient in AVS. Patient instructions and summary information was reviewed with the patient as documented in the AVS. This note was prepared with assistance of Dragon voice recognition software. Occasional wrong-word or sound-a-like substitutions may have occurred due to the inherent limitations of voice recognition software

## 2022-04-30 ENCOUNTER — Ambulatory Visit: Payer: BC Managed Care – PPO | Admitting: Adult Health

## 2022-04-30 ENCOUNTER — Encounter: Payer: Self-pay | Admitting: Adult Health

## 2022-04-30 DIAGNOSIS — F411 Generalized anxiety disorder: Secondary | ICD-10-CM | POA: Diagnosis not present

## 2022-04-30 DIAGNOSIS — F321 Major depressive disorder, single episode, moderate: Secondary | ICD-10-CM

## 2022-04-30 DIAGNOSIS — F39 Unspecified mood [affective] disorder: Secondary | ICD-10-CM

## 2022-04-30 MED ORDER — LORAZEPAM 0.5 MG PO TABS: 0.5000 mg | ORAL_TABLET | Freq: Two times a day (BID) | ORAL | 2 refills | Status: AC | PRN

## 2022-04-30 MED ORDER — ESCITALOPRAM OXALATE 20 MG PO TABS: 20.0000 mg | ORAL_TABLET | Freq: Every day | ORAL | 1 refills | Status: DC

## 2022-04-30 MED ORDER — ARIPIPRAZOLE 5 MG PO TABS: 5.0000 mg | ORAL_TABLET | Freq: Every day | ORAL | 1 refills | Status: DC

## 2022-04-30 NOTE — Progress Notes (Signed)
Shane [name] [id number] [birth year] 56 y.o.  Subjective:   Patient ID:  Shane [name] is a 56 y.o. (DOB 04-21-1966) male.  Chief Complaint: No chief complaint on file.   HPI Shane [name] presents to the office today for follow-up of GAD, MDD, insomnia, mood disorder, panic attacks.   Describes mood today as "ok". Pleasant. Mood symptoms - denies depression, anxiety, and irritability. Denies panic attacks. Mood is consistent. Stating "I'm doing ok". Feels like medications continue to work well. He and wife doing well. Stable interest and motivation. Taking medications as prescribed.  Energy levels stable. Active, has a regular exercise routine - treadmill.  Enjoys some usual interests and activities. Married. Lives with wife. Recently lost cat. Has 2 grown sons. Family in [place].  Appetite adequate. Weight loss - 143 to 153 pounds.  Sleeps well most nights. Averages 7 hours. Has not been using CPAP machine. Focus and concentration stable. Completing tasks. Managing aspects of household. Work going well [employer]. Denies SI or HI.  Denies AH or VH. Denies substance use.     GAD-7    Flowsheet Row Office Visit from 05/24/2018 in [facility] Visit from 04/03/2018 in [facility]  Total GAD-7 Score 2 9      PHQ2-9    [facility] Office Visit from 04/16/2022 in [facility] Visit from 01/14/2022 in [facility] Visit from 10/08/2021 in [facility] Visit from 04/10/2021 in [facility] Visit from 10/09/2020 in [facility]  PHQ-2 Total Score 0 0 0 0 0  PHQ-9 Total Score -- -- 2 2 --      Flowsheet Row Admission (Discharged) from 12/23/2021 in [facility] Admission (Discharged) from 08/03/2021 in [facility]  C-SSRS RISK CATEGORY No  Risk No Risk        Review of Systems:  Review of Systems  Musculoskeletal:  Negative for gait problem.  Neurological:  Negative for tremors.  Psychiatric/Behavioral:         Please refer to HPI    Medications: I have reviewed the patient's current medications.  Current Outpatient Medications  Medication Sig Dispense Refill   acetaminophen (TYLENOL) 500 MG tablet Take 1,000 mg by mouth every 6 (six) hours as needed for headache (pain).     ARIPiprazole (ABILIFY) 5 MG tablet Take 1 tablet by mouth daily. 90 tablet 0   aspirin 81 MG tablet Take 81 mg by mouth daily.     Bevacizumab (AVASTIN IV) Inject 1 Dose into the eye See admin instructions. Every six to eight weeks in left eye     carvedilol (COREG) 3.125 MG tablet Take 1 tablet by mouth twice daily. 180 tablet 3   ciprofloxacin (CILOXAN) 0.3 % ophthalmic solution Place 1 drop into the left eye 4 (four) times daily. After Eye injection every 6-8 weeks for two days     escitalopram (LEXAPRO) 20 MG tablet Take 1 tablet by mouth daily. 90 tablet 0   FARXIGA 10 MG TABS tablet TAKE 1 TABLET(10 MG) BY MOUTH DAILY BEFORE BREAKFAST 30 tablet 11   levothyroxine (SYNTHROID) 50 MCG tablet Take 1 tablet by mouth daily. 90 tablet 0   LORazepam (ATIVAN) 0.5 MG tablet Take 1 tablet (0.5 mg total) by mouth 2 (two) times daily as needed for anxiety. 60 tablet 2   metFORMIN (GLUCOPHAGE-XR) 500 MG 24 hr tablet  TAKE 2 TABLETS(1000 MG) BY MOUTH DAILY WITH BREAKFAST 90 tablet 1   Multiple Vitamins-Minerals (PRESERVISION AREDS 2 PO) Take 2 tablets by mouth in the morning.     nitroGLYCERIN (NITROSTAT) 0.4 MG SL tablet Place 1 tablet (0.4 mg total) under the tongue every 5 (five) minutes as needed for chest pain. 25 tablet 6   rosuvastatin (CRESTOR) 20 MG tablet Take 1 tablet by mouth daily. **Please call to schedule office visit prior to further refills** 90 tablet 3   spironolactone (ALDACTONE) 25 MG tablet Take 0.5 tablets (12.5 mg total) by mouth daily.  15 tablet 6   torsemide (DEMADEX) 20 MG tablet Take 20 mg by mouth daily as needed.     ursodiol (ACTIGALL) 500 MG tablet Take 500 mg by mouth 2 (two) times daily.     No current facility-administered medications for this visit.    Medication Side Effects: None  Allergies: No Known Allergies  Past Medical History:  Diagnosis Date   CAD (coronary artery disease), s/p stent 2011 and CABG 2015 [facility])    Hodgkin disease [facility]) 1997   Hyperlipidemia    Macular degeneration    PVC's (premature ventricular contractions)     Past Medical History, Surgical history, Social history, and Family history were reviewed and updated as appropriate.   Please see review of systems for further details on the patient's review from today.   Objective:   Physical Exam:  There were no vitals taken for this visit.  Physical Exam Constitutional:      General: He is not in acute distress. Musculoskeletal:        General: No deformity.  Neurological:     Mental Status: He is alert and oriented to person, place, and time.     Coordination: Coordination normal.  Psychiatric:        Attention and Perception: Attention and perception normal. He does not perceive auditory or visual hallucinations.        Mood and Affect: Mood normal. Mood is not anxious or depressed. Affect is not labile, blunt, angry or inappropriate.        Speech: Speech normal.        Behavior: Behavior normal.        Thought Content: Thought content normal. Thought content is not paranoid or delusional. Thought content does not include homicidal or suicidal ideation. Thought content does not include homicidal or suicidal plan.        Cognition and Memory: Cognition and memory normal.        Judgment: Judgment normal.     Comments: Insight intact     Lab Review:     Component Value Date/Time   NA 140 03/02/2022 1045   NA 142 01/21/2022 1335   NA 141 12/24/2015 1051   K 4.9 03/02/2022 1045   K 4.6 12/24/2015  1051   CL 108 03/02/2022 1045   CO2 26 03/02/2022 1045   CO2 28 12/24/2015 1051   GLUCOSE 113 (H) 03/02/2022 1045   GLUCOSE 95 12/24/2015 1051   BUN 21 (H) 03/02/2022 1045   BUN 26 (H) 01/21/2022 1335   BUN 14.6 12/24/2015 1051   CREATININE 1.05 03/02/2022 1045   CREATININE 1.1 12/24/2015 1051   CALCIUM 9.3 03/02/2022 1045   CALCIUM 9.8 12/24/2015 1051   PROT 7.8 04/16/2022 0957   PROT 6.8 07/24/2021 1131   PROT 7.6 12/24/2015 1051   ALBUMIN 4.9 04/16/2022 0957   ALBUMIN 4.3 07/24/2021 1131   ALBUMIN 4.5  12/24/2015 1051   AST 22 04/16/2022 0957   AST 25 12/24/2015 1051   ALT 23 04/16/2022 0957   ALT 34 12/24/2015 1051   ALKPHOS 131 (H) 04/16/2022 0957   ALKPHOS 45 12/24/2015 1051   BILITOT 1.2 04/16/2022 0957   BILITOT 1.7 (H) 07/24/2021 1131   BILITOT 0.99 12/24/2015 1051   GFRNONAA >60 03/02/2022 1045       Component Value Date/Time   WBC 8.1 12/21/2021 1221   WBC 7.6 10/08/2021 1043   RBC 4.25 12/21/2021 1221   RBC 4.22 10/08/2021 1043   HGB 13.9 12/21/2021 1221   HGB 15.7 12/24/2015 1050   HCT 41.0 12/21/2021 1221   HCT 44.7 12/24/2015 1050   PLT 309 12/21/2021 1221   MCV 97 12/21/2021 1221   MCV 93 12/24/2015 1050   MCH 32.7 12/21/2021 1221   MCH 30.1 08/04/2021 0247   MCHC 33.9 12/21/2021 1221   MCHC 33.5 10/08/2021 1043   RDW 14.1 12/21/2021 1221   RDW 12.9 12/24/2015 1050   LYMPHSABS 1.0 10/08/2021 1043   LYMPHSABS 1.3 12/24/2015 1050   MONOABS 0.6 10/08/2021 1043   EOSABS 0.1 10/08/2021 1043   EOSABS 0.1 12/24/2015 1050   BASOSABS 0.1 10/08/2021 1043   BASOSABS 0.0 12/24/2015 1050    No results found for: "POCLITH", "LITHIUM"   No results found for: "PHENYTOIN", "PHENOBARB", "VALPROATE", "CBMZ"   .res Assessment: Plan:    Plan:  1. Continue Lexapro '20mg'$  daily 2. Continue Ativan 0.'5mg'$  BID - rarely uses 3. Continue Abilify '5mg'$  daily.  Continue therapy with Rinaldo Cloud.   RTC 6 months  Patient advised to contact office with any  questions, adverse effects, or acute worsening in signs and symptoms.  Discussed potential benefits, risk, and side effects of benzodiazepines to include potential risk of tolerance and dependence, as well as possible drowsiness.  Advised patient not to drive if experiencing drowsiness and to take lowest possible effective dose to minimize risk of dependence and tolerance.   Discussed potential metabolic side effects associated with atypical antipsychotics, as well as potential risk for movement side effects. Advised pt to contact office if movement side effects occur.   There are no diagnoses linked to this encounter.   Please see After Visit Summary for patient specific instructions.  Future Appointments  Date Time Provider [place]  04/30/2022 10:00 AM Aviona Martenson, Berdie Ogren, NP [facility] None  05/12/2022 10:45 AM Constance Haw, MD [facility]  05/18/2022 10:00 AM [name], LCSW CP-[facility] None  05/21/2022  8:45 AM Hayden Pedro, MD [facility] None  07/02/2022 10:00 AM Bensimhon, Shaune Pascal, MD [facility] None  10/12/2022 10:00 AM Leamon Arnt, MD [facility]    No orders of the defined types were placed in this encounter.   -------------------------------

## 2022-05-12 ENCOUNTER — Other Ambulatory Visit: Payer: Self-pay | Admitting: Family Medicine

## 2022-05-12 ENCOUNTER — Ambulatory Visit: Payer: BC Managed Care – PPO | Attending: Cardiology | Admitting: Cardiology

## 2022-05-12 ENCOUNTER — Encounter: Payer: Self-pay | Admitting: Cardiology

## 2022-05-12 VITALS — BP 122/78 | HR 60 | Ht 67.0 in | Wt 160.0 lb

## 2022-05-12 DIAGNOSIS — I5022 Chronic systolic (congestive) heart failure: Secondary | ICD-10-CM | POA: Diagnosis not present

## 2022-05-12 DIAGNOSIS — I493 Ventricular premature depolarization: Secondary | ICD-10-CM | POA: Diagnosis not present

## 2022-05-12 NOTE — Progress Notes (Signed)
Electrophysiology Office Note   Date:  05/12/2022   ID:  Shane [name], DOB 1966-03-27, MRN [id number]  PCP:  Leamon Arnt, MD  Cardiologist:  Gwenlyn Found Primary Electrophysiologist:  Aryn Safran Meredith Leeds, MD    Chief Complaint: PVC   History of Present Illness: Shane [name] is a 56 y.o. male who is being seen today for the evaluation of PVC at the request of Leamon Arnt, MD. Presenting today for electrophysiology evaluation.  He has a history significant for coronary artery disease, Hodgkin's disease, hyperlipidemia, macular degeneration, PVCs, heart failure.  He had an anterior wall MI and LAD stent placed in 2011.  He recurrent symptoms and is post CABG with LIMA to the LAD, vein to the OM and RCA.  He was found to have a 20% PVC burden.  He is now status post ablation 12/23/2021 inferior to the right/left coronary commissure.  Today, denies symptoms of palpitations, chest pain, shortness of breath, orthopnea, PND, lower extremity edema, claudication, dizziness, presyncope, syncope, bleeding, or neurologic sequela. The patient is tolerating medications without difficulties.  Since his ablation he has done well.  He has had no chest pain or shortness of breath.  He has been able to do all of his daily activities.  He is unaware of further PVCs.  He has much more energy.  Past Medical History:  Diagnosis Date   CAD (coronary artery disease), s/p stent 2011 and CABG 2015 [facility])    Hodgkin disease [facility]) 1997   Hyperlipidemia    Macular degeneration    PVC's (premature ventricular contractions)    Past Surgical History:  Procedure Laterality Date   CARDIAC CATHETERIZATION  08/2009   PCI -LAD 2.60mx18mm Xience   CORONARY ARTERY BYPASS GRAFT  02/2014   PVC ABLATION N/A 12/23/2021   Procedure: PVC ABLATION;  Surgeon: CConstance Haw MD;  [facility];  Service: Cardiovascular;  Laterality: N/A;   RIGHT/LEFT HEART CATH AND CORONARY  ANGIOGRAPHY N/A 08/03/2021   Procedure: RIGHT/LEFT HEART CATH AND CORONARY ANGIOGRAPHY;  Surgeon: BLorretta Harp MD;  [facility];  Service: Cardiovascular;  Laterality: N/A;     Current Outpatient Medications  Medication Sig Dispense Refill   acetaminophen (TYLENOL) 500 MG tablet Take 1,000 mg by mouth every 6 (six) hours as needed for headache (pain).     ARIPiprazole (ABILIFY) 5 MG tablet Take 1 tablet (5 mg total) by mouth daily. 90 tablet 1   aspirin 81 MG tablet Take 81 mg by mouth daily.     Bevacizumab (AVASTIN IV) Inject 1 Dose into the eye See admin instructions. Every six to eight weeks in left eye     carvedilol (COREG) 3.125 MG tablet Take 1 tablet by mouth twice daily. 180 tablet 3   ciprofloxacin (CILOXAN) 0.3 % ophthalmic solution Place 1 drop into the left eye 4 (four) times daily. After Eye injection every 6-8 weeks for two days     escitalopram (LEXAPRO) 20 MG tablet Take 1 tablet (20 mg total) by mouth daily. 90 tablet 1   FARXIGA 10 MG TABS tablet TAKE 1 TABLET(10 MG) BY MOUTH DAILY BEFORE BREAKFAST 30 tablet 11   levothyroxine (SYNTHROID) 50 MCG tablet Take 1 tablet by mouth daily. 90 tablet 0   LORazepam (ATIVAN) 0.5 MG tablet Take 1 tablet (0.5 mg total) by mouth 2 (two) times daily as needed for anxiety. 60 tablet 2   metFORMIN (GLUCOPHAGE-XR) 500 MG 24 hr tablet TAKE  2 TABLETS(1000 MG) BY MOUTH DAILY WITH BREAKFAST 90 tablet 1   Multiple Vitamins-Minerals (PRESERVISION AREDS 2 PO) Take 2 tablets by mouth in the morning.     nitroGLYCERIN (NITROSTAT) 0.4 MG SL tablet Place 1 tablet (0.4 mg total) under the tongue every 5 (five) minutes as needed for chest pain. 25 tablet 6   rosuvastatin (CRESTOR) 20 MG tablet Take 1 tablet by mouth daily. **Please call to schedule office visit prior to further refills** 90 tablet 3   spironolactone (ALDACTONE) 25 MG tablet Take 0.5 tablets (12.5 mg total) by mouth daily. 15 tablet 6   torsemide (DEMADEX) 20 MG  tablet Take 20 mg by mouth daily as needed.     ursodiol (ACTIGALL) 500 MG tablet Take 500 mg by mouth 2 (two) times daily.     No current facility-administered medications for this visit.    Allergies:   Patient has no known allergies.   Social History:  The patient  reports that he has never smoked. He has quit using smokeless tobacco. He reports current alcohol use of about 3.0 standard drinks of alcohol per week. He reports that he does not use drugs.   Family History:  The patient's family history includes COPD in his father and mother; Cancer in his maternal grandmother and mother; Heart Problems in his brother and brother; Heart attack in his father; Heart disease in his father; Stroke in his mother.    ROS:  Please see the history of present illness.   Otherwise, review of systems is positive for none.   All other systems are reviewed and negative.   PHYSICAL EXAM: VS:  BP 122/78   Pulse 60   Ht '5\' 7"'$  (1.702 m)   Wt 160 lb (72.6 kg)   SpO2 95%   BMI 25.06 kg/m  , BMI Body mass index is 25.06 kg/m. GEN: Well nourished, well developed, in no acute distress  HEENT: normal  Neck: no JVD, carotid bruits, or masses Cardiac: RRR; no murmurs, rubs, or gallops,no edema  Respiratory:  clear to auscultation bilaterally, normal work of breathing GI: soft, nontender, nondistended, + BS MS: no deformity or atrophy  Skin: warm and dry Neuro:  Strength and sensation are intact Psych: euthymic mood, full affect  EKG:  EKG is not ordered today. Personal review of the ekg ordered 03/02/22 shows sinus rhythm, rate 58, anteroseptal infarct  Recent Labs: 10/27/2021: Magnesium 2.6 12/21/2021: Hemoglobin 13.9; Platelets 309 03/02/2022: B Natriuretic Peptide 852.2; BUN 21; Creatinine, Ser 1.05; Potassium 4.9; Sodium 140 04/16/2022: ALT 23; TSH 3.33    Lipid Panel     Component Value Date/Time   CHOL 146 10/08/2021 1043   CHOL 109 10/19/2019 1146   TRIG 67.0 10/08/2021 1043   HDL 67.70  10/08/2021 1043   HDL 44 10/19/2019 1146   CHOLHDL 2 10/08/2021 1043   VLDL 13.4 10/08/2021 1043   LDLCALC 65 10/08/2021 1043   LDLCALC 50 10/19/2019 1146     Wt Readings from Last 3 Encounters:  05/12/22 160 lb (72.6 kg)  04/16/22 156 lb 3.2 oz (70.9 kg)  03/02/22 156 lb 9.6 oz (71 kg)      Other studies Reviewed: Additional studies/ records that were reviewed today include: TTE 03/12/22 Review of the above records today demonstrates:   1. Cannot exclude LV apical thrombus. Left ventricular ejection fraction,  by estimation, is 25 to 30%. The left ventricle has severely decreased  function. The left ventricle demonstrates regional wall motion  abnormalities (see  scoring diagram/findings  for description). There is mild concentric left ventricular hypertrophy.  Left ventricular diastolic parameters are consistent with Grade II  diastolic dysfunction (pseudonormalization). There is akinesis of the left  ventricular, basal-mid inferior wall,  inferoseptal wall, inferolateral wall and apical segment. There is  dyskinesis of the left ventricular, mid-apical anteroseptal wall.   2. Right ventricular systolic function is mildly reduced. The right  ventricular size is normal. There is normal pulmonary artery systolic  pressure.   3. Left atrial size was moderately dilated.   4. Right atrial size was mildly dilated.   5. The mitral valve is normal in structure. Mild to moderate mitral valve  regurgitation. No evidence of mitral stenosis.   6. The aortic valve is tricuspid. There is mild calcification of the  aortic valve. Aortic valve regurgitation is not visualized. No aortic  stenosis is present.   7. The inferior vena cava is normal in size with greater than 50%  respiratory variability, suggesting right atrial pressure of 3 mmHg.   Cardiac monitor 03/22/2022 personally reviewed 1. Sinus rhythm - min HR of 46 bpm, max HR of 129 bpm, and avg HR of 64 bpm.  2. Rare PACs 3. PVCs  were occasional (2.4%, [id number]) - with one predominant morphology 4. Ventricular Bigeminy and Trigeminy were present  ASSESSMENT AND PLAN:  1.  PVCs: Had a 13% burden that unfortunately went up to 20%.  He is post ablation under the right left coronary cusp 12/23/2021.  Burden is down to 2%.  He is currently feeling well.  We Shane [name] continue with current management.  2.  Coronary artery disease: Status post CABG.  Occluded RCA.  With left to right collaterals.  No chest pain.  3.  Chronic systolic heart failure: Currently on Farxiga 10 mg a daily, carvedilol 3.125 mg twice daily, Aldactone 12.5 mg daily.  Repeat echo post PVC ablation shows persistently low ejection fraction.  There is potentially a plan for cardiac PET to look for sarcoidosis.  We Chaquita Basques discuss with his primary cardiologist whether or not he thinks an ICD is indicated.   Current medicines are reviewed at length with the patient today.   The patient does not have concerns regarding his medicines.  The following changes were made today: None  Labs/ tests ordered today include:  No orders of the defined types were placed in this encounter.    Disposition:   FU with Emmanuela Ghazi pending discussion with primary cardiology months  Signed, Shane Bernales Meredith Leeds, MD  05/12/2022 10:57 AM     [address] [place] [address] [place] [address] [phone] (office) [phone] (fax)

## 2022-05-18 ENCOUNTER — Ambulatory Visit: Payer: BC Managed Care – PPO | Admitting: Psychiatry

## 2022-05-18 DIAGNOSIS — F411 Generalized anxiety disorder: Secondary | ICD-10-CM | POA: Diagnosis not present

## 2022-05-18 NOTE — Progress Notes (Signed)
[facility] Counselor/Therapist Progress Note  Patient ID: Shane [name], MRN: [id number],    Date: 05/18/2022  Time Spent: 55 minutes    Treatment Type: Individual Therapy  Reported Symptoms: anxiety  Mental Status Exam:  Appearance:   Neat     Behavior:  Appropriate, Sharing, and Motivated  Motor:  Normal  Speech/Language:   Clear and Coherent  Affect:  anxious  Mood:  anxious  Thought process:  goal directed  Thought content:    WNL  Sensory/Perceptual disturbances:    WNL  Orientation:  oriented to person, place, time/date, situation, day of week, month of year, year, and stated date of Nov. 7, 2023  Attention:  Good  Concentration:  Good  Memory:  Some occasional short term memory issues "but not overwhelming"  Fund of knowledge:   Good  Insight:    Good  Judgment:   Good  Impulse Control:  Good   Risk Assessment: Danger to Self:  No Self-injurious Behavior: No Danger to Others: No Duty to Warn:no Physical Aggression / Violence:No  Access to Firearms a concern: No  Gang Involvement:No   Subjective:  Patient in today reporting anxiety and having increased "edginess" and feels it is related to work and some many distressing things in the world. Doesn't think coworkers notice it but patient states he feels it often. Lots of issues with building new network for work, which "involves taking network apart and putting it back together again." Anxiety and "edginess" mostly at work, is more of a problem when doing new projects or projects that he hasn't touched in a while. Processed his recognizing the security feature as he explains it and trusting himself and his knowledge more, also knowing he and partner knows techniques that can help if they do run into difficulty with system.  Also working on his not making assumptions that "things are not going to go well" before he begins a new project or picking up 1 that he has not worked on in a while.  Discussed some  specific examples in session with patient which seem to be helpful to him and his feeling more encouraged and believing in himself.  Denies any depression and states that his only symptoms recently have been the anxiety/"edginess".  States at end of session today that he feels he has some tools to work with the anxiety and edginess more and will report out at next session.  States his physical health has continued to be relatively stable.  Less obsessing about the future, less overall worrying, and still hoping to retire within 2 to 3 years.  Interventions: Cognitive Behavioral Therapy and Ego-Supportive  Long Term Goal: Enhance the ability to handle effectively his depression and the full variety of life's anxieties.  Short Term Goal:  Verbalize an understanding of how thoughts, physical feelings, and behavioral actions contribute to anxiety and anger and its treatment. Strategy:  Patient will work to recognize and interrupt triggers to his anxious or depressive thoughts  so as to replace with positive, more reality-based thoughts, which lead to more positive actions/behaviors.  Diagnosis:   ICD-10-CM   1. Generalized anxiety disorder  F41.1      Plan: Patient today working quite well on some anxiety/"edginess" that has been bothering him mostly at work, and occasionally at home but is almost always related to work related tasks, as described above.  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  Also knowing that he has been able to stop and start systems when things go wrong is something he needs to remember, versus worrying in advance about what might go.  He is very skilled at his  job and is trying to worry less and has made some progress over time.  Making some progress and "not letting Ehinger overpower me".  Typically does quite well and following through after the session and applying techniques or skills discussed in sessions.  Definitely feeling healthier which is a positive.  Added that he and his wife are planning to adopt 3 "foster kittens" within a month.  Outlook continues to be more positive.  Feels that marital relationship also continues to be positive and moving in a good direction. Encourage patient in his practice of positive behaviors including: Remaining focused on the present and the things he can control versus cannot, use of deep breathing exercises discussed in sessions to help with his anxiety, practicing more active listening skills with his wife and others, consistently practice positive self talk, be aware positives each day, stay on medications as prescribed, stay in contact with supportive people, and recognize the strength he shows working with goal-directed behaviors to move in a direction that supports his improved emotional health  Goal review and progress/challenges noted with patient.  Next appointment within 4 weeks.  This record has been created using [employer].  Chart creation errors have been sought, but may not always have been located and corrected.  Such creation errors do not reflect on the standard of medical care provided.   [name], LCSW

## 2022-05-21 ENCOUNTER — Encounter (INDEPENDENT_AMBULATORY_CARE_PROVIDER_SITE_OTHER): Payer: BC Managed Care – PPO | Admitting: Ophthalmology

## 2022-05-21 DIAGNOSIS — H35033 Hypertensive retinopathy, bilateral: Secondary | ICD-10-CM

## 2022-05-21 DIAGNOSIS — I10 Essential (primary) hypertension: Secondary | ICD-10-CM

## 2022-05-21 DIAGNOSIS — H32 Chorioretinal disorders in diseases classified elsewhere: Secondary | ICD-10-CM

## 2022-05-21 DIAGNOSIS — H43813 Vitreous degeneration, bilateral: Secondary | ICD-10-CM

## 2022-05-21 DIAGNOSIS — H353221 Exudative age-related macular degeneration, left eye, with active choroidal neovascularization: Secondary | ICD-10-CM | POA: Diagnosis not present

## 2022-05-21 DIAGNOSIS — H348322 Tributary (branch) retinal vein occlusion, left eye, stable: Secondary | ICD-10-CM

## 2022-05-21 DIAGNOSIS — B399 Histoplasmosis, unspecified: Secondary | ICD-10-CM | POA: Diagnosis not present

## 2022-05-23 DIAGNOSIS — G4733 Obstructive sleep apnea (adult) (pediatric): Secondary | ICD-10-CM | POA: Diagnosis not present

## 2022-06-07 ENCOUNTER — Telehealth: Payer: Self-pay | Admitting: Cardiology

## 2022-06-07 ENCOUNTER — Encounter: Payer: Self-pay | Admitting: Cardiology

## 2022-06-07 NOTE — Telephone Encounter (Signed)
Advised patient that Shane [name] is out of the office and his message will be forwarded to her for review.

## 2022-06-07 NOTE — Telephone Encounter (Signed)
Pt states he is returning Sherri's Call in regards to a future procedure. Requesting call back.

## 2022-06-14 NOTE — Telephone Encounter (Signed)
Pt aware ok to proceed with scheduling ICD implant.  He and his wife are discussing possible [place] vaca beginning of next year but they haven't made  decision yet. He is going to discuss further with her and let me know via mychart. We will discuss procedure dates once decision has been made on their possible travel plans.

## 2022-06-22 DIAGNOSIS — G4733 Obstructive sleep apnea (adult) (pediatric): Secondary | ICD-10-CM | POA: Diagnosis not present

## 2022-06-28 ENCOUNTER — Other Ambulatory Visit: Payer: Self-pay | Admitting: Family Medicine

## 2022-06-28 DIAGNOSIS — G4733 Obstructive sleep apnea (adult) (pediatric): Secondary | ICD-10-CM | POA: Diagnosis not present

## 2022-06-28 NOTE — Telephone Encounter (Signed)
April spoke to pt last week, he could not go this month for procedure. Will reach out to him at later date to find a date that works . Pt agreeable to plan.

## 2022-06-30 DIAGNOSIS — F319 Bipolar disorder, unspecified: Secondary | ICD-10-CM | POA: Diagnosis not present

## 2022-06-30 DIAGNOSIS — R251 Tremor, unspecified: Secondary | ICD-10-CM | POA: Diagnosis not present

## 2022-06-30 DIAGNOSIS — F411 Generalized anxiety disorder: Secondary | ICD-10-CM | POA: Diagnosis not present

## 2022-06-30 DIAGNOSIS — F4010 Social phobia, unspecified: Secondary | ICD-10-CM | POA: Diagnosis not present

## 2022-07-02 ENCOUNTER — Encounter (HOSPITAL_COMMUNITY): Payer: Self-pay | Admitting: Internal Medicine

## 2022-07-02 ENCOUNTER — Other Ambulatory Visit (HOSPITAL_COMMUNITY): Payer: BC Managed Care – PPO

## 2022-07-02 ENCOUNTER — Ambulatory Visit (HOSPITAL_COMMUNITY)
Admission: RE | Admit: 2022-07-02 | Discharge: 2022-07-02 | Disposition: A | Discharge status: Home / Self Care | Payer: BC Managed Care – PPO | Source: Ambulatory Visit | Attending: Internal Medicine | Admitting: Internal Medicine

## 2022-07-02 VITALS — BP 114/70 | HR 64 | Wt 165.6 lb

## 2022-07-02 DIAGNOSIS — I3139 Other pericardial effusion (noninflammatory): Secondary | ICD-10-CM | POA: Diagnosis not present

## 2022-07-02 DIAGNOSIS — I959 Hypotension, unspecified: Secondary | ICD-10-CM | POA: Insufficient documentation

## 2022-07-02 DIAGNOSIS — Z9221 Personal history of antineoplastic chemotherapy: Secondary | ICD-10-CM | POA: Diagnosis not present

## 2022-07-02 DIAGNOSIS — I5022 Chronic systolic (congestive) heart failure: Secondary | ICD-10-CM

## 2022-07-02 DIAGNOSIS — I493 Ventricular premature depolarization: Secondary | ICD-10-CM | POA: Diagnosis not present

## 2022-07-02 DIAGNOSIS — Z923 Personal history of irradiation: Secondary | ICD-10-CM | POA: Insufficient documentation

## 2022-07-02 DIAGNOSIS — G4733 Obstructive sleep apnea (adult) (pediatric): Secondary | ICD-10-CM | POA: Diagnosis not present

## 2022-07-02 DIAGNOSIS — E785 Hyperlipidemia, unspecified: Secondary | ICD-10-CM | POA: Insufficient documentation

## 2022-07-02 DIAGNOSIS — Z7984 Long term (current) use of oral hypoglycemic drugs: Secondary | ICD-10-CM | POA: Insufficient documentation

## 2022-07-02 DIAGNOSIS — I110 Hypertensive heart disease with heart failure: Secondary | ICD-10-CM | POA: Diagnosis not present

## 2022-07-02 DIAGNOSIS — I25810 Atherosclerosis of coronary artery bypass graft(s) without angina pectoris: Secondary | ICD-10-CM | POA: Diagnosis not present

## 2022-07-02 DIAGNOSIS — I2510 Atherosclerotic heart disease of native coronary artery without angina pectoris: Secondary | ICD-10-CM

## 2022-07-02 DIAGNOSIS — Z955 Presence of coronary angioplasty implant and graft: Secondary | ICD-10-CM | POA: Insufficient documentation

## 2022-07-02 DIAGNOSIS — Z8571 Personal history of Hodgkin lymphoma: Secondary | ICD-10-CM | POA: Diagnosis not present

## 2022-07-02 DIAGNOSIS — I255 Ischemic cardiomyopathy: Secondary | ICD-10-CM | POA: Diagnosis not present

## 2022-07-02 DIAGNOSIS — I252 Old myocardial infarction: Secondary | ICD-10-CM | POA: Diagnosis not present

## 2022-07-02 DIAGNOSIS — Z79899 Other long term (current) drug therapy: Secondary | ICD-10-CM | POA: Diagnosis not present

## 2022-07-02 DIAGNOSIS — E119 Type 2 diabetes mellitus without complications: Secondary | ICD-10-CM | POA: Insufficient documentation

## 2022-07-02 LAB — BASIC METABOLIC PANEL
Anion gap: 7 (ref 5–15)
BUN: 15 mg/dL (ref 6–20)
CO2: 25 mmol/L (ref 22–32)
Calcium: 9.4 mg/dL (ref 8.9–10.3)
Chloride: 107 mmol/L (ref 98–111)
Creatinine, Ser: 0.99 mg/dL (ref 0.61–1.24)
GFR, Estimated: >60 mL/min (ref >60)
Glucose, Bld: 99 mg/dL (ref 70–99)
Potassium: 4.7 mmol/L (ref 3.5–5.1)
Sodium: 139 mmol/L (ref 135–145)

## 2022-07-02 LAB — BRAIN NATRIURETIC PEPTIDE: B Natriuretic Peptide: 572.2 pg/mL — ABNORMAL HIGH (ref 0.0–100.0)

## 2022-07-02 MED ORDER — LOSARTAN POTASSIUM 25 MG PO TABS: 25.0000 mg | ORAL_TABLET | Freq: Every day | ORAL | 3 refills | Status: DC

## 2022-07-02 NOTE — Addendum Note (Signed)
Encounter addended by: Scarlette Calico, RN on: 07/02/2022 10:52 AM  Actions taken: Order list changed, Diagnosis association updated, Clinical Note Signed, Charge Capture section accepted

## 2022-07-02 NOTE — Telephone Encounter (Signed)
ICD implant date held for 09/08/22. Pt aware EP scheduled will contact him to go over instructions. Patient verbalized understanding and agreeable to plan.

## 2022-07-02 NOTE — Patient Instructions (Signed)
Medication Changes:  START Losartan 25 mg Daily  Lab Work:  Labs done today, your results will be available in MyChart, we will contact you for abnormal readings.  Testing/Procedures:  none  Referrals:  none  Special Instructions // Education:  Do the following things EVERYDAY: Weigh yourself in the morning before breakfast. Write it down and keep it in a log. Take your medicines as prescribed Eat low salt foods--Limit salt (sodium) to 2000 mg per day.  Stay as active as you can everyday Limit all fluids for the day to less than 2 liters   Follow-Up in: 6 months (June 2024), **PLEASE CALL OUR OFFICE IN [place] TO SCHEDULE THIS APPOINTMENT  At the [facility], you and your health needs are our priority. We have a designated team specialized in the treatment of Heart Failure. This Care Team includes your primary Heart Failure Specialized Cardiologist (physician), Advanced Practice Providers (APPs- Physician Assistants and Nurse Practitioners), and Pharmacist who all work together to provide you with the care you need, when you need it.   You may see any of the following providers on your designated Care Team at your next follow up:  Dr. Glori Bickers Dr. Loralie Champagne Dr. Roxana Hires, NP Lyda Jester, [place] [facility] [place], [place] Forestine Na, NP Audry Riles, PharmD   Please be sure to bring in all your medications bottles to every appointment.   Need to Contact us:  If you have any questions or concerns before your next appointment please send [place] a message through [place] or call our office at [phone].    TO LEAVE A MESSAGE FOR THE NURSE SELECT OPTION 2, PLEASE LEAVE A MESSAGE INCLUDING: YOUR NAME DATE OF BIRTH CALL BACK NUMBER REASON FOR CALL**this is important as we prioritize the call backs  YOU WILL RECEIVE A CALL BACK THE SAME DAY AS LONG AS YOU CALL BEFORE 4:00 PM

## 2022-07-02 NOTE — Progress Notes (Signed)
ADVANCED HF CLINIC PROGRESS NOTE  Primary Care: Leamon Arnt, MD General Cardiologist: Dr. Gwenlyn Found HF Cardiologist: Dr. Haroldine Laws   Reason for Visit: F/u chronic systolic heart failure  HPI: Shane [name] is a 56 y.o. 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   Echo at time of CABG showed moderately reduced LVEF, 30-35%. EF improved post revascularization and had normalized, back to 50-55%, on repeat echo in 2016. In 2017, he underwent cardiac w/u for decrease exercise tolerance. Echo w/ normal LVEF, 50-55%, no significant valvular diease.    Echo 1/23 EF back down to 30-35%, RV mildly reduced, mild-mod MR. Mod TR.    R/LHC on 1/23 occluded native RCA and RCA vein graft with left-to-right collaterals. The remainder of his grafts were patent.  All his native arteries were occluded. RHC showed elevated right sided filling pressures and marginal CO (see hemodynamics below). He was direct admitted for IV diuretics and AHF consultation. He was also observed to have frequent PVCs ~25/min and previous records showed where prior Zio monitor had also showed high burden PVCs ~13%. He was placed on mexiletine for suppression but c/w frequent PVCs, was later switched to amio 200 mg bid. cMRI EF 29% c/w ischemic CM w/ coronary disease pattern LGE update/ scar. Also noted to have large posterior pericardial effusion w/o tamponade as well as moderate TR.   He had repeat Zio 2/23 while on amio + mexiletine. This showed reduction in PVC burden from 13 to 8.8%. He was subsequently referred to EP for PVC ablation. Seen by Dr. Curt Bears. Given concern for side effects w/ long term use of amio + pt preference, amiodarone was discontinued and mexiletine was increased to 300 mg bid. Dr. Curt Bears recommended repeat Zio to re quantify burden on monotherapy. Zio 6/23 19.1% PVCs. Underwent  PVC ablation 6/23  Following with Dr. Claiborne Billings. Now on Bipap.   Dr. Claiborne Billings following for OSA. AHI 18.1 started on bipap  Echo 11/10/21 EF 30-35%  Zio 9/23: 2.4% PVCs  Here with his with wife. Was walking on TM but no longer doing that. Wants to use the [employer]. Getting around fine. No CP or SOB. No edema, orthopnea or PND. BP 120/80s. No problems with meds.    Cardiac Studies  - Echo (1/23): EF 30-35%, moderate LV dysfunction, global LV HK, RV mildly reduced, RVSP 30.7 mmHg, mild to mod MR, moderate TR   - R/LHC 08/03/21:  occluded RCA and RCA vein graft with left-to-right collaterals.  The remainder of his grafts are patent.  All his native arteries were occluded.   Hemodynamics: 1: Right atrial pressure-17/17 2: Right ventricular pressure-51/23, mean 27 3: Pulmonary artery pressure-57/9, mean 35 4: Pulmonary wedge pressure-A-wave 28, V wave 24, mean 17 5: Cardiac output-3.7 L/min with an index of 2 L/min/m by Fick 6: LVEDP-24   - Cardiac MRI 08/04/21: Ischemic scar pattern.  LVEF 29% RVEF 41% Mod TR Large posterior pericardial effusion w/o tamponade   Zio 2/23 - PVC burden 8.8%      Past Medical History:  Diagnosis Date   CAD (coronary artery disease), s/p stent 2011 and CABG 2015 [facility])    Hodgkin disease [facility]) 1997   Hyperlipidemia    Macular degeneration    PVC's (premature ventricular contractions)    Current Outpatient Medications  Medication Sig Dispense Refill  acetaminophen (TYLENOL) 500 MG tablet Take 1,000 mg by mouth every 6 (six) hours as needed for headache (pain).     ARIPiprazole (ABILIFY) 5 MG tablet Take 1 tablet (5 mg total) by mouth daily. 90 tablet 1   aspirin 81 MG tablet Take 81 mg by mouth daily.     Bevacizumab (AVASTIN IV) Inject 1 Dose into the eye See admin instructions. Every six to eight weeks in left eye     carvedilol (COREG) 3.125 MG tablet Take 1 tablet by mouth twice daily. 180 tablet 3   ciprofloxacin (CILOXAN) 0.3 %  ophthalmic solution Place 1 drop into the left eye 4 (four) times daily. After Eye injection every 6-8 weeks for two days     escitalopram (LEXAPRO) 20 MG tablet Take 1 tablet (20 mg total) by mouth daily. 90 tablet 1   FARXIGA 10 MG TABS tablet TAKE 1 TABLET(10 MG) BY MOUTH DAILY BEFORE BREAKFAST 30 tablet 11   levothyroxine (SYNTHROID) 50 MCG tablet Take 1 tablet by mouth daily. 90 tablet 0   LORazepam (ATIVAN) 0.5 MG tablet Take 1 tablet (0.5 mg total) by mouth 2 (two) times daily as needed for anxiety. 60 tablet 2   metFORMIN (GLUCOPHAGE-XR) 500 MG 24 hr tablet TAKE 2 TABLETS(1000 MG) BY MOUTH DAILY WITH BREAKFAST 90 tablet 3   Multiple Vitamins-Minerals (PRESERVISION AREDS 2 PO) Take 2 tablets by mouth in the morning.     nitroGLYCERIN (NITROSTAT) 0.4 MG SL tablet Place 1 tablet (0.4 mg total) under the tongue every 5 (five) minutes as needed for chest pain. 25 tablet 6   rosuvastatin (CRESTOR) 20 MG tablet Take 1 tablet by mouth daily. **Please call to schedule office visit prior to further refills** 90 tablet 3   spironolactone (ALDACTONE) 25 MG tablet Take 0.5 tablets (12.5 mg total) by mouth daily. 15 tablet 6   torsemide (DEMADEX) 20 MG tablet Take 20 mg by mouth daily as needed.     ursodiol (ACTIGALL) 500 MG tablet Take 500 mg by mouth 2 (two) times daily.     No current facility-administered medications for this encounter.   No Known Allergies  Social History   Socioeconomic History   Marital status: Married    Spouse name: Not on file   Number of children: Not on file   Years of education: Not on file   Highest education level: Not on file  Occupational History   Not on file  Tobacco Use   Smoking status: Never   Smokeless tobacco: Former   Tobacco comments:    a long time ago  Vaping Use   Vaping Use: Never used  Substance and Sexual Activity   Alcohol use: Yes    Alcohol/week: 3.0 standard drinks of alcohol    Types: 3 Cans of beer per week    Comment: sometimes  3 beers daily   Drug use: No   Sexual activity: Yes  Other Topics Concern   Not on file  Social History Narrative   Not on file   Social Determinants of Health   Financial Resource Strain: Not on file  Food Insecurity: Not on file  Transportation Needs: Not on file  Physical Activity: Not on file  Stress: Not on file  Social Connections: Not on file  Intimate Partner Violence: Not on file   Family History  Problem Relation Age of Onset   Stroke Mother    Cancer Mother    COPD Mother    Heart disease Father  COPD Father    Heart attack Father    Heart Problems Brother        heart valve replaced at 74 years old   Heart Problems Brother        MI at age 45; CABG at age 57   Cancer Maternal Grandmother    Colon cancer Neg Hx    BP 114/70   Pulse 64   Wt 75.1 kg (165 lb 9.6 oz)   SpO2 96%   BMI 25.94 kg/m   Wt Readings from Last 3 Encounters:  07/02/22 75.1 kg (165 lb 9.6 oz)  05/12/22 72.6 kg (160 lb)  04/16/22 70.9 kg (156 lb 3.2 oz)   PHYSICAL EXAM: General:  Well appearing. No resp difficulty HEENT: normal Neck: supple. no JVD. Carotids 2+ bilat; no bruits. No lymphadenopathy or thryomegaly appreciated. Cor: PMI nondisplaced. Regular rate & rhythm. No rubs, gallops or murmurs. Lungs: clear Abdomen: soft, nontender, nondistended. No hepatosplenomegaly. No bruits or masses. Good bowel sounds. Extremities: no cyanosis, clubbing, rash, edema Neuro: alert & orientedx3, cranial nerves grossly intact. moves all 4 extremities w/o difficulty. Affect pleasant   ASSESSMENT & PLAN:  1. Chronic Systolic Heart Failure  - prior h/o ischemic CM, that improved post revascularization. - EF 30-35% at time of CABG in 2015, EF normalized on repeat Echos in 2016 and 2017 (50-55%) - Echo (1/23): EF back down, 30-35%, RV mildly reduced.  - R/LHC (1/23) w/ occluded SVG-RCA but distal RCA filled by L>R collaterals. All other grafts patent. EF out of proportion to degree of CAD.  RHC w/ elevated Rt sided filling pressures w/ fairly normal PCWP ~17. CO marginal, CI 2.0.  - Suspect PVC - mediated CM - cMRI (1/23) EF 29% with inferior and inferlolateral scar with some RV LGE with ? sarcoid - Repeat Zio 6/23 19.1% PVC - 6/23 PVC ablation  - Echo 11/10/21 EF 30-35%  - Stable NYHA I-II - Off Entresto due to low BP. Start losartan 25 daily  - Continue spiro 12.5 daily - Continue Farxiga 10 mg daily. - Continue Coreg 3.125 mg bid. - Taking torsemide 20 PRN Has not used recently  - It seems like PVCs are suppressed post ablation will repeat Zio to confirm. Also repeat echo if EF still down can consider PET scanning to look for potential sarcoid   2. PVCs - High PVC burden, Holter 5/21 showed 13% burden.  - Suspect contributing to CM. - No coronary ischemia on cath.  - Zio 2/23 w/ burden down to 8.8% on dual AAD therapy w/ amio + mexilitine  - Zio 5/23 19.1% PVC - PVC ablation 6/23 Zio 9/23: 2.4% PVCs - On Bipap    3. CAD - remote MI 2011, s/p PCI to pLAD - CABG x 4 in 2015 - LHC (1/23): w/ 3/4 patent grafts, occluded SVG- RCA w/ L>R collaterals.  - No s/s angina - Continue ASA + statin - Continue Coreg 6.25 mg bid   4. Pericardial effusion - Large posterior effusion on MRI without tamponade.  - Continue w/ diuretics. - Resolved on echo 5/23   5. Hypertension  - Blood pressure well controlled. Adding losartan as above   6. OSA - on BIPAP followed by Dr. Claiborne Billings  - encouraged him to use more regualrly  7. Type 2DM  - recent A1c 7.2 - on Metformin + Rhodia Albright, MD  10:33 AM

## 2022-07-02 NOTE — Addendum Note (Signed)
Encounter addended by: Scarlette Calico, RN on: 07/02/2022 11:02 AM  Actions taken: Pharmacy for encounter modified, Order list changed

## 2022-07-16 ENCOUNTER — Encounter (INDEPENDENT_AMBULATORY_CARE_PROVIDER_SITE_OTHER): Payer: BC Managed Care – PPO | Admitting: Ophthalmology

## 2022-07-20 ENCOUNTER — Ambulatory Visit (INDEPENDENT_AMBULATORY_CARE_PROVIDER_SITE_OTHER): Payer: BC Managed Care – PPO | Admitting: Psychiatry

## 2022-07-20 DIAGNOSIS — F411 Generalized anxiety disorder: Secondary | ICD-10-CM

## 2022-07-20 NOTE — Progress Notes (Signed)
[facility] Counselor/Therapist Progress Note  Patient ID: Shane [name], MRN: [id number],    Date: 07/20/2022  Time Spent: 55 minutes   Treatment Type: Individual Therapy  Reported Symptoms: anxiety, work-related anxiety  Mental Status Exam:  Appearance:   Casual and Neat     Behavior:  Appropriate, Sharing, and Motivated  Motor:  Normal  Speech/Language:   Clear and Coherent  Affect:  anxious  Mood:  anxious  Thought process:  goal directed  Thought content:    WNL  Sensory/Perceptual disturbances:    WNL  Orientation:  oriented to person, place, time/date, situation, day of week, month of year, year, and stated date of Jan. 9, 2024  Attention:  Good  Concentration:  Good  Memory:  WNL  Fund of knowledge:   Good  Insight:    Good  Judgment:   Good  Impulse Control:  Fair   Risk Assessment: Danger to Self:  No Self-injurious Behavior: No Danger to Others: No Duty to Warn:no Physical Aggression / Violence:No  Access to Firearms a concern: No  Gang Involvement:No   Subjective: Patient today in sessio and reporting anxiety that he stated is mostly work-related. Adds that his work stress is improving some and having less "edgy-ness". Sleep is good "but I need to use my  c-pap machine on more." New projects at work creating more stress. Patient is confident in his abilities  at work but some new project that is requiring increased knowledge and skills but feels he is making progress. Requested more help with work stress and anxiety management which we focused on strategies and ways patient can use them at home or in his office to better recognize onset of anxiety/stress more quickly and achieve success in implementing strategies rather than letting the anxiety de-rail him.  Also feels the strategies can help him at home in times of stress as well.  Reports not having any depression and feels positive today of about this.  Does have a long-term plan of retirement in 2026.   Continues to experience less obsessiveness especially with any worrying or thinking about the future.  Interventions: Cognitive Behavioral Therapy and Ego-Supportive  Long Term Goal: Enhance the ability to handle effectively his depression and the full variety of life's anxieties.  Short Term Goal:  Verbalize an understanding of how thoughts, physical feelings, and behavioral actions contribute to anxiety and anger and its treatment. Strategy:  Patient will work to recognize and interrupt triggers to his anxious or depressive thoughts  so as to replace with positive, more reality-based thoughts, which lead to more positive actions/behaviors.  Diagnosis:   ICD-10-CM   1. Generalized anxiety disorder  F41.1      Plan: Patient in today and working well in session as we focused primarily on his "anxiety/edginess" that he feels is more work-related.  Worked with some specific thoughts and strategies and especially in getting an earlier jump on the anxiety as soon as he notices it, including paying attention to thoughts preceding his the point where he feels noticeably anxious.  Worked on some thought stopping and placements, CBT strategies, and also some that are more physically based when he can actually get up and move away from his desk/office to take a break.  Feeling more positive and other areas of his life including his health although there are still some issues he is having but overall is feeling that his physical health is better and wants to be more resilient and his  mental health.  Positive outlook and definitely less turmoil in relationships. Encouraged patient in his practice of more positive behaviors including: Staying focused in the present and on the things that he can control versus cannot control, use of deep breathing exercises as noted in sessions to help with his anxiety, practicing more active listening skills with his wife and others, consistently practice positive self talk, be  aware of positives each day, stay on medications as prescribed, remain in contact with supportive people, and realize the strength he shows and working with goal-directed behaviors to move in a direction that supports his improved emotional health.  Goal review and progress/challenges noted with patient.  Next appt within approximately 4 to 6 weeks.  This record has been created using [employer].  Chart creation errors have been sought, but may not always have been located and corrected.  Such creation errors do not reflect on the standard of medical care provided.   [name], LCSW

## 2022-07-22 ENCOUNTER — Encounter (INDEPENDENT_AMBULATORY_CARE_PROVIDER_SITE_OTHER): Payer: BC Managed Care – PPO | Admitting: Ophthalmology

## 2022-07-22 DIAGNOSIS — B399 Histoplasmosis, unspecified: Secondary | ICD-10-CM

## 2022-07-22 DIAGNOSIS — I10 Essential (primary) hypertension: Secondary | ICD-10-CM

## 2022-07-22 DIAGNOSIS — H34832 Tributary (branch) retinal vein occlusion, left eye, with macular edema: Secondary | ICD-10-CM

## 2022-07-22 DIAGNOSIS — H35033 Hypertensive retinopathy, bilateral: Secondary | ICD-10-CM

## 2022-07-22 DIAGNOSIS — H43813 Vitreous degeneration, bilateral: Secondary | ICD-10-CM

## 2022-07-22 DIAGNOSIS — H353221 Exudative age-related macular degeneration, left eye, with active choroidal neovascularization: Secondary | ICD-10-CM

## 2022-07-22 DIAGNOSIS — H32 Chorioretinal disorders in diseases classified elsewhere: Secondary | ICD-10-CM

## 2022-07-23 DIAGNOSIS — G4733 Obstructive sleep apnea (adult) (pediatric): Secondary | ICD-10-CM | POA: Diagnosis not present

## 2022-08-09 ENCOUNTER — Other Ambulatory Visit: Payer: Self-pay

## 2022-08-09 DIAGNOSIS — I5022 Chronic systolic (congestive) heart failure: Secondary | ICD-10-CM

## 2022-08-11 DIAGNOSIS — M25519 Pain in unspecified shoulder: Secondary | ICD-10-CM | POA: Diagnosis not present

## 2022-08-18 DIAGNOSIS — M25519 Pain in unspecified shoulder: Secondary | ICD-10-CM | POA: Diagnosis not present

## 2022-08-23 DIAGNOSIS — G4733 Obstructive sleep apnea (adult) (pediatric): Secondary | ICD-10-CM | POA: Diagnosis not present

## 2022-08-24 ENCOUNTER — Ambulatory Visit: Payer: BC Managed Care – PPO | Attending: Cardiology

## 2022-08-24 DIAGNOSIS — I5022 Chronic systolic (congestive) heart failure: Secondary | ICD-10-CM

## 2022-08-25 LAB — BASIC METABOLIC PANEL
BUN/Creatinine Ratio: 16 (ref 9–20)
BUN: 18 mg/dL (ref 6–24)
CO2: 22 mmol/L (ref 20–29)
Calcium: 9.5 mg/dL (ref 8.7–10.2)
Chloride: 102 mmol/L (ref 96–106)
Creatinine, Ser: 1.1 mg/dL (ref 0.76–1.27)
Glucose: 147 mg/dL — ABNORMAL HIGH (ref 70–99)
Potassium: 4.9 mmol/L (ref 3.5–5.2)
Sodium: 140 mmol/L (ref 134–144)
eGFR: 78 mL/min/{1.73_m2} (ref >59)

## 2022-08-25 LAB — CBC
Hematocrit: 42 % (ref 37.5–51.0)
Hemoglobin: 14.2 g/dL (ref 13.0–17.7)
MCH: 32.1 pg (ref 26.6–33.0)
MCHC: 33.8 g/dL (ref 31.5–35.7)
MCV: 95 fL (ref 79–97)
Platelets: 264 10*3/uL (ref 150–450)
RBC: 4.43 x10E6/uL (ref 4.14–5.80)
RDW: 12.6 % (ref 11.6–15.4)
WBC: 6.6 10*3/uL (ref 3.4–10.8)

## 2022-08-27 ENCOUNTER — Other Ambulatory Visit: Payer: Self-pay | Admitting: Cardiovascular Disease

## 2022-09-07 NOTE — Pre-Procedure Instructions (Signed)
Instructed patient on the following items: Arrival time 1:00 Nothing to eat or drink after midnight No meds AM of procedure Responsible person to drive you home and stay with you for 24 hrs Wash with special soap night before and morning of procedure

## 2022-09-08 ENCOUNTER — Other Ambulatory Visit: Payer: Self-pay

## 2022-09-08 ENCOUNTER — Encounter (HOSPITAL_COMMUNITY)
Admission: RE | Disposition: A | Discharge status: Home / Self Care | Payer: Self-pay | Source: Ambulatory Visit | Attending: Cardiology

## 2022-09-08 ENCOUNTER — Ambulatory Visit (HOSPITAL_COMMUNITY): Payer: BC Managed Care – PPO

## 2022-09-08 ENCOUNTER — Ambulatory Visit (HOSPITAL_COMMUNITY)
Admission: RE | Admit: 2022-09-08 | Discharge: 2022-09-08 | Disposition: A | Discharge status: Home / Self Care | Payer: BC Managed Care – PPO | Source: Ambulatory Visit | Attending: Cardiology | Admitting: Cardiology

## 2022-09-08 DIAGNOSIS — I252 Old myocardial infarction: Secondary | ICD-10-CM | POA: Insufficient documentation

## 2022-09-08 DIAGNOSIS — I493 Ventricular premature depolarization: Secondary | ICD-10-CM | POA: Insufficient documentation

## 2022-09-08 DIAGNOSIS — Z8571 Personal history of Hodgkin lymphoma: Secondary | ICD-10-CM | POA: Diagnosis not present

## 2022-09-08 DIAGNOSIS — I255 Ischemic cardiomyopathy: Secondary | ICD-10-CM | POA: Insufficient documentation

## 2022-09-08 DIAGNOSIS — I5022 Chronic systolic (congestive) heart failure: Secondary | ICD-10-CM | POA: Insufficient documentation

## 2022-09-08 DIAGNOSIS — Z951 Presence of aortocoronary bypass graft: Secondary | ICD-10-CM | POA: Insufficient documentation

## 2022-09-08 DIAGNOSIS — I2510 Atherosclerotic heart disease of native coronary artery without angina pectoris: Secondary | ICD-10-CM | POA: Insufficient documentation

## 2022-09-08 DIAGNOSIS — Z950 Presence of cardiac pacemaker: Secondary | ICD-10-CM | POA: Diagnosis not present

## 2022-09-08 DIAGNOSIS — Z955 Presence of coronary angioplasty implant and graft: Secondary | ICD-10-CM | POA: Insufficient documentation

## 2022-09-08 DIAGNOSIS — E785 Hyperlipidemia, unspecified: Secondary | ICD-10-CM | POA: Insufficient documentation

## 2022-09-08 HISTORY — PX: ICD IMPLANT: EP1208

## 2022-09-08 SURGERY — ICD IMPLANT

## 2022-09-08 MED ORDER — FENTANYL CITRATE (PF) 100 MCG/2ML IJ SOLN
INTRAMUSCULAR | Status: DC | PRN
  Administered 2022-09-08 (×2): 25 ug via INTRAVENOUS

## 2022-09-08 MED ORDER — SODIUM CHLORIDE 0.9 % IV SOLN: INTRAVENOUS | Status: DC

## 2022-09-08 MED ORDER — CHLORHEXIDINE GLUCONATE 4 % EX LIQD
4.0000 | Freq: Once | CUTANEOUS | Status: DC
  Filled 2022-09-08: qty 60

## 2022-09-08 MED ORDER — CEFAZOLIN SODIUM-DEXTROSE 2-4 GM/100ML-% IV SOLN
INTRAVENOUS | Status: AC
  Filled 2022-09-08: qty 100

## 2022-09-08 MED ORDER — MIDAZOLAM HCL 5 MG/5ML IJ SOLN
INTRAMUSCULAR | Status: AC
  Filled 2022-09-08: qty 5

## 2022-09-08 MED ORDER — LIDOCAINE HCL 1 % IJ SOLN
INTRAMUSCULAR | Status: AC
  Filled 2022-09-08: qty 40

## 2022-09-08 MED ORDER — HEPARIN (PORCINE) IN NACL 1000-0.9 UT/500ML-% IV SOLN
INTRAVENOUS | Status: DC | PRN
  Administered 2022-09-08: 500 mL

## 2022-09-08 MED ORDER — MIDAZOLAM HCL 5 MG/5ML IJ SOLN
INTRAMUSCULAR | Status: DC | PRN
  Administered 2022-09-08 (×2): 1 mg via INTRAVENOUS

## 2022-09-08 MED ORDER — SODIUM CHLORIDE 0.9 % IV SOLN
INTRAVENOUS | Status: AC
  Filled 2022-09-08: qty 2

## 2022-09-08 MED ORDER — SODIUM CHLORIDE 0.9 % IV SOLN
80.0000 mg | INTRAVENOUS | Status: AC
  Administered 2022-09-08: 80 mg

## 2022-09-08 MED ORDER — LIDOCAINE HCL (PF) 1 % IJ SOLN
INTRAMUSCULAR | Status: DC | PRN
  Administered 2022-09-08: 60 mL

## 2022-09-08 MED ORDER — CEFAZOLIN SODIUM-DEXTROSE 1-4 GM/50ML-% IV SOLN
1.0000 g | Freq: Four times a day (QID) | INTRAVENOUS | Status: DC
  Administered 2022-09-08: 1 g via INTRAVENOUS
  Filled 2022-09-08: qty 50

## 2022-09-08 MED ORDER — FENTANYL CITRATE (PF) 100 MCG/2ML IJ SOLN
INTRAMUSCULAR | Status: AC
  Filled 2022-09-08: qty 2

## 2022-09-08 MED ORDER — ACETAMINOPHEN 325 MG PO TABS
325.0000 mg | ORAL_TABLET | ORAL | Status: DC | PRN
  Administered 2022-09-08: 650 mg via ORAL
  Filled 2022-09-08: qty 2

## 2022-09-08 MED ORDER — LIDOCAINE HCL 1 % IJ SOLN
INTRAMUSCULAR | Status: AC
  Filled 2022-09-08: qty 20

## 2022-09-08 MED ORDER — ONDANSETRON HCL 4 MG/2ML IJ SOLN: 4.0000 mg | Freq: Four times a day (QID) | INTRAMUSCULAR | Status: DC | PRN

## 2022-09-08 MED ORDER — POVIDONE-IODINE 10 % EX SWAB
2.0000 | Freq: Once | CUTANEOUS | Status: AC
  Administered 2022-09-08: 2 via TOPICAL

## 2022-09-08 MED ORDER — CEFAZOLIN SODIUM-DEXTROSE 2-4 GM/100ML-% IV SOLN
2.0000 g | INTRAVENOUS | Status: AC
  Administered 2022-09-08: 2 g via INTRAVENOUS

## 2022-09-08 SURGICAL SUPPLY — 6 items
CABLE SURGICAL S-101-97-12 (CABLE) ×1 IMPLANT
ICD COBALT XT VR DVPA2D4 (ICD Generator) IMPLANT
LEAD SPRINT QUAT SEC 6935M-62 (Lead) IMPLANT
PAD DEFIB RADIO PHYSIO CONN (PAD) ×1 IMPLANT
SHEATH 9FR PRELUDE SNAP 13 (SHEATH) IMPLANT
TRAY PACEMAKER INSERTION (PACKS) ×1 IMPLANT

## 2022-09-08 NOTE — H&P (Signed)
Electrophysiology Office Note   Date:  09/08/2022   ID:  Shane [name], DOB 05/07/1966, MRN [id number]  PCP:  Leamon Arnt, MD  Cardiologist:  Shane [name] Primary Electrophysiologist:  Shane Art Meredith Leeds, MD    Chief Complaint: PVC   History of Present Illness: Shane [name] is a 57 y.o. male who is being seen today for the evaluation of PVC at the request of No ref. provider [name]. Presenting today for electrophysiology evaluation.  He has a history significant for coronary artery disease, Hodgkin's disease, hyperlipidemia, macular degeneration, PVCs, heart failure.  He had an anterior wall MI and LAD stent placed in 2011.  He recurrent symptoms and is post CABG with LIMA to the LAD, vein to the OM and RCA.  He was [name] to have a 20% PVC burden.  He is now status post ablation 12/23/2021 inferior to the right/left coronary commissure.  Today, denies symptoms of palpitations, chest pain, shortness of breath, orthopnea, PND, lower extremity edema, claudication, dizziness, presyncope, syncope, bleeding, or neurologic sequela. The patient is tolerating medications without difficulties.  Since his ablation he has done well.  He has had no chest pain or shortness of breath.  He has been able to do all of his daily activities.  He is unaware of further PVCs.  He has much more energy.  Past Medical History:  Diagnosis Date   CAD (coronary artery disease), s/p stent 2011 and CABG 2015 [facility])    Hodgkin disease [facility]) 1997   Hyperlipidemia    Macular degeneration    PVC's (premature ventricular contractions)    Past Surgical History:  Procedure Laterality Date   CARDIAC CATHETERIZATION  08/2009   PCI -LAD 2.72mx18mm Xience   CORONARY ARTERY BYPASS GRAFT  02/2014   PVC ABLATION N/A 12/23/2021   Procedure: PVC ABLATION;  Surgeon: Shane Haw MD;  [facility];  Service: Cardiovascular;  Laterality: N/A;   RIGHT/LEFT HEART CATH AND CORONARY  ANGIOGRAPHY N/A 08/03/2021   Procedure: RIGHT/LEFT HEART CATH AND CORONARY ANGIOGRAPHY;  Surgeon: BLorretta Harp MD;  [facility];  Service: Cardiovascular;  Laterality: N/A;     Current Facility-Administered Medications  Medication Dose Route Frequency Provider Last Rate Last Admin   0.9 %  sodium chloride infusion   Intravenous Continuous Shane Haw MD 50 mL/hr at 09/08/22 1115 New Bag at 09/08/22 1115   0.9 %  sodium chloride infusion   Intravenous Continuous Shane [name] MHassell Done MD 50 mL/hr at 09/08/22 1112 New Bag at 09/08/22 1112   ceFAZolin (ANCEF) IVPB 2g/100 mL premix  2 g Intravenous On Call Shane [name] MHassell Done MD       chlorhexidine (HIBICLENS) 4 % liquid 4 Application  4 Application Topical Once Shane [name] MHassell Done MD       gentamicin (GARAMYCIN) 80 mg in sodium chloride 0.9 % 500 mL irrigation  80 mg Irrigation On Call Shane Haw MD        Allergies:   Patient has no known allergies.   Social History:  The patient  reports that he has never smoked. He has quit using smokeless tobacco. He reports current alcohol use of about 3.0 standard drinks of alcohol per week. He reports that he does not use drugs.   Family History:  The patient's family history includes COPD in his father and mother; Cancer in his maternal grandmother and mother; Heart Problems in his brother and brother; Heart attack in his father;  Heart disease in his father; Stroke in his mother.    ROS:  Please see the history of present illness.   Otherwise, review of systems is positive for none.   All other systems are reviewed and negative.   PHYSICAL EXAM: VS:  BP 119/77   Pulse 62   Temp 98.1 F (36.7 C) (Oral)   Resp 16   Ht 5' 7.5" (1.715 m)   Wt 72.6 kg   SpO2 99%   BMI 24.69 kg/m  , BMI Body mass index is 24.69 kg/m. GEN: Well nourished, well developed, in no acute distress  HEENT: normal  Neck: no JVD, carotid bruits, or masses Cardiac: RRR; no  murmurs, rubs, or gallops,no edema  Respiratory:  clear to auscultation bilaterally, normal work of breathing GI: soft, nontender, nondistended, + BS MS: no deformity or atrophy  Skin: warm and dry Neuro:  Strength and sensation are intact Psych: euthymic mood, full affect  EKG:  EKG is not ordered today. Personal review of the ekg ordered 03/02/22 shows sinus rhythm, rate 58, anteroseptal infarct  Recent Labs: 10/27/2021: Magnesium 2.6 04/16/2022: ALT 23; TSH 3.33 07/02/2022: B Natriuretic Peptide 572.2 08/24/2022: BUN 18; Creatinine, Ser 1.10; Hemoglobin 14.2; Platelets 264; Potassium 4.9; Sodium 140    Lipid Panel     Component Value Date/Time   CHOL 146 10/08/2021 1043   CHOL 109 10/19/2019 1146   TRIG 67.0 10/08/2021 1043   HDL 67.70 10/08/2021 1043   HDL 44 10/19/2019 1146   CHOLHDL 2 10/08/2021 1043   VLDL 13.4 10/08/2021 1043   LDLCALC 65 10/08/2021 1043   LDLCALC 50 10/19/2019 1146     Wt Readings from Last 3 Encounters:  09/08/22 72.6 kg  07/02/22 75.1 kg  05/12/22 72.6 kg      Other studies Reviewed: Additional studies/ records that were reviewed today include: TTE 03/12/22 Review of the above records today demonstrates:   1. Cannot exclude LV apical thrombus. Left ventricular ejection fraction,  by estimation, is 25 to 30%. The left ventricle has severely decreased  function. The left ventricle demonstrates regional wall motion  abnormalities (see scoring diagram/findings  for description). There is mild concentric left ventricular hypertrophy.  Left ventricular diastolic parameters are consistent with Grade II  diastolic dysfunction (pseudonormalization). There is akinesis of the left  ventricular, basal-mid inferior wall,  inferoseptal wall, inferolateral wall and apical segment. There is  dyskinesis of the left ventricular, mid-apical anteroseptal wall.   2. Right ventricular systolic function is mildly reduced. The right  ventricular size is normal.  There is normal pulmonary artery systolic  pressure.   3. Left atrial size was moderately dilated.   4. Right atrial size was mildly dilated.   5. The mitral valve is normal in structure. Mild to moderate mitral valve  regurgitation. No evidence of mitral stenosis.   6. The aortic valve is tricuspid. There is mild calcification of the  aortic valve. Aortic valve regurgitation is not visualized. No aortic  stenosis is present.   7. The inferior vena cava is normal in size with greater than 50%  respiratory variability, suggesting right atrial pressure of 3 mmHg.   Cardiac monitor 03/22/2022 personally reviewed 1. Sinus rhythm - min HR of 46 bpm, max HR of 129 bpm, and avg HR of 64 bpm.  2. Rare PACs 3. PVCs were occasional (2.4%, [id number]) - with one predominant morphology 4. Ventricular Bigeminy and Trigeminy were present  ASSESSMENT AND PLAN:  1.  PVCs: Had a 13%  burden that unfortunately went up to 20%.  He is post ablation under the right left coronary cusp 12/23/2021.  Burden is down to 2%.  He is currently feeling well.  We Temitope Griffing continue with current management.  2.  Coronary artery disease: Status post CABG.  Occluded RCA.  With left to right collaterals.  No chest pain.  3.  Chronic systolic heart failure:  ICD Criteria  Current LVEF:33%. Within 12 months prior to implant: Yes   Heart failure history: Yes, Class II  Cardiomyopathy history: Yes, Ischemic Cardiomyopathy - Prior MI.  Atrial Fibrillation/Atrial Flutter: No.  Ventricular tachycardia history: No.  Cardiac arrest history: No.  History of syndromes with risk of sudden death: No.  Previous ICD: No.  Current ICD indication: Primary  PPM indication: No.  Class I or II Bradycardia indication present: No  Beta Blocker therapy for 3 or more months: Yes, prescribed.   Ace Inhibitor/ARB therapy for 3 or more months: Yes, prescribed.    I have seen KARREEM CAN is a 57 y.o. malepre-procedural and has been  referred by Bensimhon for consideration of ICD implant for primary prevention of sudden death.  The patient's chart has been reviewed and they meet criteria for ICD implant.  I have had a thorough discussion with the patient reviewing options.  The patient and their family (if available) have had opportunities to ask questions and have them answered. The patient and I have decided together through the [employer] to implant ICD at this time.  Risks, benefits, alternatives to ICD implantation were discussed in detail with the patient today. The patient  understands that the risks include but are not limited to bleeding, infection, pneumothorax, perforation, tamponade, vascular damage, renal failure, MI, stroke, death, inappropriate shocks, and lead dislodgement and wishes to proceed.

## 2022-09-08 NOTE — Discharge Instructions (Signed)
After Your ICD (Implantable Cardiac Defibrillator)   You have a [employer] ICD  ACTIVITY Do not lift your arm above shoulder height for 1 week after your procedure. After 7 days, you may progress as below.  You should remove your sling 24 hours after your procedure, unless otherwise instructed by your provider.     Wednesday September 15, 2022  Thursday September 16, 2022 Friday September 17, 2022 Saturday September 18, 2022   Do not lift, push, pull, or carry anything over 10 pounds with the affected arm until 6 weeks (Wednesday October 20, 2022 ) after your procedure.   You may drive AFTER your wound check, unless you have been told otherwise by your provider.   Ask your healthcare provider when you can go back to work   INCISION/Dressing If you are on a blood thinner such as Coumadin, Xarelto, Eliquis, Plavix, or Pradaxa please confirm with your provider when this should be resumed.   If large square, outer bandage is left in place, this can be removed after 24 hours from your procedure. Do not remove steri-strips or glue as below.   Monitor your defibrillator site for redness, swelling, and drainage. Call the device clinic at [phone] if you experience these symptoms or fever/chills.  If your incision is sealed with Steri-strips or staples, you may shower 7 days after your procedure or when told by your provider. Do not remove the steri-strips or let the shower hit directly on your site. You may wash around your site with soap and water.    If you were discharged in a sling, please do not wear this during the day more than 48 hours after your surgery unless otherwise instructed. This may increase the risk of stiffness and soreness in your shoulder.   Avoid lotions, ointments, or perfumes over your incision until it is well-healed.  You may use a hot tub or a pool AFTER your wound check appointment if the incision is completely closed.  Your ICD is designed to protect you from life threatening  heart rhythms. Because of this, you may receive a shock.   1 shock with no symptoms:  Call the office during business hours. 1 shock with symptoms (chest pain, chest pressure, dizziness, lightheadedness, shortness of breath, overall feeling unwell):  Call 911. If you experience 2 or more shocks in 24 hours:  Call 911. If you receive a shock, you should not drive for 6 months per the [place] DMV IF you receive appropriate therapy from your ICD.   ICD Alerts:  Some alerts are vibratory and others beep. These are NOT emergencies. Please call our office to let us know. If this occurs at night or on weekends, it can wait until the next business day. Send a remote transmission.  If your device is capable of reading fluid status (for heart failure), you will be offered monthly monitoring to review this with you.   DEVICE MANAGEMENT Remote monitoring is used to monitor your ICD from home. This monitoring is scheduled every 91 days by our office. It allows [place] to keep an eye on the functioning of your device to ensure it is working properly. You will routinely see your Electrophysiologist annually (more often if necessary).   You should receive your ID card for your new device in 4-8 weeks. Keep this card with you at all times once received. Consider wearing a medical alert bracelet or necklace.  Your ICD  may be MRI compatible. This will be discussed at your next  office visit/wound check.  You should avoid contact with strong electric or magnetic fields.   Do not use amateur (ham) radio equipment or electric (arc) welding torches. MP3 player headphones with magnets should not be used. Some devices are safe to use if held at least 12 inches (30 cm) from your defibrillator. These include power tools, lawn mowers, and speakers. If you are unsure if something is safe to use, ask your health care provider.  When using your cell phone, hold it to the ear that is on the opposite side from the defibrillator. Do not  leave your cell phone in a pocket over the defibrillator.  You may safely use electric blankets, heating pads, computers, and microwave ovens.  Call the office right away if: You have chest pain. You feel more than one shock. You feel more short of breath than you have felt before. You feel more light-headed than you have felt before. Your incision starts to open up.  This information is not intended to replace advice given to you by your health care provider. Make sure you discuss any questions you have with your health care provider.

## 2022-09-08 NOTE — Progress Notes (Signed)
Called and shared CXR results with Dr. Curt Bears. MD stated that it was okay for patient to be discharged.

## 2022-09-09 ENCOUNTER — Encounter: Payer: Self-pay | Admitting: Cardiology

## 2022-09-09 ENCOUNTER — Encounter (HOSPITAL_COMMUNITY): Payer: Self-pay | Admitting: Cardiology

## 2022-09-14 ENCOUNTER — Ambulatory Visit (INDEPENDENT_AMBULATORY_CARE_PROVIDER_SITE_OTHER): Payer: BC Managed Care – PPO | Admitting: Psychiatry

## 2022-09-14 DIAGNOSIS — F411 Generalized anxiety disorder: Secondary | ICD-10-CM

## 2022-09-14 NOTE — Progress Notes (Signed)
[facility] Counselor/Therapist Progress Note  Patient ID: Shane [name], MRN: [id number],    Date: 09/14/2022  Time Spent: 45 minutes   Treatment Type: Individual Therapy  Virtual Visit via [facility] with patient by a telemedicine/telehealth application, with their informed consent, and verified patient privacy and that I am speaking with the correct person using two identifiers. I discussed the limitations, risks, security and privacy concerns of performing psychotherapy and the availability of in person appointments. I also discussed with the patient that there may be a patient responsible charge related to this service. The patient expressed understanding and agreed to proceed. I discussed the treatment planning with the patient. The patient was provided an opportunity to ask questions and all were answered. The patient agreed with the plan and demonstrated an understanding of the instructions. The patient was advised to call  our office if  symptoms worsen or feel they are in a crisis state and need immediate contact.   Therapist Location: office Patient Location: home   Reported Symptoms: anxiety  Mental Status Exam:  Appearance:   Casual     Behavior:  Appropriate, Sharing, and Motivated  Motor:  Normal  Speech/Language:   Clear and Coherent  Affect:  anxious  Mood:  anxious  Thought process:  goal directed  Thought content:    WNL  Sensory/Perceptual disturbances:    WNL  Orientation:  oriented to person, place, time/date, situation, day of week, month of year, year, and stated date of Marchj 5, 2024  Attention:  Good  Concentration:  Good  Memory:  WNL  Fund of knowledge:   Good  Insight:    Good  Judgment:   Good  Impulse Control:  Good   Risk Assessment: Danger to Self:  No Self-injurious Behavior: No Danger to Others: No Duty to Warn:no Physical Aggression / Violence:No  Access to Firearms a concern: No  Gang  Involvement:No   Subjective:  Patient in session today reporting anxiety as his main concern, and it has improved some.  Depression has dramatically improved over time. Sleep "is solid". Still using his c-pap machine. Work stress is about the same, no worse and feels he is managing it better. "Not getting on edge so much anymore." "Any of my new projects at work create more stress, but I'm managing the stress some better." Some anxiety around recent "surgery to implant a defibrillator". States staring at the lights just prior to surgery, "made me think more about my future", and went on to process this more in session today.  Reports trying not to stress about the future, which he states he is doing pretty well with that.  Each day that goes by seems to get a little better as far as his anxiety about the surgery he had done.  Interventions: Cognitive Behavioral Therapy and Ego-Supportive  Long Term Goal: Enhance the ability to handle effectively his depression and the full variety of life's anxieties.  Short Term Goal:  Verbalize an understanding of how thoughts, physical feelings, and behavioral actions contribute to anxiety and anger and its treatment. Strategy:  Patient will work to recognize and interrupt triggers to his anxious or depressive thoughts  so as to replace with positive, more reality-based thoughts, which lead to more positive actions/behaviors.  Diagnosis:   ICD-10-CM   1. Generalized anxiety disorder  F41.1      Plan: Patient working well in session today focusing on anxiety as he is recently had a defibrillator  implanted and was quite anxiety producing for patient.  Talked through this in detail today and did say towards the end of session that he is definitely feeling some better and noticing some decrease in his anxiety over time since his surgery.  Discussed with him what helps him better manage his anxiety during times like this and patient was very responsive and clearly  connected how his own self-talk also affected his anxiety, which we focused more on before ending the session today.  Shared that he is beginning to think more about eventual retirement and seems to be more relaxed with that at this point.  Reviewed some of the previous work on thought stopping and CBT strategies to help patient with his anxiety both at work and outside of work.  His outlook remains positive overall even with his stress and anxiety. Encouraged patient in his practice of more self-affirming and positive behaviors as noted in session including: Remaining focused in the present and on the things that he can control versus cannot control, use of deep breathing exercises as noted in session to help with his anxiety, practicing more active listening skills with his wife and others, consistently practice positive self talk, be aware of positives each day, stay on medications as prescribed, remain in contact with supportive people, and recognize the strength he shows working with goal-directed behaviors to move in a direction that supports his improved emotional health and overall wellbeing.  Goal review and progress/challenges noted with patient.  Next appt within 4-5 weeks.  This record has been created using [employer].  Chart creation errors have been sought, but may not always have been located and corrected.  Such creation errors do not reflect on the standard of medical care provided.   [name], LCSW

## 2022-09-16 ENCOUNTER — Encounter (INDEPENDENT_AMBULATORY_CARE_PROVIDER_SITE_OTHER): Payer: BC Managed Care – PPO | Admitting: Ophthalmology

## 2022-09-16 DIAGNOSIS — H32 Chorioretinal disorders in diseases classified elsewhere: Secondary | ICD-10-CM

## 2022-09-16 DIAGNOSIS — H353221 Exudative age-related macular degeneration, left eye, with active choroidal neovascularization: Secondary | ICD-10-CM | POA: Diagnosis not present

## 2022-09-16 DIAGNOSIS — I10 Essential (primary) hypertension: Secondary | ICD-10-CM | POA: Diagnosis not present

## 2022-09-16 DIAGNOSIS — H35033 Hypertensive retinopathy, bilateral: Secondary | ICD-10-CM | POA: Diagnosis not present

## 2022-09-16 DIAGNOSIS — H43813 Vitreous degeneration, bilateral: Secondary | ICD-10-CM

## 2022-09-16 DIAGNOSIS — H34832 Tributary (branch) retinal vein occlusion, left eye, with macular edema: Secondary | ICD-10-CM | POA: Diagnosis not present

## 2022-09-16 DIAGNOSIS — B399 Histoplasmosis, unspecified: Secondary | ICD-10-CM

## 2022-09-21 DIAGNOSIS — G4733 Obstructive sleep apnea (adult) (pediatric): Secondary | ICD-10-CM | POA: Diagnosis not present

## 2022-09-22 ENCOUNTER — Ambulatory Visit: Payer: Self-pay

## 2022-09-22 NOTE — Progress Notes (Unsigned)
Cardiology Office Note Date:  09/22/2022  Patient ID:  Shane, [name] 03-Mar-1966, MRN [id number] PCP:  Leamon Arnt, MD  Cardiologist:  Dr. Haroldine Laws Electrophysiologist: Dr. Curt Bears  ***refresh   Chief Complaint: *** wound check  History of Present Illness: Shane [name] is a 57 y.o. male with history of Hodgkins Lymphoma (treated with chemo and radiation tx), HLD, PVCs (ablated), CAD (CABG 2015), macular degeneration, ICM, chronic CHF  He saw Dr. Curt Bears 05/12/22, unaware of any PVCs, doing better, though despite improved PVC burden , LVEF remained low and discussed possible ICD  He saw Dr. Haroldine Laws 07/02/22, doing OK, planned new monitoring/echo, if EF remained reduced would consider PET  Underwent ICD implant 09/08/22  *** site *** restrictions *** volume *** HF f/u    Device information MDT single chamber ICD implanted 09/08/22  Amiodarone >> stopped for preference to avoid potential side effects Feb 2023 Mexilletine > stopped June 2023 (unclear, notes report 2/2 hypotension) PVC ablation 12/23/2021 inferior to the right/left coronary commissure. (Monitor 03/22/22 PVC burden 2.4%)  Past Medical History:  Diagnosis Date   CAD (coronary artery disease), s/p stent 2011 and CABG 2015 [facility])    Hodgkin disease [facility]) 1997   Hyperlipidemia    Macular degeneration    PVC's (premature ventricular contractions)     Past Surgical History:  Procedure Laterality Date   CARDIAC CATHETERIZATION  08/2009   PCI -LAD 2.36mx18mm Xience   CORONARY ARTERY BYPASS GRAFT  02/2014   ICD IMPLANT N/A 09/08/2022   Procedure: ICD IMPLANT;  Surgeon: CConstance Haw MD;  [facility];  Service: Cardiovascular;  Laterality: N/A;   PVC ABLATION N/A 12/23/2021   Procedure: PVC ABLATION;  Surgeon: CConstance Haw MD;  [facility];  Service: Cardiovascular;  Laterality: N/A;   RIGHT/LEFT HEART CATH AND CORONARY ANGIOGRAPHY  N/A 08/03/2021   Procedure: RIGHT/LEFT HEART CATH AND CORONARY ANGIOGRAPHY;  Surgeon: BLorretta Harp MD;  [facility];  Service: Cardiovascular;  Laterality: N/A;    Current Outpatient Medications  Medication Sig Dispense Refill   acetaminophen (TYLENOL) 500 MG tablet Take 500 mg by mouth every 6 (six) hours as needed for headache (pain).     ARIPiprazole (ABILIFY) 5 MG tablet Take 1 tablet (5 mg total) by mouth daily. 90 tablet 1   aspirin 81 MG tablet Take 81 mg by mouth daily.     Bevacizumab (AVASTIN IV) Inject 1 Dose into the eye See admin instructions. Every six to eight weeks in left eye     carvedilol (COREG) 3.125 MG tablet Take 1 tablet by mouth twice daily. 180 tablet 2   ciprofloxacin (CILOXAN) 0.3 % ophthalmic solution Place 1 drop into the left eye 4 (four) times daily. After Eye injection every 6-8 weeks for two days     escitalopram (LEXAPRO) 20 MG tablet Take 1 tablet (20 mg total) by mouth daily. 90 tablet 1   FARXIGA 10 MG TABS tablet TAKE 1 TABLET(10 MG) BY MOUTH DAILY BEFORE BREAKFAST 30 tablet 11   levothyroxine (SYNTHROID) 50 MCG tablet Take 1 tablet by mouth daily. 90 tablet 0   LORazepam (ATIVAN) 0.5 MG tablet Take 1 tablet (0.5 mg total) by mouth 2 (two) times daily as needed for anxiety. 60 tablet 2   losartan (COZAAR) 25 MG tablet Take 1 tablet (25 mg total) by mouth daily. 90 tablet 3   metFORMIN (GLUCOPHAGE-XR) 500 MG 24 hr  tablet TAKE 2 TABLETS(1000 MG) BY MOUTH DAILY WITH BREAKFAST 90 tablet 3   nitroGLYCERIN (NITROSTAT) 0.4 MG SL tablet Place 1 tablet (0.4 mg total) under the tongue every 5 (five) minutes as needed for chest pain. 25 tablet 6   rosuvastatin (CRESTOR) 20 MG tablet Take 1 tablet by mouth daily. **Please call to schedule office visit prior to further refills** 90 tablet 3   spironolactone (ALDACTONE) 25 MG tablet Take 0.5 tablets (12.5 mg total) by mouth daily. 15 tablet 6   torsemide (DEMADEX) 20 MG tablet Take 20 mg by mouth  daily as needed (swelling).     ursodiol (ACTIGALL) 500 MG tablet Take 500 mg by mouth 2 (two) times daily.     No current facility-administered medications for this visit.    Allergies:   Patient has no known allergies.   Social History:  The patient  reports that he has never smoked. He has quit using smokeless tobacco. He reports current alcohol use of about 3.0 standard drinks of alcohol per week. He reports that he does not use drugs.   Family History:  The patient's family history includes COPD in his father and mother; Cancer in his maternal grandmother and mother; Heart Problems in his brother and brother; Heart attack in his father; Heart disease in his father; Stroke in his mother.  ROS:  Please see the history of present illness.    All other systems are reviewed and otherwise negative.   PHYSICAL EXAM:  VS:  There were no vitals taken for this visit. BMI: There is no height or weight on file to calculate BMI. Well nourished, well developed, in no acute distress HEENT: normocephalic, atraumatic Neck: no JVD, carotid bruits or masses Cardiac:  *** RRR; no significant murmurs, no rubs, or gallops Lungs:  *** CTA b/l, no wheezing, rhonchi or rales Abd: soft, nontender MS: no deformity or *** atrophy Ext: *** no edema Skin: warm and dry, no rash Neuro:  No gross deficits appreciated Psych: euthymic mood, full affect  *** ICD site is stable, no tethering or discomfort   EKG:  not done today  Device interrogation done today and reviewed by myself:  ***  TTE 03/12/22   1. Cannot exclude LV apical thrombus. Left ventricular ejection fraction,  by estimation, is 25 to 30%. The left ventricle has severely decreased  function. The left ventricle demonstrates regional wall motion  abnormalities (see scoring diagram/findings  for description). There is mild concentric left ventricular hypertrophy.  Left ventricular diastolic parameters are consistent with Grade II  diastolic  dysfunction (pseudonormalization). There is akinesis of the left  ventricular, basal-mid inferior wall,  inferoseptal wall, inferolateral wall and apical segment. There is  dyskinesis of the left ventricular, mid-apical anteroseptal wall.   2. Right ventricular systolic function is mildly reduced. The right  ventricular size is normal. There is normal pulmonary artery systolic  pressure.   3. Left atrial size was moderately dilated.   4. Right atrial size was mildly dilated.   5. The mitral valve is normal in structure. Mild to moderate mitral valve  regurgitation. No evidence of mitral stenosis.   6. The aortic valve is tricuspid. There is mild calcification of the  aortic valve. Aortic valve regurgitation is not visualized. No aortic  stenosis is present.   7. The inferior vena cava is normal in size with greater than 50%  respiratory variability, suggesting right atrial pressure of 3 mmHg.    Cardiac monitor 03/22/2022 personally reviewed 1.  Sinus rhythm - min HR of 46 bpm, max HR of 129 bpm, and avg HR of 64 bpm.  2. Rare PACs 3. PVCs were occasional (2.4%, [id number]) - with one predominant morphology 4. Ventricular Bigeminy and Trigeminy were present  Echo (1/23): EF 30-35%, moderate LV dysfunction, global LV HK, RV mildly reduced, RVSP 30.7 mmHg, mild to mod MR, moderate TR   - R/LHC 08/03/21:  occluded RCA and RCA vein graft with left-to-right collaterals.  The remainder of his grafts are patent.  All his native arteries were occluded.   Hemodynamics: 1: Right atrial pressure-17/17 2: Right ventricular pressure-51/23, mean 27 3: Pulmonary artery pressure-57/9, mean 35 4: Pulmonary wedge pressure-A-wave 28, V wave 24, mean 17 5: Cardiac output-3.7 L/min with an index of 2 L/min/m by Fick 6: LVEDP-24   - Cardiac MRI 08/04/21: Ischemic scar pattern.  LVEF 29% RVEF 41% Mod TR Large posterior pericardial effusion w/o tamponade     Recent Labs: 10/27/2021: Magnesium  2.6 04/16/2022: ALT 23; TSH 3.33 07/02/2022: B Natriuretic Peptide 572.2 08/24/2022: BUN 18; Creatinine, Ser 1.10; Hemoglobin 14.2; Platelets 264; Potassium 4.9; Sodium 140  10/08/2021: Cholesterol 146; HDL 67.70; LDL Cholesterol 65; Total CHOL/HDL Ratio 2; Triglycerides 67.0; VLDL 13.4   CrCl cannot be calculated (Patient's most recent lab result is older than the maximum 21 days allowed.).   Wt Readings from Last 3 Encounters:  09/08/22 160 lb (72.6 kg)  07/02/22 165 lb 9.6 oz (75.1 kg)  05/12/22 160 lb (72.6 kg)     Other studies reviewed: Additional studies/records reviewed today include: summarized above  ASSESSMENT AND PLAN:  ICD ***  ICM Chronic CHF (systolic) ***  CAD ***  PVCs S/p ablation ***  Disposition: F/u with ***  Current medicines are reviewed at length with the patient today.  The patient did not have any concerns regarding medicines.  Venetia Night, PA-C 09/22/2022 7:35 AM     [address] [place] [address] [phone] (office)  [phone] (fax)

## 2022-09-23 ENCOUNTER — Encounter: Payer: Self-pay | Admitting: Physician Assistant

## 2022-09-23 ENCOUNTER — Ambulatory Visit: Payer: BC Managed Care – PPO | Attending: Physician Assistant | Admitting: Physician Assistant

## 2022-09-23 VITALS — BP 120/68 | HR 70 | Ht 67.5 in | Wt 166.4 lb

## 2022-09-23 DIAGNOSIS — I5022 Chronic systolic (congestive) heart failure: Secondary | ICD-10-CM

## 2022-09-23 DIAGNOSIS — I255 Ischemic cardiomyopathy: Secondary | ICD-10-CM | POA: Diagnosis not present

## 2022-09-23 DIAGNOSIS — Z95810 Presence of automatic (implantable) cardiac defibrillator: Secondary | ICD-10-CM

## 2022-09-23 DIAGNOSIS — I493 Ventricular premature depolarization: Secondary | ICD-10-CM

## 2022-09-23 DIAGNOSIS — Z5189 Encounter for other specified aftercare: Secondary | ICD-10-CM

## 2022-09-23 LAB — CUP PACEART INCLINIC DEVICE CHECK
Date Time Interrogation Session: 20240314101647
Implantable Lead Connection Status: 753985
Implantable Lead Implant Date: 20240228
Implantable Lead Location: 753860
Implantable Pulse Generator Implant Date: 20240228
Lead Channel Pacing Threshold Amplitude: 0.75 V
Lead Channel Pacing Threshold Pulse Width: 0.4 ms
Lead Channel Sensing Intrinsic Amplitude: 10.9 mV

## 2022-09-23 NOTE — Patient Instructions (Signed)
Medication Instructions:    Your physician recommends that you continue on your current medications as directed. Please refer to the Current Medication list given to you today.  *If you need a refill on your cardiac medications before your next appointment, please call your pharmacy*   Lab Work:  [place]   If you have labs (blood work) drawn today and your tests are completely normal, you will receive your results only by: [place] (if you have MyChart) OR A paper copy in the mail If you have any lab test that is abnormal or we need to change your treatment, we will call you to review the results.   Testing/Procedures: NONE ORDERED  TODAY   Follow-Up: At [facility], you and your health needs are our priority.  As part of our continuing mission to provide you with exceptional heart care, we have created designated Provider Care Teams.  These Care Teams include your primary Cardiologist (physician) and Advanced Practice Providers (APPs -  Physician Assistants and Nurse Practitioners) who all work together to provide you with the care you need, when you need it.  We recommend signing up for the patient portal called "MyChart".  Sign up information is provided on this After Visit Summary.  MyChart is used to connect with patients for Virtual Visits (Telemedicine).  Patients are able to view lab/test results, encounter notes, upcoming appointments, etc.  Non-urgent messages can be sent to your provider as well.   To learn more about what you can do with MyChart, go to [email or web address].    Your next appointment:  WITH HEART FAILURE  IN [place]   AND DR CAMNITZ AS SCHEDULED   POther Instructions

## 2022-09-26 ENCOUNTER — Other Ambulatory Visit: Payer: Self-pay | Admitting: Family Medicine

## 2022-10-01 DIAGNOSIS — G4733 Obstructive sleep apnea (adult) (pediatric): Secondary | ICD-10-CM | POA: Diagnosis not present

## 2022-10-12 ENCOUNTER — Encounter: Payer: Self-pay | Admitting: Family Medicine

## 2022-10-12 ENCOUNTER — Ambulatory Visit (INDEPENDENT_AMBULATORY_CARE_PROVIDER_SITE_OTHER): Payer: BC Managed Care – PPO | Admitting: Family Medicine

## 2022-10-12 VITALS — BP 100/68 | HR 62 | Temp 97.9°F | Ht 67.5 in | Wt 164.4 lb

## 2022-10-12 DIAGNOSIS — E118 Type 2 diabetes mellitus with unspecified complications: Secondary | ICD-10-CM

## 2022-10-12 DIAGNOSIS — F339 Major depressive disorder, recurrent, unspecified: Secondary | ICD-10-CM | POA: Diagnosis not present

## 2022-10-12 DIAGNOSIS — E1159 Type 2 diabetes mellitus with other circulatory complications: Secondary | ICD-10-CM

## 2022-10-12 DIAGNOSIS — Z0000 Encounter for general adult medical examination without abnormal findings: Secondary | ICD-10-CM

## 2022-10-12 DIAGNOSIS — I5020 Unspecified systolic (congestive) heart failure: Secondary | ICD-10-CM

## 2022-10-12 DIAGNOSIS — K743 Primary biliary cirrhosis: Secondary | ICD-10-CM

## 2022-10-12 DIAGNOSIS — E782 Mixed hyperlipidemia: Secondary | ICD-10-CM

## 2022-10-12 DIAGNOSIS — I152 Hypertension secondary to endocrine disorders: Secondary | ICD-10-CM

## 2022-10-12 DIAGNOSIS — I2510 Atherosclerotic heart disease of native coronary artery without angina pectoris: Secondary | ICD-10-CM

## 2022-10-12 DIAGNOSIS — E039 Hypothyroidism, unspecified: Secondary | ICD-10-CM

## 2022-10-12 LAB — CBC WITH DIFFERENTIAL/PLATELET
Basophils Absolute: 0.1 10*3/uL (ref 0.0–0.1)
Basophils Relative: 0.8 % (ref 0.0–3.0)
Eosinophils Absolute: 0.1 10*3/uL (ref 0.0–0.7)
Eosinophils Relative: 2.2 % (ref 0.0–5.0)
HCT: 42.9 % (ref 39.0–52.0)
Hemoglobin: 14.4 g/dL (ref 13.0–17.0)
Lymphocytes Relative: 24 % (ref 12.0–46.0)
Lymphs Abs: 1.6 10*3/uL (ref 0.7–4.0)
MCHC: 33.4 g/dL (ref 30.0–36.0)
MCV: 97.6 fl (ref 78.0–100.0)
Monocytes Absolute: 0.5 10*3/uL (ref 0.1–1.0)
Monocytes Relative: 7.3 % (ref 3.0–12.0)
Neutro Abs: 4.5 10*3/uL (ref 1.4–7.7)
Neutrophils Relative %: 65.7 % (ref 43.0–77.0)
Platelets: 252 10*3/uL (ref 150.0–400.0)
RBC: 4.4 Mil/uL (ref 4.22–5.81)
RDW: 13.4 % (ref 11.5–15.5)
WBC: 6.8 10*3/uL (ref 4.0–10.5)

## 2022-10-12 LAB — COMPREHENSIVE METABOLIC PANEL
ALT: 29 U/L (ref 0–53)
AST: 25 U/L (ref 0–37)
Albumin: 4.8 g/dL (ref 3.5–5.2)
Alkaline Phosphatase: 134 U/L — ABNORMAL HIGH (ref 39–117)
BUN: 18 mg/dL (ref 6–23)
CO2: 24 mEq/L (ref 19–32)
Calcium: 9.8 mg/dL (ref 8.4–10.5)
Chloride: 104 mEq/L (ref 96–112)
Creatinine, Ser: 1.06 mg/dL (ref 0.40–1.50)
GFR: 77.71 mL/min (ref >60.00)
Glucose, Bld: 104 mg/dL — ABNORMAL HIGH (ref 70–99)
Potassium: 5 mEq/L (ref 3.5–5.1)
Sodium: 136 mEq/L (ref 135–145)
Total Bilirubin: 1 mg/dL (ref 0.2–1.2)
Total Protein: 7.2 g/dL (ref 6.0–8.3)

## 2022-10-12 LAB — MICROALBUMIN / CREATININE URINE RATIO
Creatinine,U: 44.5 mg/dL
Microalb Creat Ratio: 1.6 mg/g (ref 0.0–30.0)
Microalb, Ur: <0.7 mg/dL (ref 0.0–1.9)

## 2022-10-12 LAB — TSH: TSH: 1.87 u[IU]/mL (ref 0.35–5.50)

## 2022-10-12 LAB — LIPID PANEL
Cholesterol: 158 mg/dL (ref 0–200)
HDL: 64.8 mg/dL (ref >39.00)
LDL Cholesterol: 63 mg/dL (ref 0–99)
NonHDL: 93.28
Total CHOL/HDL Ratio: 2
Triglycerides: 151 mg/dL — ABNORMAL HIGH (ref 0.0–149.0)
VLDL: 30.2 mg/dL (ref 0.0–40.0)

## 2022-10-12 LAB — HEMOGLOBIN A1C: Hgb A1c MFr Bld: 6 % (ref 4.6–6.5)

## 2022-10-12 NOTE — Patient Instructions (Signed)
Please return in 6 months to recheck diabetes and blood pressure.  Please set up an appointment for a diabetic eye exam and have the results sent to me. :)  I will release your lab results to you on your MyChart account with further instructions. You may see the results before I do, but when I review them I will send you a message with my report or have my assistant call you if things need to be discussed. Please reply to my message with any questions. Thank you!   If you have any questions or concerns, please don't hesitate to send me a message via MyChart or call the office at [phone]. Thank you for visiting with [place] today! It's our pleasure caring for you.

## 2022-10-12 NOTE — Progress Notes (Signed)
Subjective  Chief Complaint  Patient presents with   Annual Exam    Pt here for annual exam and is currently fasting     HPI: Shane [name] is a 57 y.o. male who presents to [facility] today for a Male Wellness Visit. He also has the concerns and/or needs as listed above in the chief complaint. These will be addressed in addition to the Health Maintenance Visit.   Wellness Visit: annual visit with health maintenance review and exam   Health maintenance: Due annual eye exam.  Colorectal cancer screening is up-to-date.  Healthy lifestyle.  Immunizations current.  Body mass index is 25.37 kg/m. Wt Readings from Last 3 Encounters:  10/12/22 164 lb 6.4 oz (74.6 kg)  09/23/22 166 lb 6.4 oz (75.5 kg)  09/08/22 160 lb (72.6 kg)     Chronic disease management visit and/or acute problem visit: Chronic heart failure: Had recent ICD implant.  Recovering well.  Reviewed cardiology notes.  Stable on multiple heart failure medications.  No shortness of breath chest pain or palpitations.  Fluid status has been normal.  Not needing diuretic.  Blood pressure is tolerating multiple medications.  No lows. Hypertension: Stable as noted above. Hyperlipidemia on Crestor 20 mg nightly tolerating well.  Due for recheck fasting today. Hypothyroidism on levothyroxine 50 mcg daily.  Energy levels are good.  No unwanted weight changes.  Compliant with medications. Chronic depression anxiety, follows along with behavioral list.  Taking medications and reports that things are stable. Primary biliary cholangitis without pain currently.  Monitored by GI.  On Actigall  Patient Active Problem List   Diagnosis Date Noted   Controlled type 2 diabetes mellitus with complication, without long-term current use of insulin 04/16/2022   Hypertension associated with type 2 diabetes mellitus 01/14/2022   Primary biliary cholangitis 10/08/2021   HFrEF (heart failure with reduced ejection  fraction) 08/18/2021   CAD (coronary artery disease) 12/03/2019   OSA on CPAP 09/07/2018   GAD (generalized anxiety disorder) 05/25/2018   Major depression, recurrent, chronic 05/25/2018   S/P CABG (coronary artery bypass graft) 11/16/2017   Mixed hyperlipidemia 05/24/2014   Alcohol use disorder, mild, abuse 03/25/2019   Acquired hypothyroidism 11/16/2017   Macular degeneration    History of Hodgkin's lymphoma 07/13/1995   PVC's (premature ventricular contractions) 09/11/2014   Gynecomastia 04/16/2022   PVC (premature ventricular contraction) 12/23/2021   Mediastinal adenopathy 10/20/2021   LV dysfunction 07/24/2021   Health Maintenance  Topic Date Due   OPHTHALMOLOGY EXAM  Never done   COVID-19 Vaccine (4 - 2023-24 season) 10/28/2022 (Originally 03/12/2022)   HEMOGLOBIN A1C  10/16/2022   Diabetic kidney evaluation - Urine ACR  01/15/2023   FOOT EXAM  01/15/2023   INFLUENZA VACCINE  02/10/2023   Diabetic kidney evaluation - eGFR measurement  08/25/2023   COLONOSCOPY (Pts 45-22yrs Insurance coverage will need to be confirmed)  11/04/2025   DTaP/Tdap/Td (2 - Td or Tdap) 11/17/2027   Hepatitis C Screening  Completed   HIV Screening  Completed   Zoster Vaccines- Shingrix  Completed   HPV VACCINES  Aged Out   Immunization History  Administered Date(s) Administered   Hepatitis A, Adult 10/08/2021, 04/13/2022   Hepatitis B, ADULT 11/12/2021   Hepb-cpg 10/08/2021   Influenza,inj,Quad PF,6+ Mos 04/03/2018, 04/03/2019, 04/10/2021, 04/16/2022   Influenza-Unspecified 03/26/2020   PFIZER Comirnaty(Gray Top)Covid-19 Tri-Sucrose Vaccine 10/30/2020   PFIZER(Purple Top)SARS-COV-2 Vaccination 10/22/2019, 11/12/2019   Tdap 11/16/2017   Zoster Recombinat (Shingrix) 10/09/2020,  04/10/2021   We updated and reviewed the patient's past history in detail and it is documented below. Allergies: Patient has No Known Allergies. Past Medical History  has a past medical history of CAD (coronary  artery disease), s/p stent 2011 and CABG 2015 [facility]), Hodgkin disease (1997), Hyperlipidemia, Macular degeneration, and PVC's (premature ventricular contractions). Past Surgical History Patient  has a past surgical history that includes Coronary artery bypass graft (02/2014); Cardiac catheterization (08/2009); RIGHT/LEFT HEART CATH AND CORONARY ANGIOGRAPHY (N/A, 08/03/2021); PVC ABLATION (N/A, 12/23/2021); and ICD IMPLANT (N/A, 09/08/2022). Social History Patient  reports that he has never smoked. He has quit using smokeless tobacco. He reports current alcohol use of about 3.0 standard drinks of alcohol per week. He reports that he does not use drugs. Family History family history includes COPD in his father and mother; Cancer in his maternal grandmother and mother; Heart Problems in his brother and brother; Heart attack in his father; Heart disease in his father; Stroke in his mother. Review of Systems: Constitutional: negative for fever or malaise Ophthalmic: negative for photophobia, double vision or loss of vision Cardiovascular: negative for chest pain, dyspnea on exertion, or new LE swelling Respiratory: negative for SOB or persistent cough Gastrointestinal: negative for abdominal pain, change in bowel habits or melena Genitourinary: negative for dysuria or gross hematuria Musculoskeletal: negative for new gait disturbance or muscular weakness Integumentary: negative for new or persistent rashes Neurological: negative for TIA or stroke symptoms Psychiatric: negative for SI or delusions Allergic/Immunologic: negative for hives  Patient Care Team    Relationship Specialty Notifications Start End  Leamon Arnt, MD PCP - General Family Medicine  04/16/22   Constance Haw, MD PCP - Electrophysiology Cardiology  09/17/21   Lorretta Harp, MD Consulting Physician Cardiology  11/16/17   Hayden Pedro, MD Consulting Physician Ophthalmology  11/16/17   Volanda Napoleon, MD  Consulting Physician Oncology  11/16/17   Brand Males, MD Consulting Physician Pulmonary Disease  04/16/22   Lenna Sciara, MD Consulting Physician Gastroenterology  04/16/22    Objective  Vitals: BP 100/68   Pulse 62   Temp 97.9 F (36.6 C)   Ht 5' 7.5" (1.715 m)   Wt 164 lb 6.4 oz (74.6 kg)   SpO2 97%   BMI 25.37 kg/m  General:  Well developed, well nourished, no acute distress  Psych:  Alert and orientedx3,normal mood and affect HEENT:  Normocephalic, atraumatic, non-icteric sclera,  oropharynx is clear without mass or exudate, supple neck without adenopathy, or thyromegaly Cardiovascular:  Normal S1, S2, RRR without gallop, rub with soft murmur,  Respiratory:  Good breath sounds bilaterally, CTAB with normal respiratory effort Gastrointestinal: normal bowel sounds, soft, non-tender, no noted masses. No HSM MSK: Joints are without erythema or swelling.  Skin:  Warm, no rashes Neurologic:    Mental status is normal.  Gross motor and sensory exams are normal. Stable gait. No tremor   Assessment  1. Annual physical exam   2. Mixed hyperlipidemia   3. Controlled type 2 diabetes mellitus with complication, without long-term current use of insulin   4. Major depression, recurrent, chronic   5. Coronary artery disease involving native coronary artery of native heart without angina pectoris   6. HFrEF (heart failure with reduced ejection fraction)   7. Primary biliary cholangitis   8. Hypertension associated with type 2 diabetes mellitus   9. Acquired hypothyroidism      Plan  Male Wellness Visit: Age appropriate  Health Maintenance and Prevention measures were discussed with patient. Included topics are cancer screening recommendations, ways to keep healthy (see AVS) including dietary and exercise recommendations, regular eye and dental care, use of seat belts, and avoidance of moderate alcohol use and tobacco use.  BMI: discussed patient's BMI and encouraged positive lifestyle  modifications to help get to or maintain a target BMI. HM needs and immunizations were addressed and ordered. See below for orders. See HM and immunization section for updates. Routine labs and screening tests ordered including cmp, cbc and lipids where appropriate. Discussed recommendations regarding Vit D and calcium supplementation (see AVS)  Chronic disease f/u and/or acute problem visit: (deemed necessary to be done in addition to the wellness visit): Hyperlipidemia on Crestor 20 mg nightly.  Recheck liver tests and lipids today. Chronic heart failure with reduced EF: Status post ICD implant.  On Farxiga 10, spironolactone 25, beta-blocker carvedilol 3.125 twice daily, losartan 25 daily and tolerating well. Type 2 diabetes on metformin XR 500, 2 daily and Farxiga 10 daily.  Denies symptoms of hyperglycemia.  Recheck A1c, and urine nephropathy screen today.  He is on an ARB.  Immunizations are current.  He does need to schedule his eye exam.  No known history of diabetic retinopathy Mood: Continue Abilify 5 and Lexapro 20.  Stable mood.  Continue therapy Hypothyroidism for recheck today to ensure stability on levothyroxine 50 mcg daily.  Clinically euthyroid Primary biliary cholangitis: Follow along with GI.  Stable  Outpatient Encounter Medications as of 10/12/2022  Medication Sig   acetaminophen (TYLENOL) 500 MG tablet Take 500 mg by mouth every 6 (six) hours as needed for headache (pain).   ARIPiprazole (ABILIFY) 5 MG tablet Take 1 tablet (5 mg total) by mouth daily.   aspirin 81 MG tablet Take 81 mg by mouth daily.   Bevacizumab (AVASTIN IV) Inject 1 Dose into the eye See admin instructions. Every six to eight weeks in left eye   carvedilol (COREG) 3.125 MG tablet Take 1 tablet by mouth twice daily.   ciprofloxacin (CILOXAN) 0.3 % ophthalmic solution Place 1 drop into the left eye 4 (four) times daily. After Eye injection every 6-8 weeks for two days   escitalopram (LEXAPRO) 20 MG tablet  Take 1 tablet (20 mg total) by mouth daily.   FARXIGA 10 MG TABS tablet TAKE 1 TABLET(10 MG) BY MOUTH DAILY BEFORE BREAKFAST   levothyroxine (SYNTHROID) 50 MCG tablet Take 1 tablet by mouth daily.   LORazepam (ATIVAN) 0.5 MG tablet Take 1 tablet (0.5 mg total) by mouth 2 (two) times daily as needed for anxiety.   losartan (COZAAR) 25 MG tablet Take 1 tablet (25 mg total) by mouth daily.   metFORMIN (GLUCOPHAGE-XR) 500 MG 24 hr tablet TAKE 2 TABLETS(1000 MG) BY MOUTH DAILY WITH BREAKFAST   nitroGLYCERIN (NITROSTAT) 0.4 MG SL tablet Place 1 tablet (0.4 mg total) under the tongue every 5 (five) minutes as needed for chest pain.   rosuvastatin (CRESTOR) 20 MG tablet Take 1 tablet by mouth daily. **Please call to schedule office visit prior to further refills**   spironolactone (ALDACTONE) 25 MG tablet Take 0.5 tablets (12.5 mg total) by mouth daily.   ursodiol (ACTIGALL) 500 MG tablet Take 500 mg by mouth 2 (two) times daily.   torsemide (DEMADEX) 20 MG tablet Take 20 mg by mouth daily as needed (swelling). (Patient not taking: Reported on 10/12/2022)   No facility-administered encounter medications on file as of 10/12/2022.      Follow  up: 6 months recheck diabetes and hypertension Commons side effects, risks, benefits, and alternatives for medications and treatment plan prescribed today were discussed, and the patient expressed understanding of the given instructions. Patient is instructed to call or message via MyChart if he/she has any questions or concerns regarding our treatment plan. No barriers to understanding were identified. We discussed Red Flag symptoms and signs in detail. Patient expressed understanding regarding what to do in case of urgent or emergency type symptoms.  Medication list was reconciled, printed and provided to the patient in AVS. Patient instructions and summary information was reviewed with the patient as documented in the AVS. This note was prepared with assistance of  Dragon voice recognition software. Occasional wrong-word or sound-a-like substitutions may have occurred due to the inherent limitations of voice recognition software    Orders Placed This Encounter  Procedures   CBC with Differential/Platelet   Comprehensive metabolic panel   Hemoglobin A1c   Lipid panel   TSH   Microalbumin / creatinine urine ratio   No orders of the defined types were placed in this encounter.

## 2022-10-22 DIAGNOSIS — G4733 Obstructive sleep apnea (adult) (pediatric): Secondary | ICD-10-CM | POA: Diagnosis not present

## 2022-10-29 ENCOUNTER — Encounter: Payer: Self-pay | Admitting: Adult Health

## 2022-10-29 ENCOUNTER — Ambulatory Visit (INDEPENDENT_AMBULATORY_CARE_PROVIDER_SITE_OTHER): Payer: BC Managed Care – PPO | Admitting: Adult Health

## 2022-10-29 DIAGNOSIS — F411 Generalized anxiety disorder: Secondary | ICD-10-CM

## 2022-10-29 DIAGNOSIS — F321 Major depressive disorder, single episode, moderate: Secondary | ICD-10-CM | POA: Diagnosis not present

## 2022-10-29 DIAGNOSIS — F39 Unspecified mood [affective] disorder: Secondary | ICD-10-CM

## 2022-10-29 MED ORDER — ARIPIPRAZOLE 5 MG PO TABS: 5.0000 mg | ORAL_TABLET | Freq: Every day | ORAL | 1 refills | Status: DC

## 2022-10-29 MED ORDER — ESCITALOPRAM OXALATE 20 MG PO TABS: 20.0000 mg | ORAL_TABLET | Freq: Every day | ORAL | 1 refills | Status: DC

## 2022-10-29 NOTE — Progress Notes (Signed)
Shane [name] [id number] 1966-07-09 57 y.o.  Subjective:   Patient ID:  Shane [name] is a 57 y.o. (DOB 01/19/1966) male.  Chief Complaint: No chief complaint on file.   HPI Shane [name] presents to the office today for follow-up of GAD, MDD, insomnia, mood disorder, panic attacks.   Describes mood today as "ok". Pleasant. Denies tearfulness.Mood symptoms - denies depression, anxiety, and irritability. Denies panic attacks. Mood is consistent. Stating "I'm doing great". Feels like medications continue to work well. He and wife doing well. Stable interest and motivation. Taking medications as prescribed.  Energy levels stable. Active, does not have a  regular exercise routine. Enjoys some usual interests and activities. Married. Lives with wife. Adopted 3 kittens. Has 2 grown sons. Family in [place].  Appetite adequate. Weight gain 153 to 160 pounds.  Sleeps well most nights. Averages 7 hours. Using CPAP machine more often. Focus and concentration stable. Completing tasks. Managing aspects of household. Work going well [employer]. Denies SI or HI.  Denies AH or VH. Denies self harm. Denies substance use.   GAD-7    Flowsheet Row Office Visit from 10/12/2022 in [facility] Visit from 05/24/2018 in [facility] from 04/03/2018 in [facility]  Total GAD-7 Score 0 2 9      PHQ2-9    Flowsheet Row Office Visit from 10/12/2022 in [facility] Office Visit from 04/16/2022 in [facility] from 01/14/2022 in [facility] from 10/08/2021 in [facility] from 04/10/2021 in [facility]  PHQ-2 Total Score 0 0 0 0 0  PHQ-9 Total Score -- -- -- 2 2      Flowsheet Row Admission (Discharged) from 09/08/2022 in [facility] CARDIAC  CATH LAB Admission (Discharged) from 12/23/2021 in [facility] 4E CV SURGICAL PROGRESSIVE CARE Admission (Discharged) from 08/03/2021 in [facility] 6E Progressive Care  C-SSRS RISK CATEGORY No Risk No Risk No Risk        Review of Systems:  Review of Systems  Musculoskeletal:  Negative for gait problem.  Neurological:  Negative for tremors.  Psychiatric/Behavioral:         Please refer to HPI    Medications: I have reviewed the patient's current medications.  Current Outpatient Medications  Medication Sig Dispense Refill   acetaminophen (TYLENOL) 500 MG tablet Take 500 mg by mouth every 6 (six) hours as needed for headache (pain).     ARIPiprazole (ABILIFY) 5 MG tablet Take 1 tablet (5 mg total) by mouth daily. 90 tablet 1   aspirin 81 MG tablet Take 81 mg by mouth daily.     Bevacizumab (AVASTIN IV) Inject 1 Dose into the eye See admin instructions. Every six to eight weeks in left eye     carvedilol (COREG) 3.125 MG tablet Take 1 tablet by mouth twice daily. 180 tablet 2   ciprofloxacin (CILOXAN) 0.3 % ophthalmic solution Place 1 drop into the left eye 4 (four) times daily. After Eye injection every 6-8 weeks for two days     escitalopram (LEXAPRO) 20 MG tablet Take 1 tablet (20 mg total) by mouth daily. 90 tablet 1   FARXIGA 10 MG TABS tablet TAKE 1 TABLET(10 MG) BY MOUTH DAILY BEFORE BREAKFAST 30 tablet 11   levothyroxine (SYNTHROID) 50 MCG tablet Take 1 tablet by mouth daily. 90 tablet 0   LORazepam (ATIVAN) 0.5  MG tablet Take 1 tablet (0.5 mg total) by mouth 2 (two) times daily as needed for anxiety. 60 tablet 2   losartan (COZAAR) 25 MG tablet Take 1 tablet (25 mg total) by mouth daily. 90 tablet 3   metFORMIN (GLUCOPHAGE-XR) 500 MG 24 hr tablet TAKE 2 TABLETS(1000 MG) BY MOUTH DAILY WITH BREAKFAST 90 tablet 3   nitroGLYCERIN (NITROSTAT) 0.4 MG SL tablet Place 1 tablet (0.4 mg total) under the tongue every 5 (five) minutes as needed for chest pain. 25 tablet 6   rosuvastatin (CRESTOR) 20  MG tablet Take 1 tablet by mouth daily. **Please call to schedule office visit prior to further refills** 90 tablet 3   spironolactone (ALDACTONE) 25 MG tablet Take 0.5 tablets (12.5 mg total) by mouth daily. 15 tablet 6   torsemide (DEMADEX) 20 MG tablet Take 20 mg by mouth daily as needed (swelling). (Patient not taking: Reported on 10/12/2022)     ursodiol (ACTIGALL) 500 MG tablet Take 500 mg by mouth 2 (two) times daily.     No current facility-administered medications for this visit.    Medication Side Effects: None  Allergies: No Known Allergies  Past Medical History:  Diagnosis Date   CAD (coronary artery disease), s/p stent 2011 and CABG 2015 [facility])    Hodgkin disease 1997   Hyperlipidemia    Macular degeneration    PVC's (premature ventricular contractions)     Past Medical History, Surgical history, Social history, and Family history were reviewed and updated as appropriate.   Please see review of systems for further details on the patient's review from today.   Objective:   Physical Exam:  There were no vitals taken for this visit.  Physical Exam Constitutional:      General: He is not in acute distress. Musculoskeletal:        General: No deformity.  Neurological:     Mental Status: He is alert and oriented to person, place, and time.     Coordination: Coordination normal.  Psychiatric:        Attention and Perception: Attention and perception normal. He does not perceive auditory or visual hallucinations.        Mood and Affect: Mood normal. Mood is not anxious or depressed. Affect is not labile, blunt, angry or inappropriate.        Speech: Speech normal.        Behavior: Behavior normal.        Thought Content: Thought content normal. Thought content is not paranoid or delusional. Thought content does not include homicidal or suicidal ideation. Thought content does not include homicidal or suicidal plan.        Cognition and Memory: Cognition  and memory normal.        Judgment: Judgment normal.     Comments: Insight intact     Lab Review:     Component Value Date/Time   NA 136 10/12/2022 0953   NA 140 08/24/2022 1217   NA 141 12/24/2015 1051   K 5.0 10/12/2022 0953   K 4.6 12/24/2015 1051   CL 104 10/12/2022 0953   CO2 24 10/12/2022 0953   CO2 28 12/24/2015 1051   GLUCOSE 104 (H) 10/12/2022 0953   GLUCOSE 95 12/24/2015 1051   BUN 18 10/12/2022 0953   BUN 18 08/24/2022 1217   BUN 14.6 12/24/2015 1051   CREATININE 1.06 10/12/2022 0953   CREATININE 1.1 12/24/2015 1051   CALCIUM 9.8 10/12/2022 0953   CALCIUM 9.8 12/24/2015  1051   PROT 7.2 10/12/2022 0953   PROT 6.8 07/24/2021 1131   PROT 7.6 12/24/2015 1051   ALBUMIN 4.8 10/12/2022 0953   ALBUMIN 4.3 07/24/2021 1131   ALBUMIN 4.5 12/24/2015 1051   AST 25 10/12/2022 0953   AST 25 12/24/2015 1051   ALT 29 10/12/2022 0953   ALT 34 12/24/2015 1051   ALKPHOS 134 (H) 10/12/2022 0953   ALKPHOS 45 12/24/2015 1051   BILITOT 1.0 10/12/2022 0953   BILITOT 1.7 (H) 07/24/2021 1131   BILITOT 0.99 12/24/2015 1051   GFRNONAA >60 07/02/2022 1048       Component Value Date/Time   WBC 6.8 10/12/2022 0953   RBC 4.40 10/12/2022 0953   HGB 14.4 10/12/2022 0953   HGB 14.2 08/24/2022 1217   HGB 15.7 12/24/2015 1050   HCT 42.9 10/12/2022 0953   HCT 42.0 08/24/2022 1217   HCT 44.7 12/24/2015 1050   PLT 252.0 10/12/2022 0953   PLT 264 08/24/2022 1217   MCV 97.6 10/12/2022 0953   MCV 95 08/24/2022 1217   MCV 93 12/24/2015 1050   MCH 32.1 08/24/2022 1217   MCH 30.1 08/04/2021 0247   MCHC 33.4 10/12/2022 0953   RDW 13.4 10/12/2022 0953   RDW 12.6 08/24/2022 1217   RDW 12.9 12/24/2015 1050   LYMPHSABS 1.6 10/12/2022 0953   LYMPHSABS 1.3 12/24/2015 1050   MONOABS 0.5 10/12/2022 0953   EOSABS 0.1 10/12/2022 0953   EOSABS 0.1 12/24/2015 1050   BASOSABS 0.1 10/12/2022 0953   BASOSABS 0.0 12/24/2015 1050    No results found for: "POCLITH", "LITHIUM"   No results found  for: "PHENYTOIN", "PHENOBARB", "VALPROATE", "CBMZ"   .res Assessment: Plan:    Plan:  Lexapro  daily Ativan 0.5mg  BID - uses twice a month - none needed Abilify  daily.  Continue therapy with Rockne Menghini.   RTC 6 months  Patient advised to contact office with any questions, adverse effects, or acute worsening in signs and symptoms.  Discussed potential benefits, risk, and side effects of benzodiazepines to include potential risk of tolerance and dependence, as well as possible drowsiness.  Advised patient not to drive if experiencing drowsiness and to take lowest possible effective dose to minimize risk of dependence and tolerance.   Discussed potential metabolic side effects associated with atypical antipsychotics, as well as potential risk for movement side effects. Advised pt to contact office if movement side effects occur.   Diagnoses and all orders for this visit:  Generalized anxiety disorder -     ARIPiprazole (ABILIFY) 5 MG tablet; Take 1 tablet (5 mg total) by mouth daily.  Episodic mood disorder -     ARIPiprazole (ABILIFY) 5 MG tablet; Take 1 tablet (5 mg total) by mouth daily.  Depression, major, single episode, moderate Comments: Marriage issues. Wife with mood disorder. Orders: -     escitalopram (LEXAPRO) 20 MG tablet; Take 1 tablet (20 mg total) by mouth daily.     Please see After Visit Summary for patient specific instructions.  Future Appointments  Date Time Provider Department Center  11/15/2022  8:50 AM Sherrie George, MD [facility] None  11/17/2022 11:00 AM Mathis Fare, LCSW [facility] None  12/10/2022  1:45 PM Regan Lemming, MD [facility]  12/17/2022  7:05 AM [facility] DEVICE REMOTES [facility]  03/18/2023  7:05 AM [facility] DEVICE REMOTES [facility]  04/12/2023 11:30 AM Willow Ora, MD [facility]  06/17/2023  7:05 AM [facility] DEVICE REMOTES [facility]  09/16/2023  7:05 AM  [facility] DEVICE REMOTES [facility]  12/16/2023  7:05 AM [facility] DEVICE REMOTES [facility]  03/16/2024  7:05 AM [facility] DEVICE REMOTES [facility]  06/15/2024  7:05 AM [facility] DEVICE REMOTES [facility]  09/14/2024  7:05 AM [facility] DEVICE REMOTES [facility]    No orders of the defined types were placed in this encounter.   -------------------------------

## 2022-11-15 ENCOUNTER — Encounter (INDEPENDENT_AMBULATORY_CARE_PROVIDER_SITE_OTHER): Payer: BC Managed Care – PPO | Admitting: Ophthalmology

## 2022-11-15 DIAGNOSIS — H348322 Tributary (branch) retinal vein occlusion, left eye, stable: Secondary | ICD-10-CM

## 2022-11-15 DIAGNOSIS — I10 Essential (primary) hypertension: Secondary | ICD-10-CM | POA: Diagnosis not present

## 2022-11-15 DIAGNOSIS — H353221 Exudative age-related macular degeneration, left eye, with active choroidal neovascularization: Secondary | ICD-10-CM

## 2022-11-15 DIAGNOSIS — H35033 Hypertensive retinopathy, bilateral: Secondary | ICD-10-CM

## 2022-11-15 DIAGNOSIS — H43813 Vitreous degeneration, bilateral: Secondary | ICD-10-CM

## 2022-11-17 ENCOUNTER — Ambulatory Visit (INDEPENDENT_AMBULATORY_CARE_PROVIDER_SITE_OTHER): Payer: BC Managed Care – PPO | Admitting: Psychiatry

## 2022-11-17 DIAGNOSIS — F411 Generalized anxiety disorder: Secondary | ICD-10-CM

## 2022-11-17 NOTE — Progress Notes (Signed)
[facility] Counselor/Therapist Progress Note  Patient ID: Shane [name], MRN: [id number],    Date: 11/17/2022  Time Spent: 50 minutes   Treatment Type: Individual Therapy  Reported Symptoms: anxiety  Mental Status Exam:  Appearance:   Casual     Behavior:  Appropriate, Sharing, and Motivated  Motor:  Normal  Speech/Language:   Clear and Coherent  Affect:  anxious  Mood:  anxious  Thought process:  goal directed  Thought content:    WNL  Sensory/Perceptual disturbances:    WNL  Orientation:  oriented to person, place, time/date, situation, day of week, month of year, year, and stated date of Nov 17, 2022  Attention:  Good  Concentration:  Good  Memory:  WNL  Fund of knowledge:   Good  Insight:    Good  Judgment:   Good  Impulse Control:  Good   Risk Assessment: Danger to Self:  No Self-injurious Behavior: No Danger to Others: No Duty to Warn:no Physical Aggression / Violence:No  Access to Firearms a concern: No  Gang Involvement:No   Subjective:   Patient in today reporting anxiety mostly related to work stressors including new work that he's not done before. No depression. Sleep is good and using his C-PAP machine at least every other night. Stress management better at work and at home. Still focused on trying not to stress about the future and especially about things he cannot control. Is doing ok after having defibrillator implanted. Has been concerned about intimacy issues as he has "zero drive" and thinks it could be related to meds and I've encouraged patient to check with his prescribing physicians. Needed session today to process some sensitive personal/marital issues, and patient seemed to benefit from this. Wife has mentioned the lack of intimacy to patient. Issues with adult children especially a son living in [place]. (Not all details included in this note due to patient privacy needs.)  Other son, out of college and took a job in [place] working with [employer]. Contemplating eventual retirement.   Interventions: Cognitive Behavioral Therapy and Ego-Supportive  Long Term Goal: Enhance the ability to handle effectively his depression and the full variety of life's anxieties.  Short Term Goal:  Verbalize an understanding of how thoughts, physical feelings, and behavioral actions contribute to anxiety and anger and its treatment. Strategy:  Patient will work to recognize and interrupt triggers to his anxious or depressive thoughts  so as to replace with positive, more reality-based thoughts, which lead to more positive actions/behaviors.  Diagnosis:   ICD-10-CM   1. Generalized anxiety disorder  F41.1      Plan:  Patient in session today showing good participation and motivation, as he focused further on his anxiety, marital-related concerns, and some issues involving extended family and setting of minutes as needed.  Also encouraged patient to continue with some of the CBT strategies we have worked with before that were helpful to him.  Patient has made and maintained a good bit of his progress as he continues to work with goal-directed behaviors to keep moving in a forward direction. Encouraged patient and practicing more self affirming and positive behaviors as noted in session including: Staying focused in the present and on the things that he can control versus cannot control, use of deep breathing exercises as noted in session to help with his anxiety, practicing more active listening skills with his wife and others, consistently practice positive self talk, be aware of positives each day versus  the negatives, stay on medications as prescribed, remain in contact with supportive people, participate as actively as possible and outside activities that he enjoys, and recognize the strength he shows working with goal-directed behaviors to move in a direction that supports his improved emotional health and overall wellbeing.  Review and  progress/challenges noted with patient.  Next appointment within approximately 4 to 5 weeks.   Mathis Fare, LCSW

## 2022-11-19 ENCOUNTER — Other Ambulatory Visit: Payer: Self-pay | Admitting: *Deleted

## 2022-11-19 MED ORDER — SPIRONOLACTONE 25 MG PO TABS: 12.5000 mg | ORAL_TABLET | Freq: Every day | ORAL | 3 refills | Status: DC

## 2022-12-10 ENCOUNTER — Ambulatory Visit: Payer: BC Managed Care – PPO | Admitting: Cardiology

## 2022-12-17 ENCOUNTER — Ambulatory Visit (INDEPENDENT_AMBULATORY_CARE_PROVIDER_SITE_OTHER): Payer: BC Managed Care – PPO

## 2022-12-17 DIAGNOSIS — I255 Ischemic cardiomyopathy: Secondary | ICD-10-CM

## 2022-12-17 LAB — CUP PACEART REMOTE DEVICE CHECK
Battery Remaining Longevity: 168 mo
Battery Voltage: 3.1 V
Brady Statistic RA Percent Paced: INVALID
Brady Statistic RV Percent Paced: 0.07 %
Date Time Interrogation Session: 20240606191343
HighPow Impedance: 65 Ohm
Implantable Lead Connection Status: 753985
Implantable Lead Implant Date: 20240228
Implantable Lead Location: 753860
Implantable Pulse Generator Implant Date: 20240228
Lead Channel Impedance Value: 323 Ohm
Lead Channel Impedance Value: 380 Ohm
Lead Channel Pacing Threshold Amplitude: 0.625 V
Lead Channel Pacing Threshold Pulse Width: 0.4 ms
Lead Channel Sensing Intrinsic Amplitude: 11.3 mV
Lead Channel Setting Pacing Amplitude: 2 V
Lead Channel Setting Pacing Pulse Width: 0.4 ms
Lead Channel Setting Sensing Sensitivity: 0.3 mV
Zone Setting Status: 755011

## 2022-12-21 DIAGNOSIS — B353 Tinea pedis: Secondary | ICD-10-CM | POA: Diagnosis not present

## 2022-12-21 DIAGNOSIS — D225 Melanocytic nevi of trunk: Secondary | ICD-10-CM | POA: Diagnosis not present

## 2022-12-21 DIAGNOSIS — D2271 Melanocytic nevi of right lower limb, including hip: Secondary | ICD-10-CM | POA: Diagnosis not present

## 2022-12-21 DIAGNOSIS — C44519 Basal cell carcinoma of skin of other part of trunk: Secondary | ICD-10-CM | POA: Diagnosis not present

## 2022-12-21 DIAGNOSIS — D2261 Melanocytic nevi of right upper limb, including shoulder: Secondary | ICD-10-CM | POA: Diagnosis not present

## 2022-12-21 DIAGNOSIS — L814 Other melanin hyperpigmentation: Secondary | ICD-10-CM | POA: Diagnosis not present

## 2022-12-22 ENCOUNTER — Ambulatory Visit: Payer: BC Managed Care – PPO | Attending: Cardiology | Admitting: Cardiology

## 2022-12-22 ENCOUNTER — Encounter: Payer: Self-pay | Admitting: Cardiology

## 2022-12-22 VITALS — BP 108/56 | HR 65 | Ht 67.5 in | Wt 158.0 lb

## 2022-12-22 DIAGNOSIS — I493 Ventricular premature depolarization: Secondary | ICD-10-CM | POA: Diagnosis not present

## 2022-12-22 DIAGNOSIS — I2510 Atherosclerotic heart disease of native coronary artery without angina pectoris: Secondary | ICD-10-CM | POA: Diagnosis not present

## 2022-12-22 DIAGNOSIS — I5022 Chronic systolic (congestive) heart failure: Secondary | ICD-10-CM | POA: Diagnosis not present

## 2022-12-22 NOTE — Progress Notes (Signed)
  Electrophysiology Office Note:   Date:  12/22/2022  ID:  YANUEL TAGG, DOB 11/14/1965, MRN [id number]  Primary Cardiologist: None Electrophysiologist: Ifeoma Vallin Jorja Loa, MD      History of Present Illness:   Shane [name] is a 57 y.o. male with h/o systolic heart failure due to ischemic cardiomyopathy, coronary artery disease post CABG, PVCs, Hodgkin's lymphoma postchemotherapy and radiation seen today for routine electrophysiology followup.  Since last being seen in our clinic the patient reports doing well.  He has no chest pain or shortness of breath.  Since his device was implanted, he has been able to do all of his daily activities without restriction.  he denies chest pain, palpitations, dyspnea, PND, orthopnea, nausea, vomiting, dizziness, syncope, edema, weight gain, or early satiety.   Review of systems complete and found to be negative unless listed in HPI.   Device History: Medtronic Single Chamber ICD implanted 09/08/2022 for chronic systolic heart failure History of appropriate therapy: No History of AAD therapy: No    Studies Reviewed:    ICD Interrogation-  reviewed in detail today,  See PACEART report.  EKG is ordered today. Personal review shows sinus rhythm, PVC  Risk Assessment/Calculations:              Physical Exam:   VS:  BP (!) 108/56   Pulse 65   Ht 5' 7.5" (1.715 m)   Wt 158 lb (71.7 kg)   SpO2 95%   BMI 24.38 kg/m    Wt Readings from Last 3 Encounters:  12/22/22 158 lb (71.7 kg)  10/12/22 164 lb 6.4 oz (74.6 kg)  09/23/22 166 lb 6.4 oz (75.5 kg)     GEN: Well nourished, well developed in no acute distress NECK: No JVD; No carotid bruits CARDIAC: Regular rate and rhythm, no murmurs, rubs, gallops RESPIRATORY:  Clear to auscultation without rales, wheezing or rhonchi  ABDOMEN: Soft, non-tender, non-distended EXTREMITIES:  No edema; No deformity   ASSESSMENT AND PLAN:    Chronic systolic dysfunction s/p Medtronic single chamber  ICD  euvolemic today Stable on an appropriate medical regimen Normal ICD function See Pace Art report No changes today   2.  PVCs: Status post ablation 12/23/2021.  Single PVC noted on EKG today.  Burden is overall reduced.  Continue with current management.  3.  Coronary disease: Status post CABG.  No current chest pain.  Disposition:   Follow up with EP APP in 12 months   Signed, Rebeckah Masih Jorja Loa, MD

## 2022-12-25 ENCOUNTER — Other Ambulatory Visit: Payer: Self-pay | Admitting: Family Medicine

## 2022-12-27 ENCOUNTER — Encounter (INDEPENDENT_AMBULATORY_CARE_PROVIDER_SITE_OTHER): Payer: BC Managed Care – PPO | Admitting: Ophthalmology

## 2022-12-29 ENCOUNTER — Encounter (HOSPITAL_COMMUNITY): Payer: BC Managed Care – PPO

## 2022-12-30 ENCOUNTER — Encounter (HOSPITAL_COMMUNITY): Payer: BC Managed Care – PPO

## 2023-01-01 ENCOUNTER — Other Ambulatory Visit: Payer: Self-pay | Admitting: Adult Health

## 2023-01-03 NOTE — Progress Notes (Signed)
Remote ICD transmission.   

## 2023-01-04 DIAGNOSIS — C44519 Basal cell carcinoma of skin of other part of trunk: Secondary | ICD-10-CM | POA: Diagnosis not present

## 2023-01-10 ENCOUNTER — Encounter (INDEPENDENT_AMBULATORY_CARE_PROVIDER_SITE_OTHER): Payer: BC Managed Care – PPO | Admitting: Ophthalmology

## 2023-01-10 ENCOUNTER — Encounter (INDEPENDENT_AMBULATORY_CARE_PROVIDER_SITE_OTHER): Payer: Self-pay

## 2023-01-10 DIAGNOSIS — H2513 Age-related nuclear cataract, bilateral: Secondary | ICD-10-CM

## 2023-01-10 DIAGNOSIS — H348322 Tributary (branch) retinal vein occlusion, left eye, stable: Secondary | ICD-10-CM

## 2023-01-10 DIAGNOSIS — H35033 Hypertensive retinopathy, bilateral: Secondary | ICD-10-CM | POA: Diagnosis not present

## 2023-01-10 DIAGNOSIS — H43813 Vitreous degeneration, bilateral: Secondary | ICD-10-CM

## 2023-01-10 DIAGNOSIS — I10 Essential (primary) hypertension: Secondary | ICD-10-CM | POA: Diagnosis not present

## 2023-01-10 DIAGNOSIS — H353221 Exudative age-related macular degeneration, left eye, with active choroidal neovascularization: Secondary | ICD-10-CM

## 2023-01-11 ENCOUNTER — Ambulatory Visit: Payer: BC Managed Care – PPO | Admitting: Psychiatry

## 2023-01-12 ENCOUNTER — Ambulatory Visit (HOSPITAL_COMMUNITY)
Admission: RE | Admit: 2023-01-12 | Discharge: 2023-01-12 | Disposition: A | Discharge status: Home / Self Care | Payer: BC Managed Care – PPO | Source: Ambulatory Visit | Attending: Family Medicine | Admitting: Family Medicine

## 2023-01-12 ENCOUNTER — Encounter (HOSPITAL_COMMUNITY): Payer: Self-pay

## 2023-01-12 VITALS — BP 90/60 | HR 66 | Wt 161.0 lb

## 2023-01-12 DIAGNOSIS — I255 Ischemic cardiomyopathy: Secondary | ICD-10-CM | POA: Insufficient documentation

## 2023-01-12 DIAGNOSIS — G4733 Obstructive sleep apnea (adult) (pediatric): Secondary | ICD-10-CM | POA: Diagnosis not present

## 2023-01-12 DIAGNOSIS — Z7982 Long term (current) use of aspirin: Secondary | ICD-10-CM | POA: Insufficient documentation

## 2023-01-12 DIAGNOSIS — I3139 Other pericardial effusion (noninflammatory): Secondary | ICD-10-CM | POA: Insufficient documentation

## 2023-01-12 DIAGNOSIS — E119 Type 2 diabetes mellitus without complications: Secondary | ICD-10-CM | POA: Diagnosis not present

## 2023-01-12 DIAGNOSIS — I493 Ventricular premature depolarization: Secondary | ICD-10-CM | POA: Diagnosis not present

## 2023-01-12 DIAGNOSIS — Z9221 Personal history of antineoplastic chemotherapy: Secondary | ICD-10-CM | POA: Diagnosis not present

## 2023-01-12 DIAGNOSIS — Z7984 Long term (current) use of oral hypoglycemic drugs: Secondary | ICD-10-CM | POA: Diagnosis not present

## 2023-01-12 DIAGNOSIS — I252 Old myocardial infarction: Secondary | ICD-10-CM | POA: Insufficient documentation

## 2023-01-12 DIAGNOSIS — Z95810 Presence of automatic (implantable) cardiac defibrillator: Secondary | ICD-10-CM | POA: Diagnosis not present

## 2023-01-12 DIAGNOSIS — Z955 Presence of coronary angioplasty implant and graft: Secondary | ICD-10-CM | POA: Insufficient documentation

## 2023-01-12 DIAGNOSIS — Z8572 Personal history of non-Hodgkin lymphomas: Secondary | ICD-10-CM | POA: Diagnosis not present

## 2023-01-12 DIAGNOSIS — I5022 Chronic systolic (congestive) heart failure: Secondary | ICD-10-CM | POA: Diagnosis not present

## 2023-01-12 DIAGNOSIS — R42 Dizziness and giddiness: Secondary | ICD-10-CM | POA: Insufficient documentation

## 2023-01-12 DIAGNOSIS — Z79899 Other long term (current) drug therapy: Secondary | ICD-10-CM | POA: Diagnosis not present

## 2023-01-12 DIAGNOSIS — I2510 Atherosclerotic heart disease of native coronary artery without angina pectoris: Secondary | ICD-10-CM | POA: Diagnosis not present

## 2023-01-12 DIAGNOSIS — Z923 Personal history of irradiation: Secondary | ICD-10-CM | POA: Diagnosis not present

## 2023-01-12 DIAGNOSIS — Z8249 Family history of ischemic heart disease and other diseases of the circulatory system: Secondary | ICD-10-CM | POA: Diagnosis not present

## 2023-01-12 DIAGNOSIS — I110 Hypertensive heart disease with heart failure: Secondary | ICD-10-CM | POA: Diagnosis not present

## 2023-01-12 DIAGNOSIS — Z823 Family history of stroke: Secondary | ICD-10-CM | POA: Diagnosis not present

## 2023-01-12 DIAGNOSIS — I10 Essential (primary) hypertension: Secondary | ICD-10-CM

## 2023-01-12 LAB — BASIC METABOLIC PANEL
Anion gap: 13 (ref 5–15)
BUN: 17 mg/dL (ref 6–20)
CO2: 18 mmol/L — ABNORMAL LOW (ref 22–32)
Calcium: 9.3 mg/dL (ref 8.9–10.3)
Chloride: 105 mmol/L (ref 98–111)
Creatinine, Ser: 1.13 mg/dL (ref 0.61–1.24)
GFR, Estimated: >60 mL/min (ref >60)
Glucose, Bld: 77 mg/dL (ref 70–99)
Potassium: 4.6 mmol/L (ref 3.5–5.1)
Sodium: 136 mmol/L (ref 135–145)

## 2023-01-12 LAB — BRAIN NATRIURETIC PEPTIDE: B Natriuretic Peptide: 352.7 pg/mL — ABNORMAL HIGH (ref 0.0–100.0)

## 2023-01-12 NOTE — Progress Notes (Signed)
[facility] PROGRESS NOTE  Primary Care: Willow Ora, MD General Cardiologist: Dr. Allyson Sabal HF Cardiologist: Dr. Gala Romney   Reason for Visit: F/u chronic systolic heart failure  HPI: Shane [name] is a 57 y.o. 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   Echo at time of CABG showed moderately reduced LVEF, 30-35%. EF improved post revascularization and had normalized, back to 50-55%, on repeat echo in 2016. In 2017, he underwent cardiac w/u for decrease exercise tolerance. Echo w/ normal LVEF, 50-55%, no significant valvular diease.    Echo 1/23 EF back down to 30-35%, RV mildly reduced, mild-mod MR. Mod TR.    R/LHC on 1/23 occluded native RCA and RCA vein graft with left-to-right collaterals. The remainder of his grafts were patent.  All his native arteries were occluded. RHC showed elevated right sided filling pressures and marginal CO (see hemodynamics below). He was direct admitted for IV diuretics and AHF consultation. He was also observed to have frequent PVCs ~25/min and previous records showed where prior Zio monitor had also showed high burden PVCs ~13%. He was placed on mexiletine for suppression but c/w frequent PVCs, was later switched to amio 200 mg bid. cMRI EF 29% c/w ischemic CM w/ coronary disease pattern LGE update/ scar. Also noted to have large posterior pericardial effusion w/o tamponade as well as moderate TR.   He had repeat Zio 2/23 while on amio + mexiletine. This showed reduction in PVC burden from 13 to 8.8%. He was subsequently referred to EP for PVC ablation. Seen by Dr. Elberta Fortis. Given concern for side effects w/ long term use of amio + pt preference, amiodarone was discontinued and mexiletine was increased to 300 mg bid. Dr. Elberta Fortis recommended repeat Zio to re quantify burden on monotherapy. Zio 6/23 19.1% PVCs. Underwent  PVC ablation 6/23  Following with Dr. Tresa Endo. Now on Bipap.   Dr. Tresa Endo following for OSA. AHI 18.1 started on bipap  Echo 11/10/21 EF 30-35%  Zio 9/23: 2.4% PVCs  Echo 9/23 EF 25-30%, mild LVH, grade 2 DD, RV mildly reduced  Zio 9/23 showed mostly NSR, rare PACs, 2.4% PVC burden  S/p MDT ICD placement 2/24.  Today he returns for HF follow up. Overall feeling fine. Occasional positional dizziness but no falls. He is not SOB with activity. Planning to go to the beach for the 4th for a week. Denies palpitations, CP,  edema, or PND/Orthopnea. Appetite ok. No fever or chills. Weight at home 158-160 pounds. Taking all medications, has not needed torsemide. Not wearing BiPap.  Cardiac Studies  - Zio (9/23): mostly NSR, rare PACs, 2.4% PVC burden  - Echo (9/23): EF 25-30%, mild LVH, grade 2 DD, RV mildly reduced  - Zio (2/23) - PVC burden 8.8%   - Echo (1/23): EF 30-35%, moderate LV dysfunction, global LV HK, RV mildly reduced, RVSP 30.7 mmHg, mild to mod MR, moderate TR   - R/LHC (1/23): occluded RCA and RCA vein graft with left-to-right collaterals.  The remainder of his grafts are patent.  All his native arteries were occluded.   Hemodynamics: 1: Right atrial pressure-17/17 2: Right ventricular pressure-51/23, mean 27 3: Pulmonary artery pressure-57/9, mean 35 4: Pulmonary wedge pressure-A-wave 28, V wave 24, mean 17 5: Cardiac output-3.7 L/min with an index of 2 L/min/m by Fick 6: LVEDP - 24  -  Cardiac MRI (08/04/21): Ischemic scar pattern.  LVEF 29% RVEF 41% Mod TR Large posterior pericardial effusion w/o tamponade   Past Medical History:  Diagnosis Date   CAD (coronary artery disease), s/p stent 2011 and CABG 2015 [facility])    Hodgkin disease  [facility]) 1997   Hyperlipidemia    Macular degeneration    PVC's (premature ventricular contractions)    Current Outpatient Medications  Medication Sig Dispense Refill   acetaminophen (TYLENOL) 500 MG tablet Take 500 mg  by mouth every 6 (six) hours as needed for headache (pain).     ARIPiprazole (ABILIFY) 5 MG tablet Take 1 tablet (5 mg total) by mouth daily. 90 tablet 1   aspirin 81 MG tablet Take 81 mg by mouth daily.     Bevacizumab (AVASTIN IV) Inject 1 Dose into the eye See admin instructions. Every six to eight weeks in left eye     carvedilol (COREG) 3.125 MG tablet Take 1 tablet by mouth twice daily. 180 tablet 2   ciprofloxacin (CILOXAN) 0.3 % ophthalmic solution Place 1 drop into the left eye 4 (four) times daily. After Eye injection every 6-8 weeks for two days     escitalopram (LEXAPRO) 20 MG tablet Take 1 tablet (20 mg total) by mouth daily. 90 tablet 1   FARXIGA 10 MG TABS tablet TAKE 1 TABLET(10 MG) BY MOUTH DAILY BEFORE BREAKFAST 30 tablet 11   levothyroxine (SYNTHROID) 50 MCG tablet Take 1 tablet by mouth daily. 90 tablet 0   LORazepam (ATIVAN) 0.5 MG tablet Take 1 tablet (0.5 mg total) by mouth 2 (two) times daily as needed for anxiety. 60 tablet 2   losartan (COZAAR) 25 MG tablet Take 1 tablet (25 mg total) by mouth daily. 90 tablet 3   metFORMIN (GLUCOPHAGE-XR) 500 MG 24 hr tablet TAKE 2 TABLETS(1000 MG) BY MOUTH DAILY WITH BREAKFAST 90 tablet 3   nitroGLYCERIN (NITROSTAT) 0.4 MG SL tablet Place 1 tablet (0.4 mg total) under the tongue every 5 (five) minutes as needed for chest pain. 25 tablet 6   rosuvastatin (CRESTOR) 20 MG tablet Take 1 tablet by mouth daily. **Please call to schedule office visit prior to further refills** 90 tablet 3   spironolactone (ALDACTONE) 25 MG tablet TAKE 1/2 TABLET(12.5 MG) BY MOUTH AT BEDTIME 15 tablet 3   torsemide (DEMADEX) 20 MG tablet Take 20 mg by mouth daily as needed (swelling).     ursodiol (ACTIGALL) 500 MG tablet Take 500 mg by mouth 2 (two) times daily.     No current facility-administered medications for this encounter.   No Known Allergies  Social History   Socioeconomic History   Marital status: Married    Spouse name: Not on file   Number  of children: Not on file   Years of education: Not on file   Highest education level: Not on file  Occupational History   Not on file  Tobacco Use   Smoking status: Never   Smokeless tobacco: Former   Tobacco comments:    a long time ago  Vaping Use   Vaping Use: Never used  Substance and Sexual Activity   Alcohol use: Yes    Alcohol/week: 3.0 standard drinks of alcohol    Types: 3 Cans of beer per week    Comment: sometimes 3 beers daily   Drug use: No   Sexual activity: Yes  Other Topics Concern   Not on file  Social History Narrative   Not on file   Social  Determinants of Health   Financial Resource Strain: Not on file  Food Insecurity: Not on file  Transportation Needs: Not on file  Physical Activity: Not on file  Stress: Not on file  Social Connections: Not on file  Intimate Partner Violence: Not on file   Family History  Problem Relation Age of Onset   Stroke Mother    Cancer Mother    COPD Mother    Heart disease Father    COPD Father    Heart attack Father    Heart Problems Brother        heart valve replaced at 62 years old   Heart Problems Brother        MI at age 75; CABG at age 44   Cancer Maternal Grandmother    Colon cancer Neg Hx    BP 90/60   Pulse 66   Wt 73 kg (161 lb)   SpO2 96%   BMI 24.84 kg/m   Wt Readings from Last 3 Encounters:  01/12/23 73 kg (161 lb)  12/22/22 71.7 kg (158 lb)  10/12/22 74.6 kg (164 lb 6.4 oz)   PHYSICAL EXAM: General:  NAD. No resp difficulty HEENT: Normal Neck: Supple. JVP 9-10 Carotids 2+ bilat; no bruits. No lymphadenopathy or thryomegaly appreciated. Cor: PMI nondisplaced. Regular rate & rhythm. No rubs, gallops or murmurs. Lungs: Clear Abdomen: Soft, nontender, nondistended. No hepatosplenomegaly. No bruits or masses. Good bowel sounds. Extremities: No cyanosis, clubbing, rash, edema Neuro: Alert & oriented x 3, cranial nerves grossly intact. Moves all 4 extremities w/o difficulty. Affect  pleasant.  Device interrogation (personally reviewed): OptiVol up, thoracic impedence down, no VT, no AF, 5.7 hr/day activity, < 0.1% VP  ASSESSMENT & PLAN:  1. Chronic Systolic Heart Failure  - prior h/o ischemic CM, that improved post revascularization. - EF 30-35% at time of CABG in 2015, EF normalized on repeat Echos in 2016 and 2017 (50-55%) - Echo (1/23): EF back down, 30-35%, RV mildly reduced.  - R/LHC (1/23) w/ occluded SVG-RCA but distal RCA filled by L>R collaterals. All other grafts patent. EF out of proportion to degree of CAD. RHC w/ elevated Rt sided filling pressures w/ fairly normal PCWP ~17. CO marginal, CI 2.0.  - Suspect PVC - mediated CM - cMRI (1/23) EF 29% with inferior and inferlolateral scar with some RV LGE with ? Sarcoid - Echo 11/10/21 EF 30-35%  - Repeat Zio 6/23 19.1% PVC - PVC ablation (6/23) - Echo (9/23): EF 25-30%, mild LVH, grade 2 DD, RV mildly reduced - Zio (9/23): mostly NSR, rare PACs, 2.4% PVC burden - s/p MDT ICD - Stable NYHA I-II, volume up a bit on device interrogation - Take torsemide 20 mg every other day x 1 week, then PRN thereafter. - Continue losartan 25 mg daily (BP too soft for Entresto) - Continue spiro 12.5 daily - Continue Farxiga 10 mg daily. - Continue Coreg 3.125 mg bid. - Arrange cardiac PET scanning to look for potential sarcoid - Labs today.   2. PVCs - High PVC burden, Holter 5/21 showed 13% burden.  - Suspect contributing to CM. - No coronary ischemia on cath.  - Zio 2/23 w/ burden down to 8.8% on dual AAD therapy w/ amio + mexilitine  - Zio 5/23 19.1% PVC - PVC ablation 6/23 - Zio 9/23: 2.4% PVCs - Encouraged BiPap   3. CAD - remote MI 2011, s/p PCI to pLAD - CABG x 4 in 2015 - LHC (1/23): w/ 3/4 patent grafts,  occluded SVG- RCA w/ L>R collaterals.  - No s/s angina - Continue ASA + statin - Continue Coreg 6.25 mg bid   4. Pericardial effusion - Large posterior effusion on MRI without tamponade.  - Continue  w/ diuretics. - Resolved on echo 5/23   5. HTN - BP stable - GDMT as above   6. OSA - Has BiPap, followed by Dr. Tresa Endo - encouraged him to use more regualrly  7. Type 2DM  - Recent A1C 6.0 - on Metformin + Farxiga   Follow up in 4 months with Dr. Gala Romney  Jacklynn Ganong, FNP  3:29 PM

## 2023-01-12 NOTE — Patient Instructions (Addendum)
Thank you for coming in today  If you had labs drawn today, any labs that are abnormal the clinic will call you No news is good news   You will be called and scheduled for a Cardiac Pet Scan   Medications: TAKE Torsemide 20 mg every other day for a week then as needed for For weight gain of 3 lbs in 24 hours or 5 lbs in a week   Follow up appointments:  Your physician recommends that you schedule a follow-up appointment in:  4 months With Dr. Gala Romney You will receive a reminder letter in the mail a few months in advance. If you don't receive a letter, please call our office to schedule the follow-up appointment.     Do the following things EVERYDAY: Weigh yourself in the morning before breakfast. Write it down and keep it in a log. Take your medicines as prescribed Eat low salt foods--Limit salt (sodium) to 2000 mg per day.  Stay as active as you can everyday Limit all fluids for the day to less than 2 liters   At the [facility], you and your health needs are our priority. As part of our continuing mission to provide you with exceptional heart care, we have created designated Provider Care Teams. These Care Teams include your primary Cardiologist (physician) and Advanced Practice Providers (APPs- Physician Assistants and Nurse Practitioners) who all work together to provide you with the care you need, when you need it.   You may see any of the following providers on your designated Care Team at your next follow up: Dr Arvilla Meres Dr Marca Ancona Dr. Marcos Eke, NP Robbie Lis, [place] [facility] [place], [place] Brynda Peon, NP Karle Plumber, PharmD   Please be sure to bring in all your medications bottles to every appointment.    Thank you for choosing [facility]  If you have any questions or concerns before your next appointment please send [place] a message through [place] or call  our office at [phone].    TO LEAVE A MESSAGE FOR THE NURSE SELECT OPTION 2, PLEASE LEAVE A MESSAGE INCLUDING: YOUR NAME DATE OF BIRTH CALL BACK NUMBER REASON FOR CALL**this is important as we prioritize the call backs  YOU WILL RECEIVE A CALL BACK THE SAME DAY AS LONG AS YOU CALL BEFORE 4:00 PM

## 2023-01-22 ENCOUNTER — Other Ambulatory Visit: Payer: Self-pay | Admitting: Family Medicine

## 2023-01-24 ENCOUNTER — Other Ambulatory Visit: Payer: Self-pay | Admitting: Cardiovascular Disease

## 2023-01-24 ENCOUNTER — Other Ambulatory Visit: Payer: Self-pay | Admitting: *Deleted

## 2023-01-24 ENCOUNTER — Encounter: Payer: Self-pay | Admitting: Family Medicine

## 2023-01-24 DIAGNOSIS — E785 Hyperlipidemia, unspecified: Secondary | ICD-10-CM

## 2023-01-24 MED ORDER — ROSUVASTATIN CALCIUM 20 MG PO TABS: 20.0000 mg | ORAL_TABLET | Freq: Every day | ORAL | 3 refills | Status: DC

## 2023-03-15 ENCOUNTER — Encounter (INDEPENDENT_AMBULATORY_CARE_PROVIDER_SITE_OTHER): Payer: BC Managed Care – PPO | Admitting: Ophthalmology

## 2023-03-15 ENCOUNTER — Other Ambulatory Visit (HOSPITAL_COMMUNITY): Payer: Self-pay | Admitting: Family Medicine

## 2023-03-17 ENCOUNTER — Encounter (INDEPENDENT_AMBULATORY_CARE_PROVIDER_SITE_OTHER): Payer: BC Managed Care – PPO | Admitting: Ophthalmology

## 2023-03-17 DIAGNOSIS — H348322 Tributary (branch) retinal vein occlusion, left eye, stable: Secondary | ICD-10-CM | POA: Diagnosis not present

## 2023-03-17 DIAGNOSIS — H353221 Exudative age-related macular degeneration, left eye, with active choroidal neovascularization: Secondary | ICD-10-CM

## 2023-03-17 DIAGNOSIS — H32 Chorioretinal disorders in diseases classified elsewhere: Secondary | ICD-10-CM | POA: Diagnosis not present

## 2023-03-17 DIAGNOSIS — B399 Histoplasmosis, unspecified: Secondary | ICD-10-CM | POA: Diagnosis not present

## 2023-03-17 DIAGNOSIS — H43813 Vitreous degeneration, bilateral: Secondary | ICD-10-CM

## 2023-03-17 DIAGNOSIS — I10 Essential (primary) hypertension: Secondary | ICD-10-CM

## 2023-03-17 DIAGNOSIS — H35033 Hypertensive retinopathy, bilateral: Secondary | ICD-10-CM

## 2023-03-18 ENCOUNTER — Ambulatory Visit (INDEPENDENT_AMBULATORY_CARE_PROVIDER_SITE_OTHER): Payer: BC Managed Care – PPO

## 2023-03-18 DIAGNOSIS — I255 Ischemic cardiomyopathy: Secondary | ICD-10-CM

## 2023-03-20 ENCOUNTER — Other Ambulatory Visit: Payer: Self-pay | Admitting: Family Medicine

## 2023-03-21 LAB — CUP PACEART REMOTE DEVICE CHECK
Battery Remaining Longevity: 164 mo
Battery Voltage: 3.05 V
Brady Statistic RA Percent Paced: INVALID
Brady Statistic RV Percent Paced: 0.06 %
Date Time Interrogation Session: 20240907080856
HighPow Impedance: 53 Ohm
Implantable Lead Connection Status: 753985
Implantable Lead Implant Date: 20240228
Implantable Lead Location: 753860
Implantable Pulse Generator Implant Date: 20240228
Lead Channel Impedance Value: 285 Ohm
Lead Channel Impedance Value: 342 Ohm
Lead Channel Pacing Threshold Amplitude: 0.75 V
Lead Channel Pacing Threshold Pulse Width: 0.4 ms
Lead Channel Sensing Intrinsic Amplitude: 11.3 mV
Lead Channel Setting Pacing Amplitude: 2 V
Lead Channel Setting Pacing Pulse Width: 0.4 ms
Lead Channel Setting Sensing Sensitivity: 0.3 mV
Zone Setting Status: 755011

## 2023-03-22 NOTE — Progress Notes (Signed)
Remote ICD transmission.   

## 2023-03-29 ENCOUNTER — Encounter: Payer: Self-pay | Admitting: Cardiology

## 2023-04-12 ENCOUNTER — Encounter: Payer: Self-pay | Admitting: Family Medicine

## 2023-04-12 ENCOUNTER — Ambulatory Visit (INDEPENDENT_AMBULATORY_CARE_PROVIDER_SITE_OTHER): Payer: BC Managed Care – PPO | Admitting: Family Medicine

## 2023-04-12 VITALS — BP 138/82 | HR 60 | Temp 97.7°F | Ht 67.5 in | Wt 158.4 lb

## 2023-04-12 DIAGNOSIS — I5020 Unspecified systolic (congestive) heart failure: Secondary | ICD-10-CM

## 2023-04-12 DIAGNOSIS — E118 Type 2 diabetes mellitus with unspecified complications: Secondary | ICD-10-CM | POA: Diagnosis not present

## 2023-04-12 DIAGNOSIS — Z7984 Long term (current) use of oral hypoglycemic drugs: Secondary | ICD-10-CM

## 2023-04-12 DIAGNOSIS — E1159 Type 2 diabetes mellitus with other circulatory complications: Secondary | ICD-10-CM | POA: Diagnosis not present

## 2023-04-12 DIAGNOSIS — Z23 Encounter for immunization: Secondary | ICD-10-CM | POA: Diagnosis not present

## 2023-04-12 DIAGNOSIS — I152 Hypertension secondary to endocrine disorders: Secondary | ICD-10-CM | POA: Diagnosis not present

## 2023-04-12 LAB — POCT GLYCOSYLATED HEMOGLOBIN (HGB A1C): Hemoglobin A1C: 5.4 % (ref 4.0–5.6)

## 2023-04-12 MED ORDER — METFORMIN HCL ER 500 MG PO TB24: 500.0000 mg | ORAL_TABLET | Freq: Every day | ORAL | Status: DC

## 2023-04-12 NOTE — Progress Notes (Signed)
Subjective  CC:  Chief Complaint  Patient presents with   Diabetes    Pt here for 6 month diabetic f/u. Pt has not had foot exam. Eye exam has not been done yet   Hypertension    HPI: Shane [name] is a 57 y.o. male who presents to the office today for follow up of diabetes and problems listed above in the chief complaint.  Diabetes follow up: His diabetic control is reported as Improved.  He denies exertional CP or SOB or symptomatic hypoglycemia. He denies foot sores or paresthesias.  He is overdue an eye exam.  He does have macular degeneration and sees a retinal specialist regularly but is due for his diabetic eye exam.  No known history of retinopathy. Hypertension has been well-controlled.  No longer having lows.  Continues with cardiology for heart failure.  No shortness of breath Eligible for flu vaccine today  Wt Readings from Last 3 Encounters:  04/12/23 158 lb 6.4 oz (71.8 kg)  01/12/23 161 lb (73 kg)  12/22/22 158 lb (71.7 kg)    BP Readings from Last 3 Encounters:  04/12/23 138/82  01/12/23 90/60  12/22/22 (!) 108/56    Assessment  1. Controlled type 2 diabetes mellitus with complication, without long-term current use of insulin (HCC)   2. HFrEF (heart failure with reduced ejection fraction) (HCC)   3. Hypertension associated with type 2 diabetes mellitus (HCC)   4. Need for influenza vaccination      Plan  Diabetes is currently very well controlled.  Will continue Farxiga 10 but decrease metformin XR to 500 daily.  He may be able to come off that.  Did discuss diet.  He has lost a few pounds.  Work on eating good sources of protein and healthy fats.  Patient to schedule eye exam Failure and hypertension are controlled. Flu shot updated today  Follow up: 6 months for complete physical and follow-up Orders Placed This Encounter  Procedures   Flu vaccine trivalent PF, 6mos and older(Flulaval,Afluria,Fluarix,Fluzone)   POCT HgB A1C   No orders of the  defined types were placed in this encounter.     Immunization History  Administered Date(s) Administered   Hepatitis A, Adult 10/08/2021, 04/13/2022   Hepatitis B, ADULT 11/12/2021   Hepb-cpg 10/08/2021   Influenza, Seasonal, Injecte, Preservative Fre 04/12/2023   Influenza,inj,Quad PF,6+ Mos 04/03/2018, 04/03/2019, 04/10/2021, 04/16/2022   Influenza-Unspecified 03/26/2020   PFIZER Comirnaty(Gray Top)Covid-19 Tri-Sucrose Vaccine 10/30/2020   PFIZER(Purple Top)SARS-COV-2 Vaccination 10/22/2019, 11/12/2019   Tdap 11/16/2017   Zoster Recombinant(Shingrix) 10/09/2020, 04/10/2021    Diabetes Related Lab Review: Lab Results  Component Value Date   HGBA1C 5.4 04/12/2023   HGBA1C 6.0 10/12/2022   HGBA1C 5.7 (A) 04/16/2022    Lab Results  Component Value Date   MICROALBUR <0.7 10/12/2022   Lab Results  Component Value Date   CREATININE 1.13 01/12/2023   BUN 17 01/12/2023   NA 136 01/12/2023   K 4.6 01/12/2023   CL 105 01/12/2023   CO2 18 (L) 01/12/2023   Lab Results  Component Value Date   CHOL 158 10/12/2022   CHOL 146 10/08/2021   CHOL 103 10/09/2020   Lab Results  Component Value Date   HDL 64.80 10/12/2022   HDL 67.70 10/08/2021   HDL 44.50 10/09/2020   Lab Results  Component Value Date   LDLCALC 63 10/12/2022   LDLCALC 65 10/08/2021   LDLCALC 48 10/09/2020   Lab Results  Component Value Date  TRIG 151.0 (H) 10/12/2022   TRIG 67.0 10/08/2021   TRIG 56.0 10/09/2020   Lab Results  Component Value Date   CHOLHDL 2 10/12/2022   CHOLHDL 2 10/08/2021   CHOLHDL 2 10/09/2020   No results found for: "LDLDIRECT" The ASCVD Risk score (Arnett DK, et al., 2019) failed to calculate for the following reasons:   The patient has a prior MI or stroke diagnosis I have reviewed the PMH, Fam and Soc history. Patient Active Problem List   Diagnosis Date Noted Date Diagnosed   Controlled type 2 diabetes mellitus with complication, without long-term current use of  insulin (HCC) 04/16/2022     Priority: High   Hypertension associated with type 2 diabetes mellitus (HCC) 01/14/2022     Priority: High   Primary biliary cholangitis (HCC) 10/08/2021     Priority: High   HFrEF (heart failure with reduced ejection fraction) (HCC) 08/18/2021     Priority: High   CAD (coronary artery disease) 12/03/2019     Priority: High   OSA on CPAP 09/07/2018     Priority: High    Obstructive sleep apnea    GAD (generalized anxiety disorder) 05/25/2018     Priority: High   Major depression, recurrent, chronic (HCC) 05/25/2018     Priority: High   S/P CABG (coronary artery bypass graft) 11/16/2017     Priority: High    CAD    Mixed hyperlipidemia 05/24/2014     Priority: High    Hyperlipidemia    Alcohol use disorder, mild, abuse 03/25/2019     Priority: Medium    Acquired hypothyroidism 11/16/2017     Priority: Medium     10/2021    Macular degeneration      Priority: Medium    History of Hodgkin's lymphoma 07/13/1995     Priority: Medium     Treated 30s; cured.    PVC's (premature ventricular contractions) 09/11/2014     Priority: Low    Frequent PVCs    Gynecomastia 04/16/2022    PVC (premature ventricular contraction) 12/23/2021    Mediastinal adenopathy 10/20/2021    LV dysfunction 07/24/2021     LV dysfunction     Social History: Patient  reports that he has never smoked. He has quit using smokeless tobacco. He reports current alcohol use of about 3.0 standard drinks of alcohol per week. He reports that he does not use drugs.  Review of Systems: Ophthalmic: negative for eye pain, loss of vision or double vision Cardiovascular: negative for chest pain Respiratory: negative for SOB or persistent cough Gastrointestinal: negative for abdominal pain Genitourinary: negative for dysuria or gross hematuria MSK: negative for foot lesions Neurologic: negative for weakness or gait disturbance  Objective  Vitals: BP 138/82   Pulse 60   Temp  97.7 F (36.5 C)   Ht 5' 7.5" (1.715 m)   Wt 158 lb 6.4 oz (71.8 kg)   SpO2 97%   BMI 24.44 kg/m  General: well appearing, no acute distress  Psych:  Alert and oriented, normal mood and affect HEENT:  Normocephalic, atraumatic, moist mucous membranes, supple neck  Cardiovascular:  Nl S1 and S2, RRR without murmur, gallop or rub. no edema Respiratory:  Good breath sounds bilaterally, CTAB with normal effort, no rales   Diabetic education: ongoing education regarding chronic disease management for diabetes was given today. We continue to reinforce the ABC's of diabetic management: A1c (<7 or 8 dependent upon patient), tight blood pressure control, and cholesterol management with goal  LDL < 100 minimally. We discuss diet strategies, exercise recommendations, medication options and possible side effects. At each visit, we review recommended immunizations and preventive care recommendations for diabetics and stress that good diabetic control can prevent other problems. See below for this patient's data.   Commons side effects, risks, benefits, and alternatives for medications and treatment plan prescribed today were discussed, and the patient expressed understanding of the given instructions. Patient is instructed to call or message via MyChart if he/she has any questions or concerns regarding our treatment plan. No barriers to understanding were identified. We discussed Red Flag symptoms and signs in detail. Patient expressed understanding regarding what to do in case of urgent or emergency type symptoms.  Medication list was reconciled, printed and provided to the patient in AVS. Patient instructions and summary information was reviewed with the patient as documented in the AVS. This note was prepared with assistance of Dragon voice recognition software. Occasional wrong-word or sound-a-like substitutions may have occurred due to the inherent limitations of voice recognition software

## 2023-04-13 MED ORDER — SILDENAFIL CITRATE 100 MG PO TABS: 100.0000 mg | ORAL_TABLET | Freq: Every day | ORAL | 5 refills | Status: AC | PRN

## 2023-04-19 ENCOUNTER — Emergency Department (HOSPITAL_COMMUNITY): Payer: BC Managed Care – PPO

## 2023-04-19 ENCOUNTER — Other Ambulatory Visit: Payer: Self-pay

## 2023-04-19 ENCOUNTER — Encounter (HOSPITAL_COMMUNITY): Payer: Self-pay | Admitting: Emergency Medicine

## 2023-04-19 ENCOUNTER — Emergency Department (HOSPITAL_COMMUNITY)
Admission: EM | Admit: 2023-04-19 | Discharge: 2023-04-20 | Disposition: A | Discharge status: Home / Self Care | Payer: BC Managed Care – PPO | Attending: Emergency Medicine | Admitting: Emergency Medicine

## 2023-04-19 DIAGNOSIS — I2510 Atherosclerotic heart disease of native coronary artery without angina pectoris: Secondary | ICD-10-CM | POA: Diagnosis not present

## 2023-04-19 DIAGNOSIS — Z7982 Long term (current) use of aspirin: Secondary | ICD-10-CM | POA: Diagnosis not present

## 2023-04-19 DIAGNOSIS — R0789 Other chest pain: Secondary | ICD-10-CM | POA: Insufficient documentation

## 2023-04-19 DIAGNOSIS — R079 Chest pain, unspecified: Secondary | ICD-10-CM | POA: Diagnosis not present

## 2023-04-19 DIAGNOSIS — Z955 Presence of coronary angioplasty implant and graft: Secondary | ICD-10-CM | POA: Diagnosis not present

## 2023-04-19 DIAGNOSIS — I213 ST elevation (STEMI) myocardial infarction of unspecified site: Secondary | ICD-10-CM | POA: Diagnosis not present

## 2023-04-19 DIAGNOSIS — I4430 Unspecified atrioventricular block: Secondary | ICD-10-CM | POA: Diagnosis not present

## 2023-04-19 DIAGNOSIS — I499 Cardiac arrhythmia, unspecified: Secondary | ICD-10-CM | POA: Diagnosis not present

## 2023-04-19 DIAGNOSIS — Z951 Presence of aortocoronary bypass graft: Secondary | ICD-10-CM | POA: Diagnosis not present

## 2023-04-19 DIAGNOSIS — R590 Localized enlarged lymph nodes: Secondary | ICD-10-CM | POA: Diagnosis not present

## 2023-04-19 LAB — CBC
HCT: 44.2 % (ref 39.0–52.0)
Hemoglobin: 14.4 g/dL (ref 13.0–17.0)
MCH: 32.1 pg (ref 26.0–34.0)
MCHC: 32.6 g/dL (ref 30.0–36.0)
MCV: 98.4 fL (ref 80.0–100.0)
Platelets: 246 10*3/uL (ref 150–400)
RBC: 4.49 MIL/uL (ref 4.22–5.81)
RDW: 12.1 % (ref 11.5–15.5)
WBC: 7.5 10*3/uL (ref 4.0–10.5)
nRBC: 0 % (ref 0.0–0.2)

## 2023-04-19 LAB — BASIC METABOLIC PANEL
Anion gap: 11 (ref 5–15)
BUN: 19 mg/dL (ref 6–20)
CO2: 23 mmol/L (ref 22–32)
Calcium: 9.6 mg/dL (ref 8.9–10.3)
Chloride: 106 mmol/L (ref 98–111)
Creatinine, Ser: 0.95 mg/dL (ref 0.61–1.24)
GFR, Estimated: >60 mL/min (ref >60)
Glucose, Bld: 96 mg/dL (ref 70–99)
Potassium: 4.3 mmol/L (ref 3.5–5.1)
Sodium: 140 mmol/L (ref 135–145)

## 2023-04-19 LAB — TROPONIN I (HIGH SENSITIVITY): Troponin I (High Sensitivity): 6 ng/L (ref <18)

## 2023-04-19 NOTE — ED Triage Notes (Signed)
Per [facility] pt coming from home c/o chest tightness onset of 3pm today. Took 2 nitroglycerin tablets and 325 aspirin at home that resolved pain. Pain was non radiating.

## 2023-04-20 LAB — TROPONIN I (HIGH SENSITIVITY): Troponin I (High Sensitivity): 5 ng/L (ref <18)

## 2023-04-20 LAB — BRAIN NATRIURETIC PEPTIDE: B Natriuretic Peptide: 424.4 pg/mL — ABNORMAL HIGH (ref 0.0–100.0)

## 2023-04-20 NOTE — ED Provider Notes (Signed)
[place] EMERGENCY DEPARTMENT AT [facility] Provider Note   CSN: [id number] Arrival date & time: 04/19/23  1726     History  Chief Complaint  Patient presents with   Chest Pain    Shane [name] is a 57 y.o. male.  Presents to the emergency department for evaluation of chest pain.  Patient reports he had onset of pain in his chest around 3 PM.  It was a tightness that he has never felt before.  Patient took a nitroglycerin tablet with improvement.  Approximately 30 minutes later and the pain resolved.  Patient reports that he has had some intermittent episodes of pain since he has been here in the ED.  These only last a minute or 2 and then resolve.       Home Medications Prior to Admission medications   Medication Sig Start Date End Date Taking? Authorizing Provider  acetaminophen (TYLENOL) 500 MG tablet Take 500 mg by mouth every 6 (six) hours as needed for headache (pain).    [provider]  ARIPiprazole (ABILIFY) 5 MG tablet Take 1 tablet (5 mg total) by mouth daily. 10/29/22   Mozingo, Thereasa Solo, NP  aspirin 81 MG tablet Take 81 mg by mouth daily.    [provider]  Bevacizumab (AVASTIN IV) Inject 1 Dose into the eye See admin instructions. Every six to eight weeks in left eye    [provider]  carvedilol (COREG) 3.125 MG tablet Take 1 tablet by mouth twice daily. 08/27/22   Runell Gess, MD  ciprofloxacin (CILOXAN) 0.3 % ophthalmic solution Place 1 drop into the left eye 4 (four) times daily. After Eye injection every 6-8 weeks for two days 10/06/21   [provider]  dapagliflozin propanediol (FARXIGA) 10 MG TABS tablet TAKE 1 TABLET BY MOUTH DAILY BEFORE BREAKFAST 03/15/23   Milford, Anderson Malta, FNP  escitalopram (LEXAPRO) 20 MG tablet Take 1 tablet (20 mg total) by mouth daily. 10/29/22   Mozingo, Thereasa Solo, NP  levothyroxine (SYNTHROID) 50 MCG tablet Take 1 tablet by mouth daily. 03/21/23   Willow Ora,  MD  LORazepam (ATIVAN) 0.5 MG tablet Take 1 tablet (0.5 mg total) by mouth 2 (two) times daily as needed for anxiety. 04/30/22   Mozingo, Thereasa Solo, NP  losartan (COZAAR) 25 MG tablet Take 1 tablet (25 mg total) by mouth daily. 07/02/22   Bensimhon, Bevelyn Buckles, MD  metFORMIN (GLUCOPHAGE-XR) 500 MG 24 hr tablet Take 1 tablet (500 mg total) by mouth daily with breakfast. 04/12/23   Willow Ora, MD  rosuvastatin (CRESTOR) 20 MG tablet Take 1 tablet (20 mg total) by mouth daily. 01/24/23   Runell Gess, MD  sildenafil (VIAGRA) 100 MG tablet Take 1 tablet (100 mg total) by mouth daily as needed for erectile dysfunction. 04/13/23   Willow Ora, MD  spironolactone (ALDACTONE) 25 MG tablet TAKE 1/2 TABLET(12.5 MG) BY MOUTH AT BEDTIME 01/03/23   Bensimhon, Bevelyn Buckles, MD  torsemide (DEMADEX) 20 MG tablet Take 20 mg by mouth daily as needed (swelling).    [provider]  ursodiol (ACTIGALL) 500 MG tablet Take 500 mg by mouth 2 (two) times daily. 06/09/21   [provider]      Allergies    Patient has no known allergies.    Review of Systems   Review of Systems  Physical Exam Updated Vital Signs BP 134/79 (BP Location: Left Arm)   Pulse (!) 52   Temp 97.7  F (36.5 C) (Oral)   Resp 16   Ht 5' 7.5" (1.715 m)   Wt 71 kg   SpO2 100%   BMI 24.15 kg/m  Physical Exam  ED Results / Procedures / Treatments   Labs (all labs ordered are listed, but only abnormal results are displayed) Labs Reviewed  BRAIN NATRIURETIC PEPTIDE - Abnormal; Notable for the following components:      Result Value   B Natriuretic Peptide 424.4 (*)    All other components within normal limits  BASIC METABOLIC PANEL  CBC  TROPONIN I (HIGH SENSITIVITY)  TROPONIN I (HIGH SENSITIVITY)    EKG EKG Interpretation Date/Time:  Tuesday April 19 2023 17:14:41 EDT Ventricular Rate:  62 PR Interval:  228 QRS Duration:  112 QT Interval:  450 QTC Calculation: 456 R Axis:   -58  Text  Interpretation: Sinus rhythm with 1st degree A-V block Left axis deviation Minimal voltage criteria for LVH, may be normal variant ( Cornell product ) Anterior infarct , age undetermined Abnormal ECG When compared with ECG of 08-Sep-2022 17:24, No acute changes Confirmed by Gilda Crease ([id number]) on 04/20/2023 2:12:45 AM  Radiology DG Chest 2 View  Result Date: 04/19/2023 CLINICAL DATA:  Chest pain EXAM: CHEST - 2 VIEW COMPARISON:  09/08/2022 FINDINGS: Calcified paratracheal adenopathy, chronic/granulomatous appearance. Normal heart size and aortic contours. There has been CABG and coronary stenting. Single chamber defibrillator lead into the right ventricle with stable lead orientation. There is no edema, consolidation, effusion, or pneumothorax. IMPRESSION: Stable exam.  No acute finding Electronically Signed   By: Tiburcio Pea M.D.   On: 04/19/2023 18:45    Procedures Procedures    Medications Ordered in ED Medications - No data to display  ED Course/ Medical Decision Making/ A&P                                 Medical Decision Making Amount and/or Complexity of Data Reviewed External Data Reviewed: labs, ECG and notes. Labs: ordered. Decision-making details documented in ED Course. Radiology: ordered.   Differential Diagnosis considered includes, but not limited to: STEMI; NSTEMI; myocarditis; pericarditis; pulmonary embolism; aortic dissection; pneumothorax; pneumonia; gastritis; musculoskeletal pain  Patient presents with chest pain.  Patient does have a history of significant coronary disease.  Patient has had multivessel bypass.  He has a resultant ischemic cardiomyopathy.  Review of records reveals heart cath in 2023 which showed right coronary bypass graft occluded with collaterals, all other grafts widely patent.  Patient reports that the discomfort he had tonight was something he had never felt before.  He is pain-free at arrival, did take 2 nitroglycerin at home.   EKG unchanged.  Troponin negative x 2.  Discussed options with patient.  He could be observed overnight versus prompt cardiology follow-up.  His most recent heart cath is reassuring that there would be no options or need for intervention.  Patient continues to feel well, no recurrence of pain in the 10 hours that he has been here.  Patient will therefore be discharged with cardiology follow-up.  He was counseled to return to the ER for any recurrence of symptoms.        Final Clinical Impression(s) / ED Diagnoses Final diagnoses:  Chest pain, unspecified type    Rx / DC Orders ED Discharge Orders          Ordered    Ambulatory referral to Cardiology  Comments: If you have not heard from the Cardiology office within the next 72 hours please call [phone].   04/20/23 0402              Gilda Crease, MD 04/20/23 [id number]

## 2023-04-21 ENCOUNTER — Encounter: Payer: Self-pay | Admitting: Cardiology

## 2023-04-29 ENCOUNTER — Encounter: Payer: Self-pay | Admitting: Adult Health

## 2023-04-29 ENCOUNTER — Ambulatory Visit: Payer: BC Managed Care – PPO | Admitting: Adult Health

## 2023-04-29 DIAGNOSIS — F39 Unspecified mood [affective] disorder: Secondary | ICD-10-CM

## 2023-04-29 DIAGNOSIS — F411 Generalized anxiety disorder: Secondary | ICD-10-CM | POA: Diagnosis not present

## 2023-04-29 DIAGNOSIS — F321 Major depressive disorder, single episode, moderate: Secondary | ICD-10-CM | POA: Diagnosis not present

## 2023-04-29 MED ORDER — ARIPIPRAZOLE 5 MG PO TABS: 5.0000 mg | ORAL_TABLET | Freq: Every day | ORAL | 1 refills | Status: DC

## 2023-04-29 MED ORDER — ESCITALOPRAM OXALATE 20 MG PO TABS: 20.0000 mg | ORAL_TABLET | Freq: Every day | ORAL | 1 refills | Status: DC

## 2023-04-29 NOTE — Progress Notes (Signed)
Shane [name] [id number] October 26, 1965 57 y.o.  Subjective:   Patient ID:  Shane [name] is a 57 y.o. (DOB 05/30/1966) male.  Chief Complaint: No chief complaint on file.   HPI Shane [name] presents to the office today for follow-up of Shane [name], insomnia, mood disorder, panic attacks.   Describes mood today as "ok". Pleasant. Denies tearfulness. Mood symptoms - denies depression and irritability. Reports situational anxiety - work related. Denies panic attacks. Denies worry, rumination, and over thinking. Mood is consistent. Stating "I feel like I'm doing alright". Feels like medications continue to work well. He and wife doing well. Stable interest and motivation. Taking medications as prescribed.  Energy levels stable. Active, does not have a regular exercise routine. Enjoys some usual interests and activities. Married. Lives with wife - 3 kittens - 1 dog. Has 2 grown sons. Family in [place].  Appetite adequate. Weight stable - 158 pounds.  Sleeps well most nights. Averages 7 hours. Using CPAP machine more often. Focus and concentration stable. Completing tasks. Managing aspects of household. Work going well [employer]. Denies SI or HI.  Denies AH or VH. Denies self harm. Denies substance use.   GAD-7    Flowsheet Row Office Visit from 04/12/2023 in [facility] from 10/12/2022 in [facility] from 05/24/2018 in [facility] from 04/03/2018 in [facility]  Total GAD-7 Score 2 0 2 9      PHQ2-9    Flowsheet Row Office Visit from 04/12/2023 in [facility] from 10/12/2022 in [facility] from 04/16/2022 in [facility] from 01/14/2022 in [facility] from 10/08/2021 in [facility]   PHQ-2 Total Score 0 0 0 0 0  PHQ-9 Total Score 2 -- -- -- 2      Flowsheet Row ED from 04/19/2023 in [facility] Emergency Department at [facility] Admission (Discharged) from 09/08/2022 in [facility] CARDIAC CATH LAB Admission (Discharged) from 12/23/2021 in [facility] 4E CV SURGICAL PROGRESSIVE CARE  C-SSRS RISK CATEGORY No Risk No Risk No Risk        Review of Systems:  Review of Systems  Musculoskeletal:  Negative for gait problem.  Neurological:  Negative for tremors.  Psychiatric/Behavioral:         Please refer to HPI    Medications: I have reviewed the patient's current medications.  Current Outpatient Medications  Medication Sig Dispense Refill   acetaminophen (TYLENOL) 500 MG tablet Take 500 mg by mouth every 6 (six) hours as needed for headache (pain).     ARIPiprazole (ABILIFY) 5 MG tablet Take 1 tablet (5 mg total) by mouth daily. 90 tablet 1   aspirin 81 MG tablet Take 81 mg by mouth daily.     Bevacizumab (AVASTIN IV) Inject 1 Dose into the eye See admin instructions. Every six to eight weeks in left eye     carvedilol (COREG) 3.125 MG tablet Take 1 tablet by mouth twice daily. 180 tablet 2   ciprofloxacin (CILOXAN) 0.3 % ophthalmic solution Place 1 drop into the left eye 4 (four) times daily. After Eye injection every 6-8 weeks for two days     dapagliflozin propanediol (FARXIGA) 10 MG TABS tablet TAKE 1 TABLET BY MOUTH DAILY BEFORE BREAKFAST 30 tablet 6   escitalopram (LEXAPRO) 20 MG tablet Take 1 tablet (20  mg total) by mouth daily. 90 tablet 1   levothyroxine (SYNTHROID) 50 MCG tablet Take 1 tablet by mouth daily. 90 tablet 0   LORazepam (ATIVAN) 0.5 MG tablet Take 1 tablet (0.5 mg total) by mouth 2 (two) times daily as needed for anxiety. 60 tablet 2   losartan (COZAAR) 25 MG tablet Take 1 tablet (25 mg total) by mouth daily. 90 tablet 3   metFORMIN (GLUCOPHAGE-XR) 500 MG 24 hr tablet Take 1 tablet (500 mg total) by mouth daily with breakfast.      rosuvastatin (CRESTOR) 20 MG tablet Take 1 tablet (20 mg total) by mouth daily. 90 tablet 3   sildenafil (VIAGRA) 100 MG tablet Take 1 tablet (100 mg total) by mouth daily as needed for erectile dysfunction. 10 tablet 5   spironolactone (ALDACTONE) 25 MG tablet TAKE 1/2 TABLET(12.5 MG) BY MOUTH AT BEDTIME 15 tablet 3   torsemide (DEMADEX) 20 MG tablet Take 20 mg by mouth daily as needed (swelling).     ursodiol (ACTIGALL) 500 MG tablet Take 500 mg by mouth 2 (two) times daily.     No current facility-administered medications for this visit.    Medication Side Effects: None  Allergies: No Known Allergies  Past Medical History:  Diagnosis Date   CAD (coronary artery disease), s/p stent 2011 and CABG 2015 [facility])    Hodgkin disease [facility]) 1997   Hyperlipidemia    Macular degeneration    PVC's (premature ventricular contractions)     Past Medical History, Surgical history, Social history, and Family history were reviewed and updated as appropriate.   Please see review of systems for further details on the patient's review from today.   Objective:   Physical Exam:  There were no vitals taken for this visit.  Physical Exam Constitutional:      General: He is not in acute distress. Musculoskeletal:        General: No deformity.  Neurological:     Mental Status: He is alert and oriented to person, place, and time.     Coordination: Coordination normal.  Psychiatric:        Attention and Perception: Attention and perception normal. He does not perceive auditory or visual hallucinations.        Mood and Affect: Affect is not labile, blunt, angry or inappropriate.        Speech: Speech normal.        Behavior: Behavior normal.        Thought Content: Thought content normal. Thought content is not paranoid or delusional. Thought content does not include homicidal or suicidal ideation. Thought content does not include homicidal or suicidal plan.        Cognition and  Memory: Cognition and memory normal.        Judgment: Judgment normal.     Comments: Insight intact     Lab Review:     Component Value Date/Time   NA 140 04/19/2023 1745   NA 140 08/24/2022 1217   NA 141 12/24/2015 1051   K 4.3 04/19/2023 1745   K 4.6 12/24/2015 1051   CL 106 04/19/2023 1745   CO2 23 04/19/2023 1745   CO2 28 12/24/2015 1051   GLUCOSE 96 04/19/2023 1745   GLUCOSE 95 12/24/2015 1051   BUN 19 04/19/2023 1745   BUN 18 08/24/2022 1217   BUN 14.6 12/24/2015 1051   CREATININE 0.95 04/19/2023 1745   CREATININE 1.1 12/24/2015 1051   CALCIUM 9.6 04/19/2023 1745  CALCIUM 9.8 12/24/2015 1051   PROT 7.2 10/12/2022 0953   PROT 6.8 07/24/2021 1131   PROT 7.6 12/24/2015 1051   ALBUMIN 4.8 10/12/2022 0953   ALBUMIN 4.3 07/24/2021 1131   ALBUMIN 4.5 12/24/2015 1051   AST 25 10/12/2022 0953   AST 25 12/24/2015 1051   ALT 29 10/12/2022 0953   ALT 34 12/24/2015 1051   ALKPHOS 134 (H) 10/12/2022 0953   ALKPHOS 45 12/24/2015 1051   BILITOT 1.0 10/12/2022 0953   BILITOT 1.7 (H) 07/24/2021 1131   BILITOT 0.99 12/24/2015 1051   GFRNONAA >60 04/19/2023 1745       Component Value Date/Time   WBC 7.5 04/19/2023 1745   RBC 4.49 04/19/2023 1745   HGB 14.4 04/19/2023 1745   HGB 14.2 08/24/2022 1217   HGB 15.7 12/24/2015 1050   HCT 44.2 04/19/2023 1745   HCT 42.0 08/24/2022 1217   HCT 44.7 12/24/2015 1050   PLT 246 04/19/2023 1745   PLT 264 08/24/2022 1217   MCV 98.4 04/19/2023 1745   MCV 95 08/24/2022 1217   MCV 93 12/24/2015 1050   MCH 32.1 04/19/2023 1745   MCHC 32.6 04/19/2023 1745   RDW 12.1 04/19/2023 1745   RDW 12.6 08/24/2022 1217   RDW 12.9 12/24/2015 1050   LYMPHSABS 1.6 10/12/2022 0953   LYMPHSABS 1.3 12/24/2015 1050   MONOABS 0.5 10/12/2022 0953   EOSABS 0.1 10/12/2022 0953   EOSABS 0.1 12/24/2015 1050   BASOSABS 0.1 10/12/2022 0953   BASOSABS 0.0 12/24/2015 1050    No results found for: "POCLITH", "LITHIUM"   No results found for: "PHENYTOIN",  "PHENOBARB", "VALPROATE", "CBMZ"   .res Assessment: Plan:    Plan:  Lexapro 20mg  daily Ativan 0.5mg  BID - uses twice a month - none needed Abilify 5mg  daily.  Continue therapy with Rockne Menghini.   RTC 6 months  Patient advised to contact office with any questions, adverse effects, or acute worsening in signs and symptoms.  Discussed potential benefits, risk, and side effects of benzodiazepines to include potential risk of tolerance and dependence, as well as possible drowsiness.  Advised patient not to drive if experiencing drowsiness and to take lowest possible effective dose to minimize risk of dependence and tolerance.   Discussed potential metabolic side effects associated with atypical antipsychotics, as well as potential risk for movement side effects. Advised pt to contact office if movement side effects occur.   Diagnoses and all orders for this visit:  Generalized anxiety disorder -     ARIPiprazole (ABILIFY) 5 MG tablet; Take 1 tablet (5 mg total) by mouth daily.  Episodic mood disorder (HCC) -     ARIPiprazole (ABILIFY) 5 MG tablet; Take 1 tablet (5 mg total) by mouth daily.  Depression, major, single episode, moderate (HCC) Comments: Marriage issues. Wife with mood disorder. Orders: -     escitalopram (LEXAPRO) 20 MG tablet; Take 1 tablet (20 mg total) by mouth daily.     Please see After Visit Summary for patient specific instructions.  Future Appointments  Date Time Provider Department Center  05/02/2023 12:00 PM Mathis Fare, LCSW CP[facility] None  05/10/2023  2:00 PM [facility] PA/NP [facility] None  05/12/2023 12:55 PM Sherrie George, MD T[facility] None  06/17/2023  7:05 AM [facility] DEVICE REMOTES [facility]  09/16/2023  7:05 AM [facility] DEVICE REMOTES [facility]  10/13/2023 10:00 AM Willow Ora, MD [facility]  12/16/2023  7:05 AM [facility] DEVICE REMOTES [facility]  03/16/2024  7:05 AM [facility] DEVICE  REMOTES  [facility]  06/15/2024  7:05 AM [facility] DEVICE REMOTES [facility]  09/14/2024  7:05 AM [facility] DEVICE REMOTES [facility]    No orders of the defined types were placed in this encounter.   -------------------------------

## 2023-05-02 ENCOUNTER — Ambulatory Visit (INDEPENDENT_AMBULATORY_CARE_PROVIDER_SITE_OTHER): Payer: BC Managed Care – PPO | Admitting: Psychiatry

## 2023-05-02 DIAGNOSIS — F411 Generalized anxiety disorder: Secondary | ICD-10-CM

## 2023-05-02 NOTE — Progress Notes (Signed)
[facility] Counselor/Therapist Progress Note  Patient ID: Shane [name], MRN: [id number],    Date: 05/02/2023  Time Spent: 45 minutes   Treatment Type: Individual Therapy  Reported Symptoms: anxiety, stress re: work, marital, and health    Mental Status Exam:  Appearance:   Casual     Behavior:  Appropriate, Sharing, and Motivated  Motor:  Normal  Speech/Language:   Clear and Coherent  Affect:  anxious  Mood:  anxious  Thought process:  goal directed  Thought content:    WNL  Sensory/Perceptual disturbances:    WNL  Orientation:  oriented to person, place, time/date, situation, day of week, month of year, year, and stated date of Oct. 21, 2024  Attention:  Good  Concentration:  Good  Memory:  WNL  Fund of knowledge:   Good  Insight:    Good  Judgment:   Good  Impulse Control:  Good   Risk Assessment: Danger to Self:  No Self-injurious Behavior: No Danger to Others: No Duty to Warn:no Physical Aggression / Violence:No  Access to Firearms a concern: No  Gang Involvement:No   Subjective: Patient today reports anxiety mostly related to work and family/marital relationship. Considering retirement into the future. Processed some issues that have been aggravating and anxiety provoking for patient, mostly related to personal relationship within family.  (Due to the nature of this concern, not all details are provided in this note due to patient privacy needs.) he was very open in session and talking through his concerns, sharing information from his physician, and his plan for moving forward.  Continues to use his CPAP machine with benefit.  Wife's daughter and fianc has moved closer to where patient and wife are living, hoping to get jobs soon.  Reporting that stress management is better with work situations and home situations or work in progress.  Processed today some sensitive personal/marital issues which seem beneficial to patient.  Is taking good care of himself  physically.  Encouraged to work with one of his goals regarding recognizing and interrupting triggers to his anxiety particularly and being able to manage situations more calmly and in ways that would likely lead to positive actions and results.  Interventions: Cognitive Behavioral Therapy and Ego-Supportive  Long Term Goal: Enhance the ability to handle effectively his depression and the full variety of life's anxieties.  Short Term Goal:  Verbalize an understanding of how thoughts, physical feelings, and behavioral actions contribute to anxiety and anger and its treatment. Strategy:  Patient will work to recognize and interrupt triggers to his anxious or depressive thoughts  so as to replace with positive, more reality-based thoughts, which lead to more positive actions/behaviors.   Diagnosis:   ICD-10-CM   1. Generalized anxiety disorder  F41.1      Plan: Patient showing good motivation and participation in session today, was very open as he focused more on some personal issues, his anxiety, and marital relationship concerns and how he and wife are working together on this.  Is maintaining earlier progress which is a positive note for him.  Has made progress and needs to continue working with goal-directed behaviors both in and out of sessions to keep moving and a healthier and more positive direction. Encouraged patient in his practice of more positive and self affirming behaviors as noted in session including: Remaining focused in the present and on the things that he can control versus cannot control, use of deep breathing exercises as noted in session to  help with his anxiety, practice more active listening skills with his wife and others, consistently practice positive self talk, stay on medications as prescribed, remain in contact with supportive people, participate as he is able and outside activities that he enjoys, and realize the strength he shows working with goal-directed behaviors to  move in a direction that supports his improved emotional health and overall wellbeing.  Goal review and progress/challenges noted with patient.  Next appointment within approximately 4 to 5 weeks.   Mathis Fare, LCSW

## 2023-05-09 NOTE — Progress Notes (Incomplete)
ADVANCED HF CLINIC PROGRESS NOTE  Primary Care: Willow Ora, MD General Cardiologist: Dr. Allyson Sabal HF Cardiologist: Dr. Gala Romney   Reason for Visit: F/u chronic systolic heart failure  HPI: Shane [name] is a 57 y.o. 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   Echo at time of CABG showed moderately reduced LVEF, 30-35%. EF improved post revascularization and had normalized, back to 50-55%, on repeat echo in 2016. In 2017, he underwent cardiac w/u for decrease exercise tolerance. Echo w/ normal LVEF, 50-55%, no significant valvular diease.    Echo 1/23 EF back down to 30-35%, RV mildly reduced, mild-mod MR. Mod TR.    R/LHC on 1/23 occluded native RCA and RCA vein graft with left-to-right collaterals. The remainder of his grafts were patent.  All his native arteries were occluded. RHC showed elevated right sided filling pressures and marginal CO (see hemodynamics below). He was direct admitted for IV diuretics and AHF consultation. He was also observed to have frequent PVCs ~25/min and previous records showed where prior Zio monitor had also showed high burden PVCs ~13%. He was placed on mexiletine for suppression but c/w frequent PVCs, was later switched to amio 200 mg bid. cMRI EF 29% c/w ischemic CM w/ coronary disease pattern LGE update/ scar. Also noted to have large posterior pericardial effusion w/o tamponade as well as moderate TR.   He had repeat Zio 2/23 while on amio + mexiletine. This showed reduction in PVC burden from 13 to 8.8%. He was subsequently referred to EP for PVC ablation. Seen by Dr. Elberta Fortis. Given concern for side effects w/ long term use of amio + pt preference, amiodarone was discontinued and mexiletine was increased to 300 mg bid. Dr. Elberta Fortis recommended repeat Zio to re quantify burden on monotherapy. Zio 6/23 19.1% PVCs. Underwent  PVC ablation 6/23  Following with Dr. Tresa Endo. Now on Bipap.   Dr. Tresa Endo following for OSA. AHI 18.1 started on bipap  Echo 11/10/21 EF 30-35%  Zio 9/23: 2.4% PVCs  Echo 9/23 EF 25-30%, mild LVH, grade 2 DD, RV mildly reduced  Zio 9/23 showed mostly NSR, rare PACs, 2.4% PVC burden  S/p MDT ICD placement 2/24.  Today he returns for HF follow up. Overall feeling fine. Occasional positional dizziness but no falls. He is not SOB with activity. Planning to go to the beach for the 4th for a week. Denies palpitations, CP,  edema, or PND/Orthopnea. Appetite ok. No fever or chills. Weight at home 158-160 pounds. Taking all medications, has not needed torsemide. Not wearing BiPap.  Cardiac Studies  - Zio (9/23): mostly NSR, rare PACs, 2.4% PVC burden  - Echo (9/23): EF 25-30%, mild LVH, grade 2 DD, RV mildly reduced  - Zio (2/23) - PVC burden 8.8%   - Echo (1/23): EF 30-35%, moderate LV dysfunction, global LV HK, RV mildly reduced, RVSP 30.7 mmHg, mild to mod MR, moderate TR   - R/LHC (1/23): occluded RCA and RCA vein graft with left-to-right collaterals.  The remainder of his grafts are patent.  All his native arteries were occluded.   Hemodynamics: 1: Right atrial pressure-17/17 2: Right ventricular pressure-51/23, mean 27 3: Pulmonary artery pressure-57/9, mean 35 4: Pulmonary wedge pressure-A-wave 28, V wave 24, mean 17 5: Cardiac output-3.7 L/min with an index of 2 L/min/m by Fick 6: LVEDP - 24  -  Cardiac MRI (08/04/21): Ischemic scar pattern.  LVEF 29% RVEF 41% Mod TR Large posterior pericardial effusion w/o tamponade   Past Medical History:  Diagnosis Date   CAD (coronary artery disease), s/p stent 2011 and CABG 2015 [facility])    Hodgkin disease [facility]) 1997   Hyperlipidemia    Macular degeneration    PVC's (premature ventricular contractions)    Current Outpatient Medications  Medication Sig Dispense Refill   acetaminophen (TYLENOL) 500 MG tablet Take 500 mg  by mouth every 6 (six) hours as needed for headache (pain).     ARIPiprazole (ABILIFY) 5 MG tablet Take 1 tablet (5 mg total) by mouth daily. 90 tablet 1   aspirin 81 MG tablet Take 81 mg by mouth daily.     Bevacizumab (AVASTIN IV) Inject 1 Dose into the eye See admin instructions. Every six to eight weeks in left eye     carvedilol (COREG) 3.125 MG tablet Take 1 tablet by mouth twice daily. 180 tablet 2   ciprofloxacin (CILOXAN) 0.3 % ophthalmic solution Place 1 drop into the left eye 4 (four) times daily. After Eye injection every 6-8 weeks for two days     dapagliflozin propanediol (FARXIGA) 10 MG TABS tablet TAKE 1 TABLET BY MOUTH DAILY BEFORE BREAKFAST 30 tablet 6   escitalopram (LEXAPRO) 20 MG tablet Take 1 tablet (20 mg total) by mouth daily. 90 tablet 1   levothyroxine (SYNTHROID) 50 MCG tablet Take 1 tablet by mouth daily. 90 tablet 0   LORazepam (ATIVAN) 0.5 MG tablet Take 1 tablet (0.5 mg total) by mouth 2 (two) times daily as needed for anxiety. 60 tablet 2   losartan (COZAAR) 25 MG tablet Take 1 tablet (25 mg total) by mouth daily. 90 tablet 3   metFORMIN (GLUCOPHAGE-XR) 500 MG 24 hr tablet Take 1 tablet (500 mg total) by mouth daily with breakfast.     rosuvastatin (CRESTOR) 20 MG tablet Take 1 tablet (20 mg total) by mouth daily. 90 tablet 3   sildenafil (VIAGRA) 100 MG tablet Take 1 tablet (100 mg total) by mouth daily as needed for erectile dysfunction. 10 tablet 5   spironolactone (ALDACTONE) 25 MG tablet TAKE 1/2 TABLET(12.5 MG) BY MOUTH AT BEDTIME 15 tablet 3   torsemide (DEMADEX) 20 MG tablet Take 20 mg by mouth daily as needed (swelling).     ursodiol (ACTIGALL) 500 MG tablet Take 500 mg by mouth 2 (two) times daily.     No current facility-administered medications for this visit.   No Known Allergies  Social History   Socioeconomic History   Marital status: Married    Spouse name: Not on file   Number of children: Not on file   Years of education: Not on file    Highest education level: Not on file  Occupational History   Not on file  Tobacco Use   Smoking status: Never   Smokeless tobacco: Former   Tobacco comments:    a long time ago  Vaping Use   Vaping status: Never Used  Substance and Sexual Activity   Alcohol use: Yes    Alcohol/week: 3.0 standard drinks of alcohol    Types: 3 Cans of beer per week    Comment: sometimes 3 beers daily   Drug use: No   Sexual activity: Yes  Other Topics Concern   Not on file  Social History Narrative   Not on file   Social Determinants of Health   Financial Resource Strain: Not on  file  Food Insecurity: Not on file  Transportation Needs: Not on file  Physical Activity: Not on file  Stress: Not on file  Social Connections: Not on file  Intimate Partner Violence: Not on file   Family History  Problem Relation Age of Onset   Stroke Mother    Cancer Mother    COPD Mother    Heart disease Father    COPD Father    Heart attack Father    Heart Problems Brother        heart valve replaced at 42 years old   Heart Problems Brother        MI at age 45; CABG at age 37   Cancer Maternal Grandmother    Colon cancer Neg Hx    There were no vitals taken for this visit.  Wt Readings from Last 3 Encounters:  04/19/23 71 kg (156 lb 8.4 oz)  04/12/23 71.8 kg (158 lb 6.4 oz)  01/12/23 73 kg (161 lb)   PHYSICAL EXAM: General:  NAD. No resp difficulty HEENT: Normal Neck: Supple. JVP 9-10 Carotids 2+ bilat; no bruits. No lymphadenopathy or thryomegaly appreciated. Cor: PMI nondisplaced. Regular rate & rhythm. No rubs, gallops or murmurs. Lungs: Clear Abdomen: Soft, nontender, nondistended. No hepatosplenomegaly. No bruits or masses. Good bowel sounds. Extremities: No cyanosis, clubbing, rash, edema Neuro: Alert & oriented x 3, cranial nerves grossly intact. Moves all 4 extremities w/o difficulty. Affect pleasant.  Device interrogation (personally reviewed): OptiVol up, thoracic impedence down, no  VT, no AF, 5.7 hr/day activity, < 0.1% VP  ASSESSMENT & PLAN:  1. Chronic Systolic Heart Failure  - prior h/o ischemic CM, that improved post revascularization. - EF 30-35% at time of CABG in 2015, EF normalized on repeat Echos in 2016 and 2017 (50-55%) - Echo (1/23): EF back down, 30-35%, RV mildly reduced.  - R/LHC (1/23) w/ occluded SVG-RCA but distal RCA filled by L>R collaterals. All other grafts patent. EF out of proportion to degree of CAD. RHC w/ elevated Rt sided filling pressures w/ fairly normal PCWP ~17. CO marginal, CI 2.0.  - Suspect PVC - mediated CM - cMRI (1/23) EF 29% with inferior and inferlolateral scar with some RV LGE with ? Sarcoid - Echo 11/10/21 EF 30-35%  - Repeat Zio 6/23 19.1% PVC - PVC ablation (6/23) - Echo (9/23): EF 25-30%, mild LVH, grade 2 DD, RV mildly reduced - Zio (9/23): mostly NSR, rare PACs, 2.4% PVC burden - s/p MDT ICD - Stable NYHA I-II, volume up a bit on device interrogation - Take torsemide 20 mg every other day x 1 week, then PRN thereafter. - Continue losartan 25 mg daily (BP too soft for Entresto) - Continue spiro 12.5 daily - Continue Farxiga 10 mg daily. - Continue Coreg 3.125 mg bid. - Arrange cardiac PET scanning to look for potential sarcoid - Labs today.   2. PVCs - High PVC burden, Holter 5/21 showed 13% burden.  - Suspect contributing to CM. - No coronary ischemia on cath.  - Zio 2/23 w/ burden down to 8.8% on dual AAD therapy w/ amio + mexilitine  - Zio 5/23 19.1% PVC - PVC ablation 6/23 - Zio 9/23: 2.4% PVCs - Encouraged BiPap   3. CAD - remote MI 2011, s/p PCI to pLAD - CABG x 4 in 2015 - LHC (1/23): w/ 3/4 patent grafts, occluded SVG- RCA w/ L>R collaterals.  - No s/s angina - Continue ASA + statin - Continue Coreg 6.25 mg bid  4. Pericardial effusion - Large posterior effusion on MRI without tamponade.  - Continue w/ diuretics. - Resolved on echo 5/23   5. HTN - BP stable - GDMT as above   6. OSA - Has  BiPap, followed by Dr. Tresa Endo - encouraged him to use more regualrly  7. Type 2DM  - Recent A1C 6.0 - on Metformin + Farxiga   Follow up in 4 months with Dr. Gala Romney  Jacklynn Ganong, FNP  1:38 PM

## 2023-05-10 ENCOUNTER — Encounter (HOSPITAL_COMMUNITY): Payer: Self-pay

## 2023-05-10 ENCOUNTER — Ambulatory Visit (HOSPITAL_COMMUNITY)
Admission: RE | Admit: 2023-05-10 | Discharge: 2023-05-10 | Disposition: A | Discharge status: Home / Self Care | Payer: BC Managed Care – PPO | Source: Ambulatory Visit | Attending: Family Medicine | Admitting: Family Medicine

## 2023-05-10 VITALS — BP 100/62 | HR 65 | Wt 160.6 lb

## 2023-05-10 DIAGNOSIS — L905 Scar conditions and fibrosis of skin: Secondary | ICD-10-CM | POA: Diagnosis not present

## 2023-05-10 DIAGNOSIS — Z955 Presence of coronary angioplasty implant and graft: Secondary | ICD-10-CM | POA: Diagnosis not present

## 2023-05-10 DIAGNOSIS — I3139 Other pericardial effusion (noninflammatory): Secondary | ICD-10-CM | POA: Diagnosis not present

## 2023-05-10 DIAGNOSIS — I2510 Atherosclerotic heart disease of native coronary artery without angina pectoris: Secondary | ICD-10-CM | POA: Diagnosis not present

## 2023-05-10 DIAGNOSIS — I493 Ventricular premature depolarization: Secondary | ICD-10-CM | POA: Diagnosis not present

## 2023-05-10 DIAGNOSIS — I255 Ischemic cardiomyopathy: Secondary | ICD-10-CM | POA: Insufficient documentation

## 2023-05-10 DIAGNOSIS — G4733 Obstructive sleep apnea (adult) (pediatric): Secondary | ICD-10-CM | POA: Diagnosis not present

## 2023-05-10 DIAGNOSIS — I252 Old myocardial infarction: Secondary | ICD-10-CM | POA: Diagnosis not present

## 2023-05-10 DIAGNOSIS — Z79899 Other long term (current) drug therapy: Secondary | ICD-10-CM | POA: Insufficient documentation

## 2023-05-10 DIAGNOSIS — I110 Hypertensive heart disease with heart failure: Secondary | ICD-10-CM | POA: Insufficient documentation

## 2023-05-10 DIAGNOSIS — E119 Type 2 diabetes mellitus without complications: Secondary | ICD-10-CM | POA: Diagnosis not present

## 2023-05-10 DIAGNOSIS — Z7982 Long term (current) use of aspirin: Secondary | ICD-10-CM | POA: Diagnosis not present

## 2023-05-10 DIAGNOSIS — I10 Essential (primary) hypertension: Secondary | ICD-10-CM

## 2023-05-10 DIAGNOSIS — Z7984 Long term (current) use of oral hypoglycemic drugs: Secondary | ICD-10-CM | POA: Insufficient documentation

## 2023-05-10 DIAGNOSIS — Z95810 Presence of automatic (implantable) cardiac defibrillator: Secondary | ICD-10-CM | POA: Diagnosis not present

## 2023-05-10 DIAGNOSIS — I5022 Chronic systolic (congestive) heart failure: Secondary | ICD-10-CM | POA: Diagnosis not present

## 2023-05-10 LAB — BASIC METABOLIC PANEL
Anion gap: 9 (ref 5–15)
BUN: 20 mg/dL (ref 6–20)
CO2: 25 mmol/L (ref 22–32)
Calcium: 9.5 mg/dL (ref 8.9–10.3)
Chloride: 104 mmol/L (ref 98–111)
Creatinine, Ser: 1.1 mg/dL (ref 0.61–1.24)
GFR, Estimated: >60 mL/min (ref >60)
Glucose, Bld: 89 mg/dL (ref 70–99)
Potassium: 4.2 mmol/L (ref 3.5–5.1)
Sodium: 138 mmol/L (ref 135–145)

## 2023-05-10 LAB — BRAIN NATRIURETIC PEPTIDE: B Natriuretic Peptide: 270.8 pg/mL — ABNORMAL HIGH (ref 0.0–100.0)

## 2023-05-10 NOTE — Patient Instructions (Addendum)
Thank you for coming in today  If you had labs drawn today, any labs that are abnormal the clinic will call you No news is good news  Medications: Take Torsemide 20 mg every other day for a week then resume as need For weight gain of 3 lbs in 24 hours or 5 lbs in a week   Follow up appointments:  Please call Dr. Hazle Coca office for [facility] [phone]  Your physician recommends that you schedule a follow-up appointment in:  4 months With Dr. Luanna Cole will receive a reminder letter in the mail a few months in advance. If you don't receive a letter, please call our office to schedule the follow-up appointment.  Your physician has requested that you have an echocardiogram. Echocardiography is a painless test that uses sound waves to create images of your heart. It provides your doctor with information about the size and shape of your heart and how well your heart's chambers and valves are working. This procedure takes approximately one hour. There are no restrictions for this procedure.      Do the following things EVERYDAY: Weigh yourself in the morning before breakfast. Write it down and keep it in a log. Take your medicines as prescribed Eat low salt foods--Limit salt (sodium) to 2000 mg per day.  Stay as active as you can everyday Limit all fluids for the day to less than 2 liters   At the [facility], you and your health needs are our priority. As part of our continuing mission to provide you with exceptional heart care, we have created designated Provider Care Teams. These Care Teams include your primary Cardiologist (physician) and Advanced Practice Providers (APPs- Physician Assistants and Nurse Practitioners) who all work together to provide you with the care you need, when you need it.   You may see any of the following providers on your designated Care Team at your next follow up: Dr Arvilla Meres Dr Marca Ancona Dr. Marcos Eke,  NP Robbie Lis, [place] [facility] [place], [place] Brynda Peon, NP Karle Plumber, PharmD   Please be sure to bring in all your medications bottles to every appointment.    Thank you for choosing [facility]  If you have any questions or concerns before your next appointment please send [place] a message through [place] or call our office at [phone].    TO LEAVE A MESSAGE FOR THE NURSE SELECT OPTION 2, PLEASE LEAVE A MESSAGE INCLUDING: YOUR NAME DATE OF BIRTH CALL BACK NUMBER REASON FOR CALL**this is important as we prioritize the call backs  YOU WILL RECEIVE A CALL BACK THE SAME DAY AS LONG AS YOU CALL BEFORE 4:00 PM

## 2023-05-11 NOTE — Addendum Note (Signed)
Encounter addended by: Demetrius Charity, RN on: 05/11/2023 4:04 PM  Actions taken: Charge Capture section accepted

## 2023-05-12 ENCOUNTER — Encounter (INDEPENDENT_AMBULATORY_CARE_PROVIDER_SITE_OTHER): Payer: BC Managed Care – PPO | Admitting: Ophthalmology

## 2023-05-12 DIAGNOSIS — H35033 Hypertensive retinopathy, bilateral: Secondary | ICD-10-CM

## 2023-05-12 DIAGNOSIS — H43813 Vitreous degeneration, bilateral: Secondary | ICD-10-CM

## 2023-05-12 DIAGNOSIS — H353221 Exudative age-related macular degeneration, left eye, with active choroidal neovascularization: Secondary | ICD-10-CM | POA: Diagnosis not present

## 2023-05-12 DIAGNOSIS — H348322 Tributary (branch) retinal vein occlusion, left eye, stable: Secondary | ICD-10-CM

## 2023-05-12 DIAGNOSIS — I10 Essential (primary) hypertension: Secondary | ICD-10-CM | POA: Diagnosis not present

## 2023-05-12 DIAGNOSIS — H2513 Age-related nuclear cataract, bilateral: Secondary | ICD-10-CM

## 2023-05-13 ENCOUNTER — Other Ambulatory Visit: Payer: Self-pay | Admitting: Family Medicine

## 2023-05-17 ENCOUNTER — Other Ambulatory Visit: Payer: Self-pay

## 2023-05-17 ENCOUNTER — Other Ambulatory Visit (HOSPITAL_COMMUNITY): Payer: Self-pay

## 2023-05-17 MED ORDER — CIPROFLOXACIN HCL 0.3 % OP SOLN
1.0000 [drp] | Freq: Four times a day (QID) | OPHTHALMIC | 1 refills | Status: AC
  Filled 2023-05-17: qty 5, 25d supply, fill #0

## 2023-05-19 ENCOUNTER — Other Ambulatory Visit: Payer: Self-pay | Admitting: Cardiovascular Disease

## 2023-05-19 ENCOUNTER — Other Ambulatory Visit (HOSPITAL_COMMUNITY): Payer: Self-pay | Admitting: Internal Medicine

## 2023-05-19 ENCOUNTER — Other Ambulatory Visit: Payer: Self-pay

## 2023-05-19 MED ORDER — CARVEDILOL 3.125 MG PO TABS: ORAL_TABLET | ORAL | 0 refills | Status: DC

## 2023-05-20 ENCOUNTER — Encounter: Payer: Self-pay | Admitting: Internal Medicine

## 2023-05-23 ENCOUNTER — Telehealth (HOSPITAL_COMMUNITY): Payer: Self-pay | Admitting: *Deleted

## 2023-05-27 ENCOUNTER — Ambulatory Visit (HOSPITAL_COMMUNITY)
Admission: RE | Admit: 2023-05-27 | Discharge: 2023-05-27 | Disposition: A | Discharge status: Home / Self Care | Payer: BC Managed Care – PPO | Source: Ambulatory Visit | Attending: Family Medicine | Admitting: Family Medicine

## 2023-05-27 DIAGNOSIS — I5022 Chronic systolic (congestive) heart failure: Secondary | ICD-10-CM | POA: Insufficient documentation

## 2023-05-27 DIAGNOSIS — E785 Hyperlipidemia, unspecified: Secondary | ICD-10-CM | POA: Diagnosis not present

## 2023-05-27 LAB — ECHOCARDIOGRAM COMPLETE
AR max vel: 1.37 cm2
AV Area VTI: 1.72 cm2
AV Area mean vel: 1.52 cm2
AV Mean grad: 3 mm[Hg]
AV Peak grad: 6.1 mm[Hg]
Ao pk vel: 1.23 m/s
Area-P 1/2: 4.23 cm2
Calc EF: 47.7 %
MV VTI: 1.82 cm2
S' Lateral: 3.3 cm
Single Plane A2C EF: 48.7 %
Single Plane A4C EF: 46.8 %

## 2023-05-27 NOTE — Progress Notes (Signed)
  Echocardiogram 2D Echocardiogram has been performed.  Shane [name] Shane [name] 05/27/2023, 11:52 AM

## 2023-06-08 DIAGNOSIS — G4733 Obstructive sleep apnea (adult) (pediatric): Secondary | ICD-10-CM | POA: Diagnosis not present

## 2023-06-14 ENCOUNTER — Ambulatory Visit: Payer: BC Managed Care – PPO | Admitting: Cardiology

## 2023-06-17 ENCOUNTER — Ambulatory Visit: Payer: BC Managed Care – PPO

## 2023-06-17 DIAGNOSIS — I255 Ischemic cardiomyopathy: Secondary | ICD-10-CM | POA: Diagnosis not present

## 2023-06-18 ENCOUNTER — Other Ambulatory Visit: Payer: Self-pay | Admitting: Family Medicine

## 2023-06-19 LAB — CUP PACEART REMOTE DEVICE CHECK
Battery Remaining Longevity: 160 mo
Battery Voltage: 3.02 V
Brady Statistic RA Percent Paced: INVALID
Brady Statistic RV Percent Paced: 0.03 %
Date Time Interrogation Session: 20241206215847
HighPow Impedance: 56 Ohm
Implantable Lead Connection Status: 753985
Implantable Lead Implant Date: 20240228
Implantable Lead Location: 753860
Implantable Pulse Generator Implant Date: 20240228
Lead Channel Impedance Value: 285 Ohm
Lead Channel Impedance Value: 342 Ohm
Lead Channel Pacing Threshold Amplitude: 0.625 V
Lead Channel Pacing Threshold Pulse Width: 0.4 ms
Lead Channel Sensing Intrinsic Amplitude: 12.5 mV
Lead Channel Setting Pacing Amplitude: 2 V
Lead Channel Setting Pacing Pulse Width: 0.4 ms
Lead Channel Setting Sensing Sensitivity: 0.3 mV
Zone Setting Status: 755011

## 2023-06-23 ENCOUNTER — Other Ambulatory Visit: Payer: Self-pay | Admitting: Cardiovascular Disease

## 2023-06-23 LAB — HM DIABETES EYE EXAM

## 2023-06-28 ENCOUNTER — Ambulatory Visit: Payer: BC Managed Care – PPO | Admitting: Psychiatry

## 2023-06-28 DIAGNOSIS — F411 Generalized anxiety disorder: Secondary | ICD-10-CM | POA: Diagnosis not present
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
## 2023-07-03 IMAGING — US US SCROTUM W/ DOPPLER COMPLETE
1 series · 14 of 25 positions shown · non-contrast
Comparison: None.

CLINICAL DATA: Testicular swelling for 3 months

EXAM:
SCROTAL ULTRASOUND
DOPPLER ULTRASOUND OF THE TESTICLES
TECHNIQUE: Complete ultrasound examination of the testicles, epididymis, and
other scrotal structures was performed. Color and spectral Doppler
ultrasound were also utilized to evaluate blood flow to the
testicles.

[Series 1: us scrotum w/ doppler complete · 0.09mm/px · 14 of 52 slices shown]
[im 1/52]
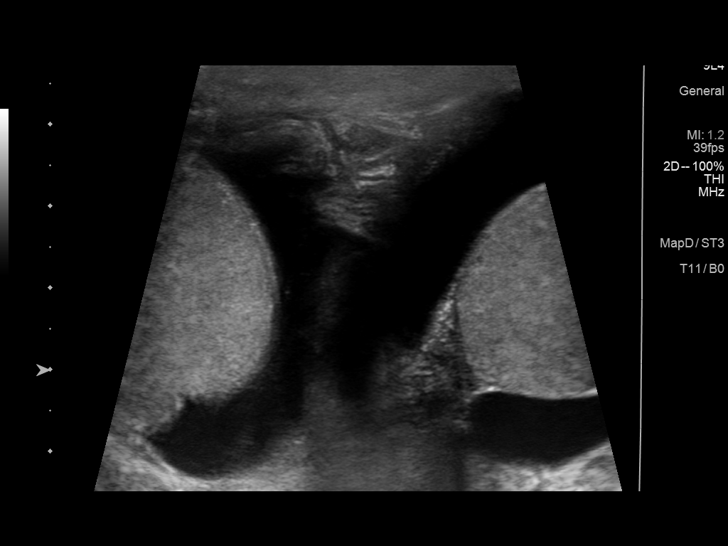
[im 5/52]
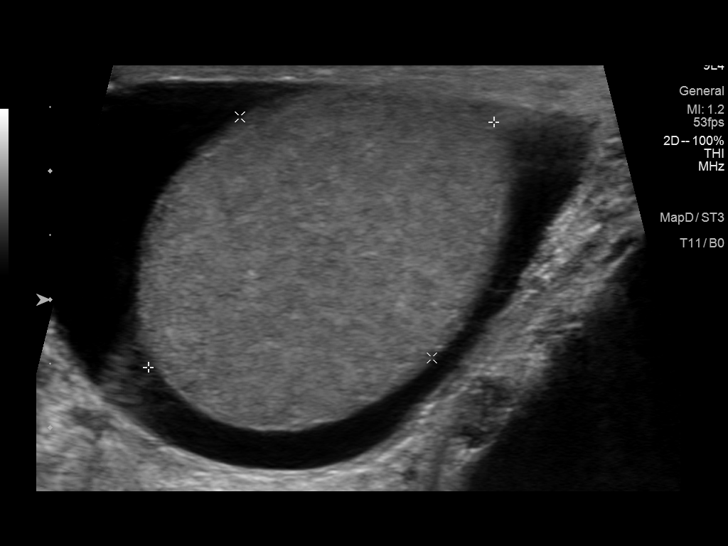
[im 9/52]
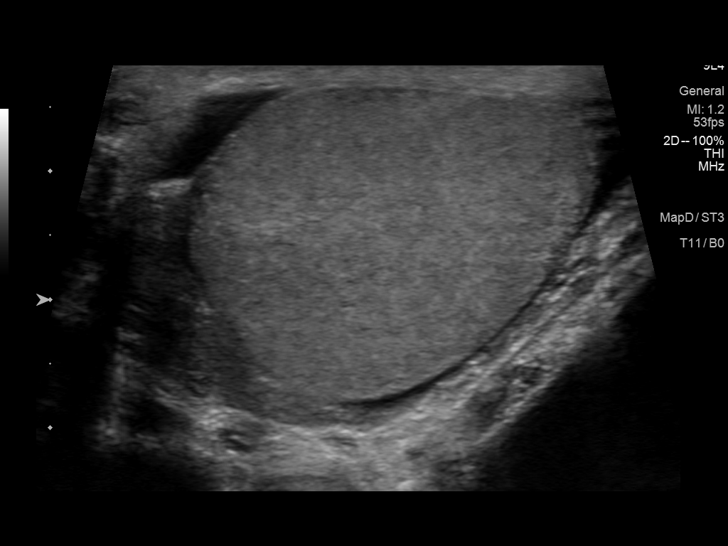
[im 13/52]
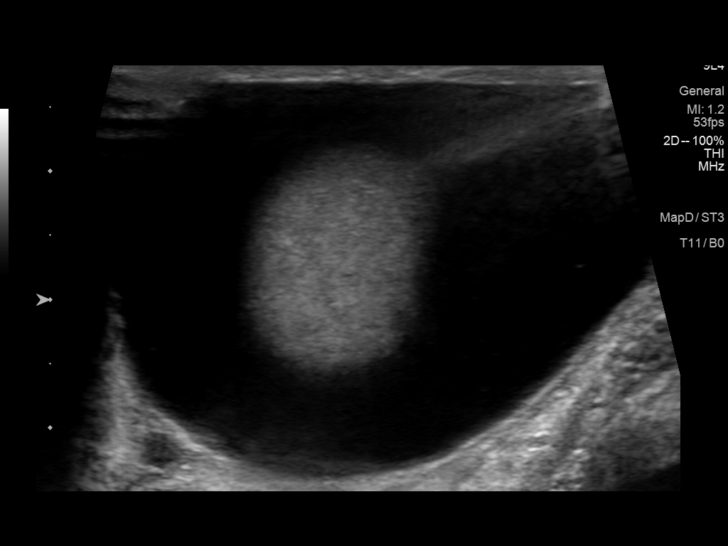
[im 18/52]
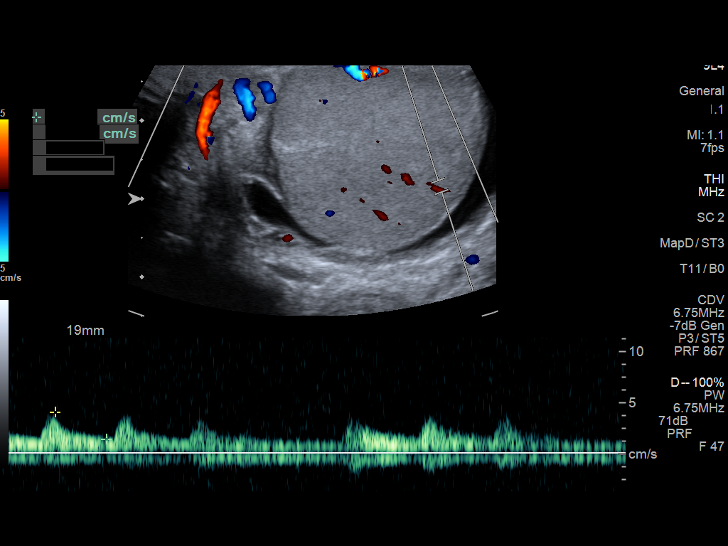
[im 20/52]
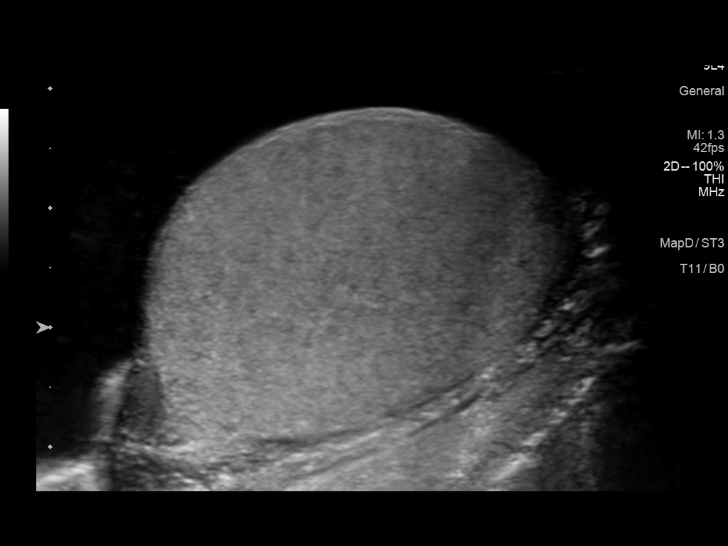
[im 24/52]
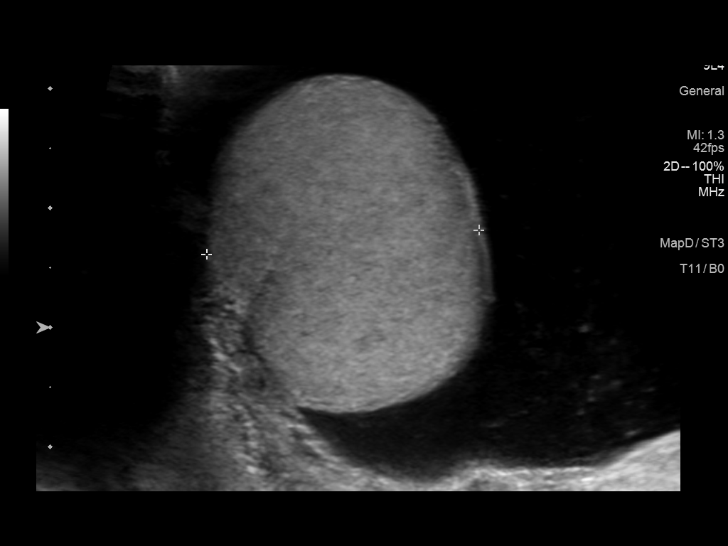
[im 28/52]
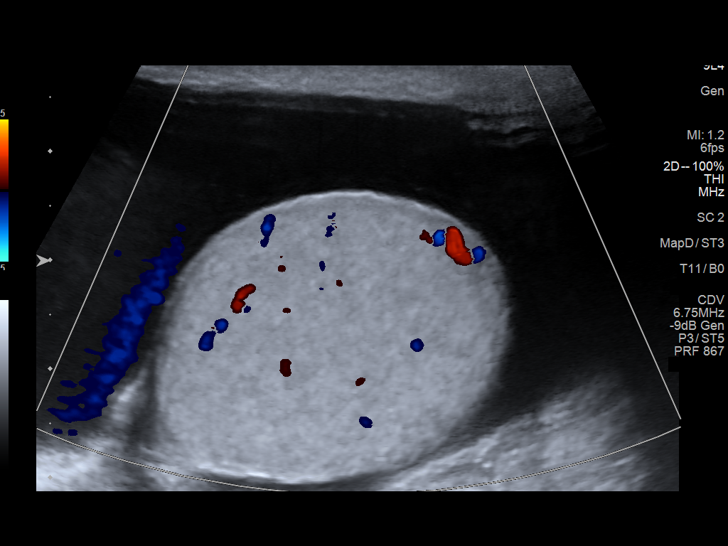
[im 32/52]
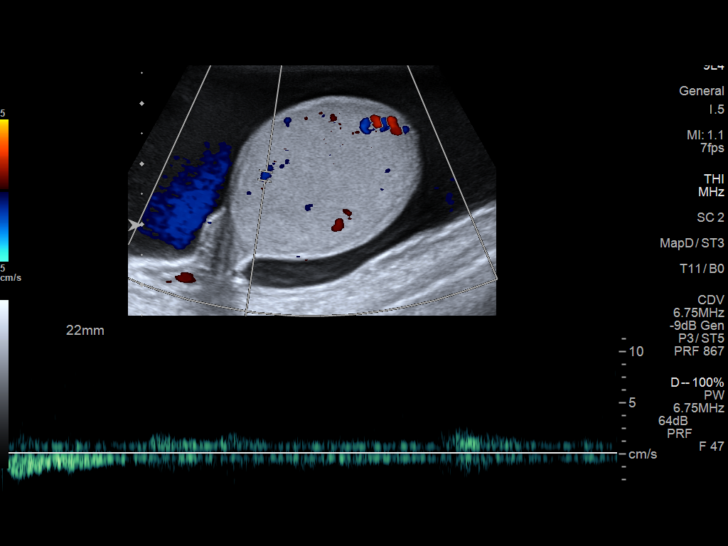
[im 35/52]
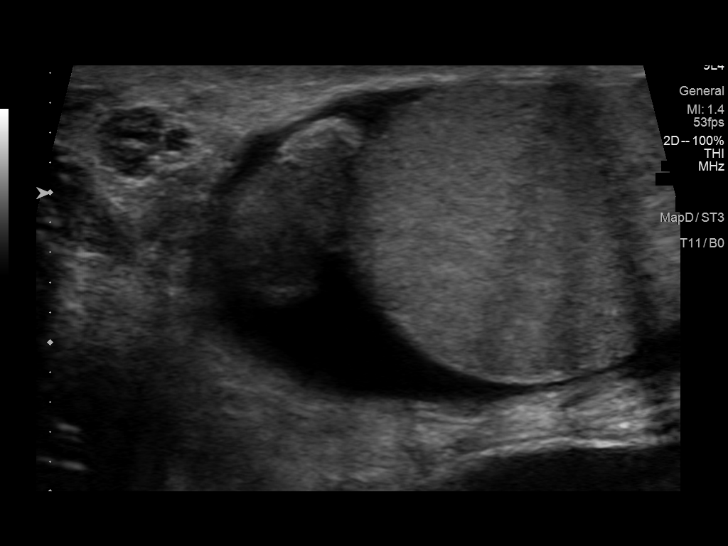
[im 39/52]
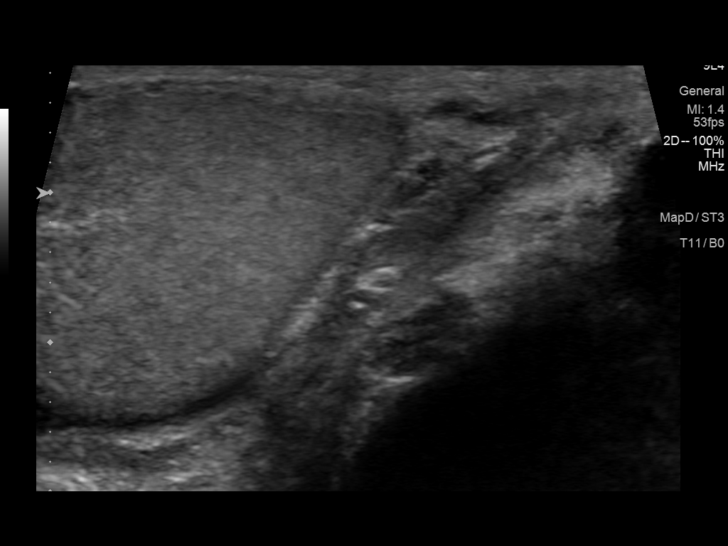
[im 43/52]
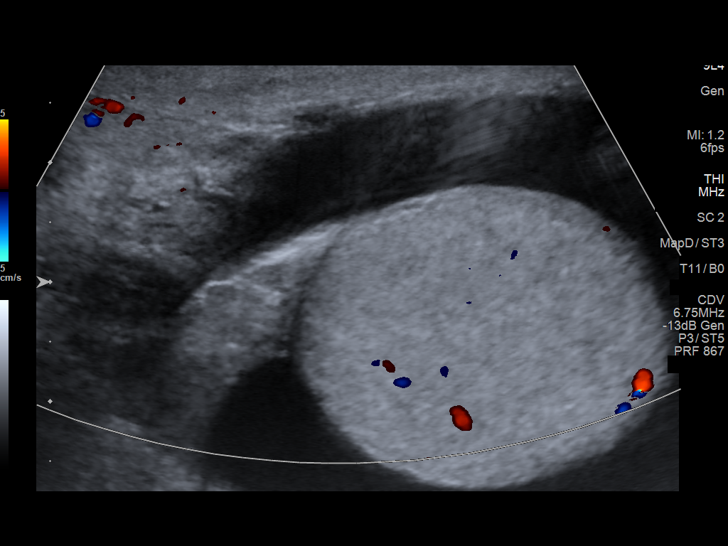
[im 47/52]
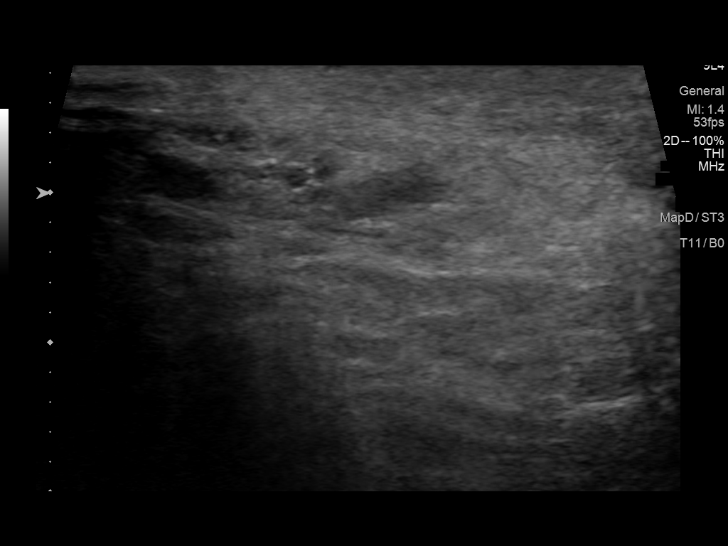
[im 52/52]
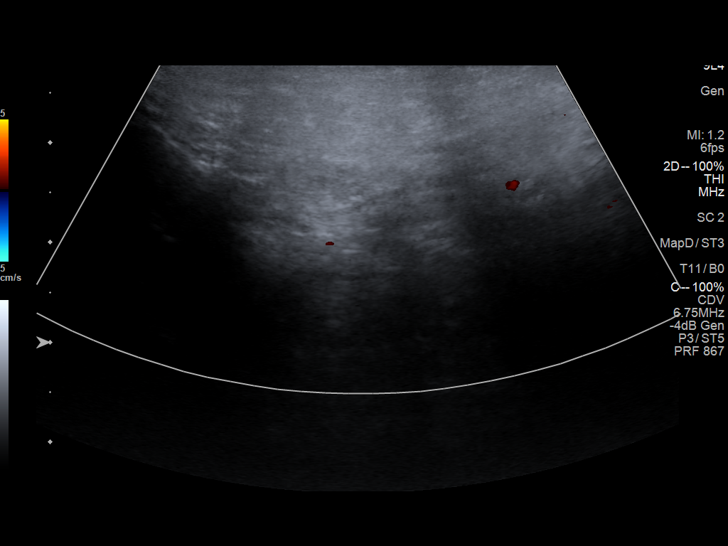

[14 of 25 positions shown; findings below may reference images not displayed]

FINDINGS: Right testicle

Measurements: 3.3 x 2.4 x 2.5 cm. No mass or microlithiasis
visualized.

Left testicle

Measurements: 3.7 x 2.5 x 2.3 cm. No mass or microlithiasis
visualized.

Right epididymis:  Normal in size and appearance.

Left epididymis:  Normal in size and appearance.

Hydrocele:  Moderate bilateral hydroceles.

Varicocele:  None visualized.

Pulsed Doppler interrogation of both testes demonstrates normal low
resistance arterial and venous waveforms bilaterally.
IMPRESSION: 1.  Moderate bilateral hydroceles.

2.  Normal size and ultrasound appearance of the testicles proper.

## 2023-07-04 IMAGING — US US ABDOMEN COMPLETE
1 series · 13 of 25 positions shown · non-contrast
Comparison: None.

CLINICAL DATA: 55-year-old male with hyperbilirubinemia.

EXAM:
ABDOMEN ULTRASOUND COMPLETE

[Series 1: us abdomen complete · 0.14mm/px · 13 of 76 slices shown]
[im 1/76]
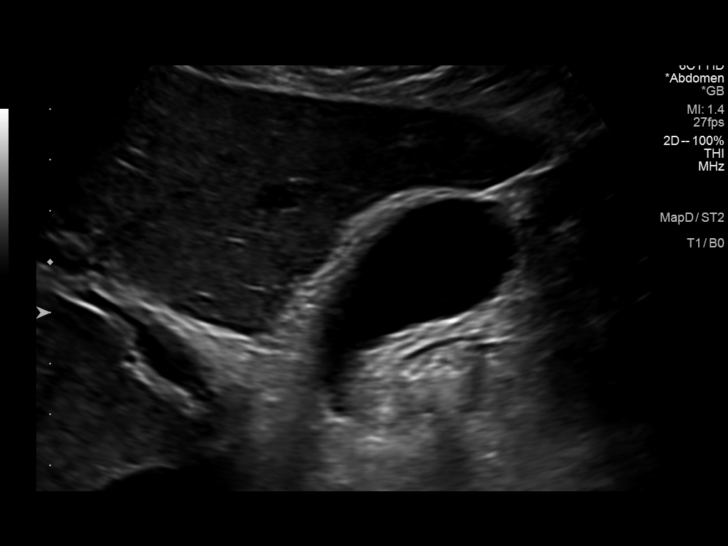
[im 7/76]
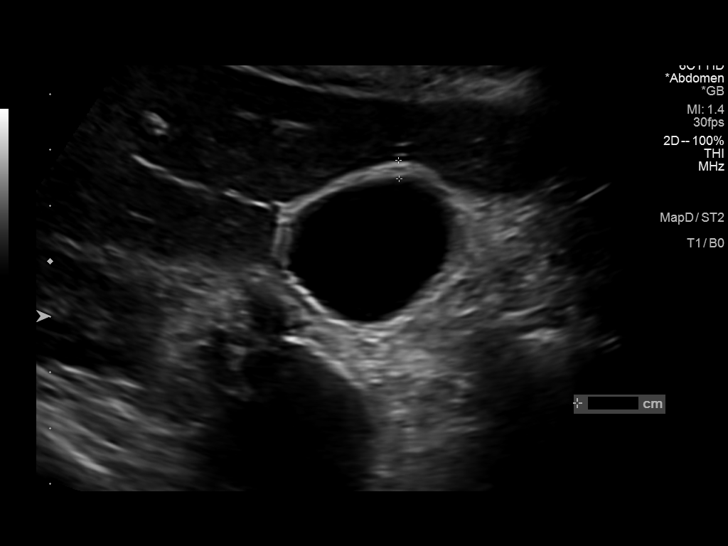
[im 13/76]
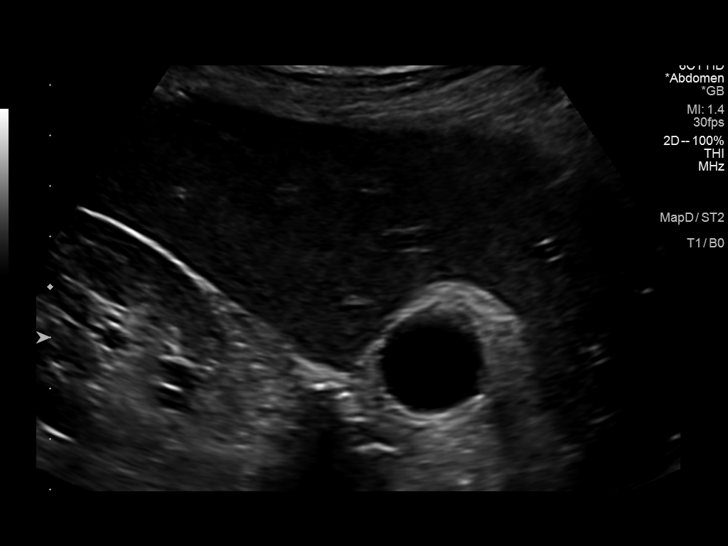
[im 19/76]
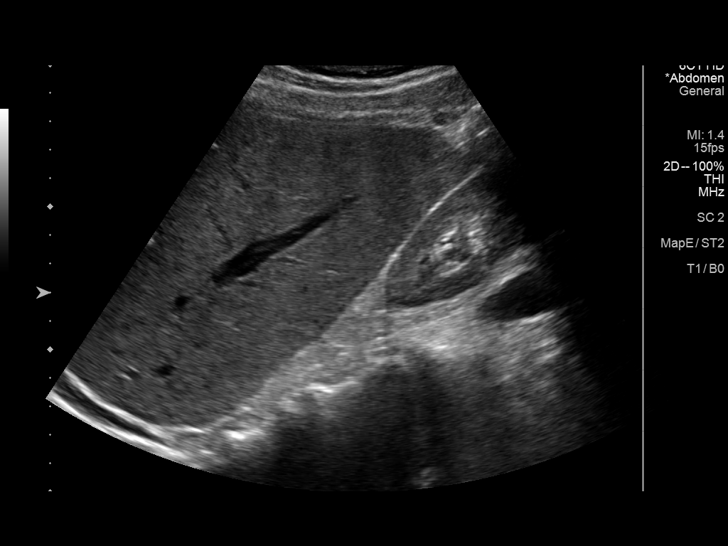
[im 26/76]
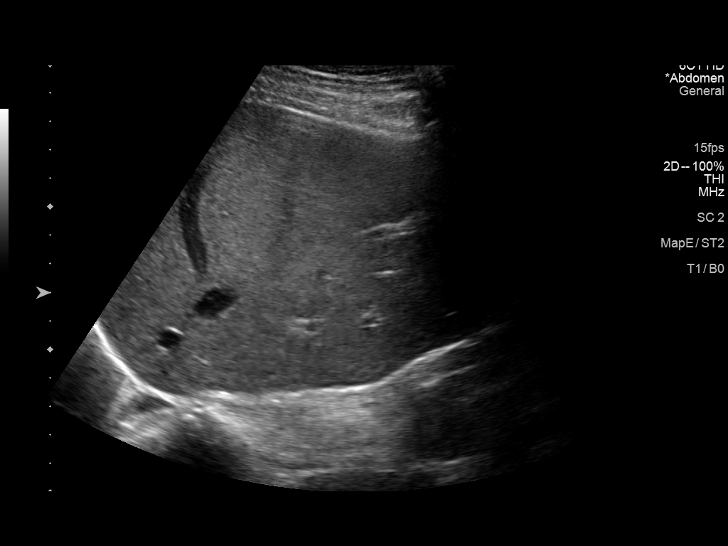
[im 32/76]
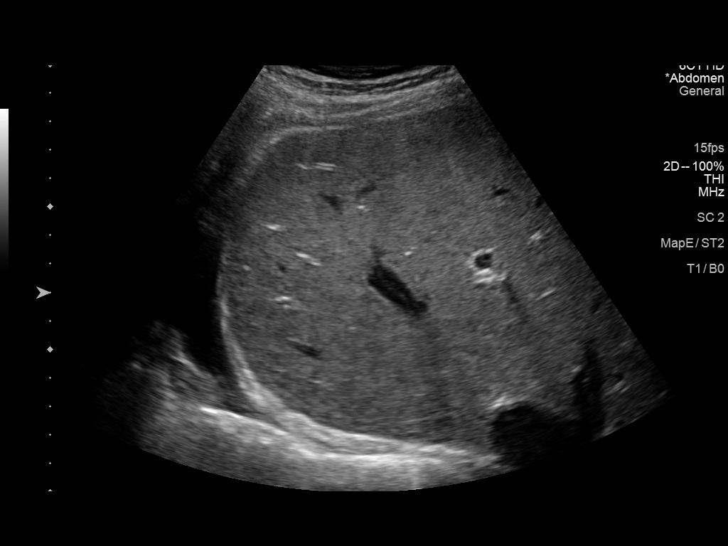
[im 38/76]
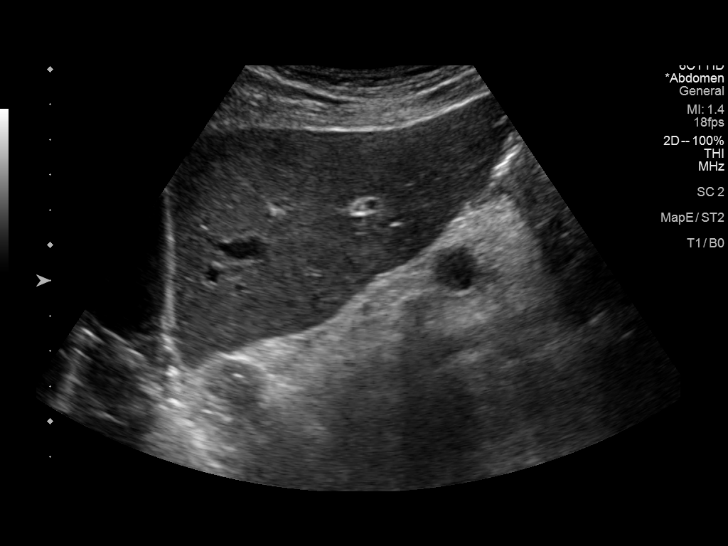
[im 44/76]
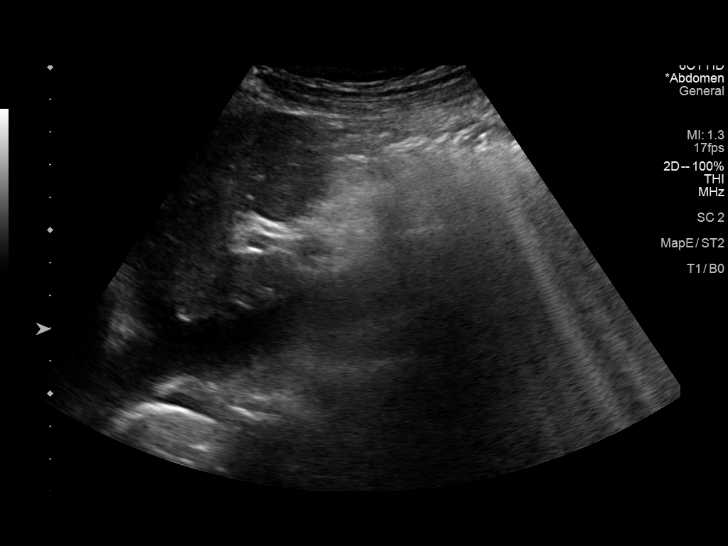
[im 51/76]
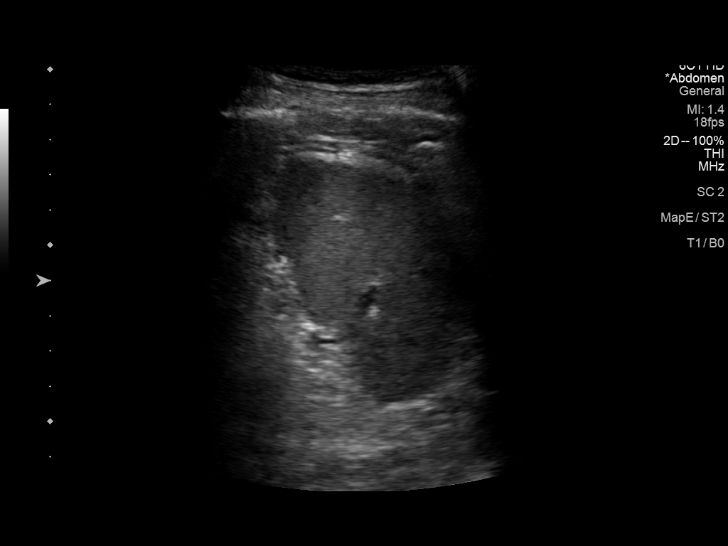
[im 57/76]
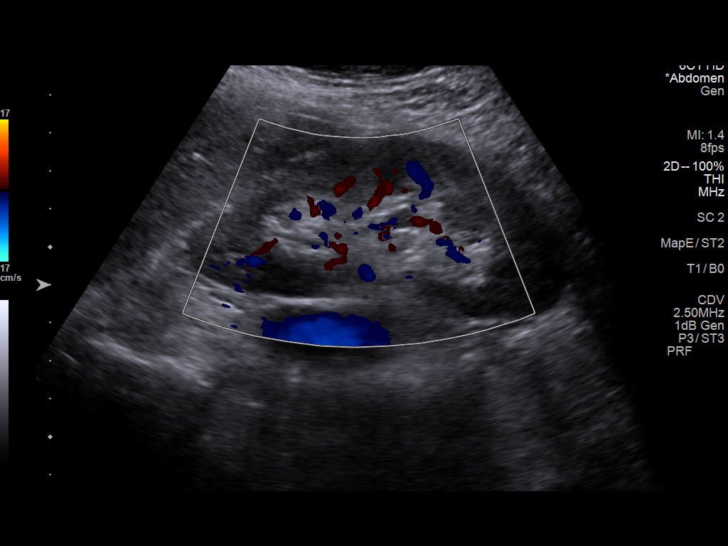
[im 63/76]
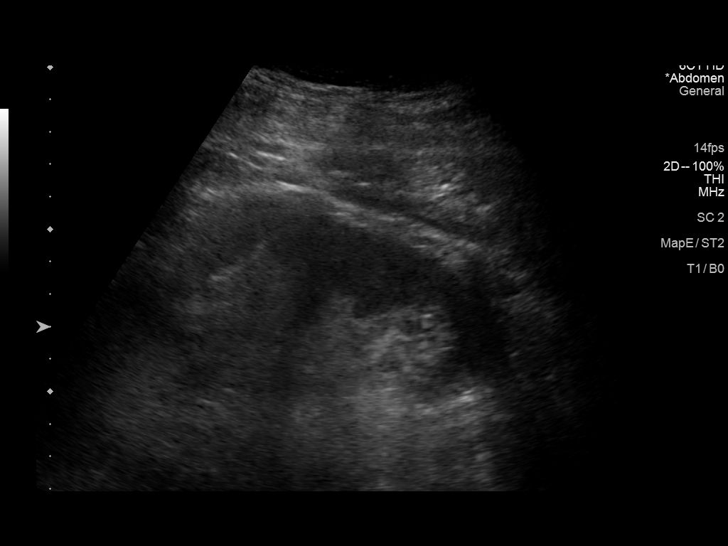
[im 69/76]
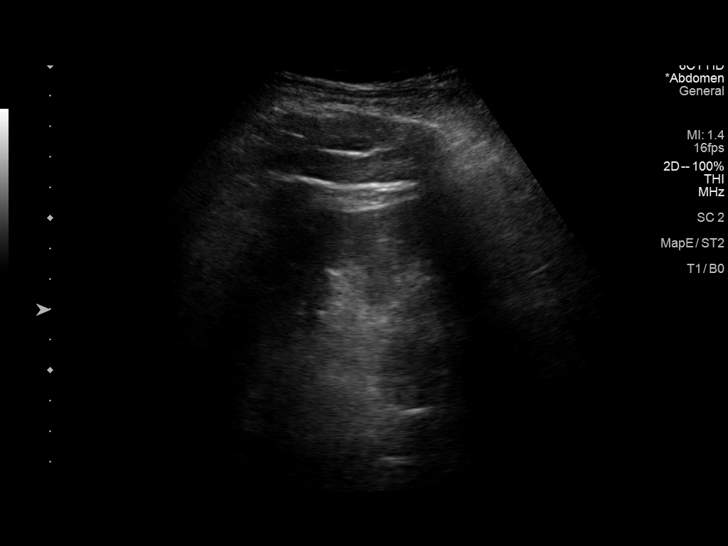
[im 76/76]
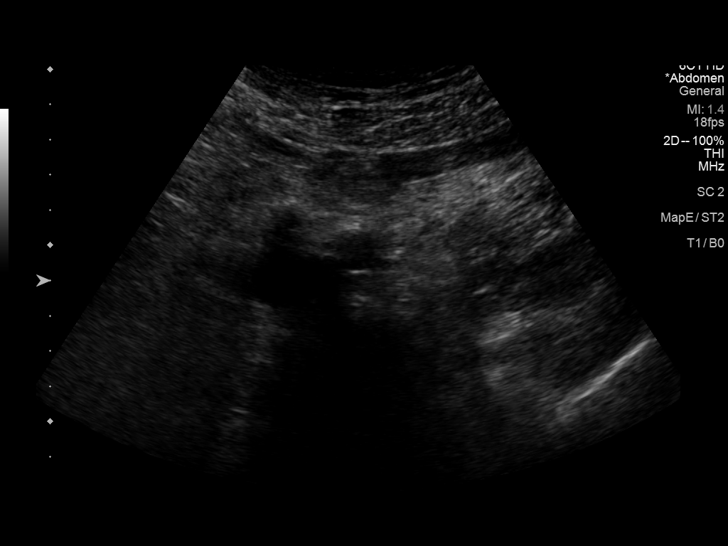

[13 of 25 positions shown; findings below may reference images not displayed]

FINDINGS: Gallbladder: Abnormal gallbladder wall thickening up to 6 mm (images
2, 5. But the gallbladder lumen appears free of echogenic sludge or
stones. No pericholecystic fluid. No sonographic Murphy sign
elicited.

Common bile duct: Diameter: 2 mm, normal.

Liver: Mildly coarse hepatic echotexture. Background liver
echogenicity within normal limits (image 20). No discrete liver
lesion. No intrahepatic biliary ductal dilatation. Portal vein is
patent on color Doppler imaging with normal direction of blood flow
towards the liver.

IVC: No abnormality visualized.

Pancreas: Visualized portion unremarkable.

Spleen: Size and appearance within normal limits.

Right Kidney: Length: 11.2 cm. Echogenicity within normal limits. No
mass or hydronephrosis visualized.

Left Kidney: Length: 11.7 cm. Echogenicity within normal limits. No
mass or hydronephrosis visualized.

Abdominal aorta: No aneurysm visualized.

Other findings: A right pleural effusion is visible on image 33. No
ascites.
IMPRESSION: 1. Coarse liver echogenicity with no discrete liver lesion.
Appearance raises the possibility of Chronic Hepatocellular Disease.
But there is no evidence of bile duct obstruction.

2. However, there is abnormal gallbladder wall thickening, but no
evidence of sludge or stones. And no sonographic Murphy sign to
suggest acute cholecystitis. Instead favor this wall thickening is
reactive secondary to #1.

3. Positive also for a small Right Pleural Effusion.

## 2023-07-06 ENCOUNTER — Other Ambulatory Visit: Payer: Self-pay | Admitting: Cardiovascular Disease

## 2023-07-06 ENCOUNTER — Other Ambulatory Visit: Payer: Self-pay | Admitting: Family Medicine

## 2023-07-07 ENCOUNTER — Other Ambulatory Visit: Payer: Self-pay

## 2023-07-07 ENCOUNTER — Encounter: Payer: Self-pay | Admitting: Family Medicine

## 2023-07-07 MED ORDER — CARVEDILOL 3.125 MG PO TABS: 3.1250 mg | ORAL_TABLET | Freq: Two times a day (BID) | ORAL | 0 refills | Status: DC

## 2023-07-07 MED ORDER — LEVOTHYROXINE SODIUM 50 MCG PO TABS: 50.0000 ug | ORAL_TABLET | Freq: Every day | ORAL | 0 refills | Status: DC

## 2023-07-07 NOTE — Telephone Encounter (Signed)
Spoke with pt, informed pt that this medication was called in on 06/20/23 to [facility]- pt stated the medication was denied. I called in the medication again and Informed pt that if he receives another kickback from the pharmacy to contact us back and we would then have to call the pharmacy. Pt expressed clear understanding.

## 2023-07-07 NOTE — Telephone Encounter (Signed)
Patient scheduled with Dr Allyson Sabal for 1/29 for follow up. Carvedilol prescription sent to pharmacy per request.

## 2023-07-14 ENCOUNTER — Encounter (INDEPENDENT_AMBULATORY_CARE_PROVIDER_SITE_OTHER): Payer: BC Managed Care – PPO | Admitting: Ophthalmology

## 2023-07-14 DIAGNOSIS — H353221 Exudative age-related macular degeneration, left eye, with active choroidal neovascularization: Secondary | ICD-10-CM | POA: Diagnosis not present

## 2023-07-14 DIAGNOSIS — H43813 Vitreous degeneration, bilateral: Secondary | ICD-10-CM

## 2023-07-14 DIAGNOSIS — H32 Chorioretinal disorders in diseases classified elsewhere: Secondary | ICD-10-CM

## 2023-07-14 DIAGNOSIS — B399 Histoplasmosis, unspecified: Secondary | ICD-10-CM

## 2023-07-14 DIAGNOSIS — I10 Essential (primary) hypertension: Secondary | ICD-10-CM

## 2023-07-14 DIAGNOSIS — H348322 Tributary (branch) retinal vein occlusion, left eye, stable: Secondary | ICD-10-CM

## 2023-07-14 DIAGNOSIS — H35033 Hypertensive retinopathy, bilateral: Secondary | ICD-10-CM

## 2023-07-24 ENCOUNTER — Other Ambulatory Visit: Payer: Self-pay | Admitting: Cardiovascular Disease

## 2023-07-25 ENCOUNTER — Other Ambulatory Visit: Payer: Self-pay

## 2023-07-25 MED ORDER — CARVEDILOL 3.125 MG PO TABS: 3.1250 mg | ORAL_TABLET | Freq: Two times a day (BID) | ORAL | 0 refills | Status: DC

## 2023-08-05 ENCOUNTER — Other Ambulatory Visit: Payer: Self-pay | Admitting: *Deleted

## 2023-08-05 DIAGNOSIS — I5022 Chronic systolic (congestive) heart failure: Secondary | ICD-10-CM

## 2023-08-05 NOTE — Progress Notes (Signed)
Incorrect order placed cPET perfusion, order is supposed to be for cPET Sarcoid, new order placed

## 2023-08-09 ENCOUNTER — Encounter (HOSPITAL_COMMUNITY): Payer: BC Managed Care – PPO

## 2023-08-10 ENCOUNTER — Ambulatory Visit: Payer: BC Managed Care – PPO | Attending: Cardiovascular Disease | Admitting: Cardiovascular Disease

## 2023-08-10 ENCOUNTER — Encounter: Payer: Self-pay | Admitting: Cardiovascular Disease

## 2023-08-10 VITALS — BP 104/64 | HR 67 | Ht 67.5 in | Wt 160.4 lb

## 2023-08-10 DIAGNOSIS — Z951 Presence of aortocoronary bypass graft: Secondary | ICD-10-CM

## 2023-08-10 DIAGNOSIS — I519 Heart disease, unspecified: Secondary | ICD-10-CM | POA: Diagnosis not present

## 2023-08-10 DIAGNOSIS — I493 Ventricular premature depolarization: Secondary | ICD-10-CM | POA: Diagnosis not present

## 2023-08-10 DIAGNOSIS — E782 Mixed hyperlipidemia: Secondary | ICD-10-CM

## 2023-08-10 NOTE — Assessment & Plan Note (Signed)
History of dyslipidemia on statin therapy with lipid profile performed/2/24 revealing total cholesterol of 158, LDL 63 and HDL 64.

## 2023-08-10 NOTE — Assessment & Plan Note (Signed)
History of LV dysfunction thought to be ischemic.  He is on GDMT.  He had an ICD placed for primary prevention by Dr. Elberta Fortis 09/08/2022 with no discharges.  He is on GDMT with recent echo performed 05/27/2023 that revealed EF of 40 to 45% with global hypokinesia.

## 2023-08-10 NOTE — Assessment & Plan Note (Signed)
History of CAD status post anterior STEMI 08/28/2009 treated with LAD stenting.  He ultimately went CABG 02/27/2014 with a LIMA to his LAD, vein to ramus branch, obtuse marginal branch and RCA.  His most recent cath which I performed 08/03/2021 was notable for an occluded dominant RCA with an occluded vein graft to the right, left-to-right collaterals and otherwise patent grafts.  He is completely asymptomatic.

## 2023-08-10 NOTE — Assessment & Plan Note (Signed)
History of frequent PVCs with a burden of 2.4%.  He did have PVC ablation by Dr. Elberta Fortis.  Prior to that he had been on mexiletine which she did not tolerate amiodarone.

## 2023-08-10 NOTE — Progress Notes (Signed)
08/10/2023 Shane [name]   Apr 09, 1966  [id number]  Primary Physician Willow Ora, MD Primary Cardiologist: Runell Gess MD Nicholes Calamity, [place]  HPI:  Shane [name] is a 58 y.o.   thin appearing married Caucasian male father of 2 children [name] is also a patient of mine who accompanies him today.    I last saw him in the office 08/18/2021.  He works as an Copywriter, advertising. His primary care physician is Dr. Cheri Rous. Marland Kitchen His cardiac risk factor profile is remarkable for hyperlipidemia and family history the father who had bypass surgery at age 16 and a brother who had bypass surgery as well. He suffered an anterior wall myocardial infarction 08/28/09 with a Xience DES stent placed in his proximal LAD. Because of recurrent symptoms he underwent catheterization in August and ultimately coronary bypass grafting 02/27/14 with a LIMA to his LAD, a vein to ramus branch, obtuse marginal branch and the RCA. His ejection fraction was 35%. He recuperated nicely although he did not participate in cardiac rehabilitation. A 2-D echocardiogram performed 08/27/14 revealed an improvement in his ejection fraction up to 50-55% with anterolateral wall motion abnormality. While honeymooning in [place] he noticed prolonged recovery from exercise activity as well as "just not feeling right". He denies chest pain. I performed a Myoview stress test on him after that on 06/28/16 which was low risk and nonischemic and 2-D echo that revealed preserved LV function with EF of 50-55%. Those symptoms have since subsided.   He has been diagnosed with primary biliary's cirrhosis and is undergoing evaluation at [facility].  He apparently had a recent liver biopsy.  He has lost 12 pounds since I saw him.  He complains of nausea and has been unable to eat a normal diet.  He was complaining of fairly new onset dyspnea on exertion.   I did get a 2D echocardiogram that shows a new  decline in LV function to 30 to 35% with mild to moderate MR, moderate TR.  His last echo in our system 07/02/2016 revealed a normal EF and normal valvular function.  I performed right and left heart cath on him 08/03/2021 revealing an occluded dominant RCA with occluded vein graft to the right, left-to-right collaterals and otherwise patent grafts.  He did have elevated filling pressures with a right atrial pressure of 17 and elevated V wave.  He was admitted by advanced heart failure service and consulted on by Dr. Gala Romney.  He was placed on mexiletine for his high PVC burden and his heart failure medications were optimized.  He diuresed significantly and he is now 18 pounds lower than he was when I first saw him and feels clinically improved.  He was seen by Dr. Gala Romney in the advanced heart failure clinic and Dr. Elberta Fortis.  He was placed on mexiletine which she did not tolerate and amiodarone.  He ultimately underwent PVC ablation which successfully addressed his high PVC burden.  He also had an ICD placed by Dr. Elberta Fortis 09/08/2022.  He is still gainfully employed, does not exercise but is asymptomatic.     Current Meds  Medication Sig   ARIPiprazole (ABILIFY) 5 MG tablet Take 1 tablet (5 mg total) by mouth daily.   aspirin 81 MG tablet Take 81 mg by mouth daily.   Bevacizumab (AVASTIN IV) Inject 1 Dose into the eye See admin instructions. Every six to eight weeks in left eye   carvedilol (  COREG) 3.125 MG tablet Take 1 tablet (3.125 mg total) by mouth 2 (two) times daily.   ciprofloxacin (CILOXAN) 0.3 % ophthalmic solution Place 1 drop into the left eye 4 (four) times daily. After Eye injection every 6-8 weeks for two days   dapagliflozin propanediol (FARXIGA) 10 MG TABS tablet TAKE 1 TABLET BY MOUTH DAILY BEFORE BREAKFAST   escitalopram (LEXAPRO) 20 MG tablet Take 1 tablet (20 mg total) by mouth daily.   levothyroxine (SYNTHROID) 50 MCG tablet Take 1 tablet (50 mcg total) by mouth daily.    losartan (COZAAR) 25 MG tablet Take 1 tablet by mouth daily.   metFORMIN (GLUCOPHAGE-XR) 500 MG 24 hr tablet Take 1 tablet (500 mg total) by mouth daily with breakfast.   rosuvastatin (CRESTOR) 20 MG tablet Take 1 tablet (20 mg total) by mouth daily.   spironolactone (ALDACTONE) 25 MG tablet TAKE 1/2 TABLET(12.5 MG) BY MOUTH AT BEDTIME   ursodiol (ACTIGALL) 500 MG tablet Take 500 mg by mouth 2 (two) times daily.     No Known Allergies  Social History   Socioeconomic History   Marital status: Married    Spouse name: Not on file   Number of children: Not on file   Years of education: Not on file   Highest education level: Not on file  Occupational History   Not on file  Tobacco Use   Smoking status: Never   Smokeless tobacco: Former   Tobacco comments:    a long time ago  Vaping Use   Vaping status: Never Used  Substance and Sexual Activity   Alcohol use: Yes    Alcohol/week: 3.0 standard drinks of alcohol    Types: 3 Cans of beer per week    Comment: sometimes 3 beers daily   Drug use: No   Sexual activity: Yes  Other Topics Concern   Not on file  Social History Narrative   Not on file   Social Drivers of Health   Financial Resource Strain: Not on file  Food Insecurity: Not on file  Transportation Needs: Not on file  Physical Activity: Not on file  Stress: Not on file  Social Connections: Not on file  Intimate Partner Violence: Not on file     Review of Systems: General: negative for chills, fever, night sweats or weight changes.  Cardiovascular: negative for chest pain, dyspnea on exertion, edema, orthopnea, palpitations, paroxysmal nocturnal dyspnea or shortness of breath Dermatological: negative for rash Respiratory: negative for cough or wheezing Urologic: negative for hematuria Abdominal: negative for nausea, vomiting, diarrhea, bright red blood per rectum, melena, or hematemesis Neurologic: negative for visual changes, syncope, or dizziness All other  systems reviewed and are otherwise negative except as noted above.    Blood pressure 104/64, pulse 67, height 5' 7.5" (1.715 m), weight 160 lb 6.4 oz (72.8 kg), SpO2 97%.  General appearance: alert and no distress Neck: no adenopathy, no carotid bruit, no JVD, supple, symmetrical, trachea midline, and thyroid not enlarged, symmetric, no tenderness/mass/nodules Lungs: clear to auscultation bilaterally Heart: regular rate and rhythm, S1, S2 normal, no murmur, click, rub or gallop Extremities: extremities normal, atraumatic, no cyanosis or edema Pulses: 2+ and symmetric Skin: Skin color, texture, turgor normal. No rashes or lesions Neurologic: Grossly normal  EKG not performed today      ASSESSMENT AND PLAN:   Mixed hyperlipidemia History of dyslipidemia on statin therapy with lipid profile performed/2/24 revealing total cholesterol of 158, LDL 63 and HDL 64.  PVC's (premature ventricular contractions)  History of frequent PVCs with a burden of 2.4%.  He did have PVC ablation by Dr. Elberta Fortis.  Prior to that he had been on mexiletine which she did not tolerate amiodarone.  S/P CABG (coronary artery bypass graft) History of CAD status post anterior STEMI 08/28/2009 treated with LAD stenting.  He ultimately went CABG 02/27/2014 with a LIMA to his LAD, vein to ramus branch, obtuse marginal branch and RCA.  His most recent cath which I performed 08/03/2021 was notable for an occluded dominant RCA with an occluded vein graft to the right, left-to-right collaterals and otherwise patent grafts.  He is completely asymptomatic.  LV dysfunction History of LV dysfunction thought to be ischemic.  He is on GDMT.  He had an ICD placed for primary prevention by Dr. Elberta Fortis 09/08/2022 with no discharges.  He is on GDMT with recent echo performed 05/27/2023 that revealed EF of 40 to 45% with global hypokinesia.     Runell Gess MD FACP,FACC,FAHA, [facility] 08/10/2023 4:08 PM

## 2023-08-10 NOTE — Patient Instructions (Signed)
    Follow-Up: At [facility], you and your health needs are our priority.  As part of our continuing mission to provide you with exceptional heart care, we have created designated Provider Care Teams.  These Care Teams include your primary Cardiologist (physician) and Advanced Practice Providers (APPs -  Physician Assistants and Nurse Practitioners) who all work together to provide you with the care you need, when you need it.    Your next appointment:   12 month(s)  Provider:   Nanetta Batty MD

## 2023-08-12 ENCOUNTER — Encounter (HOSPITAL_COMMUNITY): Payer: Self-pay

## 2023-08-16 ENCOUNTER — Telehealth (HOSPITAL_COMMUNITY): Payer: Self-pay | Admitting: *Deleted

## 2023-08-16 NOTE — Telephone Encounter (Signed)
Reaching out to patient to offer assistance regarding upcoming cardiac imaging study; pt verbalizes understanding of appt date/time, parking situation and where to check in, pre-test NPO status and verified current allergies; name and call back number provided for further questions should they arise  Larey Brick RN Navigator Cardiac Imaging [facility] [phone] office [phone] cell  Patient verbalized understanding of his diet prep.

## 2023-08-18 ENCOUNTER — Encounter (HOSPITAL_COMMUNITY): Payer: Self-pay

## 2023-08-18 ENCOUNTER — Encounter (HOSPITAL_COMMUNITY)
Admission: RE | Admit: 2023-08-18 | Discharge: 2023-08-18 | Disposition: A | Discharge status: Home / Self Care | Payer: BC Managed Care – PPO | Source: Ambulatory Visit | Attending: Internal Medicine | Admitting: Internal Medicine

## 2023-08-18 DIAGNOSIS — I5022 Chronic systolic (congestive) heart failure: Secondary | ICD-10-CM | POA: Insufficient documentation

## 2023-08-19 ENCOUNTER — Other Ambulatory Visit: Payer: Self-pay | Admitting: Cardiovascular Disease

## 2023-08-23 ENCOUNTER — Ambulatory Visit: Payer: BC Managed Care – PPO | Admitting: Psychiatry

## 2023-08-23 DIAGNOSIS — F411 Generalized anxiety disorder: Secondary | ICD-10-CM | POA: Diagnosis not present

## 2023-08-23 NOTE — Progress Notes (Signed)
[facility] Counselor/Therapist Progress Note  Patient ID: Shane [name], MRN: [id number],    Date: 08/23/2023  Time Spent: 50 minutes   Treatment Type: Individual Therapy  Reported Symptoms:  anxiety, family concerns   Mental Status Exam:  Appearance:   Casual and Neat     Behavior:  Appropriate, Sharing, and Motivated  Motor:  Normal  Speech/Language:   Clear and Coherent  Affect:  Appropriate  Mood:  anxious  Thought process:  goal directed  Thought content:    WNL  Sensory/Perceptual disturbances:    WNL  Orientation:  oriented to person, place, time/date, situation, day of week, month of year, year, and stated date of Feb. 11, 2025  Attention:  Good  Concentration:  Good and Fair  Memory:  Some short term memory issues "which is normal"  Fund of knowledge:   Good  Insight:    Good  Judgment:   Good  Impulse Control:  Good   Risk Assessment: Danger to Self:  No Self-injurious Behavior: No Danger to Others: No Duty to Warn:no Physical Aggression / Violence:No  Access to Firearms a concern: No  Gang Involvement:No   Subjective: Patient in session today with continued work on his family concerns and anxieties, reporting anxiety is his stronger symptom. Does feel in several situations that he shared today that he is improving in his management  of his anxiety. "I use my brain more and I make sure I calm it and stop it when needed."  Does struggle with "new things and it frustrates me to not be able to manage it."  Offered several examples of this and how it is played out for him, including how he was able to better let go of frustration and move forward.  Did get more information on his adult son that he had been concerned about and feels better about his situation at this point.  No longer helping him financially as that is not needed.  Discussed retirement-related issues today and how that can impact his mood and anxiety level.  States he is not feeling anxious  about it at this point and tries to frame his thoughts about eventual retirement in a more positive way versus starting up more anxiety.  Acknowledges that he is blessed that his "situation is what it is financially" and knows that he will be able to retire comfortably without the extra worries that retirement could bring on.  Showing more improvement which is very encouraging for patient.  Reviewed his working with triggers to his anxiety, encouraged him to continue using his CPAP machine which has been very helpful, and feel good about the goal related progress that he is experiencing.  Interventions: Cognitive Behavioral Therapy and Ego-Supportive  Long Term Goal: Enhance the ability to handle effectively his depression and the full variety of life's anxieties.  Short Term Goal:  Verbalize an understanding of how thoughts, physical feelings, and behavioral actions contribute to anxiety and anger and its treatment. Strategy:  Patient will work to recognize and interrupt triggers to his anxious or depressive thoughts  so as to replace with positive, more reality-based thoughts, which lead to more positive actions/behaviors.  Diagnosis:   ICD-10-CM   1. Generalized anxiety disorder  F41.1      Plan: Patient today working further on his anxiety and some family relationship issues.  (Not all details included in this note due to patient privacy needs especially regarding the family relationship issues.) Anxiety management is improving as  patient shared some examples of this improvement. Reminded and encouraged patient to be practicing the positive and self affirming behaviors as noted in sessions including: Staying focused in the present and on the things he can control versus what he cannot control, use of deep breathing exercises as noted in session to help with his anxiety, practicing more active listening skills with his wife and others in the family, consistently practicing positive self talk,  remain on medication as prescribed, stay in contact with supportive people, participate as he is able and outside activities that he enjoys and recognize the strength he shows working with goal-directed behaviors to move in a direction that supports his overall emotional health and wellbeing.  Patient has shown progress and needs to continue working with his goal-directed behaviors that support his moving in a more positive and more healthy direction.  Goal review and progress/challenges noted with patient.  Next appointment within 6 weeks.   Mathis Fare, LCSW

## 2023-09-15 ENCOUNTER — Encounter (INDEPENDENT_AMBULATORY_CARE_PROVIDER_SITE_OTHER): Payer: BC Managed Care – PPO | Admitting: Ophthalmology

## 2023-09-15 DIAGNOSIS — H318 Other specified disorders of choroid: Secondary | ICD-10-CM

## 2023-09-15 DIAGNOSIS — H348322 Tributary (branch) retinal vein occlusion, left eye, stable: Secondary | ICD-10-CM | POA: Diagnosis not present

## 2023-09-15 DIAGNOSIS — H32 Chorioretinal disorders in diseases classified elsewhere: Secondary | ICD-10-CM

## 2023-09-15 DIAGNOSIS — H43813 Vitreous degeneration, bilateral: Secondary | ICD-10-CM

## 2023-09-15 DIAGNOSIS — B399 Histoplasmosis, unspecified: Secondary | ICD-10-CM

## 2023-09-15 DIAGNOSIS — H2513 Age-related nuclear cataract, bilateral: Secondary | ICD-10-CM

## 2023-09-16 ENCOUNTER — Ambulatory Visit (INDEPENDENT_AMBULATORY_CARE_PROVIDER_SITE_OTHER): Payer: BC Managed Care – PPO

## 2023-09-16 DIAGNOSIS — I255 Ischemic cardiomyopathy: Secondary | ICD-10-CM

## 2023-09-18 LAB — CUP PACEART REMOTE DEVICE CHECK
Battery Remaining Longevity: 158 mo
Battery Voltage: 3.01 V
Brady Statistic RV Percent Paced: 0.02 %
Date Time Interrogation Session: 20250307081312
HighPow Impedance: 57 Ohm
Implantable Lead Connection Status: 753985
Implantable Lead Implant Date: 20240228
Implantable Lead Location: 753860
Implantable Pulse Generator Implant Date: 20240228
Lead Channel Impedance Value: 285 Ohm
Lead Channel Impedance Value: 323 Ohm
Lead Channel Pacing Threshold Amplitude: 0.75 V
Lead Channel Pacing Threshold Pulse Width: 0.4 ms
Lead Channel Sensing Intrinsic Amplitude: 10.6 mV
Lead Channel Setting Pacing Amplitude: 2 V
Lead Channel Setting Pacing Pulse Width: 0.4 ms
Lead Channel Setting Sensing Sensitivity: 0.3 mV
Zone Setting Status: 755011

## 2023-09-20 ENCOUNTER — Other Ambulatory Visit (HOSPITAL_COMMUNITY): Payer: BC Managed Care – PPO

## 2023-09-30 ENCOUNTER — Other Ambulatory Visit (HOSPITAL_COMMUNITY): Payer: Self-pay | Admitting: Family Medicine

## 2023-10-03 ENCOUNTER — Telehealth (HOSPITAL_COMMUNITY): Payer: Self-pay | Admitting: *Deleted

## 2023-10-03 NOTE — Telephone Encounter (Signed)
 Reaching out to patient to offer assistance regarding upcoming cardiac imaging study; pt verbalizes understanding of appt date/time, parking situation and where to check in, pre-test NPO status; name and call back number provided for further questions should they arise  Larey Brick RN Navigator Cardiac Imaging [facility] [phone] office [phone] cell  Patient verbalized understanding of diet instructions.

## 2023-10-05 ENCOUNTER — Encounter (HOSPITAL_COMMUNITY)
Admission: RE | Admit: 2023-10-05 | Discharge: 2023-10-05 | Disposition: A | Discharge status: Home / Self Care | Payer: BC Managed Care – PPO | Source: Ambulatory Visit | Attending: Internal Medicine | Admitting: Internal Medicine

## 2023-10-05 DIAGNOSIS — I5022 Chronic systolic (congestive) heart failure: Secondary | ICD-10-CM

## 2023-10-05 LAB — NM PET CT MYOCARDIAL SARCOIDOSIS
LV dias vol: 107 mL (ref 62–150)
LV sys vol: 54 mL
Nuc Stress EF: 50 %
Rest Nuclear Isotope Dose: 18.8 mCi

## 2023-10-05 MED ORDER — RUBIDIUM RB82 GENERATOR (RUBYFILL)
18.7600 | PACK | Freq: Once | INTRAVENOUS | Status: AC
  Administered 2023-10-05: 18.76 via INTRAVENOUS

## 2023-10-05 MED ORDER — FLUDEOXYGLUCOSE F - 18 (FDG) INJECTION
8.9500 | Freq: Once | INTRAVENOUS | Status: AC
  Administered 2023-10-05: 8.95 via INTRAVENOUS

## 2023-10-13 ENCOUNTER — Ambulatory Visit (INDEPENDENT_AMBULATORY_CARE_PROVIDER_SITE_OTHER): Payer: BC Managed Care – PPO | Admitting: Family Medicine

## 2023-10-13 ENCOUNTER — Encounter: Payer: Self-pay | Admitting: Family Medicine

## 2023-10-13 VITALS — BP 119/81 | HR 65 | Temp 98.8°F | Ht 67.5 in | Wt 159.0 lb

## 2023-10-13 DIAGNOSIS — E118 Type 2 diabetes mellitus with unspecified complications: Secondary | ICD-10-CM | POA: Diagnosis not present

## 2023-10-13 DIAGNOSIS — G4733 Obstructive sleep apnea (adult) (pediatric): Secondary | ICD-10-CM

## 2023-10-13 DIAGNOSIS — E039 Hypothyroidism, unspecified: Secondary | ICD-10-CM

## 2023-10-13 DIAGNOSIS — E782 Mixed hyperlipidemia: Secondary | ICD-10-CM | POA: Diagnosis not present

## 2023-10-13 DIAGNOSIS — F339 Major depressive disorder, recurrent, unspecified: Secondary | ICD-10-CM

## 2023-10-13 DIAGNOSIS — Z8571 Personal history of Hodgkin lymphoma: Secondary | ICD-10-CM

## 2023-10-13 DIAGNOSIS — Z0000 Encounter for general adult medical examination without abnormal findings: Secondary | ICD-10-CM | POA: Diagnosis not present

## 2023-10-13 DIAGNOSIS — I152 Hypertension secondary to endocrine disorders: Secondary | ICD-10-CM

## 2023-10-13 DIAGNOSIS — E1159 Type 2 diabetes mellitus with other circulatory complications: Secondary | ICD-10-CM | POA: Diagnosis not present

## 2023-10-13 DIAGNOSIS — F411 Generalized anxiety disorder: Secondary | ICD-10-CM

## 2023-10-13 DIAGNOSIS — E1169 Type 2 diabetes mellitus with other specified complication: Secondary | ICD-10-CM | POA: Insufficient documentation

## 2023-10-13 DIAGNOSIS — Z0001 Encounter for general adult medical examination with abnormal findings: Secondary | ICD-10-CM

## 2023-10-13 DIAGNOSIS — I2510 Atherosclerotic heart disease of native coronary artery without angina pectoris: Secondary | ICD-10-CM

## 2023-10-13 DIAGNOSIS — I5020 Unspecified systolic (congestive) heart failure: Secondary | ICD-10-CM

## 2023-10-13 LAB — LIPID PANEL
Cholesterol: 135 mg/dL (ref 0–200)
HDL: 59.1 mg/dL (ref >39.00)
LDL Cholesterol: 57 mg/dL (ref 0–99)
NonHDL: 75.48
Total CHOL/HDL Ratio: 2
Triglycerides: 94 mg/dL (ref 0.0–149.0)
VLDL: 18.8 mg/dL (ref 0.0–40.0)

## 2023-10-13 LAB — COMPREHENSIVE METABOLIC PANEL WITH GFR
ALT: 20 U/L (ref 0–53)
AST: 22 U/L (ref 0–37)
Albumin: 4.6 g/dL (ref 3.5–5.2)
Alkaline Phosphatase: 74 U/L (ref 39–117)
BUN: 18 mg/dL (ref 6–23)
CO2: 27 meq/L (ref 19–32)
Calcium: 9.6 mg/dL (ref 8.4–10.5)
Chloride: 104 meq/L (ref 96–112)
Creatinine, Ser: 1.14 mg/dL (ref 0.40–1.50)
GFR: 70.71 mL/min (ref >60.00)
Glucose, Bld: 91 mg/dL (ref 70–99)
Potassium: 4.9 meq/L (ref 3.5–5.1)
Sodium: 138 meq/L (ref 135–145)
Total Bilirubin: 0.7 mg/dL (ref 0.2–1.2)
Total Protein: 6.8 g/dL (ref 6.0–8.3)

## 2023-10-13 LAB — HEMOGLOBIN A1C: Hgb A1c MFr Bld: 6.1 % (ref 4.6–6.5)

## 2023-10-13 LAB — CBC WITH DIFFERENTIAL/PLATELET
Basophils Absolute: 0 10*3/uL (ref 0.0–0.1)
Basophils Relative: 0.7 % (ref 0.0–3.0)
Eosinophils Absolute: 0.1 10*3/uL (ref 0.0–0.7)
Eosinophils Relative: 2.2 % (ref 0.0–5.0)
HCT: 42.5 % (ref 39.0–52.0)
Hemoglobin: 14.2 g/dL (ref 13.0–17.0)
Lymphocytes Relative: 24.7 % (ref 12.0–46.0)
Lymphs Abs: 1.5 10*3/uL (ref 0.7–4.0)
MCHC: 33.3 g/dL (ref 30.0–36.0)
MCV: 97.8 fl (ref 78.0–100.0)
Monocytes Absolute: 0.5 10*3/uL (ref 0.1–1.0)
Monocytes Relative: 8.7 % (ref 3.0–12.0)
Neutro Abs: 4 10*3/uL (ref 1.4–7.7)
Neutrophils Relative %: 63.7 % (ref 43.0–77.0)
Platelets: 273 10*3/uL (ref 150.0–400.0)
RBC: 4.35 Mil/uL (ref 4.22–5.81)
RDW: 13.5 % (ref 11.5–15.5)
WBC: 6.3 10*3/uL (ref 4.0–10.5)

## 2023-10-13 LAB — MICROALBUMIN / CREATININE URINE RATIO
Creatinine,U: 47.1 mg/dL
Microalb Creat Ratio: UNDETERMINED mg/g (ref 0.0–30.0)
Microalb, Ur: <0.7 mg/dL

## 2023-10-13 LAB — TSH: TSH: 0.95 u[IU]/mL (ref 0.35–5.50)

## 2023-10-13 NOTE — Progress Notes (Signed)
 Subjective   Chief Complaint  Patient presents with   Annual Exam    Pt here for Annual Exam and is not currently fasting    Diabetes   Hypertension    HPI: Shane [name] is a 58 y.o. male who presents to [facility] [place] today for a Male Wellness Visit. He also has the concerns and/or needs as listed above in the chief complaint. These will be addressed in addition to the Health Maintenance Visit.   Wellness Visit: annual visit with health maintenance review and exam   HM: screens are all current. Reports feels well. Tolerating all meds. No concerns. Eye exam current in December.   Body mass index is 24.54 kg/m. Wt Readings from Last 3 Encounters:  10/13/23 159 lb (72.1 kg)  08/10/23 160 lb 6.4 oz (72.8 kg)  05/10/23 160 lb 9.6 oz (72.8 kg)     Chronic disease management visit and/or acute problem visit: Heart failure: reviewed cards notes and labs. Stable on heart meds. Bp is tolerating meds. No cp or palpitations or sob DM with low dose metformin and farxiga. Says can improve diet some but weight is stable and no sxs of hyperglycemia. No neuropathy or retinopathy. On appropriate meds. HLD on statin and due for recheck. Has been well controlled.  CAD: stable Low thyroid on levotx daily and feeling well.  GAD and depression on mood meds per psych with reported good control.  Intermittently using CPAP for osa. Does find it helpful.  Assessment  1. Encounter for well adult exam with abnormal findings   2. Controlled type 2 diabetes mellitus with complication, without long-term current use of insulin (HCC)   3. Hypertension associated with type 2 diabetes mellitus (HCC)   4. Combined hyperlipidemia associated with type 2 diabetes mellitus (HCC)   5. HFrEF (heart failure with reduced ejection fraction) (HCC)   6. Coronary artery disease involving native coronary artery of native heart without angina pectoris   7. Acquired hypothyroidism   8.  GAD (generalized anxiety disorder)   9. Major depression, recurrent, chronic (HCC)   10. OSA on CPAP   11. History of Hodgkin's lymphoma      Plan  Male Wellness Visit: Age appropriate Health Maintenance and Prevention measures were discussed with patient. Included topics are cancer screening recommendations, ways to keep healthy (see AVS) including dietary and exercise recommendations, regular eye and dental care, use of seat belts, and avoidance of moderate alcohol use and tobacco use. Screens are current BMI: discussed patient's BMI and encouraged positive lifestyle modifications to help get to or maintain a target BMI. HM needs and immunizations were addressed and ordered. See below for orders. See HM and immunization section for updates. Routine labs and screening tests ordered including cmp, cbc and lipids where appropriate. Discussed recommendations regarding Vit D and calcium supplementation (see AVS)  Chronic disease f/u and/or acute problem visit: (deemed necessary to be done in addition to the wellness visit): Heart failure is stable on meds per cards. Check renal and lytes Recheck TSH on levotx daily. Clinically euthyroid DM has been controlled. Will reassess with A1c. Drop metformin if able. Continue farxiga for heart failure and diabetes.  BP is excellent Mood is controlled.  Recheck lipids and lfts on statin.  Continue asa  Follow up: 6 mo for recheck Commons side effects, risks, benefits, and alternatives for medications and treatment plan prescribed today were discussed, and the patient expressed understanding of the given instructions.  Patient is instructed to call or message via MyChart if he/she has any questions or concerns regarding our treatment plan. No barriers to understanding were identified. We discussed Red Flag symptoms and signs in detail. Patient expressed understanding regarding what to do in case of urgent or emergency type symptoms.  Medication list  was reconciled, printed and provided to the patient in AVS. Patient instructions and summary information was reviewed with the patient as documented in the AVS. This note was prepared with assistance of Dragon voice recognition software. Occasional wrong-word or sound-a-like substitutions may have occurred due to the inherent limitations of voice recognition software  Orders Placed This Encounter  Procedures   CBC with Differential/Platelet   Comprehensive metabolic panel with GFR   Lipid panel   Hemoglobin A1c   TSH   Microalbumin / creatinine urine ratio   No orders of the defined types were placed in this encounter.    Patient Active Problem List   Diagnosis Date Noted   Controlled type 2 diabetes mellitus with complication, without long-term current use of insulin (HCC) 04/16/2022   Hypertension associated with type 2 diabetes mellitus (HCC) 01/14/2022   Primary biliary cholangitis (HCC) 10/08/2021   HFrEF (heart failure with reduced ejection fraction) (HCC) 08/18/2021   CAD (coronary artery disease) 12/03/2019   OSA on CPAP 09/07/2018   GAD (generalized anxiety disorder) 05/25/2018   Major depression, recurrent, chronic (HCC) 05/25/2018   S/P CABG (coronary artery bypass graft) 11/16/2017   Mixed hyperlipidemia 05/24/2014   Alcohol use disorder, mild, abuse 03/25/2019   Acquired hypothyroidism 11/16/2017   Macular degeneration    History of Hodgkin's lymphoma 07/13/1995   PVC's (premature ventricular contractions) 09/11/2014   Combined hyperlipidemia associated with type 2 diabetes mellitus (HCC) 10/13/2023   Gynecomastia 04/16/2022   PVC (premature ventricular contraction) 12/23/2021   Mediastinal adenopathy 10/20/2021   LV dysfunction 07/24/2021   Health Maintenance  Topic Date Due   Pneumococcal Vaccine 16-39 Years old (1 of 2 - PCV) Never done   Diabetic kidney evaluation - Urine ACR  10/12/2023   HEMOGLOBIN A1C  10/11/2023   COVID-19 Vaccine (4 - 2024-25 season)  10/29/2023 (Originally 03/13/2023)   INFLUENZA VACCINE  02/10/2024   FOOT EXAM  04/11/2024   Diabetic kidney evaluation - eGFR measurement  05/09/2024   OPHTHALMOLOGY EXAM  07/07/2024   Colonoscopy  11/04/2025   DTaP/Tdap/Td (2 - Td or Tdap) 11/17/2027   Hepatitis C Screening  Completed   HIV Screening  Completed   Zoster Vaccines- Shingrix  Completed   HPV VACCINES  Aged Out   Immunization History  Administered Date(s) Administered   Hepatitis A, Adult 10/08/2021, 04/13/2022   Hepatitis B, ADULT 11/12/2021   Hepb-cpg 10/08/2021   Influenza, Seasonal, Injecte, Preservative Fre 04/12/2023   Influenza,inj,Quad PF,6+ Mos 04/03/2018, 04/03/2019, 04/10/2021, 04/16/2022   Influenza-Unspecified 03/26/2020   PFIZER Comirnaty(Gray Top)Covid-19 Tri-Sucrose Vaccine 10/30/2020   PFIZER(Purple Top)SARS-COV-2 Vaccination 10/22/2019, 11/12/2019   Tdap 11/16/2017   Zoster Recombinant(Shingrix) 10/09/2020, 04/10/2021   We updated and reviewed the patient's past history in detail and it is documented below. Allergies: Patient has no known allergies. Past Medical History  has a past medical history of CAD (coronary artery disease), s/p stent 2011 and CABG 2015 [facility]), Hodgkin disease [facility]) (1997), Hyperlipidemia, Macular degeneration, and PVC's (premature ventricular contractions). Past Surgical History Patient  has a past surgical history that includes Coronary artery bypass graft (02/2014); Cardiac catheterization (08/2009); RIGHT/LEFT HEART CATH AND CORONARY ANGIOGRAPHY (N/A, 08/03/2021); PVC ABLATION (  N/A, 12/23/2021); and ICD IMPLANT (N/A, 09/08/2022). Social History Patient  reports that he has never smoked. He has quit using smokeless tobacco. He reports current alcohol use of about 3.0 standard drinks of alcohol per week. He reports that he does not use drugs. Family History family history includes COPD in his father and mother; Cancer in his maternal grandmother and mother; Heart  Problems in his brother and brother; Heart attack in his father; Heart disease in his father; Stroke in his mother. Review of Systems: Constitutional: negative for fever or malaise Ophthalmic: negative for photophobia, double vision or loss of vision Cardiovascular: negative for chest pain, dyspnea on exertion, or new LE swelling Respiratory: negative for SOB or persistent cough Gastrointestinal: negative for abdominal pain, change in bowel habits or melena Genitourinary: negative for dysuria or gross hematuria Musculoskeletal: negative for new gait disturbance or muscular weakness Integumentary: negative for new or persistent rashes Neurological: negative for TIA or stroke symptoms Psychiatric: negative for SI or delusions Allergic/Immunologic: negative for hives  Patient Care Team    Relationship Specialty Notifications Start End  Willow Ora, MD PCP - General Family Medicine  04/19/23   Regan Lemming, MD PCP - Electrophysiology Cardiology  04/19/23   Runell Gess, MD Consulting Physician Cardiology  11/16/17   Sherrie George, MD Consulting Physician Ophthalmology  11/16/17   Josph Macho, MD Consulting Physician Oncology  11/16/17   Kalman Shan, MD Consulting Physician Pulmonary Disease  04/16/22   Andrena Mews, MD Consulting Physician Gastroenterology  04/16/22    Objective  Vitals: BP 119/81   Pulse 65   Temp 98.8 F (37.1 C)   Ht 5' 7.5" (1.715 m)   Wt 159 lb (72.1 kg)   SpO2 97%   BMI 24.54 kg/m  General:  Well developed, well nourished, no acute distress  Psych:  Alert and orientedx3,normal mood and affect HEENT:  Normocephalic, atraumatic, non-icteric sclera,  oropharynx is clear without mass or exudate, supple neck without adenopathy, or thyromegaly Cardiovascular:  Normal S1, S2, RRR without gallop, rub or murmur,  Respiratory:  Good breath sounds bilaterally, CTAB with normal respiratory effort Gastrointestinal: normal bowel sounds, soft,  non-tender, no noted masses. No HSM MSK: Joints are without erythema or swelling.  Skin:  Warm, no rashes Neurologic:    Mental status is normal.   Stable gait. No tremor

## 2023-10-13 NOTE — Patient Instructions (Signed)
 Please return in 6 months to recheck diabetes and blood pressure   I will release your lab results to you on your MyChart account with further instructions. You may see the results before I do, but when I review them I will send you a message with my report or have my assistant call you if things need to be discussed. Please reply to my message with any questions. Thank you!   If you have any questions or concerns, please don't hesitate to send me a message via MyChart or call the office at [phone]. Thank you for visiting with [place] today! It's our pleasure caring for you.   VISIT SUMMARY:  Today, you came in for your annual physical exam. You reported no current issues or concerns and are taking all your medications without any complications or side effects. Your routine health maintenance is up to date, including your colonoscopy and eye exam. Your diabetes and blood pressure are well-controlled, and you are using your CPAP machine regularly, which helps reduce the burden on your heart at night. You recently traveled to [place] and plan to return in May.  YOUR PLAN:  -TYPE 2 DIABETES MELLITUS: Your diabetes is well-controlled with no symptoms of high blood sugar. We discussed the possibility of stopping metformin depending on your lab results. We will order lab work to assess your diabetes control and consider discontinuing metformin if the results are good.  -HYPOTHYROIDISM: Hypothyroidism means your thyroid gland is underactive. We will reassess your thyroid function with lab work and provide a one-year refill of your thyroid medication if the results are stable.  -HYPERTENSION: Your high blood pressure is well-controlled, and you are not experiencing any complications or side effects from your medications.  -OBSTRUCTIVE SLEEP APNEA: Obstructive sleep apnea is a condition where your breathing stops and starts during sleep. I recommend using your CPAP machine regularly, which helps reduce the  strain on your heart and improves your sleep.  -GENERAL HEALTH MAINTENANCE: Your routine health maintenance is up to date, including your colonoscopy and eye exam. We will order lab work to assess your cholesterol levels.  INSTRUCTIONS:  Please complete the lab work as ordered to assess your diabetes control, thyroid function, and cholesterol levels. Follow up with [place] once the lab results are available to discuss any potential changes to your medications.

## 2023-10-15 ENCOUNTER — Encounter: Payer: Self-pay | Admitting: Family Medicine

## 2023-10-15 NOTE — Progress Notes (Signed)
 See mychart note Dear Shane [name], Your lab results look great overall! Your sugar test remains in the early prediabetic range. Eat healthy. No other changes are needed at this time. Sincerely, Dr. Mardelle Matte

## 2023-10-16 IMAGING — MR MR CARD MORPHOLOGY WO/W CM
45 of 48 series · 45 of 48 positions shown · IV contrast (gadavist)
Comparison: none

CLINICAL DATA: Clinical question of Cardiomyopathy
Study assumes BSA of 1.83 m2.

EXAM:
CARDIAC MRI
TECHNIQUE: The patient was scanned on a 1.5 Tesla GE magnet. A dedicated
cardiac coil was used. Functional imaging was done using Fiesta
sequences. [DATE], and 4 chamber views were done to assess for RWMA's.
Modified Zander rule using a short axis stack was used to
calculate an ejection fraction on a dedicated work station using
Circle software. The patient received 12 cc of Gadavist. After 10
minutes inversion recovery sequences were used to assess for
infiltration and scar tissue.
CONTRAST:  12 cc  of Gadavist

[Series 4: t2_haste_db_tra_bh · axial · 8.0mm · 1.41mm/px · 1 of 16 slices shown]
[im 1/16]
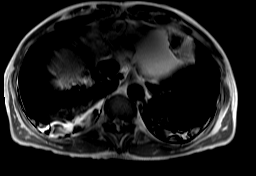

[Series 8: bSSFP · oblique · 8.0mm · 1.61mm/px · 1 of 25 slices shown (1 of 22)]
[im 1/25]
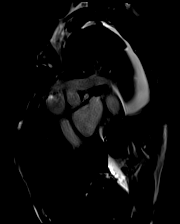

[Series 9: bSSFP · oblique · 8.0mm · 1.61mm/px · 1 of 25 slices shown (2 of 22)]
[im 1/25]
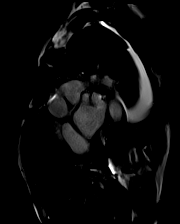

[Series 10: bSSFP · oblique · 8.0mm · 1.61mm/px · 1 of 25 slices shown (3 of 22)]
[im 1/25]
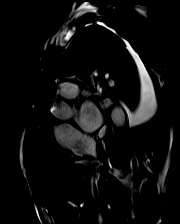

[Series 11: bSSFP · oblique · 8.0mm · 1.61mm/px · 1 of 25 slices shown (4 of 22)]
[im 1/25]
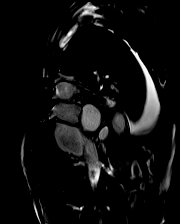

[Series 12: bSSFP · oblique · 8.0mm · 1.61mm/px · 1 of 25 slices shown (5 of 22)]
[im 1/25]
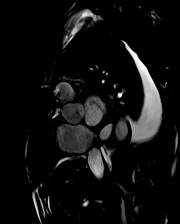

[Series 13: bSSFP · oblique · 8.0mm · 1.61mm/px · 1 of 25 slices shown (6 of 22)]
[im 1/25]
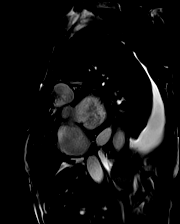

[Series 14: bSSFP · oblique · 8.0mm · 1.61mm/px · 1 of 25 slices shown (7 of 22)]
[im 1/25]
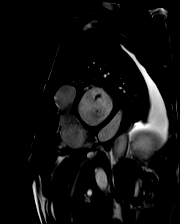

[Series 15: bSSFP · oblique · 8.0mm · 1.61mm/px · 1 of 25 slices shown (8 of 22)]
[im 1/25]
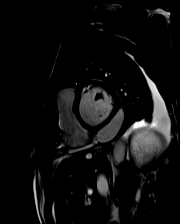

[Series 16: bSSFP · oblique · 8.0mm · 1.61mm/px · 1 of 25 slices shown (9 of 22)]
[im 1/25]
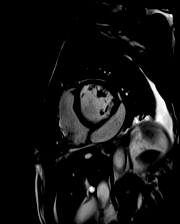

[Series 17: bSSFP · oblique · 8.0mm · 1.61mm/px · 1 of 25 slices shown (10 of 22)]
[im 1/25]
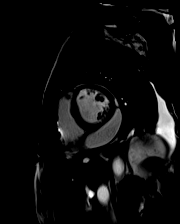

[Series 18: bSSFP · oblique · 8.0mm · 1.61mm/px · 1 of 25 slices shown (11 of 22)]
[im 1/25]
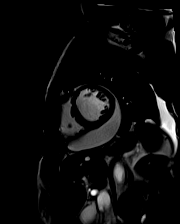

[Series 19: bSSFP · oblique · 8.0mm · 1.61mm/px · 1 of 25 slices shown (12 of 22)]
[im 1/25]
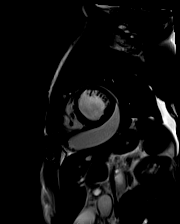

[Series 20: bSSFP · oblique · 8.0mm · 1.61mm/px · 1 of 25 slices shown (13 of 22)]
[im 1/25]
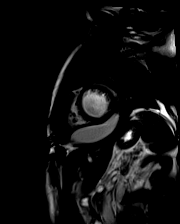

[Series 21: bSSFP · oblique · 8.0mm · 1.61mm/px · 1 of 25 slices shown (14 of 22)]
[im 1/25]
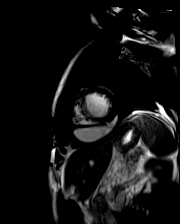

[Series 22: bSSFP · oblique · 8.0mm · 1.61mm/px · 1 of 25 slices shown (15 of 22)]
[im 1/25]
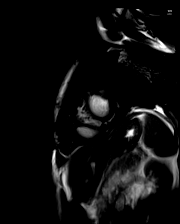

[Series 23: bSSFP · oblique · 8.0mm · 1.61mm/px · 1 of 25 slices shown (16 of 22)]
[im 1/25]
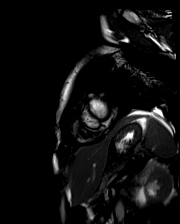

[Series 24: bSSFP · oblique · 8.0mm · 1.61mm/px · 1 of 25 slices shown (17 of 22)]
[im 1/25]
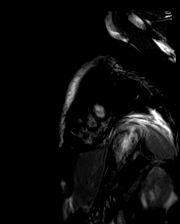

[Series 25: bSSFP · oblique · 8.0mm · 1.61mm/px · 1 of 25 slices shown (18 of 22)]
[im 1/25]
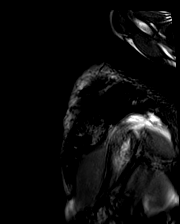

[Series 26: bSSFP · oblique · 8.0mm · 1.61mm/px · 1 of 25 slices shown (19 of 22)]
[im 1/25]
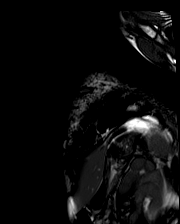

[Series 27: (id)_long_t1 · oblique · 8.0mm · 1.56mm/px · 1 of 24 slices shown]
[im 1/24]
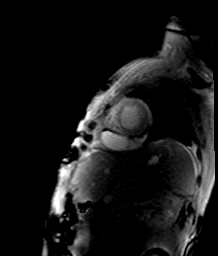

[Series 28: (id)_long_t1_moco · oblique · 8.0mm · 1.56mm/px · 1 of 24 slices shown]
[im 1/24]
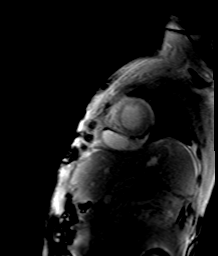

[Series 29: (id)_long_t1_moco_t1 · oblique · 8.0mm · 1.56mm/px · 1 of 3 slices shown (1 of 2)]
[im 1/3]
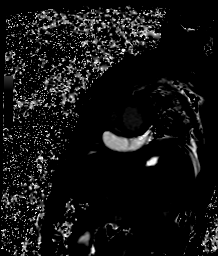

[Series 29: (id)_long_t1_moco_t1 · 1 of 3 slices shown (2 of 2)]
[im 1/3]
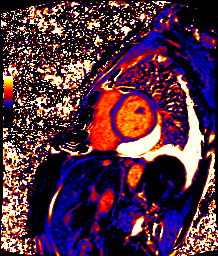

[Series 31: (id)_trufi · oblique · 8.0mm · 2.08mm/px · 1 of 9 slices shown]
[im 1/9]
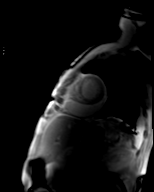

[Series 32: (id)_trufi_moco · oblique · 8.0mm · 2.08mm/px · 1 of 9 slices shown]
[im 1/9]
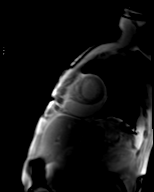

[Series 33: (id)_trufi_moco_t2 · oblique · 8.0mm · 2.08mm/px · 1 of 3 slices shown]
[im 1/3]
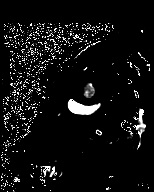

[Series 35: bSSFP · oblique · 6.0mm · 1.41mm/px · 1 of 25 slices shown (20 of 22)]
[im 1/25]
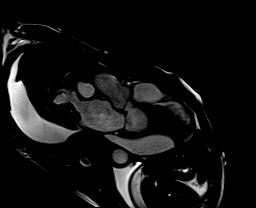

[Series 36: bSSFP · oblique · 6.0mm · 1.48mm/px · 1 of 25 slices shown (21 of 22)]
[im 1/25]
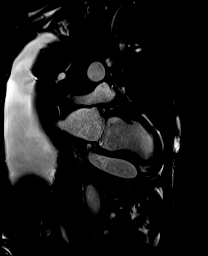

[Series 37: bSSFP · axial · 6.0mm · 1.41mm/px · 1 of 25 slices shown (22 of 22)]
[im 1/25]
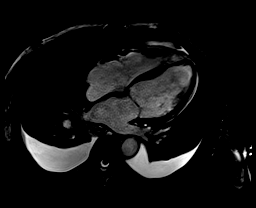

[Series 38: cine_trufi_short axis_cs_2_shot · oblique · 8.0mm · 1.48mm/px · 1 of 25 slices shown (1 of 15)]
[im 1/25]
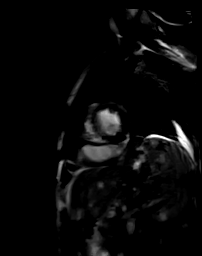

[Series 38: cine_trufi_short axis_cs_2_shot · oblique · 8.0mm · 1.48mm/px · 1 of 25 slices shown (2 of 15)]
[im 1/25]
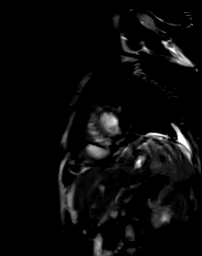

[Series 38: cine_trufi_short axis_cs_2_shot · oblique · 8.0mm · 1.48mm/px · 1 of 25 slices shown (3 of 15)]
[im 1/25]
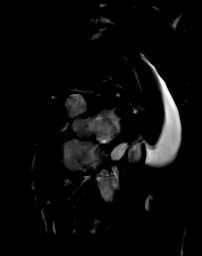

[Series 38: cine_trufi_short axis_cs_2_shot · oblique · 8.0mm · 1.48mm/px · 1 of 25 slices shown (4 of 15)]
[im 1/25]
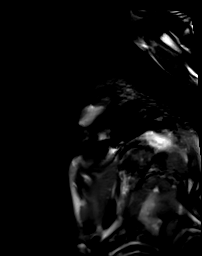

[Series 38: cine_trufi_short axis_cs_2_shot · oblique · 8.0mm · 1.48mm/px · 1 of 25 slices shown (5 of 15)]
[im 1/25]
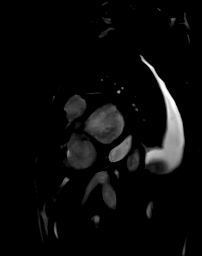

[Series 38: cine_trufi_short axis_cs_2_shot · oblique · 8.0mm · 1.48mm/px · 1 of 25 slices shown (6 of 15)]
[im 1/25]
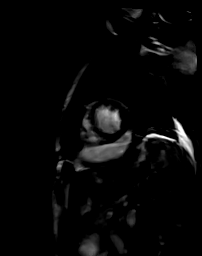

[Series 38: cine_trufi_short axis_cs_2_shot · oblique · 8.0mm · 1.48mm/px · 1 of 25 slices shown (7 of 15)]
[im 1/25]
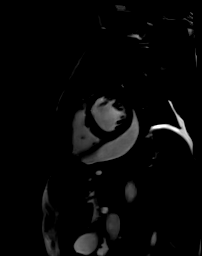

[Series 38: cine_trufi_short axis_cs_2_shot · oblique · 8.0mm · 1.48mm/px · 1 of 25 slices shown (8 of 15)]
[im 1/25]
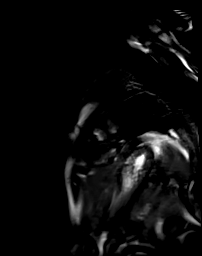

[Series 38: cine_trufi_short axis_cs_2_shot · oblique · 8.0mm · 1.48mm/px · 1 of 25 slices shown (9 of 15)]
[im 1/25]
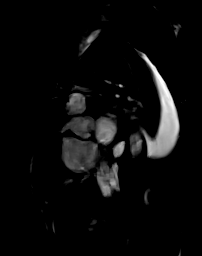

[Series 38: cine_trufi_short axis_cs_2_shot · oblique · 8.0mm · 1.48mm/px · 1 of 25 slices shown (10 of 15)]
[im 1/25]
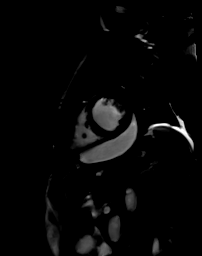

[Series 38: cine_trufi_short axis_cs_2_shot · oblique · 8.0mm · 1.48mm/px · 1 of 25 slices shown (11 of 15)]
[im 1/25]
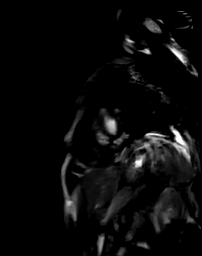

[Series 38: cine_trufi_short axis_cs_2_shot · oblique · 8.0mm · 1.48mm/px · 1 of 25 slices shown (12 of 15)]
[im 1/25]
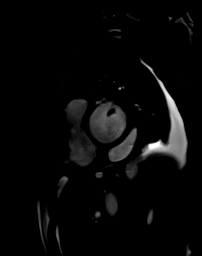

[Series 38: cine_trufi_short axis_cs_2_shot · oblique · 8.0mm · 1.48mm/px · 1 of 25 slices shown (13 of 15)]
[im 1/25]
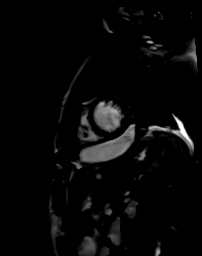

[Series 38: cine_trufi_short axis_cs_2_shot · oblique · 8.0mm · 1.48mm/px · 1 of 25 slices shown (14 of 15)]
[im 1/25]
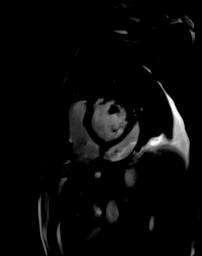

[Series 38: cine_trufi_short axis_cs_2_shot · oblique · 8.0mm · 1.48mm/px · 1 of 25 slices shown (15 of 15)]
[im 1/25]
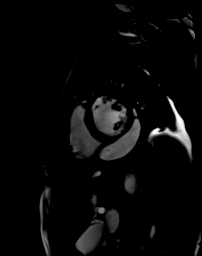

[45 of 48 positions shown; findings below may reference images not displayed]

FINDINGS: 1. Mildly dilated left ventricular size, with LVEDD 62 mm, and
LVEDVi 104 mL/m2.

Normal left ventricular thickness, with intraventricular septal
thickness of 8 mm, posterior wall thickness of 7 mm, and myocardial
mass index of 56 g/m2.

Severely reduced left ventricular systolic function (LVEF =29%).
There is basal inferior and inferolateral hypokinesis. There is
apical septal hypokinesis.

Left ventricular parametric mapping notable for significantly
elevated native T1 signal (4494 ms) and elevated T2 signal (75 ms)
in the apical septum.

There is late gadolinium enhancement in the left ventricular
myocardium: There is basal inferolateral LGE with wall thinning,
there is mid-apical septal LGE that is endocardial in nature.

2. Normal right ventricular size with RVEDVI 81 mL/m2.

Normal right ventricular thickness.

Mildly reduced right ventricular systolic function (RVEF =41%).
There are no regional wall motion abnormalities or aneurysms.

There is basal anterior free wall RV late gadolinium enhancement.

3.  Normal left and right atrial size by indexed volumes.

4. Normal size of the aortic root, ascending aorta and pulmonary
artery.

5. Valve assessment:

Aortic Valve: Qualitatively no significant regurgitation.

Pulmonic Valve: Qualitatively no significant regurgitation.

Tricuspid Valve: There appears to be at least moderate tricuspid
regurgitation- there is tricuspid regurgitation which refluxes into
the IVC.

Mitral Valve: Qualitatively mild regurgitation.

6. There is a large pericardial effusion posterior to the left and
right ventricles. There is mild IVC dilation (22 mm) without
evidence of RV or RA collapse.

7. Grossly, no extracardiac findings aside from sternotomy
consistent with prior CABG and bilateral pleural effusions.
Recommended dedicated study if concerned for non-cardiac pathology.

8. There is breathhold artifact noted most prominently during short
axis stack LV and RV function imaging.

9.  Frequent PVCs noted throughout study.
IMPRESSION: Decreased LV function.

There is late gadolinium enhancement that is multifocal, involves
the septum, and involves the RV free wall. Though some of these
findings can be seen in multi-vessel coronary disease, they are also
seen in cardiac sarcoidosis. Consider PET imaging if clinically
indicated.

## 2023-10-17 NOTE — Progress Notes (Signed)
 Remote ICD transmission.

## 2023-10-17 NOTE — Addendum Note (Signed)
 Addended by: Elease Etienne A on: 10/17/2023 01:54 PM   Modules accepted: Orders

## 2023-10-18 ENCOUNTER — Ambulatory Visit: Payer: BC Managed Care – PPO | Admitting: Psychiatry

## 2023-10-18 ENCOUNTER — Encounter (HOSPITAL_COMMUNITY): Payer: Self-pay

## 2023-10-18 DIAGNOSIS — F411 Generalized anxiety disorder: Secondary | ICD-10-CM | POA: Diagnosis not present

## 2023-10-18 NOTE — Progress Notes (Signed)
 [facility] Counselor/Therapist Progress Note  Patient ID: Shane [name], MRN: [id number],    Date: 10/18/2023  Time Spent:  48 minutes  Treatment Type: Individual Therapy  Reported Symptoms: anxiety (today mostly re: family and eventual retirement); depression decreased    Mental Status Exam:  Appearance:   Neat     Behavior:  Appropriate, Sharing, and Motivated  Motor:  Normal  Speech/Language:   Clear and Coherent  Affect:  anxiety  Mood:  anxious  Thought process:  goal directed  Thought content:    WNL  Sensory/Perceptual disturbances:    WNL  Orientation:  oriented to person, place, time/date, situation, day of week, month of year, year, and stated date of October 18, 2023  Attention:  Good  Concentration:  Good  Memory:  WNL  Fund of knowledge:   Good  Insight:    Good  Judgment:   Good  Impulse Control:  Good   Risk Assessment: Danger to Self:  No Self-injurious Behavior: No Danger to Others: No Duty to Warn:no Physical Aggression / Violence:No  Access to Firearms a concern: No  Gang Involvement:No   Subjective:    Patient today in session and continues to work on family issues and his anxiety mostly about job related stressors. Processing more about his job-related anxiety "which involves lots of moving targets and needing an exit strategy" as he is looking to retire within approximately a year or "soon after".  Discussed some of what he felt and "exit strategy" might involve or what might be helpful to him including "preplanning", and really being able to process his thoughts about retirement and what that might look like for him and what he hopes for, in addition to being able to manage other feelings that may arise during that process of closing out his job and retiring.  Processed some of his current anxiety today and anticipates that anxiety could be a factor closer to retirement also.  Looking at what has helped him better manage anxiety and what is not  helped.  Knows that he is very blessed in his situation to be able to retire comfortably without worrying about the finances.  He plans to think about this more in between sessions follow-up more in the future as he has a while before he is retired.  Regarding anxiety about other things, he is trying to pay more attention to triggers and be able to respond to anxiety in more timely and productive ways.  Some of the family issues that he worked on last session have been resolved as his adult son did find a job and patient is hopeful this will be a good step for son.   Interventions: Cognitive Behavioral Therapy and Ego-Supportive  Long Term Goal: Enhance the ability to handle effectively his depression and the full variety of life's anxieties.  Short Term Goal:  Verbalize an understanding of how thoughts, physical feelings, and behavioral actions contribute to anxiety and anger and its treatment. Strategy:  Patient will work to recognize and interrupt triggers to his anxious or depressive thoughts  so as to replace with positive, more reality-based thoughts, which lead to more positive actions/behaviors.   Diagnosis:   ICD-10-CM   1. Generalized anxiety disorder  F41.1      Plan:   Patient today in session and working more on his family related issues and his anxiety which he feels he is managing better.  Depression remains decreased.  Focusing on recognizing triggers to his  anxiety more quickly which he feels like will help him to better manage his anxiety and is concerned that he be able to do this into his retirement which is likely about a year off from now.  Family relationships more stable.  Encouraged patient to be practicing more positive and self affirming behaviors as noted in sessions including: More consistent use of positive self talk, staying focused in the present and on the things he can control versus what he cannot control, use of deep breathing exercises to help with anxiety, using  active listening skills with his wife and others, stay on his medication as prescribed, remain in contact with supportive people, participate as he is able and outside activities that he enjoys, and realize the strength he shows working with goal-directed behaviors to move in a direction that supports his overall emotional health and outlook into the future.  Brix Brearley has made progress and needs to continue working with his goal-directed behaviors that support his moving in a more positive and healthier direction into the future.  Goal review and progress/challenges noted with patient.  Next appt within 6 wks.   Mathis Fare, LCSW

## 2023-10-20 ENCOUNTER — Ambulatory Visit: Payer: BC Managed Care – PPO | Admitting: Adult Health

## 2023-10-20 ENCOUNTER — Encounter: Payer: Self-pay | Admitting: Adult Health

## 2023-10-20 DIAGNOSIS — F411 Generalized anxiety disorder: Secondary | ICD-10-CM | POA: Diagnosis not present

## 2023-10-20 DIAGNOSIS — F39 Unspecified mood [affective] disorder: Secondary | ICD-10-CM

## 2023-10-20 DIAGNOSIS — F5104 Psychophysiologic insomnia: Secondary | ICD-10-CM | POA: Diagnosis not present

## 2023-10-20 DIAGNOSIS — F410 Panic disorder [episodic paroxysmal anxiety] without agoraphobia: Secondary | ICD-10-CM

## 2023-10-20 MED ORDER — ESCITALOPRAM OXALATE 20 MG PO TABS: 20.0000 mg | ORAL_TABLET | Freq: Every day | ORAL | 1 refills | Status: DC

## 2023-10-20 MED ORDER — ARIPIPRAZOLE 5 MG PO TABS: 5.0000 mg | ORAL_TABLET | Freq: Every day | ORAL | 1 refills | Status: DC

## 2023-10-20 NOTE — Progress Notes (Signed)
 Shane [name] [id number] [birth year] 58 y.o.  Virtual Visit via Telephone Note  I connected with pt on 10/20/23 at  9:30 AM EDT by telephone and verified that I am speaking with the correct person using two identifiers.   I discussed the limitations, risks, security and privacy concerns of performing an evaluation and management service by telephone and the availability of in person appointments. I also discussed with the patient that there may be a patient responsible charge related to this service. The patient expressed understanding and agreed to proceed.   I discussed the assessment and treatment plan with the patient. The patient was provided an opportunity to ask questions and all were answered. The patient agreed with the plan and demonstrated an understanding of the instructions.   The patient was advised to call back or seek an in-person evaluation if the symptoms worsen or if the condition fails to improve as anticipated.  I provided 25 minutes of non-face-to-face time during this encounter.  The patient was located at home.  The provider was located at [facility].  Dorothyann Gibbs, NP  Subjective:   Patient ID:  Shane [name] is a 58 y.o. (DOB 11-Mar-1959) male.  Chief Complaint: No chief complaint on file.   HPI Shane [name] presents for follow-up of GAD, insomnia, mood disorder, panic attacks.   Describes mood today as "ok". Pleasant. Denies tearfulness. Mood symptoms - denies depression and irritability. Reports situational anxiety - work issues. Denies panic attacks. Denies worry, rumination, and over thinking. Mood is stable. Stating "I feel like I'm doing good". Feels like medications continue to work well.   Stable interest and motivation. Taking medications as prescribed.  Energy levels stable. Active, does not have a regular exercise routine. Enjoys some usual interests and activities. Married. Lives with wife - 3 cats - 1 dog. Has 2 grown  sons. Family in [place].  Appetite adequate. Weight stable - 160 pounds.  Sleeps well most nights. Averages 6 to 7 hours. Using CPAP machine. Focus and concentration stable. Completing tasks. Managing aspects of household. Work going well [employer]. Denies SI or HI.  Denies AH or VH. Denies self harm. Denies substance use.  Review of Systems:  Review of Systems  Musculoskeletal:  Negative for gait problem.  Neurological:  Negative for tremors.  Psychiatric/Behavioral:         Please refer to HPI   Medications: I have reviewed the patient's current medications.  Current Outpatient Medications  Medication Sig Dispense Refill   acetaminophen (TYLENOL) 500 MG tablet Take 500 mg by mouth every 6 (six) hours as needed for headache (pain).     ARIPiprazole (ABILIFY) 5 MG tablet Take 1 tablet (5 mg total) by mouth daily. 90 tablet 1   aspirin 81 MG tablet Take 81 mg by mouth daily.     Bevacizumab (AVASTIN IV) Inject 1 Dose into the eye See admin instructions. Every six to eight weeks in left eye     carvedilol (COREG) 3.125 MG tablet Take 1 tablet by mouth twice daily. 180 tablet 3   ciprofloxacin (CILOXAN) 0.3 % ophthalmic solution Place 1 drop into the left eye 4 (four) times daily. After Eye injection every 6-8 weeks for two days 5 mL 1   escitalopram (LEXAPRO) 20 MG tablet Take 1 tablet (20 mg total) by mouth daily. 90 tablet 1   FARXIGA 10 MG TABS tablet TAKE 1 TABLET BY MOUTH DAILY BEFORE BREAKFAST 30 tablet 6   levothyroxine (SYNTHROID)  50 MCG tablet Take 1 tablet (50 mcg total) by mouth daily. 90 tablet 0   LORazepam (ATIVAN) 0.5 MG tablet Take 1 tablet (0.5 mg total) by mouth 2 (two) times daily as needed for anxiety. 60 tablet 2   losartan (COZAAR) 25 MG tablet Take 1 tablet by mouth daily. 90 tablet 2   metFORMIN (GLUCOPHAGE-XR) 500 MG 24 hr tablet Take 1 tablet (500 mg total) by mouth daily with breakfast.     rosuvastatin (CRESTOR) 20 MG tablet Take 1 tablet (20 mg total) by  mouth daily. 90 tablet 3   sildenafil (VIAGRA) 100 MG tablet Take 1 tablet (100 mg total) by mouth daily as needed for erectile dysfunction. 10 tablet 5   spironolactone (ALDACTONE) 25 MG tablet TAKE 1/2 TABLET(12.5 MG) BY MOUTH AT BEDTIME 15 tablet 3   torsemide (DEMADEX) 20 MG tablet Take 20 mg by mouth daily as needed (swelling).     ursodiol (ACTIGALL) 500 MG tablet Take 500 mg by mouth 2 (two) times daily.     No current facility-administered medications for this visit.    Medication Side Effects: None  Allergies: No Known Allergies  Past Medical History:  Diagnosis Date   CAD (coronary artery disease), s/p stent 2011 and CABG 2015 [facility])    Hodgkin disease [facility]) 1997   Hyperlipidemia    Macular degeneration    PVC's (premature ventricular contractions)     Family History  Problem Relation Age of Onset   Stroke Mother    Cancer Mother    COPD Mother    Heart disease Father    COPD Father    Heart attack Father    Heart Problems Brother        heart valve replaced at 65 years old   Heart Problems Brother        MI at age 58; CABG at age 47   Cancer Maternal Grandmother    Colon cancer Neg Hx     Social History   Socioeconomic History   Marital status: Married    Spouse name: Not on file   Number of children: Not on file   Years of education: Not on file   Highest education level: Not on file  Occupational History   Not on file  Tobacco Use   Smoking status: Never   Smokeless tobacco: Former   Tobacco comments:    a long time ago  Vaping Use   Vaping status: Never Used  Substance and Sexual Activity   Alcohol use: Yes    Alcohol/week: 3.0 standard drinks of alcohol    Types: 3 Cans of beer per week    Comment: sometimes 3 beers daily   Drug use: No   Sexual activity: Yes  Other Topics Concern   Not on file  Social History Narrative   Not on file   Social Drivers of Health   Financial Resource Strain: Not on file  Food Insecurity:  Not on file  Transportation Needs: Not on file  Physical Activity: Not on file  Stress: Not on file  Social Connections: Not on file  Intimate Partner Violence: Not on file    Past Medical History, Surgical history, Social history, and Family history were reviewed and updated as appropriate.   Please see review of systems for further details on the patient's review from today.   Objective:   Physical Exam:  There were no vitals taken for this visit.  Physical Exam Constitutional:  General: He is not in acute distress. Musculoskeletal:        General: No deformity.  Neurological:     Mental Status: He is alert and oriented to person, place, and time.     Coordination: Coordination normal.  Psychiatric:        Attention and Perception: Attention and perception normal. He does not perceive auditory or visual hallucinations.        Mood and Affect: Mood normal. Mood is not anxious or depressed. Affect is not labile, blunt, angry or inappropriate.        Speech: Speech normal.        Behavior: Behavior normal.        Thought Content: Thought content normal. Thought content is not paranoid or delusional. Thought content does not include homicidal or suicidal ideation. Thought content does not include homicidal or suicidal plan.        Cognition and Memory: Cognition and memory normal.        Judgment: Judgment normal.     Comments: Insight intact    Lab Review:     Component Value Date/Time   NA 138 10/13/2023 1008   NA 140 08/24/2022 1217   NA 141 12/24/2015 1051   K 4.9 10/13/2023 1008   K 4.6 12/24/2015 1051   CL 104 10/13/2023 1008   CO2 27 10/13/2023 1008   CO2 28 12/24/2015 1051   GLUCOSE 91 10/13/2023 1008   GLUCOSE 95 12/24/2015 1051   BUN 18 10/13/2023 1008   BUN 18 08/24/2022 1217   BUN 14.6 12/24/2015 1051   CREATININE 1.14 10/13/2023 1008   CREATININE 1.1 12/24/2015 1051   CALCIUM 9.6 10/13/2023 1008   CALCIUM 9.8 12/24/2015 1051   PROT 6.8 10/13/2023  1008   PROT 6.8 07/24/2021 1131   PROT 7.6 12/24/2015 1051   ALBUMIN 4.6 10/13/2023 1008   ALBUMIN 4.3 07/24/2021 1131   ALBUMIN 4.5 12/24/2015 1051   AST 22 10/13/2023 1008   AST 25 12/24/2015 1051   ALT 20 10/13/2023 1008   ALT 34 12/24/2015 1051   ALKPHOS 74 10/13/2023 1008   ALKPHOS 45 12/24/2015 1051   BILITOT 0.7 10/13/2023 1008   BILITOT 1.7 (H) 07/24/2021 1131   BILITOT 0.99 12/24/2015 1051   GFRNONAA >60 05/10/2023 1442       Component Value Date/Time   WBC 6.3 10/13/2023 1008   RBC 4.35 10/13/2023 1008   HGB 14.2 10/13/2023 1008   HGB 14.2 08/24/2022 1217   HGB 15.7 12/24/2015 1050   HCT 42.5 10/13/2023 1008   HCT 42.0 08/24/2022 1217   HCT 44.7 12/24/2015 1050   PLT 273.0 10/13/2023 1008   PLT 264 08/24/2022 1217   MCV 97.8 10/13/2023 1008   MCV 95 08/24/2022 1217   MCV 93 12/24/2015 1050   MCH 32.1 04/19/2023 1745   MCHC 33.3 10/13/2023 1008   RDW 13.5 10/13/2023 1008   RDW 12.6 08/24/2022 1217   RDW 12.9 12/24/2015 1050   LYMPHSABS 1.5 10/13/2023 1008   LYMPHSABS 1.3 12/24/2015 1050   MONOABS 0.5 10/13/2023 1008   EOSABS 0.1 10/13/2023 1008   EOSABS 0.1 12/24/2015 1050   BASOSABS 0.0 10/13/2023 1008   BASOSABS 0.0 12/24/2015 1050    No results found for: "POCLITH", "LITHIUM"   No results found for: "PHENYTOIN", "PHENOBARB", "VALPROATE", "CBMZ"   .res Assessment: Plan:    Plan:  Lexapro 20mg  daily Abilify 5mg  daily.  Ativan 0.5mg  BID - uses twice a month - none needed  Continue therapy with Rockne Menghini.   RTC 6 months  25 minutes spent dedicated to the care of this patient on the date of this encounter to include pre-visit review of records, ordering of medication, post visit documentation, and face-to-face time with the patient discussing GAD, insomnia, mood disorder, panic attacks. Discussed continuing current medication regimen.  Patient advised to contact office with any questions, adverse effects, or acute worsening in signs and  symptoms.  Discussed potential benefits, risk, and side effects of benzodiazepines to include potential risk of tolerance and dependence, as well as possible drowsiness.  Advised patient not to drive if experiencing drowsiness and to take lowest possible effective dose to minimize risk of dependence and tolerance.   Discussed potential metabolic side effects associated with atypical antipsychotics, as well as potential risk for movement side effects. Advised pt to contact office if movement side effects occur.   Diagnoses and all orders for this visit:  Episodic mood disorder (HCC) Comments: Marriage issues. Wife with mood disorder. Orders: -     escitalopram (LEXAPRO) 20 MG tablet; Take 1 tablet (20 mg total) by mouth daily.  Generalized anxiety disorder -     ARIPiprazole (ABILIFY) 5 MG tablet; Take 1 tablet (5 mg total) by mouth daily.  Psychophysiological insomnia  Panic attacks     Please see After Visit Summary for patient specific instructions.  Future Appointments  Date Time Provider Department Center  11/24/2023 12:25 PM Sherrie George, MD [facility] None  12/16/2023  7:05 AM [facility] DEVICE REMOTES [facility]  12/20/2023 11:00 AM Mathis Fare, LCSW [facility] None  03/16/2024  7:05 AM [facility] DEVICE REMOTES [facility]  06/15/2024  7:05 AM [facility] DEVICE REMOTES [facility]  09/14/2024  7:05 AM [facility] DEVICE REMOTES [facility]  10/16/2024 10:30 AM Willow Ora, MD [facility]    No orders of the defined types were placed in this encounter.     -------------------------------

## 2023-10-21 ENCOUNTER — Telehealth (HOSPITAL_COMMUNITY): Payer: Self-pay

## 2023-10-21 NOTE — Telephone Encounter (Signed)
Spoke with patient regarding the following results. Patient made aware and patient verbalized understanding.   Advised patient to call back to office with any issues, questions, or concerns. Patient verbalized understanding.

## 2023-10-21 NOTE — Telephone Encounter (Signed)
-----   Message from Arvilla Meres sent at 10/17/2023  4:37 PM EDT ----- Heart function low normal.  No active sarcoid

## 2023-11-07 ENCOUNTER — Other Ambulatory Visit: Payer: Self-pay | Admitting: Family Medicine

## 2023-11-07 MED ORDER — METFORMIN HCL ER 500 MG PO TB24: 500.0000 mg | ORAL_TABLET | Freq: Every day | ORAL | Status: DC

## 2023-11-24 ENCOUNTER — Encounter (INDEPENDENT_AMBULATORY_CARE_PROVIDER_SITE_OTHER): Admitting: Ophthalmology

## 2023-11-24 DIAGNOSIS — B399 Histoplasmosis, unspecified: Secondary | ICD-10-CM | POA: Diagnosis not present

## 2023-11-24 DIAGNOSIS — H35033 Hypertensive retinopathy, bilateral: Secondary | ICD-10-CM

## 2023-11-24 DIAGNOSIS — H348312 Tributary (branch) retinal vein occlusion, right eye, stable: Secondary | ICD-10-CM

## 2023-11-24 DIAGNOSIS — H353221 Exudative age-related macular degeneration, left eye, with active choroidal neovascularization: Secondary | ICD-10-CM

## 2023-11-24 DIAGNOSIS — H43813 Vitreous degeneration, bilateral: Secondary | ICD-10-CM

## 2023-11-24 DIAGNOSIS — I10 Essential (primary) hypertension: Secondary | ICD-10-CM

## 2023-11-29 ENCOUNTER — Ambulatory Visit (HOSPITAL_COMMUNITY)
Admission: RE | Admit: 2023-11-29 | Discharge: 2023-11-29 | Disposition: A | Discharge status: Home / Self Care | Source: Ambulatory Visit | Attending: Internal Medicine | Admitting: Internal Medicine

## 2023-11-29 ENCOUNTER — Encounter (HOSPITAL_COMMUNITY): Payer: Self-pay | Admitting: Internal Medicine

## 2023-11-29 ENCOUNTER — Encounter: Payer: Self-pay | Admitting: Family Medicine

## 2023-11-29 VITALS — BP 114/83 | HR 69 | Ht 67.5 in | Wt 165.4 lb

## 2023-11-29 DIAGNOSIS — Z7984 Long term (current) use of oral hypoglycemic drugs: Secondary | ICD-10-CM | POA: Diagnosis not present

## 2023-11-29 DIAGNOSIS — E119 Type 2 diabetes mellitus without complications: Secondary | ICD-10-CM | POA: Diagnosis not present

## 2023-11-29 DIAGNOSIS — Z79899 Other long term (current) drug therapy: Secondary | ICD-10-CM | POA: Diagnosis not present

## 2023-11-29 DIAGNOSIS — I493 Ventricular premature depolarization: Secondary | ICD-10-CM | POA: Diagnosis not present

## 2023-11-29 DIAGNOSIS — I2510 Atherosclerotic heart disease of native coronary artery without angina pectoris: Secondary | ICD-10-CM

## 2023-11-29 DIAGNOSIS — I10 Essential (primary) hypertension: Secondary | ICD-10-CM

## 2023-11-29 DIAGNOSIS — G4733 Obstructive sleep apnea (adult) (pediatric): Secondary | ICD-10-CM | POA: Diagnosis not present

## 2023-11-29 DIAGNOSIS — Z95810 Presence of automatic (implantable) cardiac defibrillator: Secondary | ICD-10-CM | POA: Insufficient documentation

## 2023-11-29 DIAGNOSIS — I252 Old myocardial infarction: Secondary | ICD-10-CM | POA: Insufficient documentation

## 2023-11-29 DIAGNOSIS — Z955 Presence of coronary angioplasty implant and graft: Secondary | ICD-10-CM | POA: Insufficient documentation

## 2023-11-29 DIAGNOSIS — I255 Ischemic cardiomyopathy: Secondary | ICD-10-CM | POA: Insufficient documentation

## 2023-11-29 DIAGNOSIS — I5022 Chronic systolic (congestive) heart failure: Secondary | ICD-10-CM | POA: Insufficient documentation

## 2023-11-29 DIAGNOSIS — I3139 Other pericardial effusion (noninflammatory): Secondary | ICD-10-CM | POA: Insufficient documentation

## 2023-11-29 DIAGNOSIS — I110 Hypertensive heart disease with heart failure: Secondary | ICD-10-CM | POA: Diagnosis not present

## 2023-11-29 DIAGNOSIS — I25810 Atherosclerosis of coronary artery bypass graft(s) without angina pectoris: Secondary | ICD-10-CM | POA: Diagnosis not present

## 2023-11-29 NOTE — Patient Instructions (Signed)
 No Labs done today.   No medication changes were made. Please continue all current medications as prescribed.  Your physician recommends that you schedule a follow-up appointment as needed  If you have any questions or concerns before your next appointment please send us  a message through [facility] or call our office at [phone].    TO LEAVE A MESSAGE FOR THE NURSE SELECT OPTION 2, PLEASE LEAVE A MESSAGE INCLUDING: YOUR NAME DATE OF BIRTH CALL BACK NUMBER REASON FOR CALL**this is important as we prioritize the call backs  YOU WILL RECEIVE A CALL BACK THE SAME DAY AS LONG AS YOU CALL BEFORE 4:00 PM   Do the following things EVERYDAY: Weigh yourself in the morning before breakfast. Write it down and keep it in a log. Take your medicines as prescribed Eat low salt foods--Limit salt (sodium) to 2000 mg per day.  Stay as active as you can everyday Limit all fluids for the day to less than 2 liters   At the [facility], you and your health needs are our priority. As part of our continuing mission to provide you with exceptional heart care, we have created designated Provider Care Teams. These Care Teams include your primary Cardiologist (physician) and Advanced Practice Providers (APPs- Physician Assistants and Nurse Practitioners) who all work together to provide you with the care you need, when you need it.   You may see any of the following providers on your designated Care Team at your next follow up: Dr Jules Oar Dr Peder Bourdon Dr. Mimi Alt, NP Ruddy Corral, [place] [facility] [place], [place] Dennise Fitz, NP Luster Salters, PharmD   Please be sure to bring in all your medications bottles to every appointment.    Thank you for choosing  [facility]

## 2023-11-29 NOTE — Progress Notes (Signed)
 [facility] PROGRESS NOTE  Primary Care: Luevenia Saha, MD General Cardiologist: Dr. Katheryne Pane HF Cardiologist: Dr. Julane Ny   Reason for Visit: F/u chronic systolic heart failure  HPI: Shane [name] is a 58 y.o. 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   Echo at time of CABG showed moderately reduced LVEF, 30-35%. EF improved post revascularization and had normalized, back to 50-55%, on repeat echo in 2016. In 2017, he underwent cardiac w/u for decrease exercise tolerance. Echo w/ normal LVEF, 50-55%, no significant valvular diease.    Echo 1/23 EF back down to 30-35%, RV mildly reduced, mild-mod MR. Mod TR.    R/LHC on 1/23 occluded native RCA and RCA vein graft with left-to-right collaterals. The remainder of his grafts were patent.  All his native arteries were occluded. RHC showed elevated right sided filling pressures and marginal CO (see hemodynamics below). He was direct admitted for IV diuretics and AHF consultation. He was also observed to have frequent PVCs ~25/min and previous records showed where prior Zio monitor had also showed high burden PVCs ~13%. He was placed on mexiletine for suppression but c/w frequent PVCs, was later switched to amio 200 mg bid. cMRI EF 29% c/w ischemic CM w/ coronary disease pattern LGE update/ scar. Also noted to have large posterior pericardial effusion w/o tamponade as well as moderate TR.   He had repeat Zio 2/23 while on amio + mexiletine. This showed reduction in PVC burden from 13 to 8.8%. He was subsequently referred to EP for PVC ablation. Seen by Dr. Lawana Pray. Given concern for side effects w/ long term use of amio + pt preference, amiodarone  was discontinued and mexiletine was increased to 300 mg bid. Dr. Lawana Pray recommended repeat Zio to re quantify burden on monotherapy. Zio 6/23 19.1% PVCs. Underwent  PVC ablation 6/23  Dr. Loetta Ringer following for OSA. AHI 18.1 started on bipap  Echo 11/10/21 EF 30-35% Zio 9/23: 2.4% PVCs Echo 9/23 EF 25-30%, mild LVH, grade 2 DD, RV mildly reduced Zio 9/23 showed mostly NSR, rare PACs, 2.4% PVC burden S/p MDT ICD placement 2/24.  Here for routine f/u. Feels good. Active. No CP or SOB. No ICD firings. Not wearing Bipap much. No problems with meds. Occasional orthostasis when standing    Cardiac Studies  - Zio (9/23): mostly NSR, rare PACs, 2.4% PVC burden  - Echo (9/23): EF 25-30%, mild LVH, grade 2 DD, RV mildly reduced  - Zio (2/23) - PVC burden 8.8%   - Echo (1/23): EF 30-35%, moderate LV dysfunction, global LV HK, RV mildly reduced, RVSP 30.7 mmHg, mild to mod MR, moderate TR   - R/LHC (1/23): occluded RCA and RCA vein graft with left-to-right collaterals.  The remainder of his grafts are patent.  All his native arteries were occluded.   Hemodynamics: 1: Right atrial pressure-17/17 2: Right ventricular pressure-51/23, mean 27 3: Pulmonary artery pressure-57/9, mean 35 4: Pulmonary wedge pressure-A-wave 28, V wave 24, mean 17 5: Cardiac output-3.7 L/min with an index of 2 L/min/m by Fick 6: LVEDP - 24  - Cardiac MRI (08/04/21): Ischemic scar pattern.  LVEF 29% RVEF 41% Mod TR Large posterior pericardial effusion w/o tamponade   Past Medical History:  Diagnosis Date   CAD (coronary artery disease), s/p stent 2011 and CABG 2015 [facility])    Hodgkin disease (  HCC) 1997   Hyperlipidemia    Macular degeneration    PVC's (premature ventricular contractions)    Current Outpatient Medications  Medication Sig Dispense Refill   acetaminophen  (TYLENOL ) 500 MG tablet Take 500 mg by mouth every 6 (six) hours as needed for headache (pain).     ARIPiprazole  (ABILIFY ) 5 MG tablet Take 1 tablet (5 mg total) by mouth daily. 90 tablet 1   aspirin  81 MG tablet Take 81 mg by mouth daily.     Bevacizumab  (AVASTIN  IV) Inject 1 Dose into the  eye See admin instructions. Every six to eight weeks in left eye     carvedilol  (COREG ) 3.125 MG tablet Take 1 tablet by mouth twice daily. 180 tablet 3   ciprofloxacin  (CILOXAN ) 0.3 % ophthalmic solution Place 1 drop into the left eye 4 (four) times daily. After Eye injection every 6-8 weeks for two days 5 mL 1   escitalopram  (LEXAPRO ) 20 MG tablet Take 1 tablet (20 mg total) by mouth daily. 90 tablet 1   FARXIGA  10 MG TABS tablet TAKE 1 TABLET BY MOUTH DAILY BEFORE BREAKFAST 30 tablet 6   levothyroxine  (SYNTHROID ) 50 MCG tablet Take 1 tablet (50 mcg total) by mouth daily. 90 tablet 0   LORazepam  (ATIVAN ) 0.5 MG tablet Take 1 tablet (0.5 mg total) by mouth 2 (two) times daily as needed for anxiety. 60 tablet 2   losartan  (COZAAR ) 25 MG tablet Take 1 tablet by mouth daily. 90 tablet 2   metFORMIN  (GLUCOPHAGE -XR) 500 MG 24 hr tablet Take 1 tablet (500 mg total) by mouth daily with breakfast.     rosuvastatin  (CRESTOR ) 20 MG tablet Take 1 tablet (20 mg total) by mouth daily. 90 tablet 3   sildenafil  (VIAGRA ) 100 MG tablet Take 1 tablet (100 mg total) by mouth daily as needed for erectile dysfunction. 10 tablet 5   spironolactone  (ALDACTONE ) 25 MG tablet TAKE 1/2 TABLET(12.5 MG) BY MOUTH AT BEDTIME 15 tablet 3   torsemide  (DEMADEX ) 20 MG tablet Take 20 mg by mouth daily as needed (swelling).     ursodiol  (ACTIGALL ) 500 MG tablet Take 500 mg by mouth 2 (two) times daily.     No current facility-administered medications for this encounter.   No Known Allergies  Social History   Socioeconomic History   Marital status: Married    Spouse name: Not on file   Number of children: Not on file   Years of education: Not on file   Highest education level: Not on file  Occupational History   Not on file  Tobacco Use   Smoking status: Never   Smokeless tobacco: Former   Tobacco comments:    a long time ago  Vaping Use   Vaping status: Never Used  Substance and Sexual Activity   Alcohol use: Yes     Alcohol/week: 3.0 standard drinks of alcohol    Types: 3 Cans of beer per week    Comment: sometimes 3 beers daily   Drug use: No   Sexual activity: Yes  Other Topics Concern   Not on file  Social History Narrative   Not on file   Social Drivers of Health   Financial Resource Strain: Not on file  Food Insecurity: Not on file  Transportation Needs: Not on file  Physical Activity: Not on file  Stress: Not on file  Social Connections: Not on file  Intimate Partner Violence: Not on file   Family History  Problem Relation Age of Onset  Stroke Mother    Cancer Mother    COPD Mother    Heart disease Father    COPD Father    Heart attack Father    Heart Problems Brother        heart valve replaced at 34 years old   Heart Problems Brother        MI at age 44; CABG at age 14   Cancer Maternal Grandmother    Colon cancer Neg Hx    BP 114/83   Pulse 69   Ht 5' 7.5" (1.715 m)   Wt 75 kg (165 lb 6.4 oz)   SpO2 96%   BMI 25.52 kg/m   Wt Readings from Last 3 Encounters:  11/29/23 75 kg (165 lb 6.4 oz)  10/13/23 72.1 kg (159 lb)  08/10/23 72.8 kg (160 lb 6.4 oz)   PHYSICAL EXAM: General:  Well appearing. No resp difficulty HEENT: normal Neck: supple. no JVD. Carotids 2+ bilat; no bruits. No lymphadenopathy or thryomegaly appreciated. Cor: PMI nondisplaced. Regular rate & rhythm. No rubs, gallops or murmurs. Lungs: clear Abdomen: soft, nontender, nondistended. No hepatosplenomegaly. No bruits or masses. Good bowel sounds. Extremities: no cyanosis, clubbing, rash, edema Neuro: alert & orientedx3, cranial nerves grossly intact. moves all 4 extremities w/o difficulty. Affect pleasant  Device interrogation (personally reviewed): Optivol ok. No VT/AF. Fluid ok. Activity level 5.4h/day Personally reviewed   ASSESSMENT & PLAN:  1. Chronic Systolic Heart Failure  - prior h/o ischemic CM, that improved post revascularization. - EF 30-35% at time of CABG in 2015, EF  normalized on repeat Echos in 2016 and 2017 (50-55%) - Echo (1/23): EF back down, 30-35%, RV mildly reduced.  - R/LHC (1/23) w/ occluded SVG-RCA but distal RCA filled by L>R collaterals. All other grafts patent. EF out of proportion to degree of CAD. RHC w/ elevated Rt sided filling pressures w/ fairly normal PCWP ~17. CO marginal, CI 2.0.  - Suspect PVC - mediated CM - cMRI (1/23) EF 29% with inferior and inferlolateral scar with some RV LGE with ? Sarcoid - Echo 5/23 EF 30-35%  - Repeat Zio 6/23 19.1% PVC - PVC ablation (6/23) - Echo (9/23): EF 25-30%, mild LVH, grade 2 DD, RV mildly reduced - Echo 11/24 EF 40-45%  - Zio (9/23): mostly NSR, rare PACs, 2.4% PVC burden - PET scan 3/25 EF 50% no sarcoid + infarct  - s/p MDT ICD - Doing great. EF has normalized and NYHA I - Volume status looks good  - Continue losartan  25 mg daily  - Continue spiro 12.5 daily - Continue Farxiga  10 mg daily. - Continue Coreg  3.125 mg bid. - Device interrogation as above   2. PVCs - High PVC burden, Holter 5/21 showed 13% burden.  - Suspect contributing to CM. - No coronary ischemia on cath.  - Zio 2/23 w/ burden down to 8.8% on dual AAD therapy w/ amio + mexilitine  - Zio 5/23 19.1% PVC - PVC ablation 6/23 - Zio 9/23: 2.4% PVCs - PVCs much improved after PVC ablation. Encouraged him to be compliant with Bipap.    3. CAD - remote MI 2011, s/p PCI to pLAD - CABG x 4 in 2015 - LHC (1/23): w/ 3/4 patent grafts, occluded SVG- RCA w/ L>R collaterals.  - Last LDL 10/13/23 at goal 57 - No s/s angina - Continue ASA/statin   4. HTN - Blood pressure well controlled. Continue current regimen.   6. OSA - Has BiPap, followed by Dr. Loetta Ringer -  Encouraged him to use more regualrly  7. Type 2DM  - Recent A1C 6.1 - on Metformin  + Farxiga     Can graduate HF Clinic and f/u with Dr. Katheryne Pane. Welcome to return here as needed.   Jules Oar, MD  2:29 PM

## 2023-12-09 ENCOUNTER — Encounter (INDEPENDENT_AMBULATORY_CARE_PROVIDER_SITE_OTHER): Admitting: Ophthalmology

## 2023-12-13 MED ORDER — METFORMIN HCL ER 500 MG PO TB24: 500.0000 mg | ORAL_TABLET | Freq: Every day | ORAL | 3 refills | Status: AC

## 2023-12-15 ENCOUNTER — Other Ambulatory Visit: Payer: Self-pay | Admitting: Family Medicine

## 2023-12-15 ENCOUNTER — Other Ambulatory Visit: Payer: Self-pay | Admitting: Cardiovascular Disease

## 2023-12-15 DIAGNOSIS — E785 Hyperlipidemia, unspecified: Secondary | ICD-10-CM

## 2023-12-16 ENCOUNTER — Ambulatory Visit (INDEPENDENT_AMBULATORY_CARE_PROVIDER_SITE_OTHER): Payer: BC Managed Care – PPO

## 2023-12-16 DIAGNOSIS — I255 Ischemic cardiomyopathy: Secondary | ICD-10-CM

## 2023-12-16 LAB — CUP PACEART REMOTE DEVICE CHECK
Battery Remaining Longevity: 156 mo
Battery Voltage: 3.01 V
Brady Statistic RA Percent Paced: INVALID
Brady Statistic RV Percent Paced: 0.06 %
Date Time Interrogation Session: 20250605224446
HighPow Impedance: 57 Ohm
Implantable Lead Connection Status: 753985
Implantable Lead Implant Date: 20240228
Implantable Lead Location: 753860
Implantable Pulse Generator Implant Date: 20240228
Lead Channel Impedance Value: 247 Ohm
Lead Channel Impedance Value: 304 Ohm
Lead Channel Pacing Threshold Amplitude: 0.75 V
Lead Channel Pacing Threshold Pulse Width: 0.4 ms
Lead Channel Sensing Intrinsic Amplitude: 9.8 mV
Lead Channel Setting Pacing Amplitude: 2 V
Lead Channel Setting Pacing Pulse Width: 0.4 ms
Lead Channel Setting Sensing Sensitivity: 0.3 mV
Zone Setting Status: 755011

## 2023-12-20 ENCOUNTER — Ambulatory Visit: Admitting: Psychiatry

## 2023-12-20 DIAGNOSIS — F411 Generalized anxiety disorder: Secondary | ICD-10-CM

## 2023-12-20 NOTE — Progress Notes (Signed)
 [facility] Counselor/Therapist Progress Note  Patient ID: Shane [name], MRN: [id number],    Date: 12/20/2023  Time Spent: 53 minutes   Treatment Type: Individual Therapy  Reported Symptoms: anxiety re: family and eventual retirement, "very little depression"   Mental Status Exam:  Appearance:   Casual and Neat     Behavior:  Appropriate, Sharing, and Motivated  Motor:  Normal  Speech/Language:   Clear and Coherent  Affect:  anxious  Mood:  anxious  Thought process:  goal directed  Thought content:    WNL  Sensory/Perceptual disturbances:    WNL  Orientation:  oriented to person, place, time/date, situation, day of week, month of year, year, and stated date of December 20, 2023  Attention:  Good  Concentration:  Good  Memory:  WNL  Fund of knowledge:   Good  Insight:    Good  Judgment:   Good  Impulse Control:  Good   Risk Assessment: Danger to Self:  No Self-injurious Behavior: No Danger to Others: No Duty to Warn:no Physical Aggression / Violence:No  Access to Firearms a concern: No  Gang Involvement:No   Subjective: Patient in session and working further today on his anxiety and family issues/relationships and pending retirement. "Very little depression" and trying to manage my anxiety. Imagines having some sadness, and some excitement in closing out a job. Talking through today his anxiety and some stress as he shares his thoughts/plans re: eventual retirement. Anxiety at work has decreased some, aches ups for some of his anxiety at retirement.  His future including retirement and family situations seem to be most important to him to talk about today in session which we identified early in the session.  He participated quite openly in session sharing what may be more emotional issues and related to his overall wellbeing as he enters retirement after another year of working.  Discussed some things he might want to be considering and even practicing more between now  and when his actual retirement time comes.  Feels that he definitely will be able to "let go" at work and does want to leave his job at a point where somebody else can step in and pick right up and move forward.  Spoke some about his wife's health concerns and the impact that has on him, although she is doing better and most ways more recently except for a hurt foot due to an accident.  Patient plans to keep "preplanning" as he works on his "exit strategy" over the next few months.  Realizing he may have additional feelings of the closure he gets to retirement but for now he is feeling positive and motivated that he is on the right track except for occasional anxiety or uncertainty, which he acknowledged is probably normal.  Looks again further with patient today on what has helped him manage anxiety previously and what has not helped.  More aware of triggers to his anxiety which helps him in responding.  On a good note, he states that some of the family issues have improved or resolved themselves and this adds to his sense of hopefulness.  May do some journaling between now and over the next few months as he anticipates retirement.   Interventions: Cognitive Behavioral Therapy, Solution-Oriented/Positive Psychology, and Ego-Supportive Long Term Goal: Enhance the ability to handle effectively his depression and the full variety of life's anxieties.  Short Term Goal:  Verbalize an understanding of how thoughts, physical feelings, and behavioral actions contribute to  anxiety and anger and its treatment. Strategy:  Patient will work to recognize and interrupt triggers to his anxious or depressive thoughts  so as to replace with positive, more reality-based thoughts, which lead to more positive actions/behaviors.   Diagnosis:   ICD-10-CM   1. Generalized anxiety disorder  F41.1      Plan: Patient working today in session further on his personal and family related concerns, in addition to some added  anxiety that he states is related to upcoming retirement even though he has almost a year to go.  Reports that his anxiety is mostly related to family and his eventual retirement.  On a good note, he shares that his depression has become more minimal at this point and feels encouraged by this.  Continues to recognize triggers to his anxiety in a more timely manner and he feels that this is helping his overall management of his anxiety.  Reminded and encouraged patient to be practicing more positive and self affirming behaviors as noted in sessions including: More consistent use of positive self talk, staying focused in the present and on the things he can control versus what he cannot control, use of deep breathing exercises to help with anxiety, using active listening skills with his wife and others, staying on his medication as prescribed, staying in contact with supportive people, participate as he is able in outside activities that he enjoys, and recognize the strength he shows working with goal-directed behaviors to move in a direction that supports his improving emotional health and his overall outlook into the future.  Daveon Arpino is making progress and needs to continue his work with goal-directed behaviors that support his progress thus far and his moving forward in a more positive and healthier direction into his future.  Goal review and progress/challenges noted with patient.  Next appointment within 6 weeks.   Kelleen Patee, LCSW

## 2023-12-23 ENCOUNTER — Encounter: Payer: Self-pay | Admitting: Internal Medicine

## 2023-12-23 DIAGNOSIS — D485 Neoplasm of uncertain behavior of skin: Secondary | ICD-10-CM | POA: Diagnosis not present

## 2023-12-23 DIAGNOSIS — L821 Other seborrheic keratosis: Secondary | ICD-10-CM | POA: Diagnosis not present

## 2023-12-23 DIAGNOSIS — L820 Inflamed seborrheic keratosis: Secondary | ICD-10-CM | POA: Diagnosis not present

## 2023-12-23 DIAGNOSIS — L814 Other melanin hyperpigmentation: Secondary | ICD-10-CM | POA: Diagnosis not present

## 2023-12-23 DIAGNOSIS — Z85828 Personal history of other malignant neoplasm of skin: Secondary | ICD-10-CM | POA: Diagnosis not present

## 2023-12-23 DIAGNOSIS — D225 Melanocytic nevi of trunk: Secondary | ICD-10-CM | POA: Diagnosis not present

## 2023-12-23 DIAGNOSIS — L570 Actinic keratosis: Secondary | ICD-10-CM | POA: Diagnosis not present

## 2023-12-26 ENCOUNTER — Ambulatory Visit: Payer: Self-pay | Admitting: Cardiology

## 2023-12-26 IMAGING — CT CT CHEST HIGH RESOLUTION
2 of 8 series · 13 of 36 positions shown, 16 images · non-contrast
Comparison: Cardiac MR, 08/04/2021, CT chest, 02/15/2014

CLINICAL DATA: Pericardial effusion and pleural effusions reported
by prior MR, mediastinal adenopathy, history of Hodgkin's disease,
status post chemotherapy and radiation * Tracking Code: BO *



[Series 5: chest 2.00 br36 s3 cor soft · coronal · 0.63mm/px · 3 of 131 slices shown]
[im 27/131  lung]
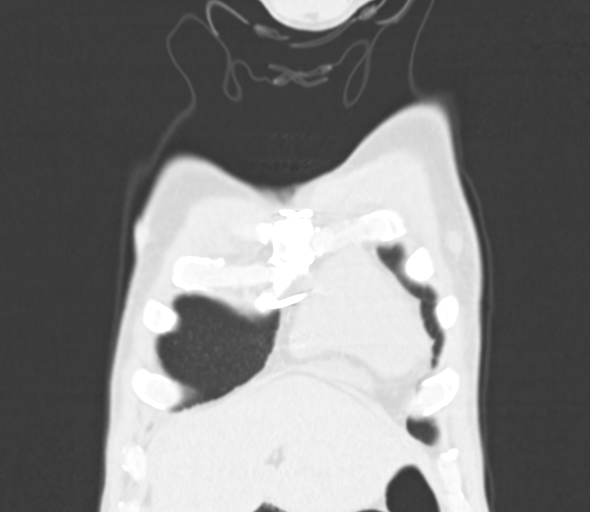
[im 53/131  lung]
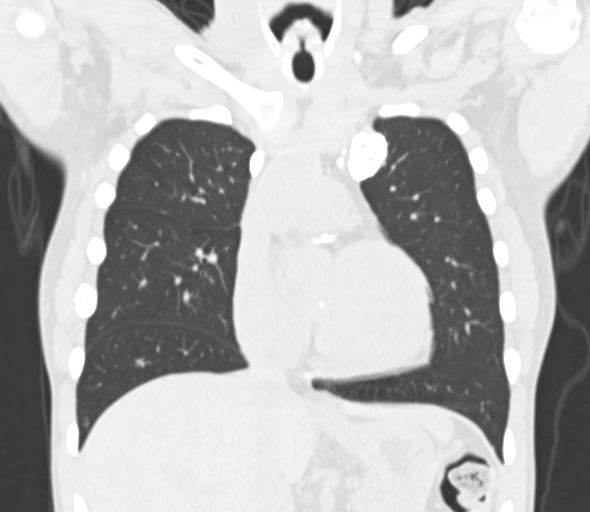
[im 79/131  lung]
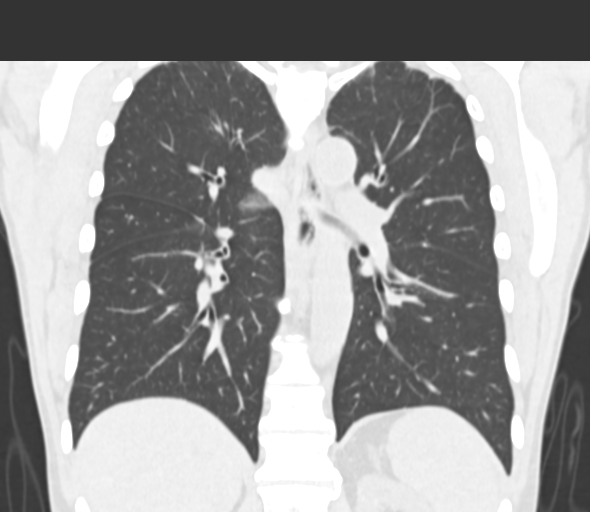

[Series 12: chest 1.00 br60 s3 high res thins 1x1 mm · axial · 0.63mm/px · z∈[+1537,+1807]mm · 10 of 324 slices shown, 13 images]
[im 27/324  mediastinal]
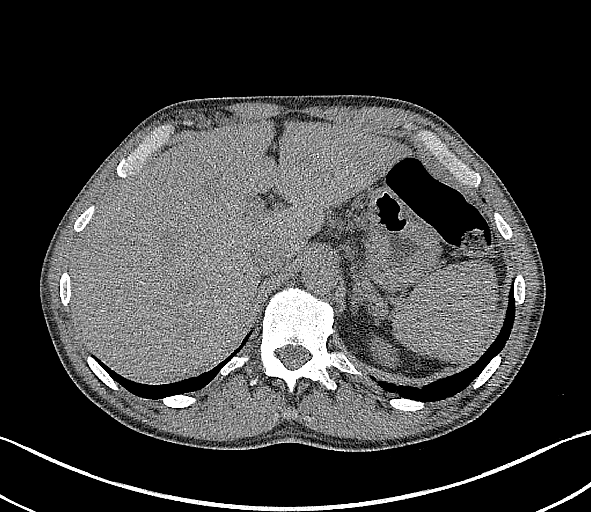
[im 27/324  lung]
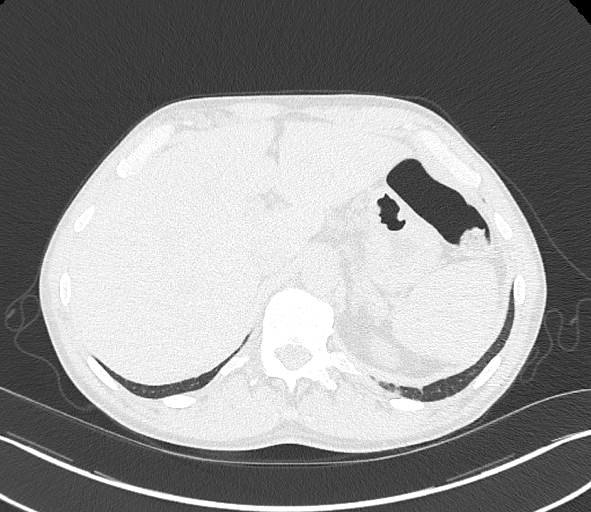
[im 54/324  lung]
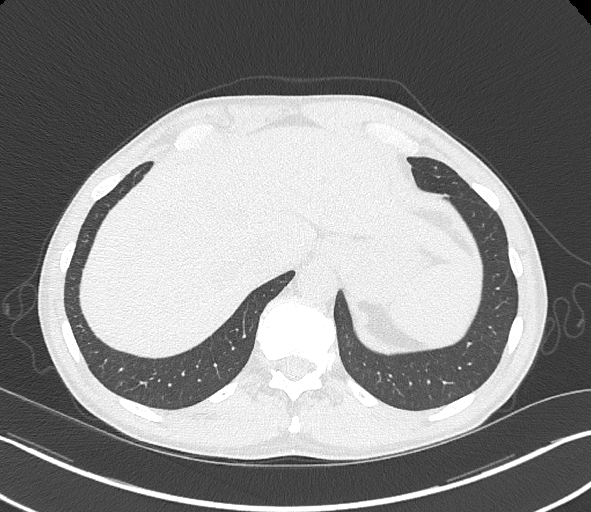
[im 81/324  lung]
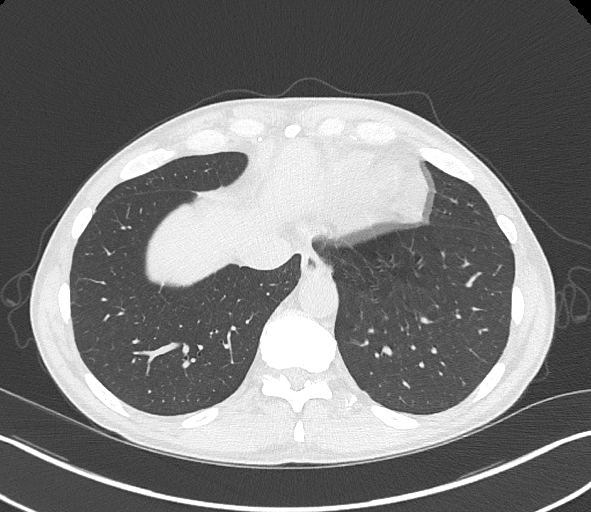
[im 108/324  lung]
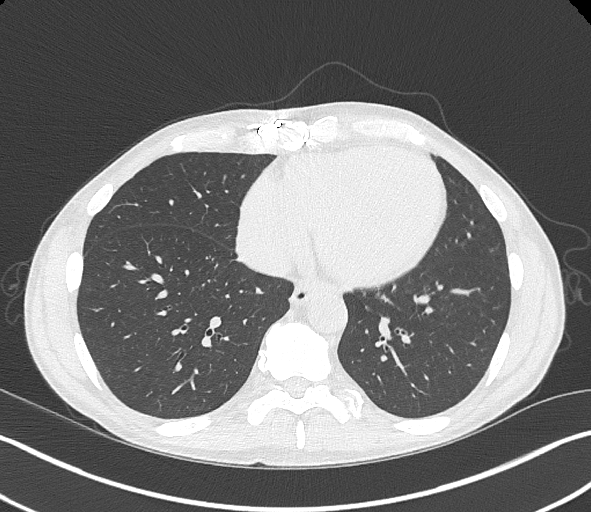
[im 135/324  mediastinal]
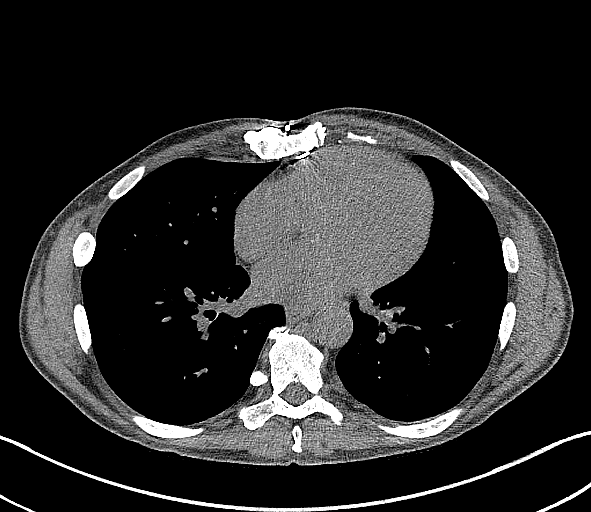
[im 135/324  lung]
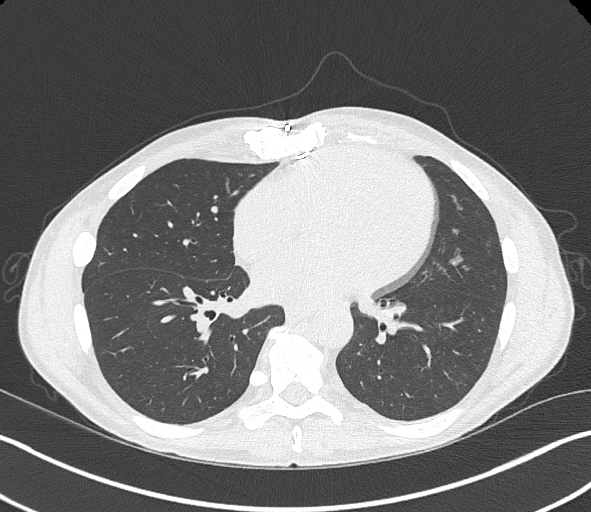
[im 189/324  lung]
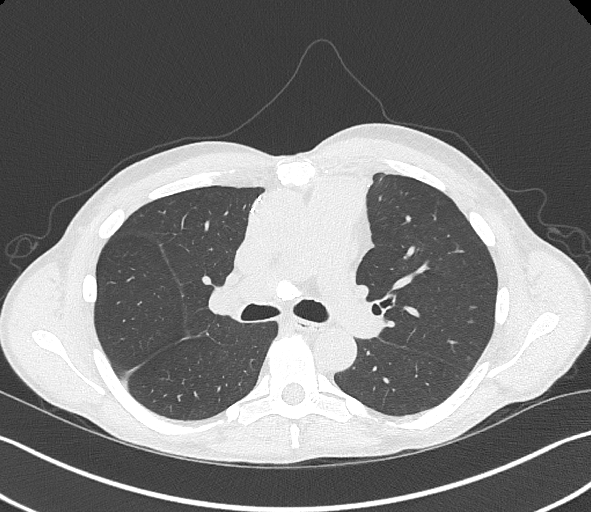
[im 216/324  lung]
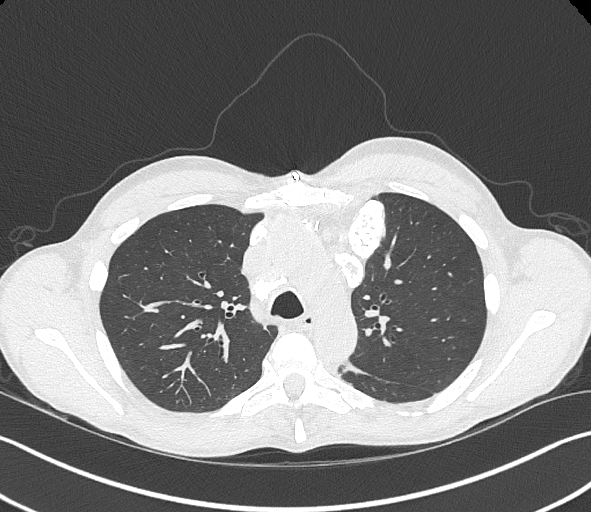
[im 243/324  lung]
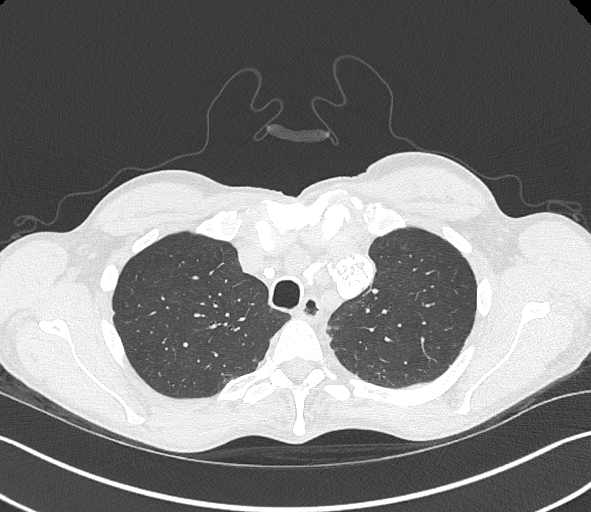
[im 270/324  mediastinal]
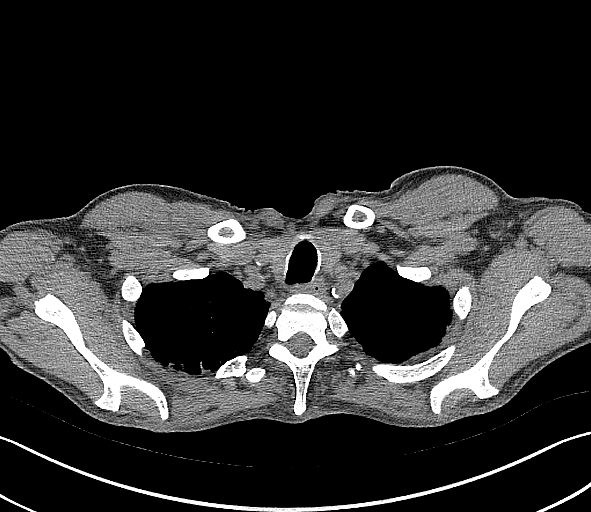
[im 270/324  lung]
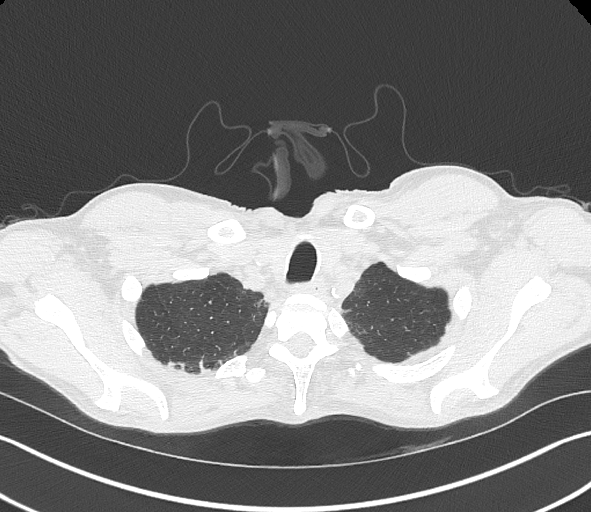
[im 297/324  lung]
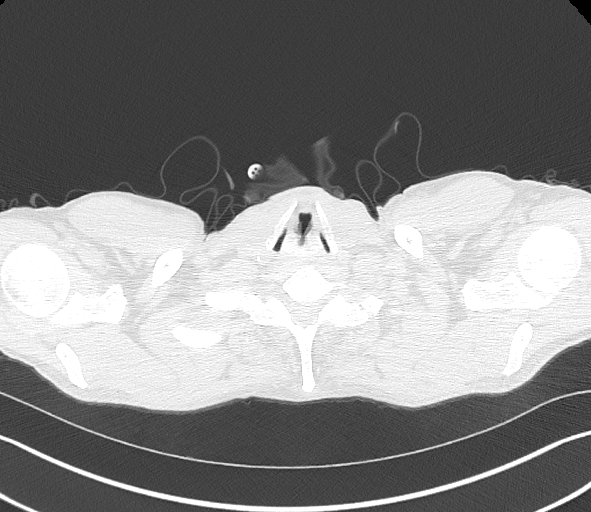

[13 of 36 positions shown; findings below may reference images not displayed]

FINDINGS: Cardiovascular: Aortic atherosclerosis. Normal heart size.
Three-vessel coronary artery calcifications status post median
sternotomy and CABG. No pericardial effusion.

Mediastinum/Nodes: Multiple large, coarsely calcified mediastinal
lymph nodes, unchanged compared to prior examination dated
02/15/2014. No enlarged noncalcified mediastinal or hilar lymph
nodes. Thyroid gland, trachea, and esophagus demonstrate no
significant findings.

Lungs/Pleura: No significant air trapping on expiratory phase
imaging. No pleural effusion or pneumothorax.

Upper Abdomen: No acute abnormality.

Musculoskeletal: No chest wall abnormality. No suspicious osseous
lesions identified.
IMPRESSION: 1. Pleural effusions and pericardial effusion identified by prior
cardiac MR are resolved.
2. Multiple large, coarsely calcified mediastinal lymph nodes,
unchanged compared to prior examination dated 02/15/2014 and in
keeping with treated Hodgkin's disease. No enlarged noncalcified
mediastinal or hilar lymph nodes.
3. No evidence of fibrotic interstitial lung disease.
4. Coronary artery disease.

Aortic Atherosclerosis ([id number]).

## 2024-01-02 ENCOUNTER — Other Ambulatory Visit: Payer: Self-pay

## 2024-01-02 ENCOUNTER — Encounter: Payer: Self-pay | Admitting: Family Medicine

## 2024-01-02 MED ORDER — LEVOTHYROXINE SODIUM 50 MCG PO TABS: 50.0000 ug | ORAL_TABLET | Freq: Every day | ORAL | 1 refills | Status: AC

## 2024-01-26 ENCOUNTER — Encounter (INDEPENDENT_AMBULATORY_CARE_PROVIDER_SITE_OTHER): Admitting: Ophthalmology

## 2024-01-26 DIAGNOSIS — H32 Chorioretinal disorders in diseases classified elsewhere: Secondary | ICD-10-CM | POA: Diagnosis not present

## 2024-01-26 DIAGNOSIS — H43813 Vitreous degeneration, bilateral: Secondary | ICD-10-CM

## 2024-01-26 DIAGNOSIS — H35033 Hypertensive retinopathy, bilateral: Secondary | ICD-10-CM

## 2024-01-26 DIAGNOSIS — H318 Other specified disorders of choroid: Secondary | ICD-10-CM | POA: Diagnosis not present

## 2024-01-26 DIAGNOSIS — H348312 Tributary (branch) retinal vein occlusion, right eye, stable: Secondary | ICD-10-CM

## 2024-01-26 DIAGNOSIS — H2513 Age-related nuclear cataract, bilateral: Secondary | ICD-10-CM

## 2024-01-26 DIAGNOSIS — B399 Histoplasmosis, unspecified: Secondary | ICD-10-CM

## 2024-01-26 DIAGNOSIS — I10 Essential (primary) hypertension: Secondary | ICD-10-CM

## 2024-01-27 ENCOUNTER — Other Ambulatory Visit (HOSPITAL_COMMUNITY): Payer: Self-pay

## 2024-01-27 ENCOUNTER — Encounter: Payer: Self-pay | Admitting: Family Medicine

## 2024-01-27 MED ORDER — SPIRONOLACTONE 25 MG PO TABS
12.5000 mg | ORAL_TABLET | Freq: Every day | ORAL | 3 refills | Status: DC
Start: 2024-01-27 — End: 2024-05-01

## 2024-01-27 NOTE — Telephone Encounter (Signed)
 Patient called in requesting refill of Spironolactone  12.5mg  once daily  Per last OV note - Continue spiro 12.5 daily    This has been refilled.

## 2024-01-31 NOTE — Progress Notes (Signed)
 Remote ICD transmission.

## 2024-02-14 ENCOUNTER — Ambulatory Visit: Admitting: Psychiatry

## 2024-02-14 DIAGNOSIS — F411 Generalized anxiety disorder: Secondary | ICD-10-CM

## 2024-02-14 DIAGNOSIS — G4733 Obstructive sleep apnea (adult) (pediatric): Secondary | ICD-10-CM | POA: Diagnosis not present

## 2024-02-14 NOTE — Progress Notes (Signed)
 [facility] Counselor/Therapist Progress Note  Patient ID: Shane [name], MRN: [id number],    Date: 02/14/2024  Time Spent: 48 minutes   Treatment Type: Individual Therapy  Reported Symptoms: anxiety mostly related to family and upcoming retirement next July, depression decreased    Mental Status Exam:  Appearance:   Casual     Behavior:  Appropriate, Sharing, and Motivated  Motor:  Normal  Speech/Language:   Clear and Coherent  Affect:  anxious  Mood:  anxious and some depression  Thought process:  goal directed  Thought content:    WNL  Sensory/Perceptual disturbances:    WNL  Orientation:  oriented to person, place, time/date, situation, day of week, month of year, year, and stated date of Aug. 5, 2025  Attention:  Good  Concentration:  Good  Memory:  WNL  Fund of knowledge:   Good  Insight:    Good  Judgment:   Good  Impulse Control:  Good   Risk Assessment: Danger to Self:  No Self-injurious Behavior: No Danger to Others: No Duty to Warn:no Physical Aggression / Violence:No  Access to Firearms a concern: No  Gang Involvement:No   Subjective: Patient today working on a juggling act of issues at work and personal life. Needed to talk today about lack of relationship with adult son, age 53. Patient feeling hurt but not dwelling on it as he feels adult son will change in time and allow some contact between the two of them. Retirement pending and is expecting to follow through in 01/2025. Wife doing better with her social anxiety.  Patient feeling some emotions regarding his own upcoming retirement as he realizes he is getting closer to that time.  Processed some of this at length today in session, but otherwise feels that he has done well with his treatment goals so far and hoping to maintain the progress he has made.  We discussed this further today and agreed that we would meet again in approximately 5/6 months and see how he is doing at that point and then  make a determination whether he needs to remain in treatment or not.  Encouraged to follow through on the progress he has made and we both agreed to meeting again within that 5/6 months time.  He is concerned and wants to make sure he maintains progress made, which I fully supported.  May use journaling as a tool in between sessions and knows that he can call our office if he needs to do so.   Interventions: Cognitive Behavioral Therapy, Solution-Oriented/Positive Psychology, and Ego-Supportive Long Term Goal: Enhance the ability to handle effectively his depression and the full variety of life's anxieties.  Short Term Goal:  Verbalize an understanding of how thoughts, physical feelings, and behavioral actions contribute to anxiety and anger and its treatment. Strategy:  Patient will work to recognize and interrupt triggers to his anxious or depressive thoughts  so as to replace with positive, more reality-based thoughts, which lead to more positive actions/behaviors.   Diagnosis:   ICD-10-CM   1. Generalized anxiety disorder  F41.1      Plan: Patient in session today reporting some anxiety however it is definitely decreased and he is noticing more progress himself especially around treatment goal behaviors.  As noted above we are planning to hold off for few months and patient be seen again in approximately 5/6 months.  He knows he can call in the meantime if needed and he preferred to keep another appointment  off in the distance just to make sure he feels emotionally stable and can maintain this going forward especially into retirement.  Anxiety much less and in some cases does not experience anxiety in situations where he normally would have. Encouraged patient in his practice of more positive and self affirming behaviors as noted in sessions including: Being more consistent in his use of positive self talk, remaining focused in the present and on the things he can control versus what he cannot  control, use of deep breathing exercises to help with anxiety, using active listening skills with his wife and others, using active listening skills with his wife and others, staying on his medication as prescribed, remaining in contact with supportive people, participate as he is able and outside activities that he enjoys, and recognize the strength he shows working with goal-directed behaviors to keep moving in a direction that supports his improved emotional health and his overall outlook into the future.  Shane [name] has made progress and will continue working with goal-directed behaviors that support this progress thus far and as he moves forward in a more positive and healthier direction into the future.  Goal review and progress/challenges noted with patient.  Will see again in approximately 5/6 months and if he is still doing well, we agreed we could discharge him from therapy.   Barnie Bunde, LCSW

## 2024-02-17 ENCOUNTER — Other Ambulatory Visit (HOSPITAL_COMMUNITY): Payer: Self-pay

## 2024-02-17 ENCOUNTER — Other Ambulatory Visit: Payer: Self-pay

## 2024-02-17 MED ORDER — LOSARTAN POTASSIUM 25 MG PO TABS
25.0000 mg | ORAL_TABLET | Freq: Every day | ORAL | 2 refills | Status: DC
  Filled 2024-02-17 – 2024-03-20 (×3): qty 90, 90d supply, fill #0

## 2024-03-01 ENCOUNTER — Other Ambulatory Visit: Payer: Self-pay

## 2024-03-01 ENCOUNTER — Other Ambulatory Visit (HOSPITAL_COMMUNITY): Payer: Self-pay | Admitting: Cardiology

## 2024-03-01 NOTE — Telephone Encounter (Signed)
 Opened in error

## 2024-03-16 ENCOUNTER — Ambulatory Visit: Payer: BC Managed Care – PPO

## 2024-03-16 DIAGNOSIS — I255 Ischemic cardiomyopathy: Secondary | ICD-10-CM | POA: Diagnosis not present

## 2024-03-16 LAB — CUP PACEART REMOTE DEVICE CHECK
Battery Remaining Longevity: 152 mo
Battery Voltage: 3 V
Brady Statistic RV Percent Paced: 0.02 %
Date Time Interrogation Session: 20250904204747
HighPow Impedance: 61 Ohm
Implantable Lead Connection Status: 753985
Implantable Lead Implant Date: 20240228
Implantable Lead Location: 753860
Implantable Pulse Generator Implant Date: 20240228
Lead Channel Impedance Value: 247 Ohm
Lead Channel Impedance Value: 323 Ohm
Lead Channel Pacing Threshold Amplitude: 0.75 V
Lead Channel Pacing Threshold Pulse Width: 0.4 ms
Lead Channel Sensing Intrinsic Amplitude: 11.1 mV
Lead Channel Setting Pacing Amplitude: 2 V
Lead Channel Setting Pacing Pulse Width: 0.4 ms
Lead Channel Setting Sensing Sensitivity: 0.3 mV
Zone Setting Status: 755011

## 2024-03-18 ENCOUNTER — Ambulatory Visit: Payer: Self-pay | Admitting: Cardiology

## 2024-03-20 ENCOUNTER — Other Ambulatory Visit: Payer: Self-pay

## 2024-03-20 ENCOUNTER — Other Ambulatory Visit (HOSPITAL_COMMUNITY): Payer: Self-pay

## 2024-03-21 ENCOUNTER — Encounter: Payer: Self-pay | Admitting: Pharmacist

## 2024-03-21 ENCOUNTER — Other Ambulatory Visit: Payer: Self-pay

## 2024-03-24 NOTE — Progress Notes (Signed)
Remote ICD Transmission.

## 2024-03-26 ENCOUNTER — Other Ambulatory Visit: Payer: Self-pay

## 2024-03-29 ENCOUNTER — Ambulatory Visit (INDEPENDENT_AMBULATORY_CARE_PROVIDER_SITE_OTHER)

## 2024-03-29 DIAGNOSIS — Z23 Encounter for immunization: Secondary | ICD-10-CM

## 2024-04-17 ENCOUNTER — Telehealth: Admitting: Adult Health

## 2024-04-17 ENCOUNTER — Encounter: Payer: Self-pay | Admitting: Adult Health

## 2024-04-17 DIAGNOSIS — G4700 Insomnia, unspecified: Secondary | ICD-10-CM | POA: Diagnosis not present

## 2024-04-17 DIAGNOSIS — F39 Unspecified mood [affective] disorder: Secondary | ICD-10-CM | POA: Diagnosis not present

## 2024-04-17 DIAGNOSIS — F410 Panic disorder [episodic paroxysmal anxiety] without agoraphobia: Secondary | ICD-10-CM

## 2024-04-17 DIAGNOSIS — F5105 Insomnia due to other mental disorder: Secondary | ICD-10-CM

## 2024-04-17 DIAGNOSIS — F411 Generalized anxiety disorder: Secondary | ICD-10-CM | POA: Diagnosis not present

## 2024-04-17 MED ORDER — ESCITALOPRAM OXALATE 20 MG PO TABS: 20.0000 mg | ORAL_TABLET | Freq: Every day | ORAL | 1 refills | Status: AC

## 2024-04-17 MED ORDER — ARIPIPRAZOLE 5 MG PO TABS: 5.0000 mg | ORAL_TABLET | Freq: Every day | ORAL | 1 refills | Status: AC

## 2024-04-17 NOTE — Progress Notes (Signed)
 Shane [name] [id number] [birth year] 58 y.o.  Virtual Visit via Video Note  I connected with pt @ on 04/17/24 at 10:00 AM EDT by a video enabled telemedicine application and verified that I am speaking with the correct person using two identifiers.   I discussed the limitations of evaluation and management by telemedicine and the availability of in person appointments. The patient expressed understanding and agreed to proceed.  I discussed the assessment and treatment plan with the patient. The patient was provided an opportunity to ask questions and all were answered. The patient agreed with the plan and demonstrated an understanding of the instructions.   The patient was advised to call back or seek an in-person evaluation if the symptoms worsen or if the condition fails to improve as anticipated.  I provided 15 minutes of non-face-to-face time during this encounter.  The patient was located at home.  The provider was located at [facility].   Angeline LOISE Sayers, NP   Subjective:   Patient ID:  Shane [name] is a 58 y.o. (DOB [birth year]) male.  Chief Complaint: No chief complaint on file.   HPI Shane [name] presents for follow-up of GAD, insomnia, mood disorder, panic attacks.   Describes mood today as ok. Pleasant. Denies tearfulness. Mood symptoms - denies anxiety, depression and irritability. Reports stable interest and motivation. Denies panic attacks. Denies worry, rumination, and over thinking. Reports mood is stable. Stating I feel like I'm doing pretty good - right on track. Feels like medications continue to work well.  Taking medications as prescribed.  Energy levels stable. Active, does not have a regular exercise routine. Enjoys some usual interests and activities. Married. Lives with wife - 4 cats - 1 dog - fostering kittens. Has 2 grown sons. Family in [place] .  Appetite adequate. Weight stable - 160 pounds.  Sleeps well most nights. Averages 6  to 7 hours. Using CPAP machine. Focus and concentration stable. Completing tasks. Managing aspects of household. Work going well [employer]. Denies SI or HI.  Denies AH or VH. Denies self harm. Denies substance use.   Review of Systems:  Review of Systems  Musculoskeletal:  Negative for gait problem.  Neurological:  Negative for tremors.  Psychiatric/Behavioral:         Please refer to HPI    Medications: I have reviewed the patient's current medications.  Current Outpatient Medications  Medication Sig Dispense Refill   acetaminophen  (TYLENOL ) 500 MG tablet Take 500 mg by mouth every 6 (six) hours as needed for headache (pain).     ARIPiprazole  (ABILIFY ) 5 MG tablet Take 1 tablet (5 mg total) by mouth daily. 90 tablet 1   aspirin  81 MG tablet Take 81 mg by mouth daily.     Bevacizumab  (AVASTIN  IV) Inject 1 Dose into the eye See admin instructions. Every six to eight weeks in left eye     carvedilol  (COREG ) 3.125 MG tablet Take 1 tablet by mouth twice daily. 180 tablet 3   ciprofloxacin  (CILOXAN ) 0.3 % ophthalmic solution Place 1 drop into the left eye 4 (four) times daily. After Eye injection every 6-8 weeks for two days 5 mL 1   escitalopram  (LEXAPRO ) 20 MG tablet Take 1 tablet (20 mg total) by mouth daily. 90 tablet 1   FARXIGA  10 MG TABS tablet TAKE 1 TABLET BY MOUTH DAILY BEFORE BREAKFAST 30 tablet 6   levothyroxine  (SYNTHROID ) 50 MCG tablet Take 1 tablet (50 mcg total) by mouth daily. 90 tablet 1  LORazepam  (ATIVAN ) 0.5 MG tablet Take 1 tablet (0.5 mg total) by mouth 2 (two) times daily as needed for anxiety. 60 tablet 2   losartan  (COZAAR ) 25 MG tablet Take 1 tablet (25 mg total) by mouth daily. 90 tablet 2   metFORMIN  (GLUCOPHAGE -XR) 500 MG 24 hr tablet Take 1 tablet (500 mg total) by mouth daily with breakfast. 90 tablet 3   rosuvastatin  (CRESTOR ) 20 MG tablet Take 1 tablet by mouth daily. 90 tablet 2   sildenafil  (VIAGRA ) 100 MG tablet Take 1 tablet (100 mg total) by  mouth daily as needed for erectile dysfunction. 10 tablet 5   spironolactone  (ALDACTONE ) 25 MG tablet Take 0.5 tablets (12.5 mg total) by mouth at bedtime. 15 tablet 3   torsemide  (DEMADEX ) 20 MG tablet Take 20 mg by mouth daily as needed (swelling).     ursodiol  (ACTIGALL ) 500 MG tablet Take 500 mg by mouth 2 (two) times daily.     No current facility-administered medications for this visit.    Medication Side Effects: None  Allergies: No Known Allergies  Past Medical History:  Diagnosis Date   CAD (coronary artery disease), s/p stent 2011 and CABG 2015 [facility])    Hodgkin disease [facility]) 1997   Hyperlipidemia    Macular degeneration    PVC's (premature ventricular contractions)     Family History  Problem Relation Age of Onset   Stroke Mother    Cancer Mother    COPD Mother    Heart disease Father    COPD Father    Heart attack Father    Heart Problems Brother        heart valve replaced at 11 years old   Heart Problems Brother        MI at age 86; CABG at age 75   Cancer Maternal Grandmother    Colon cancer Neg Hx     Social History   Socioeconomic History   Marital status: Married    Spouse name: Not on file   Number of children: Not on file   Years of education: Not on file   Highest education level: Not on file  Occupational History   Not on file  Tobacco Use   Smoking status: Never   Smokeless tobacco: Former   Tobacco comments:    a long time ago  Vaping Use   Vaping status: Never Used  Substance and Sexual Activity   Alcohol use: Yes    Alcohol/week: 3.0 standard drinks of alcohol    Types: 3 Cans of beer per week    Comment: sometimes 3 beers daily   Drug use: No   Sexual activity: Yes  Other Topics Concern   Not on file  Social History Narrative   Not on file   Social Drivers of Health   Financial Resource Strain: Not on file  Food Insecurity: Not on file  Transportation Needs: Not on file  Physical Activity: Not on file   Stress: Not on file  Social Connections: Not on file  Intimate Partner Violence: Not on file    Past Medical History, Surgical history, Social history, and Family history were reviewed and updated as appropriate.   Please see review of systems for further details on the patient's review from today.   Objective:   Physical Exam:  There were no vitals taken for this visit.  Physical Exam Constitutional:      General: Shane [name] is not in acute distress. Musculoskeletal:  General: No deformity.  Neurological:     Mental Status: Shane [name] is alert and oriented to person, place, and time.     Coordination: Coordination normal.  Psychiatric:        Attention and Perception: Attention and perception normal. Shane [name] does not perceive auditory or visual hallucinations.        Mood and Affect: Mood normal. Mood is not anxious or depressed. Affect is not labile, blunt, angry or inappropriate.        Speech: Speech normal.        Behavior: Behavior normal.        Thought Content: Thought content normal. Thought content is not paranoid or delusional. Thought content does not include homicidal or suicidal ideation. Thought content does not include homicidal or suicidal plan.        Cognition and Memory: Cognition and memory normal.        Judgment: Judgment normal.     Comments: Insight intact     Lab Review:     Component Value Date/Time   NA 138 10/13/2023 1008   NA 140 08/24/2022 1217   NA 141 12/24/2015 1051   K 4.9 10/13/2023 1008   K 4.6 12/24/2015 1051   CL 104 10/13/2023 1008   CO2 27 10/13/2023 1008   CO2 28 12/24/2015 1051   GLUCOSE 91 10/13/2023 1008   GLUCOSE 95 12/24/2015 1051   BUN 18 10/13/2023 1008   BUN 18 08/24/2022 1217   BUN 14.6 12/24/2015 1051   CREATININE 1.14 10/13/2023 1008   CREATININE 1.1 12/24/2015 1051   CALCIUM  9.6 10/13/2023 1008   CALCIUM  9.8 12/24/2015 1051   PROT 6.8 10/13/2023 1008   PROT 6.8 07/24/2021 1131   PROT 7.6 12/24/2015 1051   ALBUMIN  4.6  10/13/2023 1008   ALBUMIN  4.3 07/24/2021 1131   ALBUMIN  4.5 12/24/2015 1051   AST 22 10/13/2023 1008   AST 25 12/24/2015 1051   ALT 20 10/13/2023 1008   ALT 34 12/24/2015 1051   ALKPHOS 74 10/13/2023 1008   ALKPHOS 45 12/24/2015 1051   BILITOT 0.7 10/13/2023 1008   BILITOT 1.7 (H) 07/24/2021 1131   BILITOT 0.99 12/24/2015 1051   GFRNONAA >60 05/10/2023 1442       Component Value Date/Time   WBC 6.3 10/13/2023 1008   RBC 4.35 10/13/2023 1008   HGB 14.2 10/13/2023 1008   HGB 14.2 08/24/2022 1217   HGB 15.7 12/24/2015 1050   HCT 42.5 10/13/2023 1008   HCT 42.0 08/24/2022 1217   HCT 44.7 12/24/2015 1050   PLT 273.0 10/13/2023 1008   PLT 264 08/24/2022 1217   MCV 97.8 10/13/2023 1008   MCV 95 08/24/2022 1217   MCV 93 12/24/2015 1050   MCH 32.1 04/19/2023 1745   MCHC 33.3 10/13/2023 1008   RDW 13.5 10/13/2023 1008   RDW 12.6 08/24/2022 1217   RDW 12.9 12/24/2015 1050   LYMPHSABS 1.5 10/13/2023 1008   LYMPHSABS 1.3 12/24/2015 1050   MONOABS 0.5 10/13/2023 1008   EOSABS 0.1 10/13/2023 1008   EOSABS 0.1 12/24/2015 1050   BASOSABS 0.0 10/13/2023 1008   BASOSABS 0.0 12/24/2015 1050    No results found for: POCLITH, LITHIUM   No results found for: PHENYTOIN, PHENOBARB, VALPROATE, CBMZ   .res Assessment: Plan:    Plan:  Lexapro  20mg  daily Abilify  5mg  daily.  Ativan  0.5mg  BID - uses twice a month - none needed  Continue therapy with Marval Bunde.   RTC 6 months  15 minutes spent  dedicated to the care of this patient on the date of this encounter to include pre-visit review of records, ordering of medication, post visit documentation, and face-to-face time with the patient discussing GAD, insomnia, mood disorder, panic attacks. Discussed continuing current medication regimen.  Patient advised to contact office with any questions, adverse effects, or acute worsening in signs and symptoms.  Discussed potential benefits, risk, and side effects of  benzodiazepines to include potential risk of tolerance and dependence, as well as possible drowsiness.  Advised patient not to drive if experiencing drowsiness and to take lowest possible effective dose to minimize risk of dependence and tolerance.   Discussed potential metabolic side effects associated with atypical antipsychotics, as well as potential risk for movement side effects. Advised pt to contact office if movement side effects occur.   There are no diagnoses linked to this encounter.   Please see After Visit Summary for patient specific instructions.  Future Appointments  Date Time Provider Department Center  04/17/2024 10:00 AM Alivia Cimino, Angeline Mattocks, NP [facility] None  04/19/2024 12:50 PM Alvia Norleen BIRCH, MD [facility] None  06/15/2024  7:05 AM [facility] DEVICE REMOTES [facility]  08/16/2024 11:00 AM Sherlynn Sober, LCSW [facility] None  09/14/2024  7:05 AM [facility] DEVICE REMOTES [facility]  10/16/2024 10:30 AM Jodie Lavern CROME, MD [facility]    No orders of the defined types were placed in this encounter.     -------------------------------

## 2024-04-19 ENCOUNTER — Encounter (INDEPENDENT_AMBULATORY_CARE_PROVIDER_SITE_OTHER): Admitting: Ophthalmology

## 2024-04-19 DIAGNOSIS — H32 Chorioretinal disorders in diseases classified elsewhere: Secondary | ICD-10-CM | POA: Diagnosis not present

## 2024-04-19 DIAGNOSIS — H35033 Hypertensive retinopathy, bilateral: Secondary | ICD-10-CM

## 2024-04-19 DIAGNOSIS — H348322 Tributary (branch) retinal vein occlusion, left eye, stable: Secondary | ICD-10-CM | POA: Diagnosis not present

## 2024-04-19 DIAGNOSIS — H43813 Vitreous degeneration, bilateral: Secondary | ICD-10-CM

## 2024-04-19 DIAGNOSIS — H353221 Exudative age-related macular degeneration, left eye, with active choroidal neovascularization: Secondary | ICD-10-CM

## 2024-04-19 DIAGNOSIS — B399 Histoplasmosis, unspecified: Secondary | ICD-10-CM | POA: Diagnosis not present

## 2024-04-19 DIAGNOSIS — I10 Essential (primary) hypertension: Secondary | ICD-10-CM

## 2024-04-21 ENCOUNTER — Other Ambulatory Visit (HOSPITAL_COMMUNITY): Payer: Self-pay | Admitting: Family Medicine

## 2024-04-27 ENCOUNTER — Other Ambulatory Visit (HOSPITAL_COMMUNITY): Payer: Self-pay | Admitting: Internal Medicine

## 2024-05-03 ENCOUNTER — Other Ambulatory Visit (HOSPITAL_COMMUNITY): Payer: Self-pay | Admitting: Internal Medicine

## 2024-05-18 NOTE — Progress Notes (Signed)
 Shane [name]                                          MRN: [id number]   05/18/2024   The [facility]BCI Quality Team Specialist reviewed this patient medical record for the purposes of chart review for care gap closure. The following were reviewed: abstraction for care gap closure-kidney health evaluation for diabetes:eGFR  and uACR.    VBCI Quality Team

## 2024-06-15 ENCOUNTER — Ambulatory Visit: Payer: BC Managed Care – PPO

## 2024-06-15 DIAGNOSIS — I255 Ischemic cardiomyopathy: Secondary | ICD-10-CM

## 2024-06-18 LAB — CUP PACEART REMOTE DEVICE CHECK
Battery Remaining Longevity: 150 mo
Battery Voltage: 3 V
Brady Statistic RV Percent Paced: 0.01 %
Date Time Interrogation Session: 20251204222538
HighPow Impedance: 55 Ohm
Implantable Lead Connection Status: 753985
Implantable Lead Implant Date: 20240228
Implantable Lead Location: 753860
Implantable Pulse Generator Implant Date: 20240228
Lead Channel Impedance Value: 247 Ohm
Lead Channel Impedance Value: 304 Ohm
Lead Channel Pacing Threshold Amplitude: 0.875 V
Lead Channel Pacing Threshold Pulse Width: 0.4 ms
Lead Channel Sensing Intrinsic Amplitude: 9.8 mV
Lead Channel Setting Pacing Amplitude: 2 V
Lead Channel Setting Pacing Pulse Width: 0.4 ms
Lead Channel Setting Sensing Sensitivity: 0.3 mV
Zone Setting Status: 755011

## 2024-06-19 ENCOUNTER — Ambulatory Visit: Payer: Self-pay | Admitting: Cardiology

## 2024-06-19 NOTE — Progress Notes (Signed)
 Remote ICD Transmission

## 2024-07-11 ENCOUNTER — Telehealth: Payer: Self-pay | Admitting: Psychiatry

## 2024-07-11 NOTE — Telephone Encounter (Signed)
 Pt lvm @ 12:48a stating that [employer] can't get her Lithium for another week.  She is out of medication.  She is asking if Dr Geoffry will send it to [facility] [address].  No upcoming appt scheduled.

## 2024-07-11 NOTE — Telephone Encounter (Signed)
 This was entered under pt's husband in error.

## 2024-07-18 ENCOUNTER — Encounter: Payer: Self-pay | Admitting: Cardiovascular Disease

## 2024-07-24 ENCOUNTER — Other Ambulatory Visit: Payer: Self-pay

## 2024-07-24 ENCOUNTER — Encounter (INDEPENDENT_AMBULATORY_CARE_PROVIDER_SITE_OTHER): Admitting: Ophthalmology

## 2024-07-24 MED ORDER — SPIRONOLACTONE 25 MG PO TABS: 12.5000 mg | ORAL_TABLET | Freq: Every day | ORAL | 0 refills | Status: AC

## 2024-07-28 ENCOUNTER — Other Ambulatory Visit (HOSPITAL_COMMUNITY): Payer: Self-pay | Admitting: Internal Medicine

## 2024-07-29 ENCOUNTER — Other Ambulatory Visit: Payer: Self-pay | Admitting: Cardiovascular Disease

## 2024-07-30 ENCOUNTER — Encounter (INDEPENDENT_AMBULATORY_CARE_PROVIDER_SITE_OTHER): Admitting: Ophthalmology

## 2024-07-30 DIAGNOSIS — H353221 Exudative age-related macular degeneration, left eye, with active choroidal neovascularization: Secondary | ICD-10-CM | POA: Diagnosis not present

## 2024-07-30 DIAGNOSIS — H43813 Vitreous degeneration, bilateral: Secondary | ICD-10-CM | POA: Diagnosis not present

## 2024-07-30 DIAGNOSIS — B399 Histoplasmosis, unspecified: Secondary | ICD-10-CM | POA: Diagnosis not present

## 2024-07-30 DIAGNOSIS — H35033 Hypertensive retinopathy, bilateral: Secondary | ICD-10-CM

## 2024-07-30 DIAGNOSIS — I10 Essential (primary) hypertension: Secondary | ICD-10-CM | POA: Diagnosis not present

## 2024-07-30 DIAGNOSIS — H32 Chorioretinal disorders in diseases classified elsewhere: Secondary | ICD-10-CM | POA: Diagnosis not present

## 2024-08-16 ENCOUNTER — Ambulatory Visit (INDEPENDENT_AMBULATORY_CARE_PROVIDER_SITE_OTHER): Admitting: Psychiatry

## 2024-08-16 DIAGNOSIS — F411 Generalized anxiety disorder: Secondary | ICD-10-CM

## 2024-08-16 NOTE — Progress Notes (Signed)
 "       [facility] Counselor/Therapist Progress Note  Patient ID: Shane [name], MRN: [id number],    Date: 08/16/2024  Time Spent: 53 minutes   Treatment Type: Individual Therapy  Reported Symptoms: Anxiety decreasing, planning to retire January 25, 2025  Mental Status Exam:  Appearance:   Casual     Behavior:  Appropriate, Sharing, and Motivated  Motor:  Normal  Speech/Language:   Clear and Coherent  Affect:  anxious  Mood:  anxious  Thought process:  goal directed  Thought content:    WNL  Sensory/Perceptual disturbances:    WNL  Orientation:  oriented to person, place, time/date, situation, day of week, month of year, year, and stated date of Feb. 5, 2026  Attention:  Good  Concentration:  Good  Memory:  WNL  Fund of knowledge:   Good  Insight:    Good  Judgment:   Good  Impulse Control:  Good   Risk Assessment: Danger to Self:  No Self-injurious Behavior: No Danger to Others: No Duty to Warn:no Physical Aggression / Violence:No  Access to Firearms a concern: No  Gang Involvement:No   Subjective:  Patient today reports anxiety but I am getting better at managing my anxiety and the anxiety is decreasing. Pending retirement does create some anxiety which he processed openly in session today. Anxiety re: having a lot of time on his hands but seems to already having some ideas in mind as to things he wants to do in retirement. Knows he will be ok financially . Thinking of things he hopes to do in retirement including maybe converting to Judaism (as his wife is Jewish). Attends service with his wife. Wanting to also travel more in retirement. Processing some anxiety and strategies he can use that can help him in continuing to move forward in a positive direction, especially as he anticipates upcoming retirement.    Interventions: Cognitive Behavioral Therapy, Solution-Oriented/Positive Psychology, and Ego-Supportive Long Term Goal: Enhance the ability to handle  effectively life's challenges and anxieties. Short-term goal: Verbalize an understanding of help thoughts, physical feelings, and behavioral actions contribute to anxiety and its treatment. Strategy: Patient will work to recognize and interrupt triggers to any anxious or depressive thoughts so as to replace with positive, more reality based thoughts, which lead to more positive actions/behaviors.  He has done quite well with this and is not currently having some manageable anxious thoughts but reports very little depressive thoughts.  Overall feeling much better and we are stretching out his next appointment to be approximately 3-4 months out and he knows he can call if needed before that time.   Diagnosis:   ICD-10-CM   1. Generalized anxiety disorder  F41.1      Plan: Patient showing really good effort and motivation today in session as he shares how things have been the last few weeks and is looking towards his upcoming retirement in July 2026.  Continues good work in focusing on himself and certain changes that he is continuing to work on as well as his marriage which seems to be going in a very positive direction which is even more important to patient especially as he has into retirement in a few months.  Really good it working on his treatment goals and is noticing progress.  Will see again within approximately 3-4 months.  Some anxiety but more manageable and depression is quite decreased as he reports very little at this point.  Goal review and progress/challenges noted with patient.  Next appointment within 3-4 months.   Barnie Bunde, LCSW                   "

## 2024-08-22 ENCOUNTER — Ambulatory Visit: Admitting: Cardiovascular Disease

## 2024-10-16 ENCOUNTER — Encounter: Admitting: Family Medicine

## 2024-10-30 ENCOUNTER — Encounter (INDEPENDENT_AMBULATORY_CARE_PROVIDER_SITE_OTHER): Admitting: Ophthalmology

## 2024-12-27 ENCOUNTER — Ambulatory Visit (INDEPENDENT_AMBULATORY_CARE_PROVIDER_SITE_OTHER): Admitting: Psychiatry
